# Patient Record
Sex: Male | Born: 1965 | Race: White | Hispanic: No | Marital: Married | Smoking: Never smoker
Health system: Southern US, Community
[De-identification: ages and names within clinical notes are randomized; demographics above are authoritative.]

## PROBLEM LIST (undated history)

## (undated) DIAGNOSIS — N189 Chronic kidney disease, unspecified: Secondary | ICD-10-CM

## (undated) DIAGNOSIS — K219 Gastro-esophageal reflux disease without esophagitis: Secondary | ICD-10-CM

## (undated) DIAGNOSIS — S065X9A Traumatic subdural hemorrhage with loss of consciousness of unspecified duration, initial encounter: Secondary | ICD-10-CM

## (undated) DIAGNOSIS — I10 Essential (primary) hypertension: Secondary | ICD-10-CM

## (undated) DIAGNOSIS — IMO0001 Reserved for inherently not codable concepts without codable children: Secondary | ICD-10-CM

## (undated) DIAGNOSIS — M1990 Unspecified osteoarthritis, unspecified site: Secondary | ICD-10-CM

---

## 1989-09-17 DIAGNOSIS — S065XAA Traumatic subdural hemorrhage with loss of consciousness status unknown, initial encounter: Secondary | ICD-10-CM

## 1989-09-17 DIAGNOSIS — S065X9A Traumatic subdural hemorrhage with loss of consciousness of unspecified duration, initial encounter: Secondary | ICD-10-CM

## 1989-09-17 HISTORY — DX: Traumatic subdural hemorrhage with loss of consciousness status unknown, initial encounter: S06.5XAA

## 1989-09-17 HISTORY — DX: Traumatic subdural hemorrhage with loss of consciousness of unspecified duration, initial encounter: S06.5X9A

## 1990-09-17 HISTORY — PX: OTHER SURGICAL HISTORY: SHX169

## 2007-10-31 ENCOUNTER — Ambulatory Visit: Payer: Self-pay | Admitting: Podiatry

## 2010-11-06 ENCOUNTER — Ambulatory Visit: Payer: Self-pay | Admitting: Unknown Physician Specialty

## 2012-01-01 ENCOUNTER — Ambulatory Visit: Payer: Self-pay | Admitting: Unknown Physician Specialty

## 2013-07-22 ENCOUNTER — Ambulatory Visit: Payer: Self-pay | Admitting: Family Medicine

## 2013-07-28 ENCOUNTER — Encounter: Payer: Self-pay | Admitting: Family Medicine

## 2013-08-05 ENCOUNTER — Other Ambulatory Visit (HOSPITAL_COMMUNITY): Payer: Self-pay | Admitting: Neurosurgery

## 2013-08-05 DIAGNOSIS — M47817 Spondylosis without myelopathy or radiculopathy, lumbosacral region: Secondary | ICD-10-CM

## 2013-08-06 ENCOUNTER — Ambulatory Visit (HOSPITAL_COMMUNITY)
Admission: RE | Admit: 2013-08-06 | Discharge: 2013-08-06 | Disposition: A | Discharge status: Home / Self Care | Payer: 59 | Source: Ambulatory Visit | Attending: Neurosurgery | Admitting: Neurosurgery

## 2013-08-06 DIAGNOSIS — M5126 Other intervertebral disc displacement, lumbar region: Secondary | ICD-10-CM | POA: Insufficient documentation

## 2013-08-06 DIAGNOSIS — M545 Low back pain, unspecified: Secondary | ICD-10-CM | POA: Insufficient documentation

## 2013-08-06 DIAGNOSIS — M5137 Other intervertebral disc degeneration, lumbosacral region: Secondary | ICD-10-CM | POA: Insufficient documentation

## 2013-08-06 DIAGNOSIS — M47817 Spondylosis without myelopathy or radiculopathy, lumbosacral region: Secondary | ICD-10-CM | POA: Insufficient documentation

## 2013-08-06 DIAGNOSIS — M51379 Other intervertebral disc degeneration, lumbosacral region without mention of lumbar back pain or lower extremity pain: Secondary | ICD-10-CM | POA: Insufficient documentation

## 2013-08-06 DIAGNOSIS — M79609 Pain in unspecified limb: Secondary | ICD-10-CM | POA: Insufficient documentation

## 2013-08-17 ENCOUNTER — Encounter: Payer: Self-pay | Admitting: Family Medicine

## 2013-08-18 ENCOUNTER — Other Ambulatory Visit (HOSPITAL_COMMUNITY): Payer: Self-pay

## 2014-09-17 DIAGNOSIS — N189 Chronic kidney disease, unspecified: Secondary | ICD-10-CM

## 2014-09-17 HISTORY — DX: Chronic kidney disease, unspecified: N18.9

## 2014-09-25 ENCOUNTER — Ambulatory Visit: Payer: Self-pay

## 2015-03-14 ENCOUNTER — Encounter: Payer: Self-pay | Admitting: Emergency Medicine

## 2015-03-14 ENCOUNTER — Ambulatory Visit
Admission: EM | Admit: 2015-03-14 | Discharge: 2015-03-14 | Disposition: A | Discharge status: Home / Self Care | Payer: 59 | Attending: Internal Medicine | Admitting: Internal Medicine

## 2015-03-14 DIAGNOSIS — J069 Acute upper respiratory infection, unspecified: Secondary | ICD-10-CM

## 2015-03-14 DIAGNOSIS — J029 Acute pharyngitis, unspecified: Secondary | ICD-10-CM | POA: Diagnosis present

## 2015-03-14 HISTORY — DX: Reserved for inherently not codable concepts without codable children: IMO0001

## 2015-03-14 HISTORY — DX: Gastro-esophageal reflux disease without esophagitis: K21.9

## 2015-03-14 LAB — RAPID STREP SCREEN (MED CTR MEBANE ONLY): Streptococcus, Group A Screen (Direct): NEGATIVE

## 2015-03-14 NOTE — Discharge Instructions (Signed)
Recheck for new fever>100.5 or if symptoms are not gradually improving over the next 10-14 days.  Upper Respiratory Infection, Adult An upper respiratory infection (URI) is also sometimes known as the common cold. The upper respiratory tract includes the nose, sinuses, throat, trachea, and bronchi. Bronchi are the airways leading to the lungs. Most people improve within 1 week, but symptoms can last up to 2 weeks. A residual cough may last even longer.  CAUSES Many different viruses can infect the tissues lining the upper respiratory tract. The tissues become irritated and inflamed and often become very moist. Mucus production is also common. A cold is contagious. You can easily spread the virus to others by oral contact. This includes kissing, sharing a glass, coughing, or sneezing. Touching your mouth or nose and then touching a surface, which is then touched by another person, can also spread the virus. SYMPTOMS  Symptoms typically develop 1 to 3 days after you come in contact with a cold virus. Symptoms vary from person to person. They may include:  Runny nose.  Sneezing.  Nasal congestion.  Sinus irritation.  Sore throat.  Loss of voice (laryngitis).  Cough.  Fatigue.  Muscle aches.  Loss of appetite.  Headache.  Low-grade fever. DIAGNOSIS  You might diagnose your own cold based on familiar symptoms, since most people get a cold 2 to 3 times a year. Your caregiver can confirm this based on your exam. Most importantly, your caregiver can check that your symptoms are not due to another disease such as strep throat, sinusitis, pneumonia, asthma, or epiglottitis. Blood tests, throat tests, and X-rays are not necessary to diagnose a common cold, but they may sometimes be helpful in excluding other more serious diseases. Your caregiver will decide if any further tests are required. RISKS AND COMPLICATIONS  You may be at risk for a more severe case of the common cold if you smoke  cigarettes, have chronic heart disease (such as heart failure) or lung disease (such as asthma), or if you have a weakened immune system. The very young and very old are also at risk for more serious infections. Bacterial sinusitis, middle ear infections, and bacterial pneumonia can complicate the common cold. The common cold can worsen asthma and chronic obstructive pulmonary disease (COPD). Sometimes, these complications can require emergency medical care and may be life-threatening. PREVENTION  The best way to protect against getting a cold is to practice good hygiene. Avoid oral or hand contact with people with cold symptoms. Wash your hands often if contact occurs. There is no clear evidence that vitamin C, vitamin E, echinacea, or exercise reduces the chance of developing a cold. However, it is always recommended to get plenty of rest and practice good nutrition. TREATMENT  Treatment is directed at relieving symptoms. There is no cure. Antibiotics are not effective, because the infection is caused by a virus, not by bacteria. Treatment may include:  Increased fluid intake. Sports drinks offer valuable electrolytes, sugars, and fluids.  Breathing heated mist or steam (vaporizer or shower).  Eating chicken soup or other clear broths, and maintaining good nutrition.  Getting plenty of rest.  Using gargles or lozenges for comfort.  Controlling fevers with ibuprofen or acetaminophen as directed by your caregiver.  Increasing usage of your inhaler if you have asthma. Zinc gel and zinc lozenges, taken in the first 24 hours of the common cold, can shorten the duration and lessen the severity of symptoms. Pain medicines may help with fever, muscle aches, and  throat pain. A variety of non-prescription medicines are available to treat congestion and runny nose. Your caregiver can make recommendations and may suggest nasal or lung inhalers for other symptoms.  HOME CARE INSTRUCTIONS   Only take  over-the-counter or prescription medicines for pain, discomfort, or fever as directed by your caregiver.  Use a warm mist humidifier or inhale steam from a shower to increase air moisture. This may keep secretions moist and make it easier to breathe.  Drink enough water and fluids to keep your urine clear or pale yellow.  Rest as needed.  Return to work when your temperature has returned to normal or as your caregiver advises. You may need to stay home longer to avoid infecting others. You can also use a face mask and careful hand washing to prevent spread of the virus. SEEK MEDICAL CARE IF:   After the first few days, you feel you are getting worse rather than better.  You need your caregiver's advice about medicines to control symptoms.  You develop chills, worsening shortness of breath, or brown or red sputum. These may be signs of pneumonia.  You develop yellow or brown nasal discharge or pain in the face, especially when you bend forward. These may be signs of sinusitis.  You develop a fever, swollen neck glands, pain with swallowing, or white areas in the back of your throat. These may be signs of strep throat. SEEK IMMEDIATE MEDICAL CARE IF:   You have a fever.  You develop severe or persistent headache, ear pain, sinus pain, or chest pain.  You develop wheezing, a prolonged cough, cough up blood, or have a change in your usual mucus (if you have chronic lung disease).  You develop sore muscles or a stiff neck. Document Released: 02/27/2001 Document Revised: 11/26/2011 Document Reviewed: 12/09/2013 [facility] Patient Information 2015 [place], [place]. This information is not intended to replace advice given to you by your health care provider. Make sure you discuss any questions you have with your health care provider.

## 2015-03-14 NOTE — ED Notes (Signed)
Sore throat, nausea, bodyache started today

## 2015-03-14 NOTE — ED Provider Notes (Signed)
CSN: [id number]     Arrival date & time 03/14/15  1704 History   First MD Initiated Contact with Patient 03/14/15 1807     Chief Complaint  Patient presents with  . Sore Throat   HPI   Patient is a 49 year old gentleman with the onset of malaise and mild sore throat today. When his kids has had a respiratory infection. Afebrile. Mild nausea. Some achiness. No significant runny/congested nose, no cough. Not vomiting.  Past Medical History  Diagnosis Date  . Reflux    Past Surgical History  Procedure Laterality Date  . Back surgery     History reviewed. No pertinent family history. History  Substance Use Topics  . Smoking status: Former Research scientist (life sciences)  . Smokeless tobacco: Never Used  . Alcohol Use: Yes     Comment: occasional    Review of Systems  All other systems reviewed and are negative.   Allergies  Review of patient's allergies indicates no known allergies.  Home Medications   Prior to Admission medications   Medication Sig Start Date End Date Taking? Authorizing Provider  omeprazole (PRILOSEC) 40 MG capsule Take 40 mg by mouth daily.   Yes Historical Provider, MD   BP 116/68 mmHg  Pulse 79  Temp(Src) 98.3 F (36.8 C) (Tympanic)  Resp 16  Ht 6\' 1"  (1.854 m)  Wt 190 lb (86.183 kg)  BMI 25.07 kg/m2  SpO2 97% Physical Exam  Constitutional: He is oriented to person, place, and time. No distress.  Alert, nicely groomed  HENT:  Head: Atraumatic.  Bilateral TMs mildly dull, right TM slightly red. Moderate nasal congestion on the left, marked on the right. Mucopurulent material present on the right. Throat red.  Eyes:  Conjugate gaze, no eye redness/drainage  Neck: Neck supple.  Cardiovascular: Normal rate and regular rhythm.   Pulmonary/Chest: No respiratory distress. He has no wheezes. He has no rales.  Lungs clear, symmetric breath sounds  Abdominal: Soft. He exhibits no distension.  Musculoskeletal: Normal range of motion.  Neurological: He is alert and  oriented to person, place, and time.  Skin: Skin is warm and dry.  No cyanosis  Nursing note and vitals reviewed.   ED Course  Procedures    Results for orders placed or performed during the hospital encounter of 03/14/15  Rapid strep screen  Result Value Ref Range   Streptococcus, Group A Screen (Direct) NEGATIVE NEGATIVE   Throat culture is pending.     MDM   1. Upper respiratory infection    Await throat culture results. Recheck for new fever greater than 100.5, increasing phlegm production, persistence of symptoms for greater than 10 days.    Sherlene Shams, MD 03/14/15 2128

## 2015-03-17 LAB — CULTURE, GROUP A STREP (THRC)

## 2015-05-16 ENCOUNTER — Emergency Department: Payer: 59

## 2015-05-16 ENCOUNTER — Inpatient Hospital Stay
Admission: EM | Admit: 2015-05-16 | Discharge: 2015-05-18 | DRG: 700 | Disposition: A | Discharge status: Home / Self Care | Payer: 59 | Attending: Internal Medicine | Admitting: Internal Medicine

## 2015-05-16 ENCOUNTER — Inpatient Hospital Stay
Admit: 2015-05-16 | Discharge: 2015-05-16 | Disposition: A | Discharge status: Home / Self Care | Payer: 59 | Attending: Specialist | Admitting: Specialist

## 2015-05-16 ENCOUNTER — Encounter: Payer: Self-pay | Admitting: Emergency Medicine

## 2015-05-16 DIAGNOSIS — D72829 Elevated white blood cell count, unspecified: Secondary | ICD-10-CM | POA: Diagnosis present

## 2015-05-16 DIAGNOSIS — K219 Gastro-esophageal reflux disease without esophagitis: Secondary | ICD-10-CM | POA: Diagnosis present

## 2015-05-16 DIAGNOSIS — R61 Generalized hyperhidrosis: Secondary | ICD-10-CM | POA: Diagnosis present

## 2015-05-16 DIAGNOSIS — Z79899 Other long term (current) drug therapy: Secondary | ICD-10-CM

## 2015-05-16 DIAGNOSIS — N280 Ischemia and infarction of kidney: Principal | ICD-10-CM | POA: Diagnosis present

## 2015-05-16 DIAGNOSIS — Z809 Family history of malignant neoplasm, unspecified: Secondary | ICD-10-CM | POA: Diagnosis not present

## 2015-05-16 DIAGNOSIS — Z9889 Other specified postprocedural states: Secondary | ICD-10-CM | POA: Diagnosis not present

## 2015-05-16 HISTORY — DX: Gastro-esophageal reflux disease without esophagitis: K21.9

## 2015-05-16 LAB — COMPREHENSIVE METABOLIC PANEL
ALT: 19 U/L (ref 17–63)
ANION GAP: 8 (ref 5–15)
AST: 22 U/L (ref 15–41)
Albumin: 4.4 g/dL (ref 3.5–5.0)
Alkaline Phosphatase: 66 U/L (ref 38–126)
BILIRUBIN TOTAL: 1 mg/dL (ref 0.3–1.2)
BUN: 23 mg/dL — AB (ref 6–20)
CHLORIDE: 102 mmol/L (ref 101–111)
CO2: 25 mmol/L (ref 22–32)
Calcium: 9.1 mg/dL (ref 8.9–10.3)
Creatinine, Ser: 1.33 mg/dL — ABNORMAL HIGH (ref 0.61–1.24)
Glucose, Bld: 117 mg/dL — ABNORMAL HIGH (ref 65–99)
POTASSIUM: 3.7 mmol/L (ref 3.5–5.1)
Sodium: 135 mmol/L (ref 135–145)
TOTAL PROTEIN: 6.8 g/dL (ref 6.5–8.1)

## 2015-05-16 LAB — CBC WITH DIFFERENTIAL/PLATELET
BASOS ABS: 0.1 10*3/uL (ref 0–0.1)
Basophils Relative: 1 %
EOS PCT: 0 %
Eosinophils Absolute: 0 10*3/uL (ref 0–0.7)
HCT: 46.2 % (ref 40.0–52.0)
Hemoglobin: 15.6 g/dL (ref 13.0–18.0)
LYMPHS PCT: 10 %
Lymphs Abs: 1.3 10*3/uL (ref 1.0–3.6)
MCH: 31.4 pg (ref 26.0–34.0)
MCHC: 33.7 g/dL (ref 32.0–36.0)
MCV: 93.2 fL (ref 80.0–100.0)
MONO ABS: 0.6 10*3/uL (ref 0.2–1.0)
MONOS PCT: 5 %
Neutro Abs: 11.1 10*3/uL — ABNORMAL HIGH (ref 1.4–6.5)
Neutrophils Relative %: 84 %
PLATELETS: 204 10*3/uL (ref 150–440)
RBC: 4.96 MIL/uL (ref 4.40–5.90)
RDW: 11.8 % (ref 11.5–14.5)
WBC: 13.1 10*3/uL — ABNORMAL HIGH (ref 3.8–10.6)

## 2015-05-16 LAB — URINALYSIS COMPLETE WITH MICROSCOPIC (ARMC ONLY)
BILIRUBIN URINE: NEGATIVE
Bacteria, UA: NONE SEEN
Glucose, UA: NEGATIVE mg/dL
HGB URINE DIPSTICK: NEGATIVE
KETONES UR: NEGATIVE mg/dL
LEUKOCYTES UA: NEGATIVE
NITRITE: NEGATIVE
PH: 7 (ref 5.0–8.0)
PROTEIN: NEGATIVE mg/dL
SPECIFIC GRAVITY, URINE: 1.019 (ref 1.005–1.030)
Squamous Epithelial / LPF: NONE SEEN

## 2015-05-16 LAB — LIPASE, BLOOD: LIPASE: 37 U/L (ref 22–51)

## 2015-05-16 LAB — HEPARIN LEVEL (UNFRACTIONATED): Heparin Unfractionated: 0.3 IU/mL (ref 0.30–0.70)

## 2015-05-16 LAB — PROTIME-INR
INR: 0.98
Prothrombin Time: 13.2 seconds (ref 11.4–15.0)

## 2015-05-16 LAB — APTT: aPTT: 29 seconds (ref 24–36)

## 2015-05-16 MED ORDER — ONDANSETRON HCL 4 MG/2ML IJ SOLN
4.0000 mg | Freq: Once | INTRAMUSCULAR | Status: AC
  Administered 2015-05-16: 4 mg via INTRAVENOUS

## 2015-05-16 MED ORDER — ACETAMINOPHEN 325 MG PO TABS: 650.0000 mg | ORAL_TABLET | Freq: Four times a day (QID) | ORAL | Status: DC | PRN

## 2015-05-16 MED ORDER — PANTOPRAZOLE SODIUM 40 MG PO TBEC
40.0000 mg | DELAYED_RELEASE_TABLET | Freq: Every day | ORAL | Status: DC
  Administered 2015-05-17: 40 mg via ORAL
  Filled 2015-05-16 (×2): qty 1

## 2015-05-16 MED ORDER — MORPHINE SULFATE (PF) 4 MG/ML IV SOLN
4.0000 mg | Freq: Once | INTRAVENOUS | Status: AC
  Administered 2015-05-16: 4 mg via INTRAVENOUS
  Filled 2015-05-16: qty 1

## 2015-05-16 MED ORDER — ONDANSETRON HCL 4 MG/2ML IJ SOLN
4.0000 mg | Freq: Four times a day (QID) | INTRAMUSCULAR | Status: DC | PRN
  Administered 2015-05-17: 4 mg via INTRAVENOUS
  Filled 2015-05-16: qty 2

## 2015-05-16 MED ORDER — ACETAMINOPHEN 650 MG RE SUPP: 650.0000 mg | Freq: Four times a day (QID) | RECTAL | Status: DC | PRN

## 2015-05-16 MED ORDER — SODIUM CHLORIDE 0.9 % IV SOLN
INTRAVENOUS | Status: DC
  Administered 2015-05-16 – 2015-05-18 (×4): via INTRAVENOUS

## 2015-05-16 MED ORDER — HEPARIN (PORCINE) IN NACL 100-0.45 UNIT/ML-% IJ SOLN
1000.0000 [IU]/h | INTRAMUSCULAR | Status: DC
  Administered 2015-05-17 (×2): 1000 [IU]/h via INTRAVENOUS
  Filled 2015-05-16: qty 250

## 2015-05-16 MED ORDER — MORPHINE SULFATE (PF) 2 MG/ML IV SOLN: 2.0000 mg | INTRAVENOUS | Status: DC | PRN

## 2015-05-16 MED ORDER — HEPARIN (PORCINE) IN NACL 100-0.45 UNIT/ML-% IJ SOLN
1000.0000 [IU]/h | Freq: Once | INTRAMUSCULAR | Status: AC
  Administered 2015-05-16: 1000 [IU]/h via INTRAVENOUS
  Filled 2015-05-16: qty 250

## 2015-05-16 MED ORDER — ONDANSETRON HCL 4 MG PO TABS: 4.0000 mg | ORAL_TABLET | Freq: Four times a day (QID) | ORAL | Status: DC | PRN

## 2015-05-16 MED ORDER — SODIUM CHLORIDE 0.9 % IV SOLN
1000.0000 mL | Freq: Once | INTRAVENOUS | Status: AC
  Administered 2015-05-16: 1000 mL via INTRAVENOUS

## 2015-05-16 MED ORDER — ONDANSETRON HCL 4 MG/2ML IJ SOLN
4.0000 mg | Freq: Once | INTRAMUSCULAR | Status: AC
  Administered 2015-05-16: 4 mg via INTRAVENOUS
  Filled 2015-05-16: qty 2

## 2015-05-16 MED ORDER — IOHEXOL 240 MG/ML SOLN: 25.0000 mL | Freq: Once | INTRAMUSCULAR | Status: DC | PRN

## 2015-05-16 MED ORDER — IOHEXOL 300 MG/ML  SOLN
100.0000 mL | Freq: Once | INTRAMUSCULAR | Status: AC | PRN
  Administered 2015-05-16: 100 mL via INTRAVENOUS

## 2015-05-16 MED ORDER — ONDANSETRON HCL 4 MG/2ML IJ SOLN
INTRAMUSCULAR | Status: AC
  Administered 2015-05-16: 4 mg via INTRAVENOUS
  Filled 2015-05-16: qty 2

## 2015-05-16 NOTE — Consult Note (Signed)
[facility] Consult Note  MRN : [id number]  Johnny Robinson is a 49 y.o. (09-08-1966) male who presents with chief complaint of  Chief Complaint  Patient presents with  . Abdominal Pain  .  History of Present Illness: The patient presents with abrupt onset of nausea and diaphoresis with right sided abdominal pain.  This happened at about 9 am today, and then again about 3.  No previous episodes.  No previous history of kidney problems or clotting issues to his knowledge.  He has no heart arrhythmias to his knowledge.  No trauma or injury.  He had a CT scan done today. This demonstrates a right renal infarct to the superior portion of the right kidney occupying about 10% of the kidney.  I have independently reviewed the CT scan, and there is a luminal abnormality in the right renal artery beyond the primary branches.  Whether or not this is an embolus or thrombus from a dissection is very difficult to tell.  There is minimal atherosclerosis anywhere, and the remainder of the perfusion appears intact.     Current Facility-Administered Medications  Medication Dose Route Frequency Provider Last Rate Last Dose  . iohexol (OMNIPAQUE) 240 MG/ML injection 25 mL  25 mL Oral Once PRN Medication Radiologist, MD       Current Outpatient Prescriptions  Medication Sig Dispense Refill  . omeprazole (PRILOSEC) 20 MG capsule Take 20 mg by mouth daily.      Past Medical History  Diagnosis Date  . Reflux   . GERD (gastroesophageal reflux disease)     Past Surgical History  Procedure Laterality Date  . Back surgery      Social History Social History  Substance Use Topics  . Smoking status: Never Smoker   . Smokeless tobacco: Never Used  . Alcohol Use: Yes     Comment: occasional  No IVDU  Family History Family History  Problem Relation Age of Onset  . Nephrolithiasis Mother   . Cancer Father   no bleeding disorders or clotting disorders known  No Known  Allergies   REVIEW OF SYSTEMS (Negative unless checked)  Constitutional: [] Weight loss  [] Fever  [] Chills Cardiac: [] Chest pain   [] Chest pressure   [] Palpitations   [] Shortness of breath when laying flat   [] Shortness of breath at rest   [] Shortness of breath with exertion. Vascular:  [] Pain in legs with walking   [] Pain in legs at rest   [] Pain in legs when laying flat   [] Claudication   [] Pain in feet when walking  [] Pain in feet at rest  [] Pain in feet when laying flat   [] History of DVT   [] Phlebitis   [] Swelling in legs   [] Varicose veins   [] Non-healing ulcers Pulmonary:   [] Uses home oxygen   [] Productive cough   [] Hemoptysis   [] Wheeze  [] COPD   [] Asthma Neurologic:  [] Dizziness  [] Blackouts   [] Seizures   [] History of stroke   [] History of TIA  [] Aphasia   [] Temporary blindness   [] Dysphagia   [] Weakness or numbness in arms   [] Weakness or numbness in legs Musculoskeletal:  [] Arthritis   [] Joint swelling   [] Joint pain   [] Low back pain Hematologic:  [] Easy bruising  [] Easy bleeding   [] Hypercoagulable state   [] Anemic  [] Hepatitis Gastrointestinal:  [] Blood in stool   [] Vomiting blood  [] Gastroesophageal reflux/heartburn   [] Difficulty swallowing. Genitourinary:  [] Chronic kidney disease   [] Difficult urination  [] Frequent urination  [] Burning with urination   []   Blood in urine Skin:  [] Rashes   [] Ulcers   [] Wounds Psychological:  [] History of anxiety   []  History of major depression.  Physical Examination  Filed Vitals:   05/16/15 1200 05/16/15 1400 05/16/15 1430 05/16/15 1544  BP: 121/81 123/69 117/75 122/77  Pulse: 71 73 71 71  Temp:      Resp:    16  Height:      Weight:      SpO2: 98% 95% 96% 97%   Body mass index is 25.07 kg/(m^2). Gen:  WD/WN, NAD Head: [place]/AT, No temporalis wasting. Prominent temp pulse not noted. Ear/Nose/Throat: Hearing grossly intact, nares w/o erythema or drainage, oropharynx w/o Erythema/Exudate Eyes: PERRLA, EOMI.  Neck: Supple, no nuchal  rigidity.  No bruit or JVD.  Pulmonary:  Good air movement, no labored respirations Cardiac: RRR Vessel Right Left  Radial Palpable Palpable  Ulnar Palpable Palpable  Brachial Palpable Palpable  Carotid Palpable, without bruit Palpable, without bruit  Aorta Not palpable N/A  Femoral Palpable Palpable  Popliteal Palpable Palpable  PT Palpable Palpable  DP Palpable Palpable   Gastrointestinal: soft, non-tender/non-distended. No guarding/reflex. No masses, surgical incisions, or scars. Musculoskeletal: M/S 5/5 throughout.  Extremities without ischemic changes.  No deformity or atrophy. No edema. Neurologic: CN 2-12 intact. Pain and light touch intact in extremities.  Symmetrical.  Speech is fluent. Motor exam as listed above. Psychiatric: Judgment intact, Mood & affect appropriate for pt's clinical situation. Dermatologic: No rashes or ulcers noted.  No cellulitis or open wounds. Lymph : No Cervical, Axillary, or Inguinal lymphadenopathy.      CBC Lab Results  Component Value Date   WBC 13.1* 05/16/2015   HGB 15.6 05/16/2015   HCT 46.2 05/16/2015   MCV 93.2 05/16/2015   PLT 204 05/16/2015    BMET    Component Value Date/Time   NA 135 05/16/2015 1241   K 3.7 05/16/2015 1241   CL 102 05/16/2015 1241   CO2 25 05/16/2015 1241   GLUCOSE 117* 05/16/2015 1241   BUN 23* 05/16/2015 1241   CREATININE 1.33* 05/16/2015 1241   CALCIUM 9.1 05/16/2015 1241   GFRNONAA >60 05/16/2015 1241   GFRAA >60 05/16/2015 1241   Estimated Creatinine Clearance: 78.4 mL/min (by C-G formula based on Cr of 1.33).  COAG Lab Results  Component Value Date   INR 0.98 05/16/2015    Radiology Ct Abdomen Pelvis W Contrast  05/16/2015   CLINICAL DATA:  Abrupt onset severe right lower quadrant pain. Initial encounter.  EXAM: CT ABDOMEN AND PELVIS WITH CONTRAST  TECHNIQUE: Multidetector CT imaging of the abdomen and pelvis was performed using the standard protocol following bolus administration of  intravenous contrast.  CONTRAST:  199mL OMNIPAQUE IOHEXOL 300 MG/ML  SOLN  COMPARISON:  None.  FINDINGS: BODY WALL: No contributory findings.  LOWER CHEST: Calcified granulomas in the left lower lobe.  ABDOMEN/PELVIS:  Liver: No focal abnormality.  Biliary: No evidence of biliary obstruction or stone.  Pancreas: Unremarkable.  Spleen: Unremarkable.  Adrenals: Unremarkable.  Kidneys and ureters: There is a wedge-shaped well-defined area of nonenhancement involving the upper pole right kidney, likely capsular/ upper lobar branch occlusion (this vessel is relatively hypoenhancing). Additionally, there is focal luminal narrowing the level of the right renal artery bifurcation, near were the capsular branch arises, suggesting thrombus or focal dissection. No overt FMD changes, although not a CTA. No additional infarct noted.  Bladder: Unremarkable.  Reproductive: No pathologic findings.  Bowel: No obstruction. No appendicitis.  Retroperitoneum: No mass or  adenopathy.  Peritoneum: No ascites or pneumoperitoneum.  Vascular: No acute abnormality.  OSSEOUS: No acute abnormalities. Extensive degenerative disc disease and facet arthropathy for age, focally advanced at L2-3 where there is retrolisthesis and advanced disc narrowing.  These results were called by telephone at the time of interpretation on 05/16/2015 at 2:20 pm to Dr. Lenise Arena , who verbally acknowledged these results.  IMPRESSION: Abnormal upper pole pole right kidney most consistent with renal infarct (pyelonephritis can have a similar appearance and UA recommended). Focal right renal artery bifurcation luminal narrowing suspicious for dissection/thrombosis, with capsular branch involvement explaining the infarct. Based on location and patient age, question underlying fibromuscular dysplasia. Renal CTA could further evaluate as clinically indicated.   Electronically Signed   By: Monte Fantasia M.D.   On: 05/16/2015 14:23     Assessment/Plan 1.  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  I have independently reviewed the CT scan, and there is a luminal abnormality in the right renal artery beyond the primary branches.  Whether or not this is an embolus or thrombus from a dissection is very difficult to tell.  There is minimal atherosclerosis anywhere, and the remainder of the perfusion appears intact.  I have had a long talk today with he and his wife about the situation. He has a spontaneous renal infarct in the source is not entirely clear. Embolic disease is the most common cause of this, and for this reason I have discussed with the admitting physician that the patient should be placed on telemetry and have an echocardiogram performed. I would also recommend a CT scan of the chest to evaluate for dissection, aneurysm, or other possible causes of embolic material. Spontaneous dissection is certainly a possibility. Fibromuscular dysplasia is extremely uncommon in males, but this is a possibility. He does not have a history of renovascular hypertension or difficult to control blood pressure which would make this less likely as well. Either way, he should be placed on anticoagulation to preserve his perfusion and avoid progression of thrombus. Hydration will be important as he will receive several dye loads over the next few days. Pending the results of his other studies, a renal angiogram may be of benefit as well and we have discussed this in detail today. It is very unlikely that we will have any intervention that would be performed, but a renal angiogram may be of help in determining a cause. I discussed with he and his wife the situation and they seem agreeable with our plan of care.   Damisha Wolff, MD  05/16/2015 5:13 PM

## 2015-05-16 NOTE — ED Provider Notes (Signed)
[facility] Emergency Department Provider Note     Time seen: ----------------------------------------- 11:29 AM on 05/16/2015 -----------------------------------------    I have reviewed the triage vital signs and the nursing notes.   HISTORY  Chief Complaint Abdominal Pain    HPI Johnny Robinson is a 49 y.o. male who presents to ER with right sided abdominal pain that started today. He was acute in onset, more sharp. Nothing made it better or worse, did have some associated nausea and sweats. States he cannot get comfortable when it started. He's not had any fevers chills or other complaints. No history of kidney stones.   Past Medical History  Diagnosis Date  . Reflux     There are no active problems to display for this patient.   Past Surgical History  Procedure Laterality Date  . Back surgery      Allergies Review of patient's allergies indicates no known allergies.  Social History Social History  Substance Use Topics  . Smoking status: Former Research scientist (life sciences)  . Smokeless tobacco: Never Used  . Alcohol Use: Yes     Comment: occasional    Review of Systems Constitutional: Negative for fever. Eyes: Negative for visual changes. ENT: Negative for sore throat. Cardiovascular: Negative for chest pain. Respiratory: Negative for shortness of breath. Gastrointestinal: Positive for abdominal pain and nausea Genitourinary: Negative for dysuria. Musculoskeletal: Negative for back pain. Skin: Positive for sweats Neurological: Negative for headaches, focal weakness or numbness.  10-point ROS otherwise negative.  ____________________________________________   PHYSICAL EXAM:  VITAL SIGNS: ED Triage Vitals  Enc Vitals Group     BP 05/16/15 1119 118/77 mmHg     Pulse Rate 05/16/15 1119 74     Resp 05/16/15 1119 20     Temp 05/16/15 1119 97.9 F (36.6 C)     Temp src --      SpO2 05/16/15 1119 97 %     Weight 05/16/15 1119 190 lb (86.183 kg)      Height 05/16/15 1119 6\' 1"  (1.854 m)     Head Cir --      Peak Flow --      Pain Score 05/16/15 1119 4     Pain Loc --      Pain Edu? --      Excl. in [place]? --     Constitutional: Alert and oriented. Well appearing and in no distress. Eyes: Conjunctivae are normal. PERRL. Normal extraocular movements. ENT   Head: Normocephalic and atraumatic.   Nose: No congestion/rhinnorhea.   Mouth/Throat: Mucous membranes are moist.   Neck: No stridor. Cardiovascular: Normal rate, regular rhythm. Normal and symmetric distal pulses are present in all extremities. No murmurs, rubs, or gallops. Respiratory: Normal respiratory effort without tachypnea nor retractions. Breath sounds are clear and equal bilaterally. No wheezes/rales/rhonchi. Gastrointestinal: There is right-sided abdominal tenderness, there is pain in McBurney's point and superior to this as well the right flank and right mid quadrant area normal bowel sounds. Musculoskeletal: Nontender with normal range of motion in all extremities. No joint effusions.  No lower extremity tenderness nor edema. Neurologic:  Normal speech and language. No gross focal neurologic deficits are appreciated. Speech is normal. No gait instability. Skin:  Skin is warm, dry and intact. No rash noted. Psychiatric: Mood and affect are normal. Speech and behavior are normal. Patient exhibits appropriate insight and judgment.  ____________________________________________  ED COURSE:  Pertinent labs & imaging results that were available during my care of the patient were reviewed by  me and considered in my medical decision making (see chart for details). Patient is no acute distress, likely either appendicitis or kidney stone.  EKG: Interpreted by me, normal sinus rhythm with possible left atrial enlargement, normal axis and intervals. No evidence of acute infarction. Rate is 70 bpm ____________________________________________    LABS (pertinent  positives/negatives)  Labs Reviewed  CBC WITH DIFFERENTIAL/PLATELET - Abnormal; Notable for the following:    WBC 13.1 (*)    Neutro Abs 11.1 (*)    All other components within normal limits  COMPREHENSIVE METABOLIC PANEL - Abnormal; Notable for the following:    Glucose, Bld 117 (*)    BUN 23 (*)    Creatinine, Ser 1.33 (*)    All other components within normal limits  LIPASE, BLOOD  URINALYSIS COMPLETEWITH MICROSCOPIC ([facility] ONLY)  APTT  PROTIME-INR    RADIOLOGY Images were viewed by me  CT abd/pelvis with contrast   IMPRESSION: Abnormal upper pole pole right kidney most consistent with renal infarct (pyelonephritis can have a similar appearance and UA recommended). Focal right renal artery bifurcation luminal narrowing suspicious for dissection/thrombosis, with capsular branch involvement explaining the infarct. Based on location and patient age, question underlying fibromuscular dysplasia. Renal CTA could further evaluate as clinically indicated.  ____________________________________________  FINAL ASSESSMENT AND PLAN  Right lower quadrant abdominal pain, renal infarct  Plan: Patient with labs and imaging as dictated above. Case was discussed with vascular surgery, patient will be placed on anticoagulants and admitted. Likely will receive CT of the chest with contrast. Unclear why he had an embolic or likely embolic event today. Heparin has been ordered, he is currently asymptomatic.   Earleen Newport, MD   Earleen Newport, MD 05/16/15 [phone]

## 2015-05-16 NOTE — H&P (Signed)
[facility] NAME: Johnny Robinson    MR#:  [id number]  DATE OF BIRTH:  [birth year]  DATE OF ADMISSION:  05/16/2015  PRIMARY CARE PHYSICIAN: Idelle Crouch, MD   REQUESTING/REFERRING PHYSICIAN: Dr. Lenise Arena  CHIEF COMPLAINT:   Chief Complaint  Patient presents with  . Abdominal Pain    HISTORY OF PRESENT ILLNESS:  Johnny Robinson  is a 49 y.o. male with a known history of GERD who presents to the hospital with right sided abdominal pain. Patient was in usual state of health when at work he developed some diaphoresis and felt nauseated. He then developed worsening right-sided abdominal pain in the flank area. He came to the ER for further evaluation as he thought he may have had a kidney stone. Patient underwent a CT scan of his abdomen and pelvis with contrast which showed a right-sided renal infarct. Patient has no previous history of vascular disease or no history of atrial fibrillation. Hospitalist services were contacted for further treatment and evaluation.  PAST MEDICAL HISTORY:   Past Medical History  Diagnosis Date  . Reflux   . GERD (gastroesophageal reflux disease)     PAST SURGICAL HISTORY:   Past Surgical History  Procedure Laterality Date  . Back surgery      SOCIAL HISTORY:   Social History  Substance Use Topics  . Smoking status: Never Smoker   . Smokeless tobacco: Never Used  . Alcohol Use: Yes     Comment: occasional    FAMILY HISTORY:   Family History  Problem Relation Age of Onset  . Nephrolithiasis Mother   . Cancer Father     DRUG ALLERGIES:  No Known Allergies  REVIEW OF SYSTEMS:   Review of Systems  Constitutional: Negative for fever and weight loss.  HENT: Negative for congestion, nosebleeds and tinnitus.   Eyes: Negative for blurred vision, double vision and redness.  Respiratory: Negative for cough, hemoptysis and shortness of breath.   Cardiovascular: Negative for chest  pain, orthopnea, leg swelling and PND.  Gastrointestinal: Positive for nausea and abdominal pain (right flank). Negative for vomiting, diarrhea and melena.  Genitourinary: Negative for dysuria, urgency and hematuria.  Musculoskeletal: Negative for joint pain and falls.  Neurological: Negative for dizziness, tingling, sensory change, focal weakness, seizures, weakness and headaches.  Endo/Heme/Allergies: Negative for polydipsia. Does not bruise/bleed easily.  Psychiatric/Behavioral: Negative for depression and memory loss. The patient is not nervous/anxious.     MEDICATIONS AT HOME:   Prior to Admission medications   Medication Sig Start Date End Date Taking? Authorizing Provider  omeprazole (PRILOSEC) 20 MG capsule Take 20 mg by mouth daily.   Yes Historical Provider, MD      VITAL SIGNS:  Blood pressure 122/77, pulse 71, temperature 97.9 F (36.6 C), resp. rate 16, height 6\' 1"  (1.854 m), weight 86.183 kg (190 lb), SpO2 97 %.  PHYSICAL EXAMINATION:  Physical Exam  GENERAL:  49 y.o.-year-old patient lying in the bed with no acute distress.  EYES: Pupils equal, round, reactive to light and accommodation. No scleral icterus. Extraocular muscles intact.  HEENT: Head atraumatic, normocephalic. Oropharynx and nasopharynx clear. No oropharyngeal erythema, moist oral mucosa  NECK:  Supple, no jugular venous distention. No thyroid enlargement, no tenderness.  LUNGS: Normal breath sounds bilaterally, no wheezing, rales, rhonchi. No use of accessory muscles of respiration.  CARDIOVASCULAR: S1, S2 RRR. No murmurs, rubs, gallops, clicks.  ABDOMEN: Soft, tender in the right side of the  flank area, no rebound rigidity,  nondistended. Bowel sounds present. No organomegaly or mass.  EXTREMITIES: No pedal edema, cyanosis, or clubbing. + 2 pedal & radial pulses b/l.   NEUROLOGIC: Cranial nerves II through XII are intact. No focal Motor or sensory deficits appreciated b/l PSYCHIATRIC: The patient is  alert and oriented x 3. Good affect.  SKIN: No obvious rash, lesion, or ulcer.   LABORATORY PANEL:   CBC  Recent Labs Lab 05/16/15 1241  WBC 13.1*  HGB 15.6  HCT 46.2  PLT 204   ------------------------------------------------------------------------------------------------------------------  Chemistries   Recent Labs Lab 05/16/15 1241  NA 135  K 3.7  CL 102  CO2 25  GLUCOSE 117*  BUN 23*  CREATININE 1.33*  CALCIUM 9.1  AST 22  ALT 19  ALKPHOS 66  BILITOT 1.0   ------------------------------------------------------------------------------------------------------------------  Cardiac Enzymes No results for input(s): TROPONINI in the last 168 hours. ------------------------------------------------------------------------------------------------------------------  RADIOLOGY:  Ct Abdomen Pelvis W Contrast  05/16/2015   CLINICAL DATA:  Abrupt onset severe right lower quadrant pain. Initial encounter.  EXAM: CT ABDOMEN AND PELVIS WITH CONTRAST  TECHNIQUE: Multidetector CT imaging of the abdomen and pelvis was performed using the standard protocol following bolus administration of intravenous contrast.  CONTRAST:  155mL OMNIPAQUE IOHEXOL 300 MG/ML  SOLN  COMPARISON:  None.  FINDINGS: BODY WALL: No contributory findings.  LOWER CHEST: Calcified granulomas in the left lower lobe.  ABDOMEN/PELVIS:  Liver: No focal abnormality.  Biliary: No evidence of biliary obstruction or stone.  Pancreas: Unremarkable.  Spleen: Unremarkable.  Adrenals: Unremarkable.  Kidneys and ureters: There is a wedge-shaped well-defined area of nonenhancement involving the upper pole right kidney, likely capsular/ upper lobar branch occlusion (this vessel is relatively hypoenhancing). Additionally, there is focal luminal narrowing the level of the right renal artery bifurcation, near were the capsular branch arises, suggesting thrombus or focal dissection. No overt FMD changes, although not a CTA. No  additional infarct noted.  Bladder: Unremarkable.  Reproductive: No pathologic findings.  Bowel: No obstruction. No appendicitis.  Retroperitoneum: No mass or adenopathy.  Peritoneum: No ascites or pneumoperitoneum.  Vascular: No acute abnormality.  OSSEOUS: No acute abnormalities. Extensive degenerative disc disease and facet arthropathy for age, focally advanced at L2-3 where there is retrolisthesis and advanced disc narrowing.  These results were called by telephone at the time of interpretation on 05/16/2015 at 2:20 pm to Dr. Lenise Arena , who verbally acknowledged these results.  IMPRESSION: Abnormal upper pole pole right kidney most consistent with renal infarct (pyelonephritis can have a similar appearance and UA recommended). Focal right renal artery bifurcation luminal narrowing suspicious for dissection/thrombosis, with capsular branch involvement explaining the infarct. Based on location and patient age, question underlying fibromuscular dysplasia. Renal CTA could further evaluate as clinically indicated.   Electronically Signed   By: Monte Fantasia M.D.   On: 05/16/2015 14:23     IMPRESSION AND PLAN:   49 year old male with past medical history of GERD who presents to the hospital with right sided flank abdominal pain and noted to have a right renal infarct.  #1 right-sided renal infarct-this is likely the cause of patient's flank abdominal pain, nausea. -Continue supportive care with IV fluids, pain control. I will start the patient on a heparin nomogram. -We'll get a vascular surgery consult and discussed the case with Dr. Corene Cornea dew. -We will look for a embolic source and get a CT angiogram of the chest. Get a two-dimensional echocardiogram. Observe patient on telemetry to watch for any  arrhythmias specifically atrial fibrillation.  #2 leukocytosis-this is likely stress mediated from the renal infarct. We'll follow white cell count.  #3 GERD-continue Protonix.  The plan was  discussed in detail with the patient's family and also the patient and the vascular surgeon.  All the records are reviewed and case discussed with ED provider. Management plans discussed with the patient, family and they are in agreement.  CODE STATUS: Full  TOTAL TIME TAKING CARE OF THIS PATIENT: 50 minutes.    Henreitta Leber M.D on 05/16/2015 at 3:51 PM  Between 7am to 6pm - Pager - [phone]  After 6pm go to [email or web address] - password [email or web address] [facility]  Office  [phone]  CC: Primary care physician; Idelle Crouch, MD

## 2015-05-16 NOTE — Progress Notes (Signed)
ANTICOAGULATION CONSULT NOTE - Initial Consult  Pharmacy Consult for heparin Indication: renal thrombus   No Known Allergies  Patient Measurements: Height: 6\' 1"  (185.4 cm) Weight: 190 lb (86.183 kg) IBW/kg (Calculated) : 79.9 Heparin Dosing Weight: 86 kg  Vital Signs: Temp: 97.9 F (36.6 C) (08/29 1119) BP: 117/75 mmHg (08/29 1430) Pulse Rate: 71 (08/29 1430)  Labs:  Recent Labs  05/16/15 1241  HGB 15.6  HCT 46.2  PLT 204  CREATININE 1.33*    Estimated Creatinine Clearance: 78.4 mL/min (by C-G formula based on Cr of 1.33).  Assessment: Pharmacy consulted to monitor heparin for renal thrombus in this 49 year old male. Per MD to start heparin at 12 units/kg/hr with goal heparin level of 0.3-0.7 Baseline labs ordered, patient not on anticoagulants at home.   Goal of Therapy:  Heparin level 0.3-0.7 units/ml   Plan:  Ordered heparin 1000 units/hr (12units/kg/hr). Will check heparin level 6 hours after drip started.  Pharmacy to follow per consult  Rexene Edison, PharmD Clinical Pharmacist  05/16/2015,2:58 PM

## 2015-05-16 NOTE — Progress Notes (Signed)
Patient admitted to unit. Oriented to room, call bell, and staff. Bed in lowest position. Fall safety plan reviewed. Full assessment to Epic. Skin assessment verified with Margette Fast RN. Telemetry box verification with tele clerk- Box#: [id number]. Will continue to monitor. No complaints of pain at this time.

## 2015-05-16 NOTE — ED Notes (Signed)
Dr. Lucky Cowboy is reportedly on his way to see pt in ED. Pt will remain in ED until after MD assessment.

## 2015-05-16 NOTE — ED Notes (Signed)
Pt presents with RUQ pain started today acute onset.

## 2015-05-16 NOTE — Progress Notes (Signed)
*  PRELIMINARY RESULTS* Echocardiogram 2D Echocardiogram has been performed.  Johnny Robinson 05/16/2015, 7:58 PM

## 2015-05-17 ENCOUNTER — Other Ambulatory Visit: Payer: 59

## 2015-05-17 ENCOUNTER — Inpatient Hospital Stay: Payer: 59

## 2015-05-17 LAB — BASIC METABOLIC PANEL
ANION GAP: 5 (ref 5–15)
BUN: 19 mg/dL (ref 6–20)
CHLORIDE: 107 mmol/L (ref 101–111)
CO2: 28 mmol/L (ref 22–32)
Calcium: 8.5 mg/dL — ABNORMAL LOW (ref 8.9–10.3)
Creatinine, Ser: 1.23 mg/dL (ref 0.61–1.24)
GFR calc non Af Amer: >60 mL/min (ref >60)
GLUCOSE: 112 mg/dL — AB (ref 65–99)
Potassium: 4 mmol/L (ref 3.5–5.1)
Sodium: 140 mmol/L (ref 135–145)

## 2015-05-17 LAB — CBC
HEMATOCRIT: 43.8 % (ref 40.0–52.0)
HEMOGLOBIN: 14.8 g/dL (ref 13.0–18.0)
MCH: 31.7 pg (ref 26.0–34.0)
MCHC: 33.7 g/dL (ref 32.0–36.0)
MCV: 94.1 fL (ref 80.0–100.0)
Platelets: 180 10*3/uL (ref 150–440)
RBC: 4.66 MIL/uL (ref 4.40–5.90)
RDW: 12.2 % (ref 11.5–14.5)
WBC: 9.1 10*3/uL (ref 3.8–10.6)

## 2015-05-17 LAB — HEPARIN LEVEL (UNFRACTIONATED): Heparin Unfractionated: 0.37 IU/mL (ref 0.30–0.70)

## 2015-05-17 MED ORDER — CHLORHEXIDINE GLUCONATE CLOTH 2 % EX PADS: 6.0000 | MEDICATED_PAD | Freq: Once | CUTANEOUS | Status: DC

## 2015-05-17 MED ORDER — SODIUM CHLORIDE 0.9 % IV SOLN
INTRAVENOUS | Status: DC
  Administered 2015-05-18: 08:00:00 via INTRAVENOUS

## 2015-05-17 MED ORDER — SODIUM CHLORIDE 0.9 % IJ SOLN
3.0000 mL | INTRAMUSCULAR | Status: DC | PRN
  Administered 2015-05-18: 3 mL via INTRAVENOUS
  Filled 2015-05-17: qty 10

## 2015-05-17 MED ORDER — IOHEXOL 350 MG/ML SOLN
75.0000 mL | Freq: Once | INTRAVENOUS | Status: AC | PRN
  Administered 2015-05-17: 75 mL via INTRAVENOUS

## 2015-05-17 NOTE — Progress Notes (Signed)
[facility]  Daily Progress Note   Subjective  - * No surgery date entered *  One episode of flushing and nausea. Other tests (Echo, CT) were unrevealing and I have independently reviewed the CT scans. Renal function improved.  Objective Filed Vitals:   05/16/15 1809 05/16/15 2002 05/17/15 0448 05/17/15 0800  BP:  133/72 125/68 123/71  Pulse:  69 71 74  Temp: 97.8 F (36.6 C) 98 F (36.7 C) 98.2 F (36.8 C) 98.4 F (36.9 C)  TempSrc: Oral Oral Oral   Resp:  18  18  Height:      Weight:      SpO2:  97% 98% 96%    Intake/Output Summary (Last 24 hours) at 05/17/15 1449 Last data filed at 05/17/15 0700  Gross per 24 hour  Intake 1738.34 ml  Output    320 ml  Net 1418.34 ml    PULM  CTAB CV  RRR  Laboratory CBC    Component Value Date/Time   WBC 9.1 05/17/2015 0426   HGB 14.8 05/17/2015 0426   HCT 43.8 05/17/2015 0426   PLT 180 05/17/2015 0426    BMET    Component Value Date/Time   NA 140 05/17/2015 0426   K 4.0 05/17/2015 0426   CL 107 05/17/2015 0426   CO2 28 05/17/2015 0426   GLUCOSE 112* 05/17/2015 0426   BUN 19 05/17/2015 0426   CREATININE 1.23 05/17/2015 0426   CALCIUM 8.5* 05/17/2015 0426   GFRNONAA >60 05/17/2015 0426   GFRAA >60 05/17/2015 0426    Assessment/Planning:    Right renal infarct.    No obvious source of embolic disease.  Echo normal, CT chest normal, no arrythmias  Discussed renal angiogram and will proceed with this tomorrow am  Risks and benefits discussed.  Likely can be discharged tomorrow afternoon after procedure.    Johnny Robinson  05/17/2015, 2:49 PM

## 2015-05-17 NOTE — Care Management (Signed)
Copay for Xarelto or Eliquis would be 75 dollars.  [facility] does accept the drug company discount cards for either.

## 2015-05-17 NOTE — Progress Notes (Signed)
ANTICOAGULATION CONSULT NOTE - Initial Consult  Pharmacy Consult for heparin Indication: renal thrombus   No Known Allergies  Patient Measurements: Height: 6\' 1"  (185.4 cm) Weight: 193 lb 11.2 oz (87.862 kg) IBW/kg (Calculated) : 79.9 Heparin Dosing Weight: 86 kg  Vital Signs: Temp: 98 F (36.7 C) (08/29 2002) Temp Source: Oral (08/29 2002) BP: 133/72 mmHg (08/29 2002) Pulse Rate: 69 (08/29 2002)  Labs:  Recent Labs  05/16/15 1241 05/16/15 2114  HGB 15.6  --   HCT 46.2  --   PLT 204  --   APTT 29  --   LABPROT 13.2  --   INR 0.98  --   HEPARINUNFRC  --  0.30  CREATININE 1.33*  --     Estimated Creatinine Clearance: 78.4 mL/min (by C-G formula based on Cr of 1.33).  Assessment: Pharmacy consulted to monitor heparin for renal thrombus in this 49 year old male. Per MD to start heparin at 12 units/kg/hr with goal heparin level of 0.3-0.7 Baseline labs ordered, patient not on anticoagulants at home.   Goal of Therapy:  Heparin level 0.3-0.7 units/ml   Plan:  Heparin level therapeutic at 0.3. Continue current rate. Will recheck level in 6 hours and if still therapeutic will switch to daily monitoring.  Pharmacy to follow per consult  Ovid Curd A. Jordan Hawks, PharmD Clinical Pharmacist  05/17/2015,12:06 AM

## 2015-05-17 NOTE — Progress Notes (Signed)
ANTICOAGULATION CONSULT NOTE - Initial Consult  Pharmacy Consult for heparin Indication: renal thrombus   No Known Allergies  Patient Measurements: Height: 6\' 1"  (185.4 cm) Weight: 193 lb 11.2 oz (87.862 kg) IBW/kg (Calculated) : 79.9 Heparin Dosing Weight: 86 kg  Vital Signs: Temp: 98.2 F (36.8 C) (08/30 0448) Temp Source: Oral (08/30 0448) BP: 125/68 mmHg (08/30 0448) Pulse Rate: 71 (08/30 0448)  Labs:  Recent Labs  05/16/15 1241 05/16/15 2114 05/17/15 0426  HGB 15.6  --  14.8  HCT 46.2  --  43.8  PLT 204  --  180  APTT 29  --   --   LABPROT 13.2  --   --   INR 0.98  --   --   HEPARINUNFRC  --  0.30 0.37  CREATININE 1.33*  --   --     Estimated Creatinine Clearance: 78.4 mL/min (by C-G formula based on Cr of 1.33).  Assessment: Pharmacy consulted to monitor heparin for renal thrombus in this 49 year old male. Per MD to start heparin at 12 units/kg/hr with goal heparin level of 0.3-0.7 Baseline labs ordered, patient not on anticoagulants at home.   Goal of Therapy:  Heparin level 0.3-0.7 units/ml   Plan:  Heparin level therapeutic at 0.37. Continue current rate. Will continue to monitor daily.  Pharmacy to follow per consult  Ovid Curd A. Jordan Hawks, PharmD Clinical Pharmacist  05/17/2015,4:52 AM

## 2015-05-17 NOTE — Care Management (Signed)
Will contact insurance to determine coverage for Xarelto and Eliquis

## 2015-05-17 NOTE — Progress Notes (Signed)
Johnny Robinson is a 49 y.o. male  <principal problem not specified>   SUBJECTIVE:  Pt admitted with abdominal pain due to right renal infarct. Pain improved today. On Heparin drip. Has been seen by Vascular. Echo results pending.  ______________________________________________________________________  ROS: Review of systems is unremarkable for any active cardiac,respiratory, GI, GU, hematologic, neurologic or psychiatric systems, 10 systems reviewed.  @CMEDLIST @  Past Medical History  Diagnosis Date  . Reflux   . GERD (gastroesophageal reflux disease)     Past Surgical History  Procedure Laterality Date  . Back surgery      PHYSICAL EXAM:  BP 125/68 mmHg  Pulse 71  Temp(Src) 98.2 F (36.8 C) (Oral)  Resp 18  Ht 6\' 1"  (1.854 m)  Wt 87.862 kg (193 lb 11.2 oz)  BMI 25.56 kg/m2  SpO2 98%  Wt Readings from Last 3 Encounters:  05/16/15 87.862 kg (193 lb 11.2 oz)  03/14/15 86.183 kg (190 lb)            Constitutional: NAD Neck: supple, no thyromegaly Respiratory: CTA, no rales or wheezes Cardiovascular: RRR, no murmur, no gallop Abdomen: soft, good BS, nontender Extremities: no edema Neuro: alert and oriented, no focal motor or sensory deficits  ASSESSMENT/PLAN:  Labs and imaging studies were reviewed  Ct angio chest/aorta today. F/u on echo results. Possible renal angiogram per Dr. Lucky Cowboy. Continue Heparin. Repeat labs in AM.

## 2015-05-18 ENCOUNTER — Encounter: Payer: Self-pay | Admitting: Vascular Surgery

## 2015-05-18 ENCOUNTER — Encounter
Admission: EM | Disposition: A | Discharge status: Home / Self Care | Payer: Self-pay | Source: Home / Self Care | Attending: Internal Medicine

## 2015-05-18 HISTORY — PX: PERIPHERAL VASCULAR CATHETERIZATION: SHX172C

## 2015-05-18 LAB — CBC WITH DIFFERENTIAL/PLATELET
BASOS ABS: 0 10*3/uL (ref 0–0.1)
BASOS PCT: 0 %
EOS ABS: 0.1 10*3/uL (ref 0–0.7)
EOS PCT: 2 %
HCT: 44.1 % (ref 40.0–52.0)
Hemoglobin: 14.9 g/dL (ref 13.0–18.0)
Lymphocytes Relative: 21 %
Lymphs Abs: 1.8 10*3/uL (ref 1.0–3.6)
MCH: 31.6 pg (ref 26.0–34.0)
MCHC: 33.9 g/dL (ref 32.0–36.0)
MCV: 93.5 fL (ref 80.0–100.0)
MONO ABS: 0.8 10*3/uL (ref 0.2–1.0)
MONOS PCT: 10 %
Neutro Abs: 5.8 10*3/uL (ref 1.4–6.5)
Neutrophils Relative %: 67 %
PLATELETS: 166 10*3/uL (ref 150–440)
RBC: 4.72 MIL/uL (ref 4.40–5.90)
RDW: 12.2 % (ref 11.5–14.5)
WBC: 8.6 10*3/uL (ref 3.8–10.6)

## 2015-05-18 LAB — COMPREHENSIVE METABOLIC PANEL
ALBUMIN: 3.7 g/dL (ref 3.5–5.0)
ALT: 30 U/L (ref 17–63)
ANION GAP: 5 (ref 5–15)
AST: 23 U/L (ref 15–41)
Alkaline Phosphatase: 58 U/L (ref 38–126)
BUN: 13 mg/dL (ref 6–20)
CHLORIDE: 105 mmol/L (ref 101–111)
CO2: 29 mmol/L (ref 22–32)
Calcium: 8.7 mg/dL — ABNORMAL LOW (ref 8.9–10.3)
Creatinine, Ser: 1.11 mg/dL (ref 0.61–1.24)
GFR calc Af Amer: >60 mL/min (ref >60)
GFR calc non Af Amer: >60 mL/min (ref >60)
GLUCOSE: 104 mg/dL — AB (ref 65–99)
POTASSIUM: 3.8 mmol/L (ref 3.5–5.1)
Sodium: 139 mmol/L (ref 135–145)
TOTAL PROTEIN: 6 g/dL — AB (ref 6.5–8.1)
Total Bilirubin: 0.6 mg/dL (ref 0.3–1.2)

## 2015-05-18 LAB — HEPARIN LEVEL (UNFRACTIONATED): HEPARIN UNFRACTIONATED: 0.41 [IU]/mL (ref 0.30–0.70)

## 2015-05-18 SURGERY — RENAL ANGIOGRAPHY
Anesthesia: Moderate Sedation | Laterality: Right

## 2015-05-18 MED ORDER — HEPARIN SODIUM (PORCINE) 1000 UNIT/ML IJ SOLN
INTRAMUSCULAR | Status: DC | PRN
  Administered 2015-05-18: 3000 [IU] via INTRAVENOUS

## 2015-05-18 MED ORDER — FENTANYL CITRATE (PF) 100 MCG/2ML IJ SOLN
INTRAMUSCULAR | Status: AC
  Filled 2015-05-18: qty 2

## 2015-05-18 MED ORDER — PANTOPRAZOLE SODIUM 40 MG PO TBEC: 40.0000 mg | DELAYED_RELEASE_TABLET | Freq: Every day | ORAL | Status: DC

## 2015-05-18 MED ORDER — HEPARIN SODIUM (PORCINE) 1000 UNIT/ML IJ SOLN
INTRAMUSCULAR | Status: AC
  Filled 2015-05-18: qty 1

## 2015-05-18 MED ORDER — DIPHENHYDRAMINE HCL 50 MG/ML IJ SOLN
INTRAMUSCULAR | Status: AC
  Filled 2015-05-18: qty 1

## 2015-05-18 MED ORDER — APIXABAN 5 MG PO TABS: 5.0000 mg | ORAL_TABLET | Freq: Two times a day (BID) | ORAL | Status: DC

## 2015-05-18 MED ORDER — HYDROCODONE-ACETAMINOPHEN 10-325 MG PO TABS: 1.0000 | ORAL_TABLET | ORAL | Status: DC | PRN

## 2015-05-18 MED ORDER — MIDAZOLAM HCL 5 MG/5ML IJ SOLN
INTRAMUSCULAR | Status: AC
  Filled 2015-05-18: qty 5

## 2015-05-18 MED ORDER — HEPARIN (PORCINE) IN NACL 2-0.9 UNIT/ML-% IJ SOLN
INTRAMUSCULAR | Status: AC
  Filled 2015-05-18: qty 1000

## 2015-05-18 MED ORDER — LIDOCAINE-EPINEPHRINE (PF) 1 %-1:200000 IJ SOLN
INTRAMUSCULAR | Status: DC | PRN
  Administered 2015-05-18: 10 mL via INTRADERMAL

## 2015-05-18 MED ORDER — MIDAZOLAM HCL 2 MG/2ML IJ SOLN
INTRAMUSCULAR | Status: DC | PRN
  Administered 2015-05-18: 1 mg via INTRAVENOUS
  Administered 2015-05-18: 2 mg via INTRAVENOUS

## 2015-05-18 MED ORDER — FENTANYL CITRATE (PF) 100 MCG/2ML IJ SOLN
INTRAMUSCULAR | Status: DC | PRN
  Administered 2015-05-18 (×3): 50 ug via INTRAVENOUS

## 2015-05-18 MED ORDER — CEFAZOLIN SODIUM 1-5 GM-% IV SOLN
INTRAVENOUS | Status: AC
  Filled 2015-05-18: qty 50

## 2015-05-18 MED ORDER — ONDANSETRON HCL 4 MG PO TABS: 4.0000 mg | ORAL_TABLET | Freq: Four times a day (QID) | ORAL | Status: DC | PRN

## 2015-05-18 MED ORDER — DIPHENHYDRAMINE HCL 50 MG/ML IJ SOLN
INTRAMUSCULAR | Status: DC | PRN
  Administered 2015-05-18: 25 mg via INTRAVENOUS

## 2015-05-18 MED ORDER — LIDOCAINE-EPINEPHRINE (PF) 1 %-1:200000 IJ SOLN
INTRAMUSCULAR | Status: AC
  Filled 2015-05-18: qty 30

## 2015-05-18 MED ORDER — HYDROCODONE-ACETAMINOPHEN 10-325 MG PO TABS
1.0000 | ORAL_TABLET | ORAL | Status: DC | PRN
  Administered 2015-05-18: 1 via ORAL
  Filled 2015-05-18: qty 1

## 2015-05-18 SURGICAL SUPPLY — 11 items
CATH C2 65CM (CATHETERS) ×3 IMPLANT
CATH PIG 70CM (CATHETERS) ×3 IMPLANT
CATH VS1 5F ANGIO SLIP (CATHETERS) ×3 IMPLANT
DEVICE STARCLOSE SE CLOSURE (Vascular Products) ×3 IMPLANT
DEVICE TORQUE (MISCELLANEOUS) ×3 IMPLANT
GLIDEWIRE STIFF .35X180X3 HYDR (WIRE) ×3 IMPLANT
PACK ANGIOGRAPHY (CUSTOM PROCEDURE TRAY) ×3 IMPLANT
SHEATH BRITE TIP 5FRX11 (SHEATH) ×3 IMPLANT
SYR MEDRAD MARK V 150ML (SYRINGE) ×3 IMPLANT
TUBING CONTRAST HIGH PRESS 72 (TUBING) ×3 IMPLANT
WIRE J 3MM .035X145CM (WIRE) ×3 IMPLANT

## 2015-05-18 NOTE — Progress Notes (Signed)
Pt. D/c with spouse discharge teaching with teach back. IV access removed x2. Pt. Tolerated well.

## 2015-05-18 NOTE — Care Management (Signed)
Patient will discharge home on Eliquis.  Provided with coupons.

## 2015-05-18 NOTE — Discharge Summary (Signed)
Johnny Robinson, is a 49 y.o. male  DOB [birth year]  MRN [id number].  Admission date:  05/16/2015  Admitting Physician  Henreitta Leber, MD  Discharge Date:  05/18/2015   Primary MD  SPARKS,JEFFREY D, MD  Recommendations for primary care physician for things to follow:   none   Admission Diagnosis  Renal infarct [N28.0]   Discharge Diagnosis  Renal infarct [N28.0]  Abdominal pain  Active Problems:   Renal infarct      Past Medical History  Diagnosis Date  . Reflux   . GERD (gastroesophageal reflux disease)     Past Surgical History  Procedure Laterality Date  . Back surgery         History of present illness and  Hospital Course:     Kindly see H&P for history of present illness and admission details, please review complete Labs, Consult reports and Test reports for all details in brief  HPI  from the history and physical done on the day of admission    Hospital Course    Pt admitted with abdominal pain and found to have right renal infarct. Echo, CT angio and renal artery angiogram non-diagnostic. Pain under good control. Now on Eliquis.   Discharge Condition: stable   Follow UP  Follow-up Information    Follow up with SPARKS,JEFFREY D, MD In 1 week.   Specialty:  Internal Medicine   Contact information:   [place] [address] [phone]         Discharge Instructions  and  Discharge Medications   See below     Medication List    STOP taking these medications        omeprazole 20 MG capsule  Commonly known as:  PRILOSEC     omeprazole 40 MG capsule  Commonly known as:  PRILOSEC  Replaced by:  pantoprazole 40 MG tablet      TAKE these medications        apixaban 5 MG Tabs tablet  Commonly known as:  ELIQUIS  Take 1 tablet (5 mg total) by mouth 2 (two) times daily.     HYDROcodone-acetaminophen 10-325 MG per tablet  Commonly  known as:  NORCO  Take 1 tablet by mouth every 4 (four) hours as needed for moderate pain.     ondansetron 4 MG tablet  Commonly known as:  ZOFRAN  Take 1 tablet (4 mg total) by mouth every 6 (six) hours as needed for nausea.     pantoprazole 40 MG tablet  Commonly known as:  PROTONIX  Take 1 tablet (40 mg total) by mouth daily.          Diet and Activity recommendation: See Discharge Instructions above   Consults obtained - Vascular Surgery   Major procedures and Radiology Reports - PLEASE review detailed and final reports for all details, in brief -   See below   Ct Abdomen Pelvis W Contrast  05/16/2015   CLINICAL DATA:  Abrupt onset severe right lower quadrant pain.  Initial encounter.  EXAM: CT ABDOMEN AND PELVIS WITH CONTRAST  TECHNIQUE: Multidetector CT imaging of the abdomen and pelvis was performed using the standard protocol following bolus administration of intravenous contrast.  CONTRAST:  186mL OMNIPAQUE IOHEXOL 300 MG/ML  SOLN  COMPARISON:  None.  FINDINGS: BODY WALL: No contributory findings.  LOWER CHEST: Calcified granulomas in the left lower lobe.  ABDOMEN/PELVIS:  Liver: No focal abnormality.  Biliary: No evidence of biliary obstruction or stone.  Pancreas: Unremarkable.  Spleen: Unremarkable.  Adrenals: Unremarkable.  Kidneys and ureters: There is a wedge-shaped well-defined area of nonenhancement involving the upper pole right kidney, likely capsular/ upper lobar branch occlusion (this vessel is relatively hypoenhancing). Additionally, there is focal luminal narrowing the level of the right renal artery bifurcation, near were the capsular branch arises, suggesting thrombus or focal dissection. No overt FMD changes, although not a CTA. No additional infarct noted.  Bladder: Unremarkable.  Reproductive: No pathologic findings.  Bowel: No obstruction. No appendicitis.  Retroperitoneum: No mass or adenopathy.  Peritoneum: No ascites or pneumoperitoneum.  Vascular: No acute  abnormality.  OSSEOUS: No acute abnormalities. Extensive degenerative disc disease and facet arthropathy for age, focally advanced at L2-3 where there is retrolisthesis and advanced disc narrowing.  These results were called by telephone at the time of interpretation on 05/16/2015 at 2:20 pm to Dr. Lenise Arena , who verbally acknowledged these results.  IMPRESSION: Abnormal upper pole pole right kidney most consistent with renal infarct (pyelonephritis can have a similar appearance and UA recommended). Focal right renal artery bifurcation luminal narrowing suspicious for dissection/thrombosis, with capsular branch involvement explaining the infarct. Based on location and patient age, question underlying fibromuscular dysplasia. Renal CTA could further evaluate as clinically indicated.   Electronically Signed   By: Monte Fantasia M.D.   On: 05/16/2015 14:23   Ct Angio Chest Aorta W/cm &/or Wo/cm  05/17/2015   CLINICAL DATA:  Left anterior chest pain since this morning. Patient admitted for abdominal pain due to right renal infarct. Abdominal pain improved on heparin drip.  EXAM: CT ANGIOGRAPHY CHEST WITH CONTRAST  TECHNIQUE: Multidetector CT imaging of the chest was performed using the standard protocol during bolus administration of intravenous contrast. Multiplanar CT image reconstructions and MIPs were obtained to evaluate the vascular anatomy.  CONTRAST:  75mL OMNIPAQUE IOHEXOL 350 MG/ML SOLN  COMPARISON:  CT abdomen/ pelvis 05/16/2015.  FINDINGS: Lungs are adequately inflated without consolidation or effusion. There is subtle bibasilar dependent atelectasis. Tiny calcification along the left major fissure and adjacent the right minor fissure as well as focal sub cm pleural-based calcification over the medial left lung base. Airways are normal.  Heart is normal in size. There is no evidence of pulmonary emboli. Thoracic aorta is normal in caliber without evidence of dissection or aneurysm. Takeoff of the  great vessels is normal. There are a couple small calcified mediastinal left hilar lymph nodes. Remaining mediastinal structures are within normal.  Images through the upper abdomen again demonstrate low-attenuation over the upper pole cortex of the right kidney thought to represent infarct. Minimal stranding of the perirenal fat anterior to the right mid to upper pole. The remainder of the exam is not significantly changed.  Review of the MIP images confirms the above findings.  IMPRESSION: No evidence pulmonary embolism. No evidence of thoracic aortic dissection or aneurysm.  No acute cardiopulmonary disease. Evidence of prior granulomas disease.  Stable changes over the upper pole of the right kidney thought to represent infarction and less likely  pyelonephritis.   Electronically Signed   By: Marin Olp M.D.   On: 05/17/2015 12:21    Micro Results   none  No results found for this or any previous visit (from the past 240 hour(s)).     Today   Subjective:   Johnny Robinson today has no headache,no chest abdominal pain,no new weakness tingling or numbness, feels much better wants to go home today.   Objective:   Blood pressure 111/64, pulse 72, temperature 98 F (36.7 C), temperature source Oral, resp. rate 18, height 6\' 1"  (1.854 m), weight 87.862 kg (193 lb 11.2 oz), SpO2 98 %.   Intake/Output Summary (Last 24 hours) at 05/18/15 0729 Last data filed at 05/18/15 0534  Gross per 24 hour  Intake   1080 ml  Output   2000 ml  Net   -920 ml    Exam Awake Alert, Oriented x 3, No new F.N deficits, Normal affect [place].AT,PERRAL Supple Neck,No JVD, No cervical lymphadenopathy appriciated.  Symmetrical Chest wall movement, Good air movement bilaterally, CTAB RRR,No Gallops,Rubs or new Murmurs, No Parasternal Heave +ve B.Sounds, Abd Soft, Non tender, No organomegaly appriciated, No rebound -guarding or rigidity. No Cyanosis, Clubbing or edema, No new Rash or bruise  Data Review   CBC w  Diff: Lab Results  Component Value Date   WBC 8.6 05/18/2015   HGB 14.9 05/18/2015   HCT 44.1 05/18/2015   PLT 166 05/18/2015   LYMPHOPCT 21 05/18/2015   MONOPCT 10 05/18/2015   EOSPCT 2 05/18/2015   BASOPCT 0 05/18/2015    CMP: Lab Results  Component Value Date   NA 140 05/17/2015   K 4.0 05/17/2015   CL 107 05/17/2015   CO2 28 05/17/2015   BUN 19 05/17/2015   CREATININE 1.23 05/17/2015   PROT 6.8 05/16/2015   ALBUMIN 4.4 05/16/2015   BILITOT 1.0 05/16/2015   ALKPHOS 66 05/16/2015   AST 22 05/16/2015   ALT 19 05/16/2015  .   Total Time in preparing paper work, data evaluation and todays exam - 60 minutes  SPARKS,JEFFREY D M.D on 05/18/2015 at 7:29 AM

## 2015-05-18 NOTE — Op Note (Signed)
[facility] Percutaneous Study/Intervention Procedural Note   Date of Surgery: 05/18/2015 Surgeon(s): Sabree Nuon Assistants: Pre-operative Diagnosis: right renal infarct Post-operative diagnosis: Same  Procedure(s) Performed: 1. Ultrasound guidance for vascular access right femoral artery 2. Catheter placement into right renal artery from right femoral approach 3. Aortogram and selective right renal angiogram 4. StarClose closure device right femoral artery   Indications: The patient is a 49 yo male who presents with a renal infarction. There was no clear source of embolus with no arrhythmias, normal echocardiogram, and a normal CT scan of the chest. Given the clinical scenario and the noninvasive findings, angiogram is indicated for further evaluation of his renal artery and potential treatment. Risks and benefits are discussed and informed consent is obtained.  Procedure: The patient was identified and appropriate procedural time out was performed. The patient was then placed supine on the table and prepped and draped in the usual sterile fashion. Ultrasound was used to evaluate the right common femoral artery. It was patent . A digital ultrasound image was acquired. A Seldinger needle was used to access the right common femoral artery under direct ultrasound guidance and a permanent image was performed. A 0.035 J wire was advanced without resistance and a 5Fr sheath was placed. Pigtail catheter was placed into the aorta at the L1 level and an AP aortogram was performed. This demonstrated what appeared to be normal origins of the renal and normal celiac and SMA with no aortic or iliac stenosis. The patient was then systemically heparinized with 3000 units of intravenous heparin. I used a VS1 catheter to cannulate the rightt renal artery and selective imaging was performed in AP, RAO, and LAO  projection. This demonstrated mild irregularity of the lower branch of the main renal artery with a small outpouching of what was likely the polar branch that occluded causing the infarction.  >90% of the kidney perfused well and appeared healthy.  The irregularity in the lower branch that fed this artery would suggest a spontaneous dissection that knocked off this small branch, but the main artery had good flow and no thrombus was seen in the lumen. I saw no evidence of FMD and a single selective image of the left kidney was performed with a C2 catheter and no abnormalities were seen in the left kidney. There was no role for endovascular revascularization.  The diagnostic catheter was removed. Oblique arteriogram was performed of the right femoral artery and StarClose closure device was deployed in the usual fashion with excellent hemostatic result. The patient was taken to the recovery room in stable condition having tolerated the procedure well.  Findings:  Aortogram/Renal Arteries:small branch artery occlusion of the main right renal artery branch with no flow limiting lesion seen in the remaining right renal vasculature.  No FMD.  No aortic disease.   Condition:  Stable  Complications: None   Lajuanda Penick 05/18/2015 10:06 AM

## 2015-05-18 NOTE — Discharge Instructions (Signed)
Call if symptoms worsen or if bleeding develops

## 2015-05-26 ENCOUNTER — Emergency Department: Payer: 59

## 2015-05-26 ENCOUNTER — Inpatient Hospital Stay
Admission: EM | Admit: 2015-05-26 | Discharge: 2015-05-28 | DRG: 698 | Disposition: A | Discharge status: Home / Self Care | Payer: 59 | Attending: Vascular Surgery | Admitting: Vascular Surgery

## 2015-05-26 ENCOUNTER — Encounter: Payer: Self-pay | Admitting: Emergency Medicine

## 2015-05-26 DIAGNOSIS — K219 Gastro-esophageal reflux disease without esophagitis: Secondary | ICD-10-CM | POA: Diagnosis present

## 2015-05-26 DIAGNOSIS — I7773 Dissection of renal artery: Secondary | ICD-10-CM | POA: Diagnosis present

## 2015-05-26 DIAGNOSIS — N280 Ischemia and infarction of kidney: Principal | ICD-10-CM | POA: Diagnosis present

## 2015-05-26 LAB — HEPARIN LEVEL (UNFRACTIONATED): Heparin Unfractionated: 1.8 IU/mL — ABNORMAL HIGH (ref 0.30–0.70)

## 2015-05-26 LAB — COMPREHENSIVE METABOLIC PANEL
ALBUMIN: 4.2 g/dL (ref 3.5–5.0)
ALT: 23 U/L (ref 17–63)
ANION GAP: 8 (ref 5–15)
AST: 20 U/L (ref 15–41)
Alkaline Phosphatase: 67 U/L (ref 38–126)
BILIRUBIN TOTAL: 0.7 mg/dL (ref 0.3–1.2)
BUN: 26 mg/dL — ABNORMAL HIGH (ref 6–20)
CO2: 27 mmol/L (ref 22–32)
Calcium: 9.5 mg/dL (ref 8.9–10.3)
Chloride: 103 mmol/L (ref 101–111)
Creatinine, Ser: 1.46 mg/dL — ABNORMAL HIGH (ref 0.61–1.24)
GFR calc non Af Amer: 56 mL/min — ABNORMAL LOW (ref >60)
GLUCOSE: 124 mg/dL — AB (ref 65–99)
POTASSIUM: 4 mmol/L (ref 3.5–5.1)
SODIUM: 138 mmol/L (ref 135–145)
TOTAL PROTEIN: 7 g/dL (ref 6.5–8.1)

## 2015-05-26 LAB — PROTIME-INR
INR: 1
Prothrombin Time: 13.4 seconds (ref 11.4–15.0)

## 2015-05-26 LAB — APTT
APTT: 33 s (ref 24–36)
aPTT: 51 seconds — ABNORMAL HIGH (ref 24–36)

## 2015-05-26 LAB — CBC
HCT: 45.4 % (ref 40.0–52.0)
Hemoglobin: 15.8 g/dL (ref 13.0–18.0)
MCH: 32.3 pg (ref 26.0–34.0)
MCHC: 34.7 g/dL (ref 32.0–36.0)
MCV: 92.8 fL (ref 80.0–100.0)
Platelets: 233 10*3/uL (ref 150–440)
RBC: 4.89 MIL/uL (ref 4.40–5.90)
RDW: 11.6 % (ref 11.5–14.5)
WBC: 10.2 10*3/uL (ref 3.8–10.6)

## 2015-05-26 MED ORDER — METOPROLOL TARTRATE 25 MG PO TABS
12.5000 mg | ORAL_TABLET | Freq: Two times a day (BID) | ORAL | Status: DC
  Administered 2015-05-26 – 2015-05-27 (×4): 12.5 mg via ORAL
  Filled 2015-05-26 (×4): qty 1

## 2015-05-26 MED ORDER — OXYCODONE HCL 5 MG PO TABS
5.0000 mg | ORAL_TABLET | ORAL | Status: DC | PRN
  Administered 2015-05-26 – 2015-05-27 (×2): 10 mg via ORAL
  Administered 2015-05-27: 5 mg via ORAL
  Administered 2015-05-27 (×2): 10 mg via ORAL
  Administered 2015-05-28: 5 mg via ORAL
  Filled 2015-05-26 (×2): qty 2
  Filled 2015-05-26 (×2): qty 1
  Filled 2015-05-26 (×2): qty 2

## 2015-05-26 MED ORDER — IOHEXOL 350 MG/ML SOLN
100.0000 mL | Freq: Once | INTRAVENOUS | Status: AC | PRN
  Administered 2015-05-26: 100 mL via INTRAVENOUS

## 2015-05-26 MED ORDER — HYDROMORPHONE HCL 1 MG/ML IJ SOLN
1.0000 mg | Freq: Once | INTRAMUSCULAR | Status: AC
  Administered 2015-05-26: 1 mg via INTRAVENOUS
  Filled 2015-05-26: qty 1

## 2015-05-26 MED ORDER — HEPARIN (PORCINE) IN NACL 100-0.45 UNIT/ML-% IJ SOLN
10.0000 [IU]/kg/h | Freq: Once | INTRAMUSCULAR | Status: AC
  Administered 2015-05-26: 10 [IU]/kg/h via INTRAVENOUS
  Filled 2015-05-26: qty 250

## 2015-05-26 MED ORDER — INFLUENZA VAC SPLIT QUAD 0.5 ML IM SUSY: 0.5000 mL | PREFILLED_SYRINGE | INTRAMUSCULAR | Status: DC

## 2015-05-26 MED ORDER — HEPARIN (PORCINE) IN NACL 100-0.45 UNIT/ML-% IJ SOLN
1300.0000 [IU]/h | INTRAMUSCULAR | Status: AC
  Administered 2015-05-26: 900 [IU]/h via INTRAVENOUS
  Administered 2015-05-27: 1100 [IU]/h via INTRAVENOUS
  Filled 2015-05-26 (×3): qty 250

## 2015-05-26 MED ORDER — MORPHINE SULFATE (PF) 2 MG/ML IV SOLN
2.0000 mg | Freq: Once | INTRAVENOUS | Status: AC
  Administered 2015-05-26: 2 mg via INTRAVENOUS

## 2015-05-26 MED ORDER — POLYETHYLENE GLYCOL 3350 17 G PO PACK: 17.0000 g | PACK | Freq: Every day | ORAL | Status: DC | PRN

## 2015-05-26 MED ORDER — PROMETHAZINE HCL 25 MG/ML IJ SOLN
12.5000 mg | Freq: Four times a day (QID) | INTRAMUSCULAR | Status: DC | PRN
  Administered 2015-05-26: 12.5 mg via INTRAVENOUS
  Filled 2015-05-26: qty 1

## 2015-05-26 MED ORDER — ACETAMINOPHEN 650 MG RE SUPP: 325.0000 mg | RECTAL | Status: DC | PRN

## 2015-05-26 MED ORDER — ONDANSETRON HCL 4 MG/2ML IJ SOLN
4.0000 mg | Freq: Once | INTRAMUSCULAR | Status: AC
  Administered 2015-05-26: 4 mg via INTRAVENOUS

## 2015-05-26 MED ORDER — ONDANSETRON HCL 4 MG/2ML IJ SOLN
INTRAMUSCULAR | Status: AC
  Administered 2015-05-26: 4 mg via INTRAVENOUS
  Filled 2015-05-26: qty 2

## 2015-05-26 MED ORDER — DOCUSATE SODIUM 100 MG PO CAPS
100.0000 mg | ORAL_CAPSULE | Freq: Every day | ORAL | Status: DC
  Administered 2015-05-27: 100 mg via ORAL
  Filled 2015-05-26: qty 1

## 2015-05-26 MED ORDER — MORPHINE SULFATE (PF) 2 MG/ML IV SOLN
INTRAVENOUS | Status: AC
  Administered 2015-05-26: 2 mg via INTRAVENOUS
  Filled 2015-05-26: qty 1

## 2015-05-26 MED ORDER — HEPARIN (PORCINE) IN NACL 100-0.45 UNIT/ML-% IJ SOLN: 10.0000 [IU]/kg/h | INTRAMUSCULAR | Status: DC

## 2015-05-26 MED ORDER — ALUM & MAG HYDROXIDE-SIMETH 200-200-20 MG/5ML PO SUSP: 15.0000 mL | ORAL | Status: DC | PRN

## 2015-05-26 MED ORDER — MORPHINE SULFATE (PF) 2 MG/ML IV SOLN
2.0000 mg | INTRAVENOUS | Status: DC | PRN
  Administered 2015-05-26: 2 mg via INTRAVENOUS
  Filled 2015-05-26: qty 1

## 2015-05-26 MED ORDER — SORBITOL 70 % SOLN: 30.0000 mL | Freq: Every day | Status: DC | PRN

## 2015-05-26 MED ORDER — PANTOPRAZOLE SODIUM 40 MG PO TBEC
40.0000 mg | DELAYED_RELEASE_TABLET | Freq: Every day | ORAL | Status: DC
  Administered 2015-05-26 – 2015-05-27 (×2): 40 mg via ORAL
  Filled 2015-05-26 (×3): qty 1

## 2015-05-26 MED ORDER — ONDANSETRON HCL 4 MG/2ML IJ SOLN
4.0000 mg | Freq: Four times a day (QID) | INTRAMUSCULAR | Status: DC | PRN
  Administered 2015-05-26 – 2015-05-28 (×3): 4 mg via INTRAVENOUS
  Filled 2015-05-26 (×3): qty 2

## 2015-05-26 MED ORDER — SODIUM CHLORIDE 0.9 % IV BOLUS (SEPSIS)
1000.0000 mL | Freq: Once | INTRAVENOUS | Status: AC
  Administered 2015-05-26: 1000 mL via INTRAVENOUS

## 2015-05-26 MED ORDER — PANTOPRAZOLE SODIUM 40 MG PO TBEC: 40.0000 mg | DELAYED_RELEASE_TABLET | Freq: Every day | ORAL | Status: DC

## 2015-05-26 MED ORDER — ACETAMINOPHEN 325 MG PO TABS: 325.0000 mg | ORAL_TABLET | ORAL | Status: DC | PRN

## 2015-05-26 MED ORDER — ASPIRIN EC 81 MG PO TBEC
81.0000 mg | DELAYED_RELEASE_TABLET | Freq: Every day | ORAL | Status: DC
  Administered 2015-05-26 – 2015-05-27 (×2): 81 mg via ORAL
  Filled 2015-05-26 (×2): qty 1

## 2015-05-26 NOTE — ED Provider Notes (Signed)
[facility] Emergency Department Provider Note  ____________________________________________  Time seen: 6:40AM  I have reviewed the triage vital signs and the nursing notes.   HISTORY  Chief Complaint Abdominal Pain     HPI Johnny Robinson is a 49 y.o. male presents with acute onset of right upper quadrant/flank abdominal pain. Of note patient was seen on 05/16/2015 at which point he was diagnosed with a right renal artery dissection with thrombus as well as a right renal infarct. Patient was prescribed Eliquis at that time. Patient was seen by Dr. dew vascular surgeon on call on that day. Patient now comes in stating that pain returned around midnight and has gradually worsened since that time. Patient states that his current pain score is 7 out of 10. Patient also admits to nausea no vomiting. Patient denies any fever. The emergency department charge nurse received a call and advanced with Dr. Hinton Lovely with concern for possible renal hemorrhage versus further infarction.     Past Medical History  Diagnosis Date  . Reflux   . GERD (gastroesophageal reflux disease)     Patient Active Problem List   Diagnosis Date Noted  . 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 Back surgery    . Peripheral vascular catheterization Right 05/18/2015    Procedure: Renal Angiography;  Surgeon: Algernon Huxley, MD;  Location: [facility];  Service: Cardiovascular;  Laterality: Right;  . Peripheral vascular catheterization Bilateral 05/18/2015    Procedure: Renal Intervention;  Surgeon: Algernon Huxley, MD;  Location: [facility];  Service: Cardiovascular;  Laterality: Bilateral;    Current Outpatient Rx  Name  Route  Sig  Dispense  Refill  . apixaban (ELIQUIS) 5 MG TABS tablet   Oral   Take 1 tablet (5 mg total) by mouth 2 (two) times daily.   60 tablet   5   . HYDROcodone-acetaminophen (NORCO) 10-325 MG per tablet  Oral   Take 1 tablet by mouth every 4 (four) hours as needed for moderate pain.   30 tablet   0   . ondansetron (ZOFRAN) 4 MG tablet   Oral   Take 1 tablet (4 mg total) by mouth every 6 (six) hours as needed for nausea.   20 tablet   0   . pantoprazole (PROTONIX) 40 MG tablet   Oral   Take 1 tablet (40 mg total) by mouth daily.   30 tablet   5     Allergies Review of patient's allergies indicates no known allergies.  Family History  Problem Relation Age of Onset  . Nephrolithiasis Mother   . Cancer Father     Social History Social History  Substance Use Topics  . Smoking status: Never Smoker   . Smokeless tobacco: Never Used  . Alcohol Use: Yes     Comment: occasional    Review of Systems  Constitutional: Negative for fever. Eyes: Negative for visual changes. ENT: Negative for sore throat. Cardiovascular: Negative for chest pain. Respiratory: Negative for shortness of breath. Gastrointestinal: Positive for abdominal pain and nausea, negative for vomiting and diarrhea. Genitourinary: Negative for dysuria. Musculoskeletal: Negative for back pain. Skin: Negative for rash. Neurological: Negative for headaches, focal weakness or numbness.   10-point ROS otherwise negative.  ____________________________________________   PHYSICAL EXAM:  VITAL SIGNS: ED Triage Vitals  Enc Vitals Group     BP 05/26/15 0633 125/83 mmHg     Pulse Rate 05/26/15  3151 72     Resp 05/26/15 0633 21     Temp 05/26/15 0633 98 F (36.7 C)     Temp Source 05/26/15 0633 Oral     SpO2 05/26/15 0633 97 %     Weight 05/26/15 0633 197 lb (89.359 kg)     Height 05/26/15 0633 6\' 1"  (1.854 m)     Head Cir --      Peak Flow --      Pain Score 05/26/15 0635 7     Pain Loc --      Pain Edu? --      Excl. in [place]? --      Constitutional: Alert and oriented. Well appearing and in no distress. Eyes: Conjunctivae are normal. PERRL. Normal extraocular movements. ENT   Head:  Normocephalic and atraumatic.   Nose: No congestion/rhinnorhea.   Mouth/Throat: Mucous membranes are moist.   Neck: No stridor. Cardiovascular: Normal rate, regular rhythm. Normal and symmetric distal pulses are present in all extremities. No murmurs, rubs, or gallops. Respiratory: Normal respiratory effort without tachypnea nor retractions. Breath sounds are clear and equal bilaterally. No wheezes/rales/rhonchi. Gastrointestinal: Right upper quadrant abdominal pain with palpation. No distention. There is no CVA tenderness. Genitourinary: deferred Musculoskeletal: Nontender with normal range of motion in all extremities. No joint effusions.  No lower extremity tenderness nor edema. Neurologic:  Normal speech and language. No gross focal neurologic deficits are appreciated. Speech is normal.  Skin:  Skin is warm, dry and intact. No rash noted. Psychiatric: Mood and affect are normal. Speech and behavior are normal. Patient exhibits appropriate insight and judgment.  ____________________________________________    LABS (pertinent positives/negatives)  Labs Reviewed  COMPREHENSIVE METABOLIC PANEL - Abnormal; Notable for the following:    Glucose, Bld 124 (*)    BUN 26 (*)    Creatinine, Ser 1.46 (*)    GFR calc non Af Amer 56 (*)    All other components within normal limits  CBC  PROTIME-INR       RADIOLOGY  CT Abdomen Pelvis W Contrast (Final result) Result time: 05/26/15 76:16:07   Final result by Rad Results In Interface (05/26/15 37:10:62)   Narrative:   CLINICAL DATA: Acute right-sided abdominal pain.  EXAM: CT ABDOMEN AND PELVIS WITH CONTRAST  TECHNIQUE: Multidetector CT imaging of the abdomen and pelvis was performed using the standard protocol following bolus administration of intravenous contrast.  CONTRAST: 128mL OMNIPAQUE IOHEXOL 350 MG/ML SOLN  COMPARISON: CT scan of May 16, 2015.  FINDINGS: Multilevel degenerative disc disease is noted  in the lumbar spine. Visualized lung bases appear normal.  No gallstones are noted. The liver, spleen and pancreas appear normal. Adrenal glands appear normal. There is no evidence of abdominal aortic aneurysm or dissection. No hydronephrosis or renal obstruction is noted. No renal or ureteral calculi are noted. Left kidney appears normal. There is area of non enhancement involving the upper and middle pole of the right kidney which is increased in size compared to prior exam, consistent with worsening renal infarction. Embolic etiology cannot be excluded. On the delayed post-contrast images, there is decreased enhancement of the posterior margin of the right kidney as well, suggesting evidence of early infarction in this area. Retro aortic left renal vein is noted. Left renal artery is widely patent. There appears to be significant long length narrowing within the right renal artery which has significantly progressed since prior exam, and is concerning for dissection or possibly thrombus.  There is no evidence of bowel  obstruction. The appendix appears normal. No abnormal fluid collection is noted. Sigmoid diverticulosis is noted without inflammation. Urinary bladder appears normal. No significant adenopathy is noted.  IMPRESSION: Compared to prior exam, there is a larger area of infarction involving the upper and middle pole of the right kidney, with delayed post-contrast images suggesting early ischemic changes involving the entire posterior portion of the right kidney. There is noted severe luminal narrowing throughout almost the entire right renal artery which is increased compared to prior exam, concerning for possible dissection or thrombus. Critical Value/emergent results were called by telephone at the time of interpretation on 05/26/2015 at 7:28 am to Dr. Owens Shark, who verbally acknowledged these results.   Electronically Signed By: Marijo Conception, M.D. On: 05/26/2015  07:29        ____________________________________________   Critical care:CRITICAL CARE Performed by: Gregor Hams   Total critical care time: 26minutes  Critical care time was exclusive of separately billable procedures and treating other patients.  Critical care was necessary to treat or prevent imminent or life-threatening deterioration.  Critical care was time spent personally by me on the following activities: development of treatment plan with patient and/or surrogate as well as nursing, discussions with consulI reviewed the patient's CAT scan and spoke with the radiologist Dr.tants, evaluation of patient's response to treatment, examination of patient, obtaining history from patient or surrogate, ordering and performing treatments and interventions, ordering and review of laboratory studies, ordering and review of radiographic studies, pulse oximetry and re-evaluation of patient's condition.   INITIAL IMPRESSION / ASSESSMENT AND PLAN / ED COURSE  Pertinent labs & imaging results that were available during my care of the patient were reviewed by me and considered in my medical decision making (see chart for details).  Spoke with Dr. Delana Meyer after his evaluation and patient requested a heparin drip to be started.   ____________________________________________   FINAL CLINICAL IMPRESSION(S) / ED DIAGNOSES  Final diagnoses:  Renal infarction      Gregor Hams, MD 06/02/15 [phone]

## 2015-05-26 NOTE — Progress Notes (Signed)
Contacted Dr Delana Meyer for clarification of admission orders; told Dr that orders stated they were for post-op; Dr stated that orders were correct and to use them now; also notified Dr of pt c/o nausea and request for phenergan; Dr ordered phenergan 12.5 mg Q6 hrs PRN for nausea

## 2015-05-26 NOTE — Progress Notes (Signed)
ANTICOAGULATION CONSULT NOTE - Initial Consult  Pharmacy Consult for heparin drip dosing and monitoring Indication: Renal infarct  No Known Allergies  Patient Measurements: Height: 6\' 1"  (185.4 cm) Weight: 197 lb (89.359 kg) IBW/kg (Calculated) : 79.9 Heparin Dosing Weight: 89.4 kg  Vital Signs: Temp: 98 F (36.7 C) (09/08 0633) Temp Source: Oral (09/08 3005) BP: 128/93 mmHg (09/08 0930) Pulse Rate: 70 (09/08 0930)  Labs:  Recent Labs  05/26/15 0641  HGB 15.8  HCT 45.4  PLT 233  APTT 33  LABPROT 13.4  INR 1.00  CREATININE 1.46*    Estimated Creatinine Clearance: 71.4 mL/min (by C-G formula based on Cr of 1.46).   Medical History: Past Medical History  Diagnosis Date  . Reflux   . GERD (gastroesophageal reflux disease)     Medications:  Scheduled:  . aspirin EC  81 mg Oral Daily  . metoprolol tartrate  12.5 mg Oral BID  . pantoprazole  40 mg Oral Daily    Assessment:  Patient was started on heparin drip this morning for a renal infarct. Physician ordered an initial rate of 900 units/hr with no bolus this morning. Dose was clarified with physician and heparin gtt was started at ~08:40 this morning. Pharmacy was then consulted to monitor and dose heparin. Patient did report taking apixaban 5 mg dose this morning.   Goal of Therapy:  Heparin level 0.3-0.7 units/ml Monitor platelets by anticoagulation protocol: Yes   Plan:  Anti-Xa level ordered for 15:00 today (9/8), which is 6 hours after heparin drip was started. Pharmacy will follow up with anti-Xa level and adjust heparin drip as needed to maintain therapeutic anti-Xa of 0.3 - 0.7 units/mL.   Pharmacy will continue to monitor platelets per protocol.   Thank you for the consult.   Darylene Price Charlen Bakula 05/26/2015,10:29 AM

## 2015-05-26 NOTE — ED Notes (Signed)
Pt presents to ED via POV accompanied by spouse with c/o of right sided abdominal pain. Pt states he was seen for a renal infarct by vascular surgery and an angiogram was completed. Pt states he has been experiencing presenting sx this evening and was advised to seek immediate ER treatment. Pt states his abdominal pain is located to right side of abdomen, with tenderness to lower right quadrant area. Pt is alert and oriented x4. ER physician at bedside, completing initial medical evaluation.

## 2015-05-26 NOTE — Progress Notes (Signed)
ANTICOAGULATION CONSULT NOTE - Initial Consult  Pharmacy Consult for heparin drip dosing and monitoring Indication: Renal infarct  No Known Allergies  Patient Measurements: Height: 6\' 1"  (185.4 cm) Weight: 197 lb (89.359 kg) IBW/kg (Calculated) : 79.9 Heparin Dosing Weight: 89.4 kg  Vital Signs: Temp: 97.4 F (36.3 C) (09/08 1621) Temp Source: Oral (09/08 1621) BP: 136/82 mmHg (09/08 1621) Pulse Rate: 72 (09/08 1621)  Labs:  Recent Labs  05/26/15 0641 05/26/15 1451  HGB 15.8  --   HCT 45.4  --   PLT 233  --   APTT 33 51*  LABPROT 13.4  --   INR 1.00  --   HEPARINUNFRC  --  1.80*  CREATININE 1.46*  --     Estimated Creatinine Clearance: 71.4 mL/min (by C-G formula based on Cr of 1.46).   Medical History: Past Medical History  Diagnosis Date  . Reflux   . GERD (gastroesophageal reflux disease)     Medications:  Scheduled:  . aspirin EC  81 mg Oral Daily  . [START ON 05/27/2015] docusate sodium  100 mg Oral Daily  . [START ON 05/27/2015] Influenza vac split quadrivalent PF  0.5 mL Intramuscular Tomorrow-1000  . metoprolol tartrate  12.5 mg Oral BID  . pantoprazole  40 mg Oral Daily    Assessment:  Patient was started on heparin drip this morning for a renal infarct. Physician ordered an initial rate of 900 units/hr with no bolus this morning. Dose was clarified with physician and heparin gtt was started at ~08:40 this morning. Pharmacy was then consulted to monitor and dose heparin. Patient did report taking apixaban 5 mg dose this morning.   Goal of Therapy:  Heparin level 0.3-0.7 units/ml Monitor platelets by anticoagulation protocol: Yes   Plan:  Anti-Xa level ordered for 15:00 today (9/8), which is 6 hours after heparin drip was started. Pharmacy will follow up with anti-Xa level and adjust heparin drip as needed to maintain therapeutic anti-Xa of 0.3 - 0.7 units/mL.   Pharmacy will continue to monitor platelets per protocol.   9/8:  HL @ 15:00 = 1.8,   APTT = 51  Will increase drip rate to 1000 units/hr and recheck HL and aPTT 6 hrs after rate change on 9/8 @ 23:00.   Thank you for the consult.   Kalani Sthilaire D 05/26/2015,5:02 PM

## 2015-05-27 LAB — BASIC METABOLIC PANEL
ANION GAP: 9 (ref 5–15)
BUN: 21 mg/dL — AB (ref 6–20)
CO2: 27 mmol/L (ref 22–32)
Calcium: 9 mg/dL (ref 8.9–10.3)
Chloride: 100 mmol/L — ABNORMAL LOW (ref 101–111)
Creatinine, Ser: 1.56 mg/dL — ABNORMAL HIGH (ref 0.61–1.24)
GFR calc Af Amer: 60 mL/min — ABNORMAL LOW (ref >60)
GFR, EST NON AFRICAN AMERICAN: 52 mL/min — AB (ref >60)
Glucose, Bld: 111 mg/dL — ABNORMAL HIGH (ref 65–99)
POTASSIUM: 3.6 mmol/L (ref 3.5–5.1)
SODIUM: 136 mmol/L (ref 135–145)

## 2015-05-27 LAB — HEPARIN LEVEL (UNFRACTIONATED)
HEPARIN UNFRACTIONATED: 1.04 [IU]/mL — AB (ref 0.30–0.70)
Heparin Unfractionated: 1.34 IU/mL — ABNORMAL HIGH (ref 0.30–0.70)
Heparin Unfractionated: 0.79 IU/mL — ABNORMAL HIGH (ref 0.30–0.70)

## 2015-05-27 LAB — CBC
HCT: 45.8 % (ref 40.0–52.0)
HEMOGLOBIN: 15.6 g/dL (ref 13.0–18.0)
MCH: 31.5 pg (ref 26.0–34.0)
MCHC: 34.1 g/dL (ref 32.0–36.0)
MCV: 92.4 fL (ref 80.0–100.0)
Platelets: 221 10*3/uL (ref 150–440)
RBC: 4.96 MIL/uL (ref 4.40–5.90)
RDW: 11.7 % (ref 11.5–14.5)
WBC: 15 10*3/uL — ABNORMAL HIGH (ref 3.8–10.6)

## 2015-05-27 LAB — APTT
APTT: 60 s — AB (ref 24–36)
APTT: 67 s — AB (ref 24–36)
APTT: 63 s — AB (ref 24–36)

## 2015-05-27 MED ORDER — APIXABAN 5 MG PO TABS
5.0000 mg | ORAL_TABLET | Freq: Two times a day (BID) | ORAL | Status: DC
  Administered 2015-05-28: 5 mg via ORAL
  Filled 2015-05-27: qty 1

## 2015-05-27 NOTE — Progress Notes (Signed)
ANTICOAGULATION CONSULT NOTE - FOLLOW UP   Pharmacy Consult for heparin drip dosing and monitoring Indication: Renal infarct  No Known Allergies  Patient Measurements: Height: 6\' 1"  (185.4 cm) Weight: 197 lb (89.359 kg) IBW/kg (Calculated) : 79.9 Heparin Dosing Weight: 89.4 kg  Vital Signs: Temp: 98.5 F (36.9 C) (09/09 1522) Temp Source: Oral (09/09 1522) BP: 126/81 mmHg (09/09 1522) Pulse Rate: 79 (09/09 1522)  Labs:  Recent Labs  05/26/15 0641  05/27/15 0015 05/27/15 0752 05/27/15 1552  HGB 15.8  --   --  15.6  --   HCT 45.4  --   --  45.8  --   PLT 233  --   --  221  --   APTT 33  < > 63* 60* 67*  LABPROT 13.4  --   --   --   --   INR 1.00  --   --   --   --   HEPARINUNFRC  --   < > 1.34* 1.04* 0.79*  CREATININE 1.46*  --   --  1.56*  --   < > = values in this interval not displayed.  Estimated Creatinine Clearance: 66.9 mL/min (by C-G formula based on Cr of 1.56).   Medical History: Past Medical History  Diagnosis Date  . Reflux   . GERD (gastroesophageal reflux disease)     Medications:  Scheduled:  . aspirin EC  81 mg Oral Daily  . docusate sodium  100 mg Oral Daily  . Influenza vac split quadrivalent PF  0.5 mL Intramuscular Tomorrow-1000  . metoprolol tartrate  12.5 mg Oral BID  . pantoprazole  40 mg Oral Daily    Assessment:  Patient was started on heparin drip this morning for a renal infarct. Physician ordered an initial rate of 900 units/hr with no bolus this morning. Dose was clarified with physician and heparin gtt was started at ~08:40 this morning. Pharmacy was then consulted to monitor and dose heparin. Patient did report taking apixaban 5 mg dose this morning.   Goal of Therapy:  Heparin level 0.3-0.7 units/ml Monitor platelets by anticoagulation protocol: Yes   Plan:  Anti-Xa level ordered for 15:00 today (9/8), which is 6 hours after heparin drip was started. Pharmacy will follow up with anti-Xa level and adjust heparin drip as  needed to maintain therapeutic anti-Xa of 0.3 - 0.7 units/mL.   Pharmacy will continue to monitor platelets per protocol.   9/8:  HL @ 15:00 = 1.8,  APTT = 51  Will increase drip rate to 1000 units/hr and recheck HL and aPTT 6 hrs after rate change on 9/8 @ 23:00.   9/9 00:00 aPTT 63, anti-Xa 1.34. Increase rate to 1100 units/hr. Recheck aPTT, anti-Xa, and CBC in 6 hours.  9/9 08:00 Anti Xa level = 1.04 and APTT resulted @ 60. Will increase heparin gtt to 1200 units/hr. Will order another APTT and Anti xa level @ 16:00. Goal range APTT is 68-109   9/9:  HL @ 16:00 = 0.79         APTT @ 16:00 = 67 Will increase drip rate to 1300 units/hr and recheck aPTT and HL 6 hrs after rate change on 9/10 @ 00:00.   Thank you for the consult.   Johnny Robinson D 05/27/2015,5:08 PM

## 2015-05-27 NOTE — H&P (Signed)
[facility] Admission History & Physical  MRN : [id number]  FREDIE MAJANO is a 49 y.o. (Nov 04, 1965) male who presents with chief complaint of  Chief Complaint  Patient presents with  . Abdominal Pain  .  History of Present Illness: The patient is a 49 year old gentleman with a recent history of right upper pole renal infarct. The etiology was uncertain. He was admitted to the hospital a little over one week ago on 05/16/2015. There was no history of arrhythmia or atrial fibrillation. CT scan of the abdomen and chest did not demonstrate any focal abnormality of the vascular system. No evidence of FMD was identified on either CT scan or angiography. At that time he was started on Eliquis was discharged to home.  Early this morning he began having similar symptoms in the right flank his pain returned and intensified therefore he came back to the emergency room. No fever or chills no hypotension no history of recent trauma.  CT scan demonstrated increased size in the right renal infarct is therefore being admitted for further treatment and evaluation  Current Facility-Administered Medications  Medication Dose Route Frequency Provider Last Rate Last Dose  . acetaminophen (TYLENOL) tablet 325-650 mg  325-650 mg Oral Q4H PRN Katha Cabal, MD       Or  . acetaminophen (TYLENOL) suppository 325-650 mg  325-650 mg Rectal Q4H PRN Katha Cabal, MD      . alum & mag hydroxide-simeth (MAALOX/MYLANTA) 200-200-20 MG/5ML suspension 15-30 mL  15-30 mL Oral Q2H PRN Katha Cabal, MD      . aspirin EC tablet 81 mg  81 mg Oral Daily Katha Cabal, MD   81 mg at 05/26/15 1022  . docusate sodium (COLACE) capsule 100 mg  100 mg Oral Daily Katha Cabal, MD      . heparin ADULT infusion 100 units/mL (25000 units/250 mL)  1,100 Units/hr Intravenous Continuous Katha Cabal, MD 11 mL/hr at 05/27/15 0527 1,100 Units/hr at 05/27/15 0527  . Influenza vac split quadrivalent  PF (FLUARIX) injection 0.5 mL  0.5 mL Intramuscular Tomorrow-1000 Katha Cabal, MD      . metoprolol tartrate (LOPRESSOR) tablet 12.5 mg  12.5 mg Oral BID Katha Cabal, MD   12.5 mg at 05/26/15 2044  . morphine 2 MG/ML injection 2-4 mg  2-4 mg Intravenous Q1H PRN Katha Cabal, MD   2 mg at 05/26/15 1219  . ondansetron (ZOFRAN) injection 4 mg  4 mg Intravenous Q6H PRN Katha Cabal, MD   4 mg at 05/26/15 1045  . oxyCODONE (Oxy IR/ROXICODONE) immediate release tablet 5-10 mg  5-10 mg Oral Q4H PRN Katha Cabal, MD   10 mg at 05/26/15 2044  . pantoprazole (PROTONIX) EC tablet 40 mg  40 mg Oral Daily Katha Cabal, MD   40 mg at 05/26/15 1021  . polyethylene glycol (MIRALAX / GLYCOLAX) packet 17 g  17 g Oral Daily PRN Katha Cabal, MD      . promethazine (PHENERGAN) injection 12.5 mg  12.5 mg Intravenous Q6H PRN Katha Cabal, MD   12.5 mg at 05/26/15 1359  . sorbitol 70 % solution 30 mL  30 mL Oral Daily PRN Katha Cabal, MD        Past Medical History  Diagnosis Date  . Reflux   . GERD (gastroesophageal reflux disease)     Past Surgical History  Procedure Laterality Date  . Back surgery    .  Peripheral vascular catheterization Right 05/18/2015    Procedure: Renal Angiography;  Surgeon: Algernon Huxley, MD;  Location: [facility];  Service: Cardiovascular;  Laterality: Right;  . Peripheral vascular catheterization Bilateral 05/18/2015    Procedure: Renal Intervention;  Surgeon: Algernon Huxley, MD;  Location: [facility];  Service: Cardiovascular;  Laterality: Bilateral;    Social History Social History  Substance Use Topics  . Smoking status: Never Smoker   . Smokeless tobacco: Never Used  . Alcohol Use: Yes     Comment: occasional    Family History Family History  Problem Relation Age of Onset  . Nephrolithiasis Mother   . Cancer Father    no family history of porphyria or autoimmune disease. No family history of bleeding  clotting disorders  No Known Allergies   REVIEW OF SYSTEMS (Negative unless checked)  Constitutional: [] Weight loss  [] Fever  [] Chills Cardiac: [] Chest pain   [] Chest pressure   [] Palpitations   [] Shortness of breath when laying flat   [] Shortness of breath at rest   [] Shortness of breath with exertion. Vascular:  [] Pain in legs with walking   [] Pain in legs at rest   [] Pain in legs when laying flat   [] Claudication   [] Pain in feet when walking  [] Pain in feet at rest  [] Pain in feet when laying flat   [] History of DVT   [] Phlebitis   [] Swelling in legs   [] Varicose veins   [] Non-healing ulcers Pulmonary:   [] Uses home oxygen   [] Productive cough   [] Hemoptysis   [] Wheeze  [] COPD   [] Asthma Neurologic:  [] Dizziness  [] Blackouts   [] Seizures   [] History of stroke   [] History of TIA  [] Aphasia   [] Temporary blindness   [] Dysphagia   [] Weakness or numbness in arms   [] Weakness or numbness in legs Musculoskeletal:  [] Arthritis   [] Joint swelling   [] Joint pain   [] Low back pain Hematologic:  [] Easy bruising  [] Easy bleeding   [] Hypercoagulable state   [] Anemic  [] Hepatitis Gastrointestinal:  [] Blood in stool   [] Vomiting blood  [] Gastroesophageal reflux/heartburn   [] Difficulty swallowing. Genitourinary:  [] Chronic kidney disease   [] Difficult urination  [] Frequent urination  [] Burning with urination   [] Blood in urine Skin:  [] Rashes   [] Ulcers   [] Wounds Psychological:  [] History of anxiety   []  History of major depression.  Physical Examination  Filed Vitals:   05/26/15 1252 05/26/15 1621 05/26/15 2044 05/26/15 2320  BP: 137/86 136/82 147/77 127/78  Pulse: 66 72  72  Temp: 97.9 F (36.6 C) 97.4 F (36.3 C)  98.2 F (36.8 C)  TempSrc: Oral Oral  Oral  Resp:    16  Height:      Weight:      SpO2: 98% 97%  96%   Body mass index is 26 kg/(m^2). Gen: WD/WN, NAD Head: [place]/AT, No temporalis wasting. Prominent temp pulse not noted. Ear/Nose/Throat: Hearing grossly intact, nares w/o  erythema or drainage, oropharynx w/o Erythema/Exudate,  Eyes: PERRLA, EOMI.  Neck: Supple, no nuchal rigidity.  No bruit or JVD.  Pulmonary:  Good air movement, clear to auscultation bilaterally, no use of accessory muscles.  Cardiac: RRR, normal S1, S2, no Murmurs, rubs or gallops. Vascular: No carotid bruits Vessel Right Left  Radial Palpable Palpable  Ulnar Palpable Palpable  Brachial Palpable Palpable  Carotid Palpable, without bruit Palpable, without bruit  Aorta Not palpable N/A  Femoral Palpable Palpable  Popliteal Palpable Palpable  PT Palpable Palpable  DP  Palpable Palpable   Gastrointestinal: Right flank with mild tenderness to palpation and no masses appreciated soft, non-tender/non-distended. No guarding/reflex.  Musculoskeletal: M/S 5/5 throughout.  Extremities without ischemic changes.  No deformity or atrophy.  Neurologic: CN 2-12 intact. Pain and light touch intact in extremities.  Symmetrical.  Speech is fluent. Motor exam as listed above. Psychiatric: Judgment intact, Mood & affect appropriate for pt's clinical situation. Dermatologic: No rashes or ulcers noted.  No cellulitis or open wounds. Lymph : No Cervical, Axillary, or Inguinal lymphadenopathy.  Diagnostic Studies 1.  CT angiogram demonstrates increased size in the infarct in the right upper pole with increased abnormality of the renal artery. There are now changes that are clearly consistent with dissection which extends into the main renal artery and then into the first branch extending into the right upper pole.    CBC Lab Results  Component Value Date   WBC 10.2 05/26/2015   HGB 15.8 05/26/2015   HCT 45.4 05/26/2015   MCV 92.8 05/26/2015   PLT 233 05/26/2015    BMET    Component Value Date/Time   NA 138 05/26/2015 0641   K 4.0 05/26/2015 0641   CL 103 05/26/2015 0641   CO2 27 05/26/2015 0641   GLUCOSE 124* 05/26/2015 0641   BUN 26* 05/26/2015 0641   CREATININE 1.46* 05/26/2015 0641    CALCIUM 9.5 05/26/2015 0641   GFRNONAA 56* 05/26/2015 0641   GFRAA >60 05/26/2015 0641   Estimated Creatinine Clearance: 71.4 mL/min (by C-G formula based on Cr of 1.46).  COAG Lab Results  Component Value Date   INR 1.00 05/26/2015   INR 0.98 05/16/2015    Radiology Ct Abdomen Pelvis W Contrast  05/26/2015   CLINICAL DATA:  Acute right-sided abdominal pain.  EXAM: CT ABDOMEN AND PELVIS WITH CONTRAST  TECHNIQUE: Multidetector CT imaging of the abdomen and pelvis was performed using the standard protocol following bolus administration of intravenous contrast.  CONTRAST:  1108mL OMNIPAQUE IOHEXOL 350 MG/ML SOLN  COMPARISON:  CT scan of May 16, 2015.  FINDINGS: Multilevel degenerative disc disease is noted in the lumbar spine. Visualized lung bases appear normal.  No gallstones are noted. The liver, spleen and pancreas appear normal. Adrenal glands appear normal. There is no evidence of abdominal aortic aneurysm or dissection. No hydronephrosis or renal obstruction is noted. No renal or ureteral calculi are noted. Left kidney appears normal. There is area of non enhancement involving the upper and middle pole of the right kidney which is increased in size compared to prior exam, consistent with worsening renal infarction. Embolic etiology cannot be excluded. On the delayed post-contrast images, there is decreased enhancement of the posterior margin of the right kidney as well, suggesting evidence of early infarction in this area. Retro aortic left renal vein is noted. Left renal artery is widely patent. There appears to be significant long length narrowing within the right renal artery which has significantly progressed since prior exam, and is concerning for dissection or possibly thrombus.  There is no evidence of bowel obstruction. The appendix appears normal. No abnormal fluid collection is noted. Sigmoid diverticulosis is noted without inflammation. Urinary bladder appears normal. No significant  adenopathy is noted.  IMPRESSION: Compared to prior exam, there is a larger area of infarction involving the upper and middle pole of the right kidney, with delayed post-contrast images suggesting early ischemic changes involving the entire posterior portion of the right kidney. There is noted severe luminal narrowing throughout almost the entire right renal  artery which is increased compared to prior exam, concerning for possible dissection or thrombus. Critical Value/emergent results were called by telephone at the time of interpretation on 05/26/2015 at 7:28 am to Dr. Owens Shark, who verbally acknowledged these results.   Electronically Signed   By: Marijo Conception, M.D.   On: 05/26/2015 07:29   Ct Abdomen Pelvis W Contrast  05/16/2015   CLINICAL DATA:  Abrupt onset severe right lower quadrant pain. Initial encounter.  EXAM: CT ABDOMEN AND PELVIS WITH CONTRAST  TECHNIQUE: Multidetector CT imaging of the abdomen and pelvis was performed using the standard protocol following bolus administration of intravenous contrast.  CONTRAST:  167mL OMNIPAQUE IOHEXOL 300 MG/ML  SOLN  COMPARISON:  None.  FINDINGS: BODY WALL: No contributory findings.  LOWER CHEST: Calcified granulomas in the left lower lobe.  ABDOMEN/PELVIS:  Liver: No focal abnormality.  Biliary: No evidence of biliary obstruction or stone.  Pancreas: Unremarkable.  Spleen: Unremarkable.  Adrenals: Unremarkable.  Kidneys and ureters: There is a wedge-shaped well-defined area of nonenhancement involving the upper pole right kidney, likely capsular/ upper lobar branch occlusion (this vessel is relatively hypoenhancing). Additionally, there is focal luminal narrowing the level of the right renal artery bifurcation, near were the capsular branch arises, suggesting thrombus or focal dissection. No overt FMD changes, although not a CTA. No additional infarct noted.  Bladder: Unremarkable.  Reproductive: No pathologic findings.  Bowel: No obstruction. No appendicitis.   Retroperitoneum: No mass or adenopathy.  Peritoneum: No ascites or pneumoperitoneum.  Vascular: No acute abnormality.  OSSEOUS: No acute abnormalities. Extensive degenerative disc disease and facet arthropathy for age, focally advanced at L2-3 where there is retrolisthesis and advanced disc narrowing.  These results were called by telephone at the time of interpretation on 05/16/2015 at 2:20 pm to Dr. Lenise Arena , who verbally acknowledged these results.  IMPRESSION: Abnormal upper pole pole right kidney most consistent with renal infarct (pyelonephritis can have a similar appearance and UA recommended). Focal right renal artery bifurcation luminal narrowing suspicious for dissection/thrombosis, with capsular branch involvement explaining the infarct. Based on location and patient age, question underlying fibromuscular dysplasia. Renal CTA could further evaluate as clinically indicated.   Electronically Signed   By: Monte Fantasia M.D.   On: 05/16/2015 14:23   Ct Angio Chest Aorta W/cm &/or Wo/cm  05/17/2015   CLINICAL DATA:  Left anterior chest pain since this morning. Patient admitted for abdominal pain due to right renal infarct. Abdominal pain improved on heparin drip.  EXAM: CT ANGIOGRAPHY CHEST WITH CONTRAST  TECHNIQUE: Multidetector CT imaging of the chest was performed using the standard protocol during bolus administration of intravenous contrast. Multiplanar CT image reconstructions and MIPs were obtained to evaluate the vascular anatomy.  CONTRAST:  92mL OMNIPAQUE IOHEXOL 350 MG/ML SOLN  COMPARISON:  CT abdomen/ pelvis 05/16/2015.  FINDINGS: Lungs are adequately inflated without consolidation or effusion. There is subtle bibasilar dependent atelectasis. Tiny calcification along the left major fissure and adjacent the right minor fissure as well as focal sub cm pleural-based calcification over the medial left lung base. Airways are normal.  Heart is normal in size. There is no evidence of  pulmonary emboli. Thoracic aorta is normal in caliber without evidence of dissection or aneurysm. Takeoff of the great vessels is normal. There are a couple small calcified mediastinal left hilar lymph nodes. Remaining mediastinal structures are within normal.  Images through the upper abdomen again demonstrate low-attenuation over the upper pole cortex of the right kidney thought to  represent infarct. Minimal stranding of the perirenal fat anterior to the right mid to upper pole. The remainder of the exam is not significantly changed.  Review of the MIP images confirms the above findings.  IMPRESSION: No evidence pulmonary embolism. No evidence of thoracic aortic dissection or aneurysm.  No acute cardiopulmonary disease. Evidence of prior granulomas disease.  Stable changes over the upper pole of the right kidney thought to represent infarction and less likely pyelonephritis.   Electronically Signed   By: Marin Olp M.D.   On: 05/17/2015 12:21    Assessment/Plan 1.  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 The hope being beta blockade will reduce wall tension and prevent any further propagation of his dissection.          I have discussed options such as re-angiography and possible intervention however given that we appeared to have completed the infarction of the upper pole and there is no evidence by CT scan of flow limitation to the lower pole I do not believe that intervention would be helpful at this time and could potentially cause catastrophic loss of the kidney. I  believe the risks of intervention outweigh the benefits. I have discussed this with the patient.            He will remain at bed rest for the first 24 hours and then his activities will be liberalized Eliquis be restarted tomorrow and I would anticipate discharge in 2-3 days   Schnier, Dolores Lory, MD  05/27/2015 7:29 AM

## 2015-05-27 NOTE — Progress Notes (Signed)
[facility]  Daily Progress Note   Subjective  - Still having right flank pain  Objective Filed Vitals:   05/26/15 2044 05/26/15 2320 05/27/15 0748 05/27/15 1522  BP: 147/77 127/78 140/77 126/81  Pulse:  72 79 79  Temp:  98.2 F (36.8 C) 98.6 F (37 C) 98.5 F (36.9 C)  TempSrc:  Oral Oral Oral  Resp:  16 18 18   Height:      Weight:      SpO2:  96% 96% 96%    Intake/Output Summary (Last 24 hours) at 05/27/15 1916 Last data filed at 05/27/15 1838  Gross per 24 hour  Intake 281.61 ml  Output    925 ml  Net -643.39 ml    PULM  Normal effort , no use of accessory muscles CV  No JVD, RRR Abd      No distended, nontender VASC   No edema of the arms  Laboratory CBC    Component Value Date/Time   WBC 15.0* 05/27/2015 0752   HGB 15.6 05/27/2015 0752   HCT 45.8 05/27/2015 0752   PLT 221 05/27/2015 0752    BMET    Component Value Date/Time   NA 136 05/27/2015 0752   K 3.6 05/27/2015 0752   CL 100* 05/27/2015 0752   CO2 27 05/27/2015 0752   GLUCOSE 111* 05/27/2015 0752   BUN 21* 05/27/2015 0752   CREATININE 1.56* 05/27/2015 0752   CALCIUM 9.0 05/27/2015 0752   GFRNONAA 52* 05/27/2015 0752   GFRAA 60* 05/27/2015 0752    Assessment/Planning:   1. 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 The hope being beta blockade will reduce wall tension and prevent any further propagation of his dissection. I will plan for DC tomorrow    Katha Cabal  05/27/2015, 7:16  PM

## 2015-05-27 NOTE — Progress Notes (Addendum)
ANTICOAGULATION CONSULT NOTE - Initial Consult  Pharmacy Consult for heparin drip dosing and monitoring Indication: Renal infarct  No Known Allergies  Patient Measurements: Height: 6\' 1"  (185.4 cm) Weight: 197 lb (89.359 kg) IBW/kg (Calculated) : 79.9 Heparin Dosing Weight: 89.4 kg  Vital Signs: Temp: 98.2 F (36.8 C) (09/08 2320) Temp Source: Oral (09/08 2320) BP: 127/78 mmHg (09/08 2320) Pulse Rate: 72 (09/08 2320)  Labs:  Recent Labs  05/26/15 0641 05/26/15 1451 05/27/15 0015  HGB 15.8  --   --   HCT 45.4  --   --   PLT 233  --   --   APTT 33 51* 63*  LABPROT 13.4  --   --   INR 1.00  --   --   HEPARINUNFRC  --  1.80* 1.34*  CREATININE 1.46*  --   --     Estimated Creatinine Clearance: 71.4 mL/min (by C-G formula based on Cr of 1.46).   Medical History: Past Medical History  Diagnosis Date  . Reflux   . GERD (gastroesophageal reflux disease)     Medications:  Scheduled:  . aspirin EC  81 mg Oral Daily  . docusate sodium  100 mg Oral Daily  . Influenza vac split quadrivalent PF  0.5 mL Intramuscular Tomorrow-1000  . metoprolol tartrate  12.5 mg Oral BID  . pantoprazole  40 mg Oral Daily    Assessment:  Patient was started on heparin drip this morning for a renal infarct. Physician ordered an initial rate of 900 units/hr with no bolus this morning. Dose was clarified with physician and heparin gtt was started at ~08:40 this morning. Pharmacy was then consulted to monitor and dose heparin. Patient did report taking apixaban 5 mg dose this morning.   Goal of Therapy:  Heparin level 0.3-0.7 units/ml Monitor platelets by anticoagulation protocol: Yes   Plan:  Anti-Xa level ordered for 15:00 today (9/8), which is 6 hours after heparin drip was started. Pharmacy will follow up with anti-Xa level and adjust heparin drip as needed to maintain therapeutic anti-Xa of 0.3 - 0.7 units/mL.   Pharmacy will continue to monitor platelets per protocol.   9/8:  HL  @ 15:00 = 1.8,  APTT = 51  Will increase drip rate to 1000 units/hr and recheck HL and aPTT 6 hrs after rate change on 9/8 @ 23:00.   9/9 00:00 aPTT 63, anti-Xa 1.34. Increase rate to 1100 units/hr. Recheck aPTT, anti-Xa, and CBC in 6 hours.  Thank you for the consult.   Johnny Robinson S 05/27/2015,1:51 AM

## 2015-05-27 NOTE — Progress Notes (Signed)
ANTICOAGULATION CONSULT NOTE - FOLLOW UP   Pharmacy Consult for heparin drip dosing and monitoring Indication: Renal infarct  No Known Allergies  Patient Measurements: Height: 6\' 1"  (185.4 cm) Weight: 197 lb (89.359 kg) IBW/kg (Calculated) : 79.9 Heparin Dosing Weight: 89.4 kg  Vital Signs: Temp: 98.6 F (37 C) (09/09 0748) Temp Source: Oral (09/09 0748) BP: 140/77 mmHg (09/09 0748) Pulse Rate: 79 (09/09 0748)  Labs:  Recent Labs  05/26/15 0641 05/26/15 1451 05/27/15 0015 05/27/15 0752  HGB 15.8  --   --  15.6  HCT 45.4  --   --  45.8  PLT 233  --   --  221  APTT 33 51* 63* 60*  LABPROT 13.4  --   --   --   INR 1.00  --   --   --   HEPARINUNFRC  --  1.80* 1.34* 1.04*  CREATININE 1.46*  --   --  1.56*    Estimated Creatinine Clearance: 66.9 mL/min (by C-G formula based on Cr of 1.56).   Medical History: Past Medical History  Diagnosis Date  . Reflux   . GERD (gastroesophageal reflux disease)     Medications:  Scheduled:  . aspirin EC  81 mg Oral Daily  . docusate sodium  100 mg Oral Daily  . Influenza vac split quadrivalent PF  0.5 mL Intramuscular Tomorrow-1000  . metoprolol tartrate  12.5 mg Oral BID  . pantoprazole  40 mg Oral Daily    Assessment:  Patient was started on heparin drip this morning for a renal infarct. Physician ordered an initial rate of 900 units/hr with no bolus this morning. Dose was clarified with physician and heparin gtt was started at ~08:40 this morning. Pharmacy was then consulted to monitor and dose heparin. Patient did report taking apixaban 5 mg dose this morning.   Goal of Therapy:  Heparin level 0.3-0.7 units/ml Monitor platelets by anticoagulation protocol: Yes   Plan:  Anti-Xa level ordered for 15:00 today (9/8), which is 6 hours after heparin drip was started. Pharmacy will follow up with anti-Xa level and adjust heparin drip as needed to maintain therapeutic anti-Xa of 0.3 - 0.7 units/mL.   Pharmacy will continue  to monitor platelets per protocol.   9/8:  HL @ 15:00 = 1.8,  APTT = 51  Will increase drip rate to 1000 units/hr and recheck HL and aPTT 6 hrs after rate change on 9/8 @ 23:00.   9/9 00:00 aPTT 63, anti-Xa 1.34. Increase rate to 1100 units/hr. Recheck aPTT, anti-Xa, and CBC in 6 hours.  9/9 08:00 Anti Xa level = 1.04 and APTT resulted @ 60. Will increase heparin gtt to 1200 units/hr. Will order another APTT and Anti xa level @ 16:00. Goal range APTT is 68-109   Thank you for the consult.   Laytoya Ion D 05/27/2015,9:33 AM

## 2015-05-28 LAB — CBC
HCT: 42 % (ref 40.0–52.0)
HEMOGLOBIN: 14.8 g/dL (ref 13.0–18.0)
MCH: 32.4 pg (ref 26.0–34.0)
MCHC: 35.1 g/dL (ref 32.0–36.0)
MCV: 92.2 fL (ref 80.0–100.0)
PLATELETS: 204 10*3/uL (ref 150–440)
RBC: 4.56 MIL/uL (ref 4.40–5.90)
RDW: 11.6 % (ref 11.5–14.5)
WBC: 11 10*3/uL — AB (ref 3.8–10.6)

## 2015-05-28 LAB — APTT: APTT: 77 s — AB (ref 24–36)

## 2015-05-28 LAB — HEPARIN LEVEL (UNFRACTIONATED): HEPARIN UNFRACTIONATED: 0.83 [IU]/mL — AB (ref 0.30–0.70)

## 2015-05-28 MED ORDER — ASPIRIN 81 MG PO TBEC: 81.0000 mg | DELAYED_RELEASE_TABLET | Freq: Every day | ORAL | Status: DC

## 2015-05-28 MED ORDER — METOPROLOL TARTRATE 25 MG PO TABS: 12.5000 mg | ORAL_TABLET | Freq: Two times a day (BID) | ORAL | Status: DC

## 2015-05-28 NOTE — Discharge Summary (Signed)
[facility]    Discharge Summary    Patient ID:  Johnny Robinson MRN: [id number] DOB/AGE: November 12, 1965 49 y.o.  Admit date: 05/26/2015 Discharge date: 05/28/2015 Date of Surgery: No surgeries  Admission Diagnosis: r kidney pain same last week  Discharge Diagnoses:  r kidney pain same last week  Secondary Diagnoses: Past Medical History  Diagnosis Date  . Reflux   . GERD (gastroesophageal reflux disease)       Discharged Condition: good  HPI:  The patient returned to the emergency room with increasing pain CT scan found an infarct of the right kidney that had extended and he was subsequently admitted and started on intravenous heparin and given pain medication.  Hospital Course:  [name] is a 49 y.o. male is S/P Right  Extubated: POD # 0 Physical exam: Right flank pain Post-op wounds no postoperative wounds  Pt. Ambulating, voiding and taking PO diet without difficulty. Pt pain controlled with PO pain meds. Labs as below Complications:none  Consults:     Significant Diagnostic Studies: CBC Lab Results  Component Value Date   WBC 11.0* 05/28/2015   HGB 14.8 05/28/2015   HCT 42.0 05/28/2015   MCV 92.2 05/28/2015   PLT 204 05/28/2015    BMET    Component Value Date/Time   NA 136 05/27/2015 0752   K 3.6 05/27/2015 0752   CL 100* 05/27/2015 0752   CO2 27 05/27/2015 0752   GLUCOSE 111* 05/27/2015 0752   BUN 21* 05/27/2015 0752   CREATININE 1.56* 05/27/2015 0752   CALCIUM 9.0 05/27/2015 0752   GFRNONAA 52* 05/27/2015 0752   GFRAA 60* 05/27/2015 0752   COAG Lab Results  Component Value Date   INR 1.00 05/26/2015   INR 0.98 05/16/2015     Disposition:  Discharge to :Home Discharge Instructions    Call MD for:  redness, tenderness, or signs of infection (pain, swelling, bleeding, redness, odor or green/yellow discharge around incision site)    Complete by:  As directed      Call MD for:  severe or increased pain, loss or  decreased feeling  in affected limb(s)    Complete by:  As directed      Call MD for:  temperature >100.5    Complete by:  As directed      Discharge instructions    Complete by:  As directed   Light activities for the first week     Resume previous diet    Complete by:  As directed             Medication List    TAKE these medications        apixaban 5 MG Tabs tablet  Commonly known as:  ELIQUIS  Take 1 tablet (5 mg total) by mouth 2 (two) times daily.     aspirin 81 MG EC tablet  Take 1 tablet (81 mg total) by mouth daily.     HYDROcodone-acetaminophen 10-325 MG per tablet  Commonly known as:  NORCO  Take 1 tablet by mouth every 4 (four) hours as needed for moderate pain.     metoprolol tartrate 25 MG tablet  Commonly known as:  LOPRESSOR  Take 0.5 tablets (12.5 mg total) by mouth 2 (two) times daily.     omeprazole 20 MG capsule  Commonly known as:  PRILOSEC  Take 1 capsule by mouth daily.       Verbal and written Discharge instructions given to the patient. Wound care  per Discharge AVS     Follow-up Information    Follow up with Jaide Hillenburg, Dolores Lory, MD In 2 weeks.   Specialties:  Vascular Surgery, Cardiology, Radiology, Vascular Surgery   Why:  follow up after hospital stay, schedule with right renal artery duplex   Contact information:   [place] [id number] [phone]       Signed: Katha Cabal, MD  05/28/2015, 9:33 AM

## 2015-05-28 NOTE — Progress Notes (Signed)
ANTICOAGULATION CONSULT NOTE - FOLLOW UP   Pharmacy Consult for heparin drip dosing and monitoring Indication: Renal infarct  No Known Allergies  Patient Measurements: Height: 6\' 1"  (185.4 cm) Weight: 197 lb (89.359 kg) IBW/kg (Calculated) : 79.9 Heparin Dosing Weight: 89.4 kg  Vital Signs: Temp: 98.2 F (36.8 C) (09/09 2315) Temp Source: Oral (09/09 2315) BP: 132/82 mmHg (09/09 2315) Pulse Rate: 75 (09/09 2315)  Labs:  Recent Labs  05/26/15 0641  05/27/15 0752 05/27/15 1552 05/28/15 0015  HGB 15.8  --  15.6  --   --   HCT 45.4  --  45.8  --   --   PLT 233  --  221  --   --   APTT 33  < > 60* 67* 77*  LABPROT 13.4  --   --   --   --   INR 1.00  --   --   --   --   HEPARINUNFRC  --   < > 1.04* 0.79* 0.83*  CREATININE 1.46*  --  1.56*  --   --   < > = values in this interval not displayed.  Estimated Creatinine Clearance: 66.9 mL/min (by C-G formula based on Cr of 1.56).   Medical History: Past Medical History  Diagnosis Date  . Reflux   . GERD (gastroesophageal reflux disease)     Medications:  Scheduled:  . apixaban  5 mg Oral BID  . aspirin EC  81 mg Oral Daily  . docusate sodium  100 mg Oral Daily  . Influenza vac split quadrivalent PF  0.5 mL Intramuscular Tomorrow-1000  . metoprolol tartrate  12.5 mg Oral BID  . pantoprazole  40 mg Oral Daily    Assessment:  Patient was started on heparin drip this morning for a renal infarct. Physician ordered an initial rate of 900 units/hr with no bolus this morning. Dose was clarified with physician and heparin gtt was started at ~08:40 this morning. Pharmacy was then consulted to monitor and dose heparin. Patient did report taking apixaban 5 mg dose this morning.   Goal of Therapy:  Heparin level 0.3-0.7 units/ml Monitor platelets by anticoagulation protocol: Yes   Plan:  Anti-Xa level ordered for 15:00 today (9/8), which is 6 hours after heparin drip was started. Pharmacy will follow up with anti-Xa level  and adjust heparin drip as needed to maintain therapeutic anti-Xa of 0.3 - 0.7 units/mL.   Pharmacy will continue to monitor platelets per protocol.   9/8:  HL @ 15:00 = 1.8,  APTT = 51  Will increase drip rate to 1000 units/hr and recheck HL and aPTT 6 hrs after rate change on 9/8 @ 23:00.   9/9 00:00 aPTT 63, anti-Xa 1.34. Increase rate to 1100 units/hr. Recheck aPTT, anti-Xa, and CBC in 6 hours.  9/9 08:00 Anti Xa level = 1.04 and APTT resulted @ 60. Will increase heparin gtt to 1200 units/hr. Will order another APTT and Anti xa level @ 16:00. Goal range APTT is 68-109   9/9:  HL @ 16:00 = 0.79         APTT @ 16:00 = 67 Will increase drip rate to 1300 units/hr and recheck aPTT and HL 6 hrs after rate change on 9/10 @ 00:00.   9/10 00:15 aPTT 77, anti-Xa 0.83. Continue current regimen. Anti-Xa and aPTT ordered for 9/11 AM. Per MD note heparin drip to d/c and start Eliquis 9/10.   Thank you for the consult.   Delainey Winstanley S 05/28/2015,1:04  AM

## 2015-05-28 NOTE — Progress Notes (Signed)
Pt vss, alert and oriented x4, no complaints of nausea or pain.  Pt received discharge education and verbalized understanding.  Pt left unit walking per pt request, gait was steady and no pain reported.

## 2015-05-30 ENCOUNTER — Encounter: Payer: Self-pay | Admitting: *Deleted

## 2015-05-30 ENCOUNTER — Encounter (HOSPITAL_COMMUNITY): Payer: Self-pay | Admitting: Family Medicine

## 2015-05-30 ENCOUNTER — Inpatient Hospital Stay (HOSPITAL_COMMUNITY)
Admission: EM | Admit: 2015-05-30 | Discharge: 2015-05-30 | DRG: 313 | Discharge status: Against Medical Advice | Payer: 59 | Attending: Emergency Medicine | Admitting: Emergency Medicine

## 2015-05-30 ENCOUNTER — Emergency Department (HOSPITAL_COMMUNITY): Payer: 59

## 2015-05-30 DIAGNOSIS — Z809 Family history of malignant neoplasm, unspecified: Secondary | ICD-10-CM | POA: Diagnosis not present

## 2015-05-30 DIAGNOSIS — Z7901 Long term (current) use of anticoagulants: Secondary | ICD-10-CM | POA: Insufficient documentation

## 2015-05-30 DIAGNOSIS — Z791 Long term (current) use of non-steroidal anti-inflammatories (NSAID): Secondary | ICD-10-CM | POA: Insufficient documentation

## 2015-05-30 DIAGNOSIS — I10 Essential (primary) hypertension: Secondary | ICD-10-CM | POA: Insufficient documentation

## 2015-05-30 DIAGNOSIS — N179 Acute kidney failure, unspecified: Secondary | ICD-10-CM | POA: Diagnosis present

## 2015-05-30 DIAGNOSIS — R079 Chest pain, unspecified: Principal | ICD-10-CM | POA: Diagnosis present

## 2015-05-30 DIAGNOSIS — K219 Gastro-esophageal reflux disease without esophagitis: Secondary | ICD-10-CM | POA: Diagnosis present

## 2015-05-30 DIAGNOSIS — Z79899 Other long term (current) drug therapy: Secondary | ICD-10-CM

## 2015-05-30 DIAGNOSIS — K5730 Diverticulosis of large intestine without perforation or abscess without bleeding: Secondary | ICD-10-CM | POA: Diagnosis not present

## 2015-05-30 DIAGNOSIS — M47814 Spondylosis without myelopathy or radiculopathy, thoracic region: Secondary | ICD-10-CM | POA: Insufficient documentation

## 2015-05-30 DIAGNOSIS — R1031 Right lower quadrant pain: Secondary | ICD-10-CM | POA: Insufficient documentation

## 2015-05-30 DIAGNOSIS — Z79891 Long term (current) use of opiate analgesic: Secondary | ICD-10-CM | POA: Diagnosis not present

## 2015-05-30 DIAGNOSIS — Z841 Family history of disorders of kidney and ureter: Secondary | ICD-10-CM | POA: Diagnosis not present

## 2015-05-30 DIAGNOSIS — N280 Ischemia and infarction of kidney: Secondary | ICD-10-CM | POA: Insufficient documentation

## 2015-05-30 DIAGNOSIS — N289 Disorder of kidney and ureter, unspecified: Secondary | ICD-10-CM | POA: Insufficient documentation

## 2015-05-30 DIAGNOSIS — R071 Chest pain on breathing: Secondary | ICD-10-CM | POA: Diagnosis not present

## 2015-05-30 DIAGNOSIS — M5136 Other intervertebral disc degeneration, lumbar region: Secondary | ICD-10-CM | POA: Diagnosis not present

## 2015-05-30 DIAGNOSIS — Z9889 Other specified postprocedural states: Secondary | ICD-10-CM | POA: Diagnosis not present

## 2015-05-30 DIAGNOSIS — R0781 Pleurodynia: Secondary | ICD-10-CM | POA: Diagnosis not present

## 2015-05-30 DIAGNOSIS — I701 Atherosclerosis of renal artery: Secondary | ICD-10-CM | POA: Diagnosis not present

## 2015-05-30 LAB — I-STAT CHEM 8, ED
BUN: 16 mg/dL (ref 6–20)
CALCIUM ION: 1.21 mmol/L (ref 1.12–1.23)
Chloride: 96 mmol/L — ABNORMAL LOW (ref 101–111)
Creatinine, Ser: 1.5 mg/dL — ABNORMAL HIGH (ref 0.61–1.24)
Glucose, Bld: 102 mg/dL — ABNORMAL HIGH (ref 65–99)
HCT: 55 % — ABNORMAL HIGH (ref 39.0–52.0)
Hemoglobin: 18.7 g/dL — ABNORMAL HIGH (ref 13.0–17.0)
Potassium: 3.5 mmol/L (ref 3.5–5.1)
SODIUM: 138 mmol/L (ref 135–145)
TCO2: 30 mmol/L (ref 0–100)

## 2015-05-30 LAB — LIPASE, BLOOD: Lipase: 24 U/L (ref 22–51)

## 2015-05-30 LAB — BASIC METABOLIC PANEL
Anion gap: 9 (ref 5–15)
BUN: 13 mg/dL (ref 6–20)
CALCIUM: 9.8 mg/dL (ref 8.9–10.3)
CO2: 31 mmol/L (ref 22–32)
CREATININE: 1.45 mg/dL — AB (ref 0.61–1.24)
Chloride: 98 mmol/L — ABNORMAL LOW (ref 101–111)
GFR calc non Af Amer: 56 mL/min — ABNORMAL LOW (ref >60)
GLUCOSE: 113 mg/dL — AB (ref 65–99)
Potassium: 3.7 mmol/L (ref 3.5–5.1)
Sodium: 138 mmol/L (ref 135–145)

## 2015-05-30 LAB — CBC
HCT: 43.7 % (ref 39.0–52.0)
Hemoglobin: 15.3 g/dL (ref 13.0–17.0)
MCH: 31.9 pg (ref 26.0–34.0)
MCHC: 35 g/dL (ref 30.0–36.0)
MCV: 91 fL (ref 78.0–100.0)
PLATELETS: 288 10*3/uL (ref 150–400)
RBC: 4.8 MIL/uL (ref 4.22–5.81)
RDW: 11.3 % — AB (ref 11.5–15.5)
WBC: 10.9 10*3/uL — ABNORMAL HIGH (ref 4.0–10.5)

## 2015-05-30 LAB — I-STAT TROPONIN, ED: TROPONIN I, POC: 0 ng/mL (ref 0.00–0.08)

## 2015-05-30 MED ORDER — HYDROMORPHONE HCL 1 MG/ML IJ SOLN
1.0000 mg | Freq: Once | INTRAMUSCULAR | Status: AC
Start: 2015-05-30 — End: 2015-05-30
  Administered 2015-05-30: 1 mg via INTRAVENOUS
  Filled 2015-05-30: qty 1

## 2015-05-30 MED ORDER — LISINOPRIL 10 MG PO TABS: 10.0000 mg | ORAL_TABLET | Freq: Every day | ORAL | Status: DC

## 2015-05-30 MED ORDER — LABETALOL HCL 5 MG/ML IV SOLN: 20.0000 mg | Freq: Once | INTRAVENOUS | Status: AC

## 2015-05-30 MED ORDER — PANTOPRAZOLE SODIUM 40 MG IV SOLR
40.0000 mg | Freq: Once | INTRAVENOUS | Status: AC
  Administered 2015-05-30: 40 mg via INTRAVENOUS
  Filled 2015-05-30: qty 40

## 2015-05-30 MED ORDER — LABETALOL HCL 5 MG/ML IV SOLN
20.0000 mg | Freq: Once | INTRAVENOUS | Status: AC
  Administered 2015-05-30: 20 mg via INTRAVENOUS
  Filled 2015-05-30: qty 4

## 2015-05-30 MED ORDER — HYDRALAZINE HCL 20 MG/ML IJ SOLN: 10.0000 mg | Freq: Four times a day (QID) | INTRAMUSCULAR | Status: DC | PRN

## 2015-05-30 MED ORDER — HYDROMORPHONE HCL 1 MG/ML IJ SOLN
1.0000 mg | Freq: Once | INTRAMUSCULAR | Status: AC
  Administered 2015-05-30: 1 mg via INTRAVENOUS
  Filled 2015-05-30: qty 1

## 2015-05-30 MED ORDER — ONDANSETRON HCL 4 MG/2ML IJ SOLN
4.0000 mg | Freq: Once | INTRAMUSCULAR | Status: AC
  Administered 2015-05-30: 4 mg via INTRAVENOUS
  Filled 2015-05-30: qty 2

## 2015-05-30 MED ORDER — SODIUM CHLORIDE 0.9 % IV SOLN
Freq: Once | INTRAVENOUS | Status: AC
  Administered 2015-05-30: 19:00:00 via INTRAVENOUS

## 2015-05-30 MED ORDER — IOHEXOL 350 MG/ML SOLN
100.0000 mL | Freq: Once | INTRAVENOUS | Status: AC | PRN
  Administered 2015-05-30: 100 mL via INTRAVENOUS

## 2015-05-30 MED ORDER — HYDROMORPHONE HCL 1 MG/ML IJ SOLN
1.0000 mg | INTRAMUSCULAR | Status: DC | PRN
  Administered 2015-05-30: 1 mg via INTRAVENOUS
  Filled 2015-05-30: qty 1

## 2015-05-30 NOTE — ED Notes (Signed)
Pt O2 noted to be 92% RA when entering room. Placed on 3L [place] and sats up to 97%.

## 2015-05-30 NOTE — ED Notes (Signed)
Spoke with patient via phone to state that he does have a bed assignment.  Patient stated that he problem was that his nurse was not available to him and that no one came in to address his pain.  Patient stated that he was going to [facility] for his chest pain and hung up on this RN as I was trying to speak to him.  Dr Eliseo Squires made aware of situation.

## 2015-05-30 NOTE — ED Notes (Signed)
Eliseo Squires, MD made aware.

## 2015-05-30 NOTE — ED Notes (Signed)
Pt has chest pain since this afternoon.  States it hurts to take a deep breath.  Pt seen at [facility] [place] ER tonight and left AMA.  Pt had a renal dissection 2 weeks ago.  Pt treated by dr Lysbeth Galas, sparks, and lateef.  Nausea improved tonight with zofran.  Pt alert.  Skin warm and dry.

## 2015-05-30 NOTE — H&P (Addendum)
[facility] History and Physical  CLIDE REMMERS CLE:[id number] DOB: 11-19-1965 DOA: 05/30/2015  Referring physician: er PCP: Idelle Crouch, MD   Chief Complaint: chest pressure  HPI: Johnny Robinson is a 49 y.o. male  With recent past medical history of renal infarct.  He was hospitalized x 2 at [facility].  First was 8/29-8/31 where his renal infarct was diagnosed, he was placed on eliquis and d/c'd.  He returned on 9/8-9/10 when he had an extension of his renal infarct. Today, while resting in bed he  Began to have chest pressure-- this caused him to become short of breath as he was unable to take a deep breath without pain.    Upon arrival to the ER, his BP was elevated (150s) above his normal (110s).  CTA done that was similar to previous at [facility].  Pain does not radiation anywhere currently.  At one point, he did have some should blade radiation.  Pain is relieved with IV dilaudid and lasts about 1 hour.  No fever, no chills, no dysuria, no swelling, no head ache.  No heavy lifting.  Cardiology was consulted.     Review of Systems:  All systems reviewed, negative unless stated above    Past Medical History  Diagnosis Date  . Reflux   . GERD (gastroesophageal reflux disease)    Past Surgical History  Procedure Laterality Date  . Back surgery    . Peripheral vascular catheterization Right 05/18/2015    Procedure: Renal Angiography;  Surgeon: Algernon Huxley, MD;  Location: [place] [facility];  Service: Cardiovascular;  Laterality: Right;  . Peripheral vascular catheterization Bilateral 05/18/2015    Procedure: Renal Intervention;  Surgeon: Algernon Huxley, MD;  Location: [place] [facility];  Service: Cardiovascular;  Laterality: Bilateral;   Social History:  reports that he has never smoked. He has never used smokeless tobacco. He reports that he drinks alcohol. He reports that he does not use illicit drugs.  No Known Allergies  Family History  Problem Relation Age of Onset  .  Nephrolithiasis Mother   . Cancer Father     Prior to Admission medications   Medication Sig Start Date End Date Taking? Authorizing Provider  apixaban (ELIQUIS) 5 MG TABS tablet Take 1 tablet (5 mg total) by mouth 2 (two) times daily. 05/18/15  Yes Idelle Crouch, MD  HYDROcodone-acetaminophen [facility]) 10-325 MG per tablet Take 1 tablet by mouth every 4 (four) hours as needed for moderate pain. 05/18/15  Yes Idelle Crouch, MD  ibuprofen (ADVIL,MOTRIN) 200 MG tablet Take 400-600 mg by mouth every 6 (six) hours as needed for headache or mild pain.   Yes Historical Provider, MD  metoprolol tartrate (LOPRESSOR) 25 MG tablet Take 0.5 tablets (12.5 mg total) by mouth 2 (two) times daily. 05/28/15  Yes Katha Cabal, MD  omeprazole (PRILOSEC) 20 MG capsule Take 1 capsule by mouth daily. 09/27/14  Yes Historical Provider, MD  ondansetron (ZOFRAN) 4 MG tablet Take 4 mg by mouth every 8 (eight) hours as needed for nausea or vomiting.   Yes Historical Provider, MD   Physical Exam: Filed Vitals:   05/30/15 2000 05/30/15 2015 05/30/15 2030 05/30/15 2045  BP: 152/86 154/87 150/85 153/93  Pulse: 77 74 78 82  Temp:      TempSrc:      Resp: 21 25 18 18   SpO2: 98% 91% 96% 94%    Wt Readings from Last 3 Encounters:  05/26/15 89.359 kg (197 lb)  05/16/15 87.862 kg (193 lb 11.2 oz)  03/14/15 86.183 kg (190 lb)    General:  Uncomfortable appearing Eyes: PERRL, normal lids, irises & conjunctiva ENT: grossly normal hearing, lips & tongue Neck: no LAD, masses or thyromegaly Cardiovascular: RRR, no m/r/g. No LE edema. Telemetry: SR, no arrhythmias  Respiratory: clear,  Abdomen: soft, ntnd Skin: no rash or induration seen on limited exam Musculoskeletal: grossly normal tone BUE/BLE Psychiatric: grossly normal mood and affect, speech fluent and appropriate Neurologic: grossly non-focal.          Labs on Admission:  Basic Metabolic Panel:  Recent Labs Lab 05/26/15 0641 05/27/15 0752  05/30/15 1659 05/30/15 1750  NA 138 136 138 138  K 4.0 3.6 3.7 3.5  CL 103 100* 98* 96*  CO2 27 27 31   --   GLUCOSE 124* 111* 113* 102*  BUN 26* 21* 13 16  CREATININE 1.46* 1.56* 1.45* 1.50*  CALCIUM 9.5 9.0 9.8  --    Liver Function Tests:  Recent Labs Lab 05/26/15 0641  AST 20  ALT 23  ALKPHOS 67  BILITOT 0.7  PROT 7.0  ALBUMIN 4.2   No results for input(s): LIPASE, AMYLASE in the last 168 hours. No results for input(s): AMMONIA in the last 168 hours. CBC:  Recent Labs Lab 05/26/15 0641 05/27/15 0752 05/28/15 0535 05/30/15 1659 05/30/15 1750  WBC 10.2 15.0* 11.0* 10.9*  --   HGB 15.8 15.6 14.8 15.3 18.7*  HCT 45.4 45.8 42.0 43.7 55.0*  MCV 92.8 92.4 92.2 91.0  --   PLT 233 221 204 288  --    Cardiac Enzymes: No results for input(s): CKTOTAL, CKMB, CKMBINDEX, TROPONINI in the last 168 hours.  BNP (last 3 results) No results for input(s): BNP in the last 8760 hours.  ProBNP (last 3 results) No results for input(s): PROBNP in the last 8760 hours.  CBG: No results for input(s): GLUCAP in the last 168 hours.  Radiological Exams on Admission: Dg Chest 2 View  05/30/2015   CLINICAL DATA:  Left side chest pain for 3 days  EXAM: CHEST  2 VIEW  COMPARISON:  05/17/2015  FINDINGS: Cardiomediastinal silhouette is stable. No acute infiltrate or pleural effusion. No pulmonary edema. Bony thorax is unremarkable.  IMPRESSION: No active cardiopulmonary disease.   Electronically Signed   By: Lahoma Crocker M.D.   On: 05/30/2015 18:33   Ct Angio Chest Aorta W/cm &/or Wo/cm  05/30/2015   CLINICAL DATA:  Mid chest pain onset 12 p.m. Recent admission for renal infarct. Clinical concern for aortic dissection.  EXAM: CT ANGIOGRAPHY CHEST, ABDOMEN AND PELVIS  TECHNIQUE: Multidetector CT imaging through the chest, abdomen and pelvis was performed using the standard protocol during bolus administration of intravenous contrast. Multiplanar reconstructed images and MIPs were obtained and  reviewed to evaluate the vascular anatomy.  CONTRAST:  19mL OMNIPAQUE IOHEXOL 350 MG/ML SOLN  COMPARISON:  05/26/2015; 05/17/2015  FINDINGS: CTA CHEST FINDINGS  Mediastinum/Nodes: Noncontrast images demonstrate no evidence of acute aortic intramural hematoma. Calcifications compatible with old granulomatous disease in the left hilar and infrahilar region, with small left lower lobe granulomas observed.  Systemic arterial phase images demonstrate no evidence of aortic dissection chest. Hypodensity within or along the right upper lobe pulmonary artery on image 36 of series 5 is attributed to streak artifact from the dense contrast medium in the SVC. No other pulmonary arterial hypodensity is identified.  No pathologic thoracic adenopathy.  There is mild cardiomegaly.  Lungs/Pleura: Calcified granuloma near the  minor fissure, image 49 series 6, in the lower part of the right upper lobe.  Bilateral dependent subsegmental atelectasis.  Musculoskeletal: Thoracic spondylosis.  Review of the MIP images confirms the above findings.  CTA ABDOMEN AND PELVIS FINDINGS  Hepatobiliary: Unremarkable  Pancreas: Unremarkable  Spleen: Unremarkable  Adrenals/Urinary Tract: Geographic infarct of the right kidney primarily involving the upper pole, distribution node not changed from prior.  Stomach/Bowel: Descending and sigmoid colon diverticulosis. There is some mild wall thickening throughout much of the colon, query low grade colitis.  Vascular/Lymphatic: There is abnormal luminal hypodensity in the left renal artery severely restricting the caliber of the rib right renal artery as shown on images 55 through 70 of series 8, with patency of a branch extending to the mid kidney and lower pole but thrombosis of the branch extending to the involved segment of the upper pole. This is a fairly similar appearance to the prior exam. I do not see beading and or irregularity of the contralateral renal artery so I favor a right renal artery  dissection as the cause. The celiac trunk, superior mesenteric artery, inferior mesenteric artery, and left renal artery appear unremarkable. There is very little in the way of atherosclerotic calcification. Pelvic vasculature unremarkable.  Reproductive: Unremarkable  Other: Trace free pelvic fluid, significance uncertain.  Musculoskeletal: Lumbar spondylosis and degenerative disc disease causing mild right foraminal impingement at L4-5 and mild left foraminal impingement at L4-5 and L5-S1. Disc bulge and spurring appear to cause mild central narrowing of the thecal sac at the L1-2 and L2-3 levels. Multilevel Schmorl's nodes.  Review of the MIP images confirms the above findings.  IMPRESSION: 1. 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 This is the only dissection observed; no aortic dissection or other vascular dissection in the chest, abdomen, or pelvis is seen. This could warrant intervention to prevent further occlusion of the right renal artery, correlate with prior disposition from 05/26/2015 with regard to vascular surgery or interventional radiology assessment. 2. Old granulomatous disease. 3. Descending and sigmoid colon diverticulosis. Possible low grade colitis given the slight diffuse colon wall thickening. 4. Lumbar spondylosis and degenerative disc disease causing mild multilevel impingement.   Electronically Signed   By: Van Clines M.D.   On: 05/30/2015 19:48   Ct Cta Abd/pel W/cm &/or W/o Cm  05/30/2015   CLINICAL DATA:  Mid chest pain onset 12 p.m. Recent admission for renal infarct. Clinical concern for aortic dissection.  EXAM: CT ANGIOGRAPHY CHEST, ABDOMEN AND PELVIS  TECHNIQUE: Multidetector CT imaging through the chest, abdomen and  pelvis was performed using the standard protocol during bolus administration of intravenous contrast. Multiplanar reconstructed images and MIPs were obtained and reviewed to evaluate the vascular anatomy.  CONTRAST:  174mL OMNIPAQUE IOHEXOL 350 MG/ML SOLN  COMPARISON:  05/26/2015; 05/17/2015  FINDINGS: CTA CHEST FINDINGS  Mediastinum/Nodes: Noncontrast images demonstrate no evidence of acute aortic intramural hematoma. Calcifications compatible with old granulomatous disease in the left hilar and infrahilar region, with small left lower lobe granulomas observed.  Systemic arterial phase images demonstrate no evidence of aortic dissection chest. Hypodensity within or along the right upper lobe pulmonary artery on image 36 of series 5 is attributed to streak artifact from the dense contrast medium in the SVC. No other pulmonary  arterial hypodensity is identified.  No pathologic thoracic adenopathy.  There is mild cardiomegaly.  Lungs/Pleura: Calcified granuloma near the minor fissure, image 49 series 6, in the lower part of the right upper lobe.  Bilateral dependent subsegmental atelectasis.  Musculoskeletal: Thoracic spondylosis.  Review of the MIP images confirms the above findings.  CTA ABDOMEN AND PELVIS FINDINGS  Hepatobiliary: Unremarkable  Pancreas: Unremarkable  Spleen: Unremarkable  Adrenals/Urinary Tract: Geographic infarct of the right kidney primarily involving the upper pole, distribution node not changed from prior.  Stomach/Bowel: Descending and sigmoid colon diverticulosis. There is some mild wall thickening throughout much of the colon, query low grade colitis.  Vascular/Lymphatic: There is abnormal luminal hypodensity in the left renal artery severely restricting the caliber of the rib right renal artery as shown on images 55 through 70 of series 8, with patency of a branch extending to the mid kidney and lower pole but thrombosis of the branch extending to the involved segment of the upper pole.  This is a fairly similar appearance to the prior exam. 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 The celiac trunk, superior mesenteric artery, inferior mesenteric artery, and left renal artery appear unremarkable. There is very little in the way of atherosclerotic calcification. Pelvic vasculature unremarkable.  Reproductive: Unremarkable  Other: Trace free pelvic fluid, significance uncertain.  Musculoskeletal: Lumbar spondylosis and degenerative disc disease causing mild right foraminal impingement at L4-5 and mild left foraminal impingement at L4-5 and L5-S1. Disc bulge and spurring appear to cause mild central narrowing of the thecal sac at the L1-2 and L2-3 levels. Multilevel Schmorl's nodes.  Review of the MIP images confirms the above findings.  IMPRESSION: 1. 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 This is the only dissection observed; no aortic dissection or other vascular dissection in the chest, abdomen, or pelvis is seen. This could warrant intervention to prevent further occlusion of the right renal artery, correlate with prior disposition from 05/26/2015 with regard to vascular surgery or interventional radiology assessment. 2. Old granulomatous disease. 3. Descending and sigmoid colon diverticulosis. Possible low grade colitis given the slight diffuse colon wall thickening. 4. Lumbar spondylosis and degenerative disc disease causing mild multilevel impingement.   Electronically Signed   By: Van Clines M.D.   On: 05/30/2015 19:48    EKG: Independently reviewed. NSR with LAE  Assessment/Plan Active Problems:   Chest pain   Chest pain due to  hypertensive urgency - continue PO BB -add ACE as HTN could be due to renal infarct- will need to closely monitor CR -PRN hydralazine Appreciate cardiology input  SOB - due to pain with deep breathing -no PNA seen -monitor  AKI -suspect now chronic as patient has infarcted kidney -recent IV dye so will need close monitoring  Recent renal infarct -follows with vascular surgery in [place]   Cardiology consult in ER by ER resident  Code Status: full DVT Prophylaxis: Family Communication: wife at bedside Disposition Plan: admit  Time spent: 27 min  Eulogio Bear [facility] Pager [phone]

## 2015-05-30 NOTE — ED Notes (Signed)
Pt here for sudden on set of chest pain a few hours ago. sts recent admission for renal infarct. Pt sent here by doctor concerns of aortic dissection.

## 2015-05-30 NOTE — Consult Note (Addendum)
Cardiology Consult Note SPARKS,JEFFREY D, MD No ref. provider found  Reason for consult: Chest pain  History of Present Illness (and review of medical records): Johnny Robinson is a 49 y.o. male who presents for evaluation of chest pain.  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  He was been on Eliquis and home pain meds since discharge.  He reports acute onset of chest pain today around 1230.  Pain was described as pressure sensation 10/10.  Pressure was midsternal and associated with shortness of breath.  He had mild nausea.  He denied diaphoresis or palpitations.  He had pain for 3-4 hours and called his wife who is an Therapist, sports and was then brought to the ED.  He was treated with IV dilaudid for pain.  He underwent CTA chest/abd/pel which revealed "similar distribution of renal infarct in the right kidney upper pole in right mid kidney, with irregular luminal narrowing and suspected dissection in the right renal artery, including the right kidney upper pole branch, but narrowing the main renal artery proximal to the lower pole branch".  No evidence of aortic dissection. He had negative troponin and no acute changes on ekg.  He is admitted to hospital service.  Previous diagnostic testing for coronary artery disease includes: echocardiogram. Previous history of cardiac disease includes None.  Coronary artery disease risk factors include: male gender.  Patient denies history of angina, arrhythmia, CHF, coronary artery disease, pericarditis, previous M.I. and valvular disease.  Review of Systems He states that this pain was more severe than abdominal pain that led to diagnosis of renal infarct.  He has never had this pain prior. Further review of systems was otherwise negative other than stated in HPI.  Patient Active Problem List   Diagnosis Date Noted  . Chest pain  05/30/2015  . 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 Reflux   . GERD (gastroesophageal reflux disease)     Past Surgical History  Procedure Laterality Date  . Back surgery    . Peripheral vascular catheterization Right 05/18/2015    Procedure: Renal Angiography;  Surgeon: Algernon Huxley, MD;  Location: [facility];  Service: Cardiovascular;  Laterality: Right;  . Peripheral vascular catheterization Bilateral 05/18/2015    Procedure: Renal Intervention;  Surgeon: Algernon Huxley, MD;  Location: [facility];  Service: Cardiovascular;  Laterality: Bilateral;     (Not in a hospital admission) No Known Allergies  Social History  Substance Use Topics  . Smoking status: Never Smoker   . Smokeless tobacco: Never Used  . Alcohol Use: Yes     Comment: occasional    Family History  Problem Relation Age of Onset  . Nephrolithiasis Mother   . Cancer Father      Objective:  Patient Vitals for the past 8 hrs:  BP Temp Temp src Pulse Resp SpO2  05/30/15 2045 153/93 mmHg - - 82 18 94 %  05/30/15 2030 150/85 mmHg - - 78 18 96 %  05/30/15 2015 154/87 mmHg - - 74 25 91 %  05/30/15 2000 152/86 mmHg - - 77 21 98 %  05/30/15 1945 151/85 mmHg - - 79 20 96 %  05/30/15 1930 151/83 mmHg - - 79 18 92 %  05/30/15 1900 150/86 mmHg - - 73 18 92 %  05/30/15 1845 153/84 mmHg - - 74 25 93 %  05/30/15 1830 142/84 mmHg - - 71 - 92 %  05/30/15 1815 156/88 mmHg - - - - -  05/30/15 1654 156/85 mmHg - - - - -  05/30/15 1653 157/84 mmHg 98.4 F (36.9 C) Oral 70 17 97 %   General appearance: alert, cooperative, appears stated age and no distress Head: Normocephalic, without obvious abnormality, atraumatic Eyes: conjunctivae/corneas clear. PERRL, EOM's intact. Fundi benign. Neck: no carotid bruit, no JVD and supple, symmetrical, trachea midline Lungs: clear to auscultation bilaterally Chest wall: no tenderness Heart: regular  rate and rhythm, S1, S2 normal, no murmur, click, rub or gallop Abdomen: soft, non-tender; bowel sounds normal; no masses,  no organomegaly Extremities: extremities normal, atraumatic, no cyanosis or edema Pulses: 2+ and symmetric Neurologic: Grossly normal  Results for orders placed or performed during the hospital encounter of 05/30/15 (from the past 48 hour(s))  Basic metabolic panel     Status: Abnormal   Collection Time: 05/30/15  4:59 PM  Result Value Ref Range   Sodium 138 135 - 145 mmol/L   Potassium 3.7 3.5 - 5.1 mmol/L   Chloride 98 (L) 101 - 111 mmol/L   CO2 31 22 - 32 mmol/L   Glucose, Bld 113 (H) 65 - 99 mg/dL   BUN 13 6 - 20 mg/dL   Creatinine, Ser 1.45 (H) 0.61 - 1.24 mg/dL   Calcium 9.8 8.9 - 10.3 mg/dL   GFR calc non Af Amer 56 (L) >60 mL/min   GFR calc Af Amer >60 >60 mL/min    Comment: (NOTE) The eGFR has been calculated using the CKD EPI equation. This calculation has not been validated in all clinical situations. eGFR's persistently <60 mL/min signify possible Chronic Kidney Disease.    Anion gap 9 5 - 15  CBC     Status: Abnormal   Collection Time: 05/30/15  4:59 PM  Result Value Ref Range   WBC 10.9 (H) 4.0 - 10.5 K/uL   RBC 4.80 4.22 - 5.81 MIL/uL   Hemoglobin 15.3 13.0 - 17.0 g/dL   HCT 43.7 39.0 - 52.0 %   MCV 91.0 78.0 - 100.0 fL   MCH 31.9 26.0 - 34.0 pg   MCHC 35.0 30.0 - 36.0 g/dL   RDW 11.3 (L) 11.5 - 15.5 %   Platelets 288 150 - 400 K/uL  I-stat troponin, ED     Status: None   Collection Time: 05/30/15  5:10 PM  Result Value Ref Range   Troponin i, poc 0.00 0.00 - 0.08 ng/mL   Comment 3            Comment: Due to the release kinetics of cTnI, a negative result within the first hours of the onset of symptoms does not rule out myocardial infarction with certainty. If myocardial infarction is still suspected, repeat the test at appropriate intervals.   I-Stat Chem 8, ED     Status: Abnormal   Collection Time: 05/30/15  5:50 PM  Result  Value Ref Range   Sodium 138 135 - 145 mmol/L   Potassium 3.5 3.5 - 5.1 mmol/L   Chloride 96 (L) 101 - 111 mmol/L   BUN 16 6 - 20 mg/dL   Creatinine, Ser 1.50 (H) 0.61 - 1.24 mg/dL   Glucose, Bld 102 (H) 65 - 99 mg/dL   Calcium, Ion 1.21 1.12 - 1.23 mmol/L   TCO2 30 0 - 100 mmol/L   Hemoglobin 18.7 (H) 13.0 -  17.0 g/dL   HCT 55.0 (H) 39.0 - 52.0 %   Dg Chest 2 View  05/30/2015   CLINICAL DATA:  Left side chest pain for 3 days  EXAM: CHEST  2 VIEW  COMPARISON:  05/17/2015  FINDINGS: Cardiomediastinal silhouette is stable. No acute infiltrate or pleural effusion. No pulmonary edema. Bony thorax is unremarkable.  IMPRESSION: No active cardiopulmonary disease.   Electronically Signed   By: Lahoma Crocker M.D.   On: 05/30/2015 18:33   Ct Angio Chest Aorta W/cm &/or Wo/cm  05/30/2015   CLINICAL DATA:  Mid chest pain onset 12 p.m. Recent admission for renal infarct. Clinical concern for aortic dissection.  EXAM: CT ANGIOGRAPHY CHEST, ABDOMEN AND PELVIS  TECHNIQUE: Multidetector CT imaging through the chest, abdomen and pelvis was performed using the standard protocol during bolus administration of intravenous contrast. Multiplanar reconstructed images and MIPs were obtained and reviewed to evaluate the vascular anatomy.  CONTRAST:  142m OMNIPAQUE IOHEXOL 350 MG/ML SOLN  COMPARISON:  05/26/2015; 05/17/2015  FINDINGS: CTA CHEST FINDINGS  Mediastinum/Nodes: Noncontrast images demonstrate no evidence of acute aortic intramural hematoma. Calcifications compatible with old granulomatous disease in the left hilar and infrahilar region, with small left lower lobe granulomas observed.  Systemic arterial phase images demonstrate no evidence of aortic dissection chest. Hypodensity within or along the right upper lobe pulmonary artery on image 36 of series 5 is attributed to streak artifact from the dense contrast medium in the SVC. No other pulmonary arterial hypodensity is identified.  No pathologic thoracic adenopathy.   There is mild cardiomegaly.  Lungs/Pleura: Calcified granuloma near the minor fissure, image 49 series 6, in the lower part of the right upper lobe.  Bilateral dependent subsegmental atelectasis.  Musculoskeletal: Thoracic spondylosis.  Review of the MIP images confirms the above findings.  CTA ABDOMEN AND PELVIS FINDINGS  Hepatobiliary: Unremarkable  Pancreas: Unremarkable  Spleen: Unremarkable  Adrenals/Urinary Tract: Geographic infarct of the right kidney primarily involving the upper pole, distribution node not changed from prior.  Stomach/Bowel: Descending and sigmoid colon diverticulosis. There is some mild wall thickening throughout much of the colon, query low grade colitis.  Vascular/Lymphatic: There is abnormal luminal hypodensity in the left renal artery severely restricting the caliber of the rib right renal artery as shown on images 55 through 70 of series 8, with patency of a branch extending to the mid kidney and lower pole but thrombosis of the branch extending to the involved segment of the upper pole. This is a fairly similar appearance to the prior exam. 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 The celiac trunk, superior mesenteric artery, inferior mesenteric artery, and left renal artery appear unremarkable. There is very little in the way of atherosclerotic calcification. Pelvic vasculature unremarkable.  Reproductive: Unremarkable  Other: Trace free pelvic fluid, significance uncertain.  Musculoskeletal: Lumbar spondylosis and degenerative disc disease causing mild right foraminal impingement at L4-5 and mild left foraminal impingement at L4-5 and L5-S1. Disc bulge and spurring appear to cause mild central narrowing of the thecal sac at the L1-2 and L2-3 levels. Multilevel Schmorl's nodes.  Review of the MIP images confirms the above findings.  IMPRESSION: 1. Similar distribution of renal infarct in the  right kidney upper pole in right mid kidney, with irregular luminal narrowing and suspected dissection in the right renal artery, including the right kidney upper pole branch, but narrowing the main renal artery proximal  to the lower pole branch. This is the only dissection observed; no aortic dissection or other vascular dissection in the chest, abdomen, or pelvis is seen. This could warrant intervention to prevent further occlusion of the right renal artery, correlate with prior disposition from 05/26/2015 with regard to vascular surgery or interventional radiology assessment. 2. Old granulomatous disease. 3. Descending and sigmoid colon diverticulosis. Possible low grade colitis given the slight diffuse colon wall thickening. 4. Lumbar spondylosis and degenerative disc disease causing mild multilevel impingement.   Electronically Signed   By: Van Clines M.D.   On: 05/30/2015 19:48   Ct Cta Abd/pel W/cm &/or W/o Cm  05/30/2015   CLINICAL DATA:  Mid chest pain onset 12 p.m. Recent admission for renal infarct. Clinical concern for aortic dissection.  EXAM: CT ANGIOGRAPHY CHEST, ABDOMEN AND PELVIS  TECHNIQUE: Multidetector CT imaging through the chest, abdomen and pelvis was performed using the standard protocol during bolus administration of intravenous contrast. Multiplanar reconstructed images and MIPs were obtained and reviewed to evaluate the vascular anatomy.  CONTRAST:  119m OMNIPAQUE IOHEXOL 350 MG/ML SOLN  COMPARISON:  05/26/2015; 05/17/2015  FINDINGS: CTA CHEST FINDINGS  Mediastinum/Nodes: Noncontrast images demonstrate no evidence of acute aortic intramural hematoma. Calcifications compatible with old granulomatous disease in the left hilar and infrahilar region, with small left lower lobe granulomas observed.  Systemic arterial phase images demonstrate no evidence of aortic dissection chest. Hypodensity within or along the right upper lobe pulmonary artery on image 36 of series 5 is attributed  to streak artifact from the dense contrast medium in the SVC. No other pulmonary arterial hypodensity is identified.  No pathologic thoracic adenopathy.  There is mild cardiomegaly.  Lungs/Pleura: Calcified granuloma near the minor fissure, image 49 series 6, in the lower part of the right upper lobe.  Bilateral dependent subsegmental atelectasis.  Musculoskeletal: Thoracic spondylosis.  Review of the MIP images confirms the above findings.  CTA ABDOMEN AND PELVIS FINDINGS  Hepatobiliary: Unremarkable  Pancreas: Unremarkable  Spleen: Unremarkable  Adrenals/Urinary Tract: Geographic infarct of the right kidney primarily involving the upper pole, distribution node not changed from prior.  Stomach/Bowel: Descending and sigmoid colon diverticulosis. There is some mild wall thickening throughout much of the colon, query low grade colitis.  Vascular/Lymphatic: There is abnormal luminal hypodensity in the left renal artery severely restricting the caliber of the rib right renal artery as shown on images 55 through 70 of series 8, with patency of a branch extending to the mid kidney and lower pole but thrombosis of the branch extending to the involved segment of the upper pole. This is a fairly similar appearance to the prior exam. 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 The celiac trunk, superior mesenteric artery, inferior mesenteric artery, and left renal artery appear unremarkable. There is very little in the way of atherosclerotic calcification. Pelvic vasculature unremarkable.  Reproductive: Unremarkable  Other: Trace free pelvic fluid, significance uncertain.  Musculoskeletal: Lumbar spondylosis and degenerative disc disease causing mild right foraminal impingement at L4-5 and mild left foraminal impingement at L4-5 and L5-S1. Disc bulge and spurring appear to cause mild central narrowing of the thecal sac at the L1-2 and L2-3  levels. Multilevel Schmorl's nodes.  Review of the MIP images confirms the above findings.  IMPRESSION: 1. Similar distribution of renal infarct in the right kidney upper pole in right mid kidney, with irregular luminal narrowing and suspected  dissection in the right renal artery, including the right kidney upper pole branch, but narrowing the main renal artery proximal to the lower pole branch. This is the only dissection observed; no aortic dissection or other vascular dissection in the chest, abdomen, or pelvis is seen. This could warrant intervention to prevent further occlusion of the right renal artery, correlate with prior disposition from 05/26/2015 with regard to vascular surgery or interventional radiology assessment. 2. Old granulomatous disease. 3. Descending and sigmoid colon diverticulosis. Possible low grade colitis given the slight diffuse colon wall thickening. 4. Lumbar spondylosis and degenerative disc disease causing mild multilevel impingement.   Electronically Signed   By: Van Clines M.D.   On: 05/30/2015 19:48    ECG:  HR 68, normal sinus rhythm, no acute ischemic changes, similar to most recent ecgs.  Echo 05/16/15:   No cardiac source of emboli was indentified.  Normal overall left ventricular function Normal wall motion Normal valvular structures Ejection fraction around 55% No evidence of thrombus.  Aortogram/Renal Arteries 05/18/15:small branch artery occlusion of the main right renal artery branch with no flow limiting lesion seen in the remaining right renal vasculature. No FMD. No aortic disease.  Impression: 45M with no hx of prior MI or known CAD or CHF with recent diagnosis of right renal infarction upper and middle pole with right renal artery dissection who now presents with severe chest pain.  Initial evaluation with labs, EKG, and CTA reveals no acute abnormalities.  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 -Continue home BB -Cycle cardiac biomarkers, check lipids, TSH -Reassess in am for further chest pain episodes, may consider non-invasive testing.  Thank you for this consult.  Please call with questions.

## 2015-05-30 NOTE — ED Notes (Signed)
Charge RN notified.  

## 2015-05-30 NOTE — ED Notes (Signed)
Pt pulled leads and bp cuff off arm. Family states "we're leaving." States "we waited over an hour for chest pain. We should have gone to [place]." Pt pulling at IV, RN offered to remove IV. Family states "you can tell them we're leaving AMA." RN asked pt to sign AMA form, family refused to allow pt to sign. RN attempted to retreive a wheelchair for patient. Again family refused wheelchair. Pt a/ox4, ambulatory, complaining of "chest pain." Will not rate pain for RN. Will not allow RN to call MD or give medications.

## 2015-05-30 NOTE — ED Notes (Signed)
Admitting MD at bedside.

## 2015-05-30 NOTE — ED Provider Notes (Signed)
CSN: [id number]     Arrival date & time 05/30/15  1639 History   First MD Initiated Contact with Patient 05/30/15 1704     Chief Complaint  Patient presents with  . Chest Pain     (Consider location/radiation/quality/duration/timing/severity/associated sxs/prior Treatment) Patient is a 49 y.o. male presenting with chest pain.  Chest Pain Pain location:  Substernal area Pain quality: pressure   Pain radiates to:  L shoulder Pain radiates to the back: no   Pain severity:  Severe Onset quality:  Gradual Duration:  5 hours Timing:  Constant Progression:  Worsening Chronicity:  New Context: breathing   Relieved by:  None tried Worsened by:  Deep breathing Ineffective treatments:  None tried Associated symptoms: abdominal pain, diaphoresis and nausea   Associated symptoms: no cough, no fever, no headache, no shortness of breath and not vomiting   Risk factors: immobilization   Risk factors: no high cholesterol and no hypertension     Past Medical History  Diagnosis Date  . Reflux   . GERD (gastroesophageal reflux disease)    Past Surgical History  Procedure Laterality Date  . Back surgery    . Peripheral vascular catheterization Right 05/18/2015    Procedure: Renal Angiography;  Surgeon: Algernon Huxley, MD;  Location: [place] [facility];  Service: Cardiovascular;  Laterality: Right;  . Peripheral vascular catheterization Bilateral 05/18/2015    Procedure: Renal Intervention;  Surgeon: Algernon Huxley, MD;  Location: [place] [facility];  Service: Cardiovascular;  Laterality: Bilateral;   Family History  Problem Relation Age of Onset  . Nephrolithiasis Mother   . Cancer Father    Social History  Substance Use Topics  . Smoking status: Never Smoker   . Smokeless tobacco: Never Used  . Alcohol Use: Yes     Comment: occasional    Review of Systems  Constitutional: Positive for diaphoresis. Negative for fever and chills.  HENT: Negative for congestion and sore throat.    Eyes: Negative for visual disturbance.  Respiratory: Negative for cough, shortness of breath and wheezing.   Cardiovascular: Positive for chest pain.  Gastrointestinal: Positive for nausea and abdominal pain. Negative for vomiting, diarrhea and constipation.  Genitourinary: Negative for dysuria and difficulty urinating.  Musculoskeletal: Negative for myalgias and arthralgias.  Skin: Negative for wound.  Neurological: Negative for syncope and headaches.  Psychiatric/Behavioral: Negative for behavioral problems.  All other systems reviewed and are negative.     Allergies  Review of patient's allergies indicates no known allergies.  Home Medications   Prior to Admission medications   Medication Sig Start Date End Date Taking? Authorizing Provider  apixaban (ELIQUIS) 5 MG TABS tablet Take 1 tablet (5 mg total) by mouth 2 (two) times daily. 05/18/15  Yes Idelle Crouch, MD  HYDROcodone-acetaminophen [facility]) 10-325 MG per tablet Take 1 tablet by mouth every 4 (four) hours as needed for moderate pain. 05/18/15  Yes Idelle Crouch, MD  ibuprofen (ADVIL,MOTRIN) 200 MG tablet Take 400-600 mg by mouth every 6 (six) hours as needed for headache or mild pain.   Yes Historical Provider, MD  metoprolol tartrate (LOPRESSOR) 25 MG tablet Take 0.5 tablets (12.5 mg total) by mouth 2 (two) times daily. 05/28/15  Yes Katha Cabal, MD  omeprazole (PRILOSEC) 20 MG capsule Take 1 capsule by mouth daily. 09/27/14  Yes Historical Provider, MD  ondansetron (ZOFRAN) 4 MG tablet Take 4 mg by mouth every 8 (eight) hours as needed for nausea or vomiting.   Yes  Historical Provider, MD   BP 156/85 mmHg  Pulse 70  Temp(Src) 98.4 F (36.9 C) (Oral)  Resp 17  SpO2 97% Physical Exam  Constitutional: He is oriented to person, place, and time. He appears well-developed and well-nourished. He appears distressed.  HENT:  Head: Normocephalic and atraumatic.  Eyes: EOM are normal.  Neck: Normal range of motion.   Cardiovascular: Normal rate, regular rhythm, normal heart sounds and intact distal pulses.   No murmur heard. Pulmonary/Chest: Effort normal and breath sounds normal. No respiratory distress. He has no wheezes. He has no rales. He exhibits no tenderness.  Abdominal: Soft. There is tenderness.  Musculoskeletal: He exhibits no edema.  Neurological: He is alert and oriented to person, place, and time.  Skin: No rash noted. He is not diaphoretic.    ED Course  Procedures (including critical care time) Labs Review Labs Reviewed  BASIC METABOLIC PANEL - Abnormal; Notable for the following:    Chloride 98 (*)    Glucose, Bld 113 (*)    Creatinine, Ser 1.45 (*)    GFR calc non Af Amer 56 (*)    All other components within normal limits  CBC - Abnormal; Notable for the following:    WBC 10.9 (*)    RDW 11.3 (*)    All other components within normal limits  I-STAT CHEM 8, ED - Abnormal; Notable for the following:    Chloride 96 (*)    Creatinine, Ser 1.50 (*)    Glucose, Bld 102 (*)    Hemoglobin 18.7 (*)    HCT 55.0 (*)    All other components within normal limits  LIPASE, BLOOD  I-STAT TROPOININ, ED    Imaging Review Dg Chest 2 View  05/30/2015   CLINICAL DATA:  Left side chest pain for 3 days  EXAM: CHEST  2 VIEW  COMPARISON:  05/17/2015  FINDINGS: Cardiomediastinal silhouette is stable. No acute infiltrate or pleural effusion. No pulmonary edema. Bony thorax is unremarkable.  IMPRESSION: No active cardiopulmonary disease.   Electronically Signed   By: Lahoma Crocker M.D.   On: 05/30/2015 18:33   Ct Angio Chest Aorta W/cm &/or Wo/cm  05/30/2015   CLINICAL DATA:  Mid chest pain onset 12 p.m. Recent admission for renal infarct. Clinical concern for aortic dissection.  EXAM: CT ANGIOGRAPHY CHEST, ABDOMEN AND PELVIS  TECHNIQUE: Multidetector CT imaging through the chest, abdomen and pelvis was performed using the standard protocol during bolus administration of intravenous contrast.  Multiplanar reconstructed images and MIPs were obtained and reviewed to evaluate the vascular anatomy.  CONTRAST:  139mL OMNIPAQUE IOHEXOL 350 MG/ML SOLN  COMPARISON:  05/26/2015; 05/17/2015  FINDINGS: CTA CHEST FINDINGS  Mediastinum/Nodes: Noncontrast images demonstrate no evidence of acute aortic intramural hematoma. Calcifications compatible with old granulomatous disease in the left hilar and infrahilar region, with small left lower lobe granulomas observed.  Systemic arterial phase images demonstrate no evidence of aortic dissection chest. Hypodensity within or along the right upper lobe pulmonary artery on image 36 of series 5 is attributed to streak artifact from the dense contrast medium in the SVC. No other pulmonary arterial hypodensity is identified.  No pathologic thoracic adenopathy.  There is mild cardiomegaly.  Lungs/Pleura: Calcified granuloma near the minor fissure, image 49 series 6, in the lower part of the right upper lobe.  Bilateral dependent subsegmental atelectasis.  Musculoskeletal: Thoracic spondylosis.  Review of the MIP images confirms the above findings.  CTA ABDOMEN AND PELVIS FINDINGS  Hepatobiliary: Unremarkable  Pancreas:  Unremarkable  Spleen: Unremarkable  Adrenals/Urinary Tract: Geographic infarct of the right kidney primarily involving the upper pole, distribution node not changed from prior.  Stomach/Bowel: Descending and sigmoid colon diverticulosis. There is some mild wall thickening throughout much of the colon, query low grade colitis.  Vascular/Lymphatic: There is abnormal luminal hypodensity in the left renal artery severely restricting the caliber of the rib right renal artery as shown on images 55 through 70 of series 8, with patency of a branch extending to the mid kidney and lower pole but thrombosis of the branch extending to the involved segment of the upper pole. This is a fairly similar appearance to the prior exam. 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 The celiac trunk, superior mesenteric artery, inferior mesenteric artery, and left renal artery appear unremarkable. There is very little in the way of atherosclerotic calcification. Pelvic vasculature unremarkable.  Reproductive: Unremarkable  Other: Trace free pelvic fluid, significance uncertain.  Musculoskeletal: Lumbar spondylosis and degenerative disc disease causing mild right foraminal impingement at L4-5 and mild left foraminal impingement at L4-5 and L5-S1. Disc bulge and spurring appear to cause mild central narrowing of the thecal sac at the L1-2 and L2-3 levels. Multilevel Schmorl's nodes.  Review of the MIP images confirms the above findings.  IMPRESSION: 1. 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 This is the only dissection observed; no aortic dissection or other vascular dissection in the chest, abdomen, or pelvis is seen. This could warrant intervention to prevent further occlusion of the right renal artery, correlate with prior disposition from 05/26/2015 with regard to vascular surgery or interventional radiology assessment. 2. Old granulomatous disease. 3. Descending and sigmoid colon diverticulosis. Possible low grade colitis given the slight diffuse colon wall thickening. 4. Lumbar spondylosis and degenerative disc disease causing mild multilevel impingement.   Electronically Signed   By: Van Clines M.D.   On: 05/30/2015 19:48   Ct Cta Abd/pel W/cm &/or W/o Cm  05/30/2015   CLINICAL DATA:  Mid chest pain onset 12 p.m. Recent admission for renal infarct. Clinical concern for aortic dissection.  EXAM: CT ANGIOGRAPHY CHEST, ABDOMEN AND PELVIS  TECHNIQUE:  Multidetector CT imaging through the chest, abdomen and pelvis was performed using the standard protocol during bolus administration of intravenous contrast. Multiplanar reconstructed images and MIPs were obtained and reviewed to evaluate the vascular anatomy.  CONTRAST:  120mL OMNIPAQUE IOHEXOL 350 MG/ML SOLN  COMPARISON:  05/26/2015; 05/17/2015  FINDINGS: CTA CHEST FINDINGS  Mediastinum/Nodes: Noncontrast images demonstrate no evidence of acute aortic intramural hematoma. Calcifications compatible with old granulomatous disease in the left hilar and infrahilar region, with small left lower lobe granulomas observed.  Systemic arterial phase images demonstrate no evidence of aortic dissection chest. Hypodensity within or along the right upper lobe pulmonary artery on image 36 of series 5 is attributed to streak artifact from the dense contrast medium in the SVC. No other pulmonary arterial hypodensity is identified.  No pathologic thoracic adenopathy.  There is mild cardiomegaly.  Lungs/Pleura: Calcified granuloma near the minor fissure, image 49 series 6, in the lower part of the right upper lobe.  Bilateral dependent subsegmental atelectasis.  Musculoskeletal: Thoracic spondylosis.  Review of the MIP images confirms the above findings.  CTA ABDOMEN AND PELVIS FINDINGS  Hepatobiliary: Unremarkable  Pancreas: Unremarkable  Spleen: Unremarkable  Adrenals/Urinary Tract: Geographic infarct of the right kidney primarily involving the upper pole, distribution node not changed from prior.  Stomach/Bowel: Descending and sigmoid colon diverticulosis. There is some mild wall thickening throughout much of the colon, query low grade colitis.  Vascular/Lymphatic: There is abnormal luminal hypodensity in the left renal artery severely restricting the caliber of the rib right renal artery as shown on images 55 through 70 of series 8, with patency of a branch extending to the mid kidney and lower pole but thrombosis of the branch  extending to the involved segment of the upper pole. This is a fairly similar appearance to the prior exam. 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 The celiac trunk, superior mesenteric artery, inferior mesenteric artery, and left renal artery appear unremarkable. There is very little in the way of atherosclerotic calcification. Pelvic vasculature unremarkable.  Reproductive: Unremarkable  Other: Trace free pelvic fluid, significance uncertain.  Musculoskeletal: Lumbar spondylosis and degenerative disc disease causing mild right foraminal impingement at L4-5 and mild left foraminal impingement at L4-5 and L5-S1. Disc bulge and spurring appear to cause mild central narrowing of the thecal sac at the L1-2 and L2-3 levels. Multilevel Schmorl's nodes.  Review of the MIP images confirms the above findings.  IMPRESSION: 1. 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 This is the only dissection observed; no aortic dissection or other vascular dissection in the chest, abdomen, or pelvis is seen. This could warrant intervention to prevent further occlusion of the right renal artery, correlate with prior disposition from 05/26/2015 with regard to vascular surgery or interventional radiology assessment. 2. Old granulomatous disease. 3. Descending and sigmoid colon diverticulosis. Possible low grade colitis given the slight diffuse colon wall thickening. 4. Lumbar spondylosis and degenerative disc disease causing mild multilevel impingement.   Electronically Signed   By: Van Clines M.D.   On: 05/30/2015 19:48   I have personally reviewed and evaluated these images and lab results as  part of my medical decision-making.   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 Patient was discharged 4 days ago. Patient is currently taking eliquis. Patient states today he began having significant substernal chest tightness radiates into his left shoulder. Patient spoke with his vascular surgeon and they said to come in for evaluation for possible dissection. On arrival to the ED the patient is in distress diaphoretic and tearful. Patient has normal vital signs however he is beta blocked and a not feel her present tachycardia. Patient had normal pulses in all 4 extremities. Given concern of possible aortic dissection I spoke with vascular surgery who stated to get the dissection study despite him having borderline renal dysfunction. A CT of his aorta did not show any evidence of a dissection. Patient had second pain relief after multiple rounds of Dilaudid. She was given 2 IV labetalol pushes for blood  pressure however blood pressure remained elevated. We will admit the patient to medicine for further evaluation. Patient's initial troponin is negative. I spoke with cardiology as well for their input and they will see the patient.    Renne Musca, MD 05/30/15 [id number]  Charlesetta Shanks, MD 06/01/15 1520

## 2015-05-31 ENCOUNTER — Observation Stay
Admission: EM | Admit: 2015-05-31 | Discharge: 2015-06-01 | Disposition: A | Discharge status: Home / Self Care | Payer: 59 | Attending: Internal Medicine | Admitting: Internal Medicine

## 2015-05-31 ENCOUNTER — Encounter: Payer: Self-pay | Admitting: Internal Medicine

## 2015-05-31 ENCOUNTER — Observation Stay
Admit: 2015-05-31 | Discharge: 2015-05-31 | Disposition: A | Discharge status: Home / Self Care | Payer: 59 | Attending: Internal Medicine | Admitting: Internal Medicine

## 2015-05-31 DIAGNOSIS — R079 Chest pain, unspecified: Secondary | ICD-10-CM | POA: Diagnosis present

## 2015-05-31 LAB — CBC
HEMATOCRIT: 40.8 % (ref 40.0–52.0)
HEMOGLOBIN: 14 g/dL (ref 13.0–18.0)
MCH: 31.5 pg (ref 26.0–34.0)
MCHC: 34.2 g/dL (ref 32.0–36.0)
MCV: 91.9 fL (ref 80.0–100.0)
Platelets: 244 10*3/uL (ref 150–440)
RBC: 4.43 MIL/uL (ref 4.40–5.90)
RDW: 11.7 % (ref 11.5–14.5)
WBC: 11.8 10*3/uL — ABNORMAL HIGH (ref 3.8–10.6)

## 2015-05-31 LAB — BASIC METABOLIC PANEL
ANION GAP: 9 (ref 5–15)
BUN: 15 mg/dL (ref 6–20)
CO2: 31 mmol/L (ref 22–32)
Calcium: 9.2 mg/dL (ref 8.9–10.3)
Chloride: 96 mmol/L — ABNORMAL LOW (ref 101–111)
Creatinine, Ser: 1.5 mg/dL — ABNORMAL HIGH (ref 0.61–1.24)
GFR calc Af Amer: >60 mL/min (ref >60)
GFR calc non Af Amer: 54 mL/min — ABNORMAL LOW (ref >60)
GLUCOSE: 138 mg/dL — AB (ref 65–99)
POTASSIUM: 4.1 mmol/L (ref 3.5–5.1)
Sodium: 136 mmol/L (ref 135–145)

## 2015-05-31 LAB — TROPONIN I
Troponin I: <0.03 ng/mL (ref <0.031)
Troponin I: <0.03 ng/mL (ref <0.031)
Troponin I: <0.03 ng/mL (ref <0.031)

## 2015-05-31 MED ORDER — MORPHINE SULFATE (PF) 4 MG/ML IV SOLN
4.0000 mg | Freq: Once | INTRAVENOUS | Status: AC
  Administered 2015-05-31: 4 mg via INTRAVENOUS
  Filled 2015-05-31: qty 1

## 2015-05-31 MED ORDER — NAPROXEN 500 MG PO TABS
500.0000 mg | ORAL_TABLET | Freq: Two times a day (BID) | ORAL | Status: DC
  Administered 2015-05-31 – 2015-06-01 (×2): 500 mg via ORAL
  Filled 2015-05-31 (×2): qty 1

## 2015-05-31 MED ORDER — ONDANSETRON HCL 4 MG/2ML IJ SOLN
4.0000 mg | Freq: Four times a day (QID) | INTRAMUSCULAR | Status: DC | PRN
  Administered 2015-05-31: 4 mg via INTRAVENOUS
  Filled 2015-05-31: qty 2

## 2015-05-31 MED ORDER — METOPROLOL TARTRATE 25 MG PO TABS
25.0000 mg | ORAL_TABLET | Freq: Two times a day (BID) | ORAL | Status: DC
  Administered 2015-05-31 – 2015-06-01 (×3): 25 mg via ORAL
  Filled 2015-05-31 (×3): qty 1

## 2015-05-31 MED ORDER — ACETAMINOPHEN 325 MG PO TABS: 650.0000 mg | ORAL_TABLET | Freq: Four times a day (QID) | ORAL | Status: DC | PRN

## 2015-05-31 MED ORDER — PANTOPRAZOLE SODIUM 40 MG PO TBEC
40.0000 mg | DELAYED_RELEASE_TABLET | Freq: Every day | ORAL | Status: DC
  Administered 2015-05-31 – 2015-06-01 (×2): 40 mg via ORAL
  Filled 2015-05-31 (×2): qty 1

## 2015-05-31 MED ORDER — ACETAMINOPHEN 650 MG RE SUPP: 650.0000 mg | Freq: Four times a day (QID) | RECTAL | Status: DC | PRN

## 2015-05-31 MED ORDER — METOPROLOL TARTRATE 1 MG/ML IV SOLN
5.0000 mg | Freq: Once | INTRAVENOUS | Status: AC
  Administered 2015-05-31: 5 mg via INTRAVENOUS
  Filled 2015-05-31: qty 5

## 2015-05-31 MED ORDER — ONDANSETRON HCL 4 MG PO TABS: 4.0000 mg | ORAL_TABLET | Freq: Four times a day (QID) | ORAL | Status: DC | PRN

## 2015-05-31 MED ORDER — ATORVASTATIN CALCIUM 20 MG PO TABS
20.0000 mg | ORAL_TABLET | Freq: Every day | ORAL | Status: DC
  Administered 2015-05-31: 20 mg via ORAL
  Filled 2015-05-31: qty 1

## 2015-05-31 MED ORDER — NITROGLYCERIN 0.4 MG SL SUBL: 0.4000 mg | SUBLINGUAL_TABLET | SUBLINGUAL | Status: DC | PRN

## 2015-05-31 MED ORDER — APIXABAN 5 MG PO TABS
5.0000 mg | ORAL_TABLET | Freq: Two times a day (BID) | ORAL | Status: DC
Start: 2015-05-31 — End: 2015-06-01
  Administered 2015-05-31 – 2015-06-01 (×4): 5 mg via ORAL
  Filled 2015-05-31 (×4): qty 1

## 2015-05-31 MED ORDER — ASPIRIN EC 81 MG PO TBEC
81.0000 mg | DELAYED_RELEASE_TABLET | Freq: Every day | ORAL | Status: DC
  Administered 2015-05-31 – 2015-06-01 (×2): 81 mg via ORAL
  Filled 2015-05-31 (×2): qty 1

## 2015-05-31 MED ORDER — SODIUM CHLORIDE 0.9 % IJ SOLN
3.0000 mL | Freq: Two times a day (BID) | INTRAMUSCULAR | Status: DC
  Administered 2015-05-31 (×3): 3 mL via INTRAVENOUS

## 2015-05-31 MED ORDER — OXYCODONE HCL 5 MG PO TABS
10.0000 mg | ORAL_TABLET | ORAL | Status: DC | PRN
  Administered 2015-05-31: 10 mg via ORAL
  Filled 2015-05-31: qty 2

## 2015-05-31 MED ORDER — OXYCODONE HCL 5 MG PO TABS: 5.0000 mg | ORAL_TABLET | ORAL | Status: DC | PRN

## 2015-05-31 MED ORDER — MORPHINE SULFATE (PF) 2 MG/ML IV SOLN
2.0000 mg | INTRAVENOUS | Status: DC | PRN
  Administered 2015-05-31 (×2): 2 mg via INTRAVENOUS
  Filled 2015-05-31 (×2): qty 1

## 2015-05-31 MED ORDER — METOPROLOL TARTRATE 25 MG PO TABS
12.5000 mg | ORAL_TABLET | Freq: Two times a day (BID) | ORAL | Status: DC
  Administered 2015-05-31: 12.5 mg via ORAL
  Filled 2015-05-31: qty 1

## 2015-05-31 MED ORDER — MEPERIDINE HCL 50 MG PO TABS: 50.0000 mg | ORAL_TABLET | Freq: Once | ORAL | Status: DC

## 2015-05-31 NOTE — Progress Notes (Signed)
Spoke with dr. Doy Hutching to make aware family and patient would like morphine dose to be changed from every 4hr to every 2. Per md he does not want to change frequency however order demerol 50mg  po x1. Will continue to monitor [employer]

## 2015-05-31 NOTE — ED Provider Notes (Signed)
[facility] Emergency Department Provider Note  ____________________________________________  Time seen: 12:20 AM  I have reviewed the triage vital signs and the nursing notes.   HISTORY  Chief Complaint Chest Pain      HPI Johnny Robinson is a 49 y.o. male presents with central chest pain described as pressure with acute onset at 12:00 PM yesterday afternoon. Patient states that the pain is worse with inspiration. Current pain score 7 out of 10. Of note patient was seen at [facility] this evening for the same however left before completion of evaluation because he was more comfortable being seen at [facility]. 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     Past Medical History  Diagnosis Date  . Reflux   . GERD (gastroesophageal reflux disease)     Patient Active Problem List   Diagnosis Date Noted  . Chest pain 05/30/2015  . 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 Back surgery    . Peripheral vascular catheterization Right 05/18/2015    Procedure: Renal Angiography;  Surgeon: Algernon Huxley, MD;  Location: [place] [facility];  Service: Cardiovascular;  Laterality: Right;  . Peripheral vascular catheterization Bilateral 05/18/2015    Procedure: Renal Intervention;  Surgeon: Algernon Huxley, MD;  Location: [place] [facility];  Service: Cardiovascular;  Laterality: Bilateral;    Current Outpatient Rx  Name  Route  Sig  Dispense  Refill  . apixaban (ELIQUIS) 5 MG TABS tablet   Oral   Take 1 tablet (5 mg total) by mouth 2 (two) times daily.   60 tablet   5   . HYDROcodone-acetaminophen (NORCO) 10-325 MG per tablet   Oral   Take 1 tablet by mouth every 4 (four) hours as needed for moderate  pain.   30 tablet   0   . ibuprofen (ADVIL,MOTRIN) 200 MG tablet   Oral   Take 400-600 mg by mouth every 6 (six) hours as needed for headache or mild pain.         . metoprolol tartrate (LOPRESSOR) 25 MG tablet   Oral   Take 0.5 tablets (12.5 mg total) by mouth 2 (two) times daily.   60 tablet   5   . omeprazole (PRILOSEC) 20 MG capsule   Oral   Take 1 capsule by mouth daily.         . ondansetron (ZOFRAN) 4 MG tablet   Oral   Take 4 mg by mouth every 8 (eight) hours as needed for nausea or vomiting.           Allergies Review of patient's allergies indicates no known allergies.  Family History  Problem Relation Age of Onset  . Nephrolithiasis Mother   . Cancer Father     Social History Social History  Substance Use Topics  . Smoking status: Never Smoker   . Smokeless tobacco: Never Used  . Alcohol Use: Yes     Comment: occasional    Review of Systems  Constitutional: Negative for fever. Eyes: Negative for visual changes. ENT: Negative for sore throat. Cardiovascular: positive for chest pain. Respiratory: Negative for shortness of breath. Gastrointestinal: Negative for abdominal pain, vomiting and diarrhea. Genitourinary: Negative for dysuria. Musculoskeletal: Negative for back pain.  Skin: Negative for rash. Neurological: Negative for headaches, focal weakness or numbness.   10-point ROS otherwise negative.  ____________________________________________   PHYSICAL EXAM:  VITAL SIGNS: ED Triage Vitals  Enc Vitals Group     BP 05/30/15 2351 150/93 mmHg     Pulse Rate 05/30/15 2351 94     Resp 05/30/15 2351 20     Temp 05/30/15 2351 99 F (37.2 C)     Temp Source 05/30/15 2351 Oral     SpO2 05/30/15 2351 99 %     Weight 05/30/15 2351 190 lb (86.183 kg)     Height 05/30/15 2351 6\' 1"  (1.854 m)     Head Cir --      Peak Flow --      Pain Score 05/30/15 2354 9     Pain Loc --      Pain Edu? --      Excl. in [place]? --      Constitutional:  Alert and oriented. Apparent distress Eyes: Conjunctivae are normal. PERRL. Normal extraocular movements. ENT   Head: Normocephalic and atraumatic.   Nose: No congestion/rhinnorhea.   Mouth/Throat: Mucous membranes are moist.   Neck: No stridor. Hematological/Lymphatic/Immunilogical: No cervical lymphadenopathy. Cardiovascular: Normal rate, regular rhythm. Normal and symmetric distal pulses are present in all extremities. No murmurs, rubs, or gallops. Respiratory: Normal respiratory effort without tachypnea nor retractions. Breath sounds are clear and equal bilaterally. No wheezes/rales/rhonchi. Gastrointestinal: Soft and nontender. No distention. There is no CVA tenderness. Genitourinary: deferred Musculoskeletal: Nontender with normal range of motion in all extremities. No joint effusions.  No lower extremity tenderness nor edema. Neurologic:  Normal speech and language. No gross focal neurologic deficits are appreciated. Speech is normal.  Skin:  Skin is warm, dry and intact. No rash noted. Psychiatric: Mood and affect are normal. Speech and behavior are normal. Patient exhibits appropriate insight and judgment.  ____________________________________________    LABS (pertinent positives/negatives)  Labs Reviewed  TROPONIN I  BASIC METABOLIC PANEL  CBC     ____________________________________________   EKG  ED ECG REPORT I, Guliana Weyandt, [place], the attending physician, personally viewed and interpreted this ECG.   Date: 05/31/2015  EKG Time: 11:44 PM  Rate: 81  Rhythm: normal sinus rhythm  Axis: none  Intervals:normal  ST&T Change: normal    INITIAL IMPRESSION / ASSESSMENT AND PLAN / ED COURSE  Pertinent labs & imaging results that were available during my care of the patient were reviewed by me and considered in my medical decision making (see chart for details).  Patient received IV morphine 4 mg and metoprolol 5 mggiven patient's ongoing chest pain  concern for potential cardiac etiology as such patient will be admitted to the hospitalist staff Dr. Lavetta Nielsen for further evaluation  ____________________________________________   FINAL CLINICAL IMPRESSION(S) / ED DIAGNOSES  Final diagnoses:  None      Gregor Hams, MD 05/31/15 0157

## 2015-05-31 NOTE — ED Notes (Signed)
MD at bedside. 

## 2015-05-31 NOTE — Progress Notes (Signed)
Johnny Robinson is a 49 y.o. male  <principal problem not specified>   SUBJECTIVE:  Pt admitted for CP. Currently pain-free. Enzymes negative. CT angio chest/abdomen/pelvis negative except for known recent renal infarct. Renal fxn stable.  ______________________________________________________________________  ROS: Review of systems is unremarkable for any active cardiac,respiratory, GI, GU, hematologic, neurologic or psychiatric systems, 10 systems reviewed.  @CMEDLIST @  Past Medical History  Diagnosis Date  . Reflux   . GERD (gastroesophageal reflux disease)     Past Surgical History  Procedure Laterality Date  . Back surgery    . Peripheral vascular catheterization Right 05/18/2015    Procedure: Renal Angiography;  Surgeon: Algernon Huxley, MD;  Location: [place] [facility];  Service: Cardiovascular;  Laterality: Right;  . Peripheral vascular catheterization Bilateral 05/18/2015    Procedure: Renal Intervention;  Surgeon: Algernon Huxley, MD;  Location: [place] [facility];  Service: Cardiovascular;  Laterality: Bilateral;    PHYSICAL EXAM:  BP 143/84 mmHg  Pulse 78  Temp(Src) 98.6 F (37 C) (Oral)  Resp 19  Ht 6\' 1"  (1.854 m)  Wt 86.183 kg (190 lb)  BMI 25.07 kg/m2  SpO2 97%  Wt Readings from Last 3 Encounters:  05/30/15 86.183 kg (190 lb)  05/26/15 89.359 kg (197 lb)  05/16/15 87.862 kg (193 lb 11.2 oz)            Constitutional: NAD Neck: supple, no thyromegaly Respiratory: CTA, no rales or wheezes Cardiovascular: RRR, no murmur, no gallop Abdomen: soft, good BS, nontender Extremities: no edema Neuro: alert and oriented, no focal motor or sensory deficits  ASSESSMENT/PLAN:  Labs and imaging studies were reviewed  Will increase lopressor to 25mg  po BID. Continue Eliquis. Recent echo OK. Complete series of cardiac enzymes. Cardiology consult today.

## 2015-05-31 NOTE — Progress Notes (Signed)
*  PRELIMINARY RESULTS* Echocardiogram 2D Echocardiogram has been performed.  Johnny Robinson 05/31/2015, 9:11 AM

## 2015-05-31 NOTE — Progress Notes (Signed)
Initial Nutrition Assessment   INTERVENTION:   Meals and Snacks: Cater to patient preferences; will send snacks between meals Medical Food Supplement Therapy: pt agreeable to trying a [employer], will send daily   NUTRITION DIAGNOSIS:   Inadequate oral intake related to nausea, acute illness as evidenced by per patient/family report.  GOAL:   Patient will meet greater than or equal to 90% of their needs   MONITOR:    (Energy Intake, Anthropometrics, Electrolyte/Renal Profile, Digestive System)  REASON FOR ASSESSMENT:  Malnutrition Screening Tool  ASSESSMENT:    Pt admitted with chest pain, essential HTN, renal infarct  Past Medical History  Diagnosis Date  . Reflux   . GERD (gastroesophageal reflux disease)     Diet Order:  Diet Heart Room service appropriate?: Yes; Fluid consistency:: Thin  Energy Intake: no intake recorded, pt ate soup, some fruit at lunch today  Food and Nutrition Related History: reports poor appetite and intake for 3 weeks due to nausea, no vomiting, able to keep some food down, just eating less than normal  Digestive System: +nausea for 3 weeks, no nausea at present; no vomiting  Last BM:  9/12  Nutrition Focused Physical Exam: Nutrition-Focused physical exam completed. Findings are WDL for fat depletion, muscle depletion, and edema.   Electrolyte and Renal Profile:  Recent Labs Lab 05/30/15 1659 05/30/15 1750 05/31/15 0413  BUN 13 16 15   CREATININE 1.45* 1.50* 1.50*  NA 138 138 136  K 3.7 3.5 4.1   Glucose Profile: No results for input(s): GLUCAP in the last 72 hours. Protein Profile:  Recent Labs Lab 05/26/15 0641  ALBUMIN 4.2   Meds: reviewed  Height:   Ht Readings from Last 1 Encounters:  05/30/15 6\' 1"  (1.854 m)    Weight: reports wt loss of 10 pounds in 3 weeks; 5.3% wt loss  Wt Readings from Last 1 Encounters:  05/30/15 190 lb (86.183 kg)    BMI:  Body mass index is 25.07 kg/(m^2).     Estimated Nutritional Needs:   Kcal:  2150-2580 kcals   Protein:  86-103 g (1.0-1.2 g/kg)   Fluid:  2150-2580 mL  MODERATE Care Level  Kerman Passey MS, RD, LDN [phone] Pager

## 2015-05-31 NOTE — H&P (Signed)
[facility] NAME: Johnny Robinson    MR#:  [id number]  DATE OF BIRTH:  1966-05-30   DATE OF ADMISSION:  05/31/2015  PRIMARY CARE PHYSICIAN: Idelle Crouch, MD   REQUESTING/REFERRING PHYSICIAN: Owens Shark  CHIEF COMPLAINT:   Chief Complaint  Patient presents with  . Chest Pain    HISTORY OF PRESENT ILLNESS:  Johnny Robinson  is a 49 y.o. male with a known history of renal infarction presenting with chest pain. Acute onset chest pain retrosternal in location, pressure in quality, 10/10 intensity and nonradiating, no worsening or relieving factors. Associated shortness of breath. This occurred while at rest. He originally went to [facility] noted to have elevated blood pressure (150s/80s) and was ready for admission however he left [place] and presented to our facility due to multiple delays in care. Currently chest pain-free  PAST MEDICAL HISTORY:   Past Medical History  Diagnosis Date  . Reflux   . GERD (gastroesophageal reflux disease)     PAST SURGICAL HISTORY:   Past Surgical History  Procedure Laterality Date  . Back surgery    . Peripheral vascular catheterization Right 05/18/2015    Procedure: Renal Angiography;  Surgeon: Algernon Huxley, MD;  Location: [place];  Service: Cardiovascular;  Laterality: Right;  . Peripheral vascular catheterization Bilateral 05/18/2015    Procedure: Renal Intervention;  Surgeon: Algernon Huxley, MD;  Location: [place] [facility];  Service: Cardiovascular;  Laterality: Bilateral;    SOCIAL HISTORY:   Social History  Substance Use Topics  . Smoking status: Never Smoker   . Smokeless tobacco: Never Used  . Alcohol Use: Yes     Comment: occasional    FAMILY HISTORY:   Family History  Problem Relation Age of Onset  . Nephrolithiasis Mother   . Cancer Father     DRUG ALLERGIES:  No Known Allergies  REVIEW OF SYSTEMS:  REVIEW OF SYSTEMS:   CONSTITUTIONAL: Denies fevers, chills, fatigue, weakness.  EYES: Denies blurred vision, double vision, or eye pain.  EARS, NOSE, THROAT: Denies tinnitus, ear pain, hearing loss.  RESPIRATORY: denies cough, positive shortness of breath, denies wheezing  CARDIOVASCULAR: Positive chest pain, denies palpitations, edema.  GASTROINTESTINAL: Denies nausea, vomiting, diarrhea, abdominal pain.  GENITOURINARY: Denies dysuria, hematuria.  ENDOCRINE: Denies nocturia or thyroid problems. HEMATOLOGIC AND LYMPHATIC: Denies easy bruising or bleeding.  SKIN: Denies rash or lesions.  MUSCULOSKELETAL: Denies pain in neck, back, shoulder, knees, hips, or further arthritic symptoms.  NEUROLOGIC: Denies paralysis, paresthesias.  PSYCHIATRIC: Denies anxiety or depressive symptoms. Otherwise full review of systems performed by me is negative.   MEDICATIONS AT HOME:   Prior to Admission medications   Medication Sig Start Date End Date Taking? Authorizing Provider  apixaban (ELIQUIS) 5 MG TABS tablet Take 1 tablet (5 mg total) by mouth 2 (two) times daily. 05/18/15  Yes Idelle Crouch, MD  HYDROcodone-acetaminophen [facility]) 10-325 MG per tablet Take 1 tablet by mouth every 4 (four) hours as needed for moderate pain. 05/18/15  Yes Idelle Crouch, MD  ibuprofen (ADVIL,MOTRIN) 200 MG tablet Take 400-600 mg by mouth every 6 (six) hours as needed for headache or mild pain.   Yes Historical Provider, MD  metoprolol tartrate (LOPRESSOR) 25 MG tablet Take 0.5 tablets (12.5 mg total) by mouth 2 (two) times daily. 05/28/15  Yes Katha Cabal, MD  omeprazole (PRILOSEC) 20 MG capsule Take 1 capsule by mouth daily. 09/27/14  Yes Historical Provider, MD  ondansetron (ZOFRAN) 4 MG tablet Take 4 mg by mouth every 8 (eight) hours as needed for nausea or vomiting.   Yes Historical Provider, MD      VITAL SIGNS:  Blood pressure 130/82, pulse 77, temperature 99 F (37.2 C), temperature source Oral, resp. rate 18, height 6\' 1"   (1.854 m), weight 190 lb (86.183 kg), SpO2 97 %.  PHYSICAL EXAMINATION:  VITAL SIGNS: Filed Vitals:   05/31/15 0150  BP: 130/82  Pulse: 77  Temp:   Resp: 18   GENERAL:48 y.o.male currently in no acute distress.  HEAD: Normocephalic, atraumatic.  EYES: Pupils equal, round, reactive to light. Extraocular muscles intact. No scleral icterus.  MOUTH: Moist mucosal membrane. Dentition intact. No abscess noted.  EAR, NOSE, THROAT: Clear without exudates. No external lesions.  NECK: Supple. No thyromegaly. No nodules. No JVD.  PULMONARY: Clear to ascultation, without wheeze rails or rhonci. No use of accessory muscles, Good respiratory effort. good air entry bilaterally CHEST: Nontender to palpation.  CARDIOVASCULAR: S1 and S2. Regular rate and rhythm. No murmurs, rubs, or gallops. No edema. Pedal pulses 2+ bilaterally.  GASTROINTESTINAL: Soft, nontender, nondistended. No masses. Positive bowel sounds. No hepatosplenomegaly.  MUSCULOSKELETAL: No swelling, clubbing, or edema. Range of motion full in all extremities.  NEUROLOGIC: Cranial nerves II through XII are intact. No gross focal neurological deficits. Sensation intact. Reflexes intact.  SKIN: No ulceration, lesions, rashes, or cyanosis. Skin warm and dry. Turgor intact.  PSYCHIATRIC: Mood, affect within normal limits. The patient is awake, alert and oriented x 3. Insight, judgment intact.    LABORATORY PANEL:   CBC  Recent Labs Lab 05/30/15 1659 05/30/15 1750  WBC 10.9*  --   HGB 15.3 18.7*  HCT 43.7 55.0*  PLT 288  --    ------------------------------------------------------------------------------------------------------------------  Chemistries   Recent Labs Lab 05/26/15 0641  05/30/15 1659 05/30/15 1750  NA 138  < > 138 138  K 4.0  < > 3.7 3.5  CL 103  < > 98* 96*  CO2 27  < > 31  --   GLUCOSE 124*  < > 113* 102*  BUN 26*  < > 13 16  CREATININE 1.46*  < > 1.45* 1.50*  CALCIUM 9.5  < > 9.8  --   AST 20  --    --   --   ALT 23  --   --   --   ALKPHOS 67  --   --   --   BILITOT 0.7  --   --   --   < > = values in this interval not displayed. ------------------------------------------------------------------------------------------------------------------  Cardiac Enzymes  Recent Labs Lab 05/31/15 0053  TROPONINI <0.03   ------------------------------------------------------------------------------------------------------------------  RADIOLOGY:  Dg Chest 2 View  05/30/2015   CLINICAL DATA:  Left side chest pain for 3 days  EXAM: CHEST  2 VIEW  COMPARISON:  05/17/2015  FINDINGS: Cardiomediastinal silhouette is stable. No acute infiltrate or pleural effusion. No pulmonary edema. Bony thorax is unremarkable.  IMPRESSION: No active cardiopulmonary disease.   Electronically Signed   By: Lahoma Crocker M.D.   On: 05/30/2015 18:33   Ct Angio Chest Aorta W/cm &/or Wo/cm  05/30/2015   CLINICAL DATA:  Mid chest pain onset 12 p.m. Recent admission for renal infarct. Clinical concern for aortic dissection.  EXAM: CT ANGIOGRAPHY CHEST, ABDOMEN AND PELVIS  TECHNIQUE: Multidetector CT imaging through the chest, abdomen and pelvis was performed using the standard protocol during bolus administration  of intravenous contrast. Multiplanar reconstructed images and MIPs were obtained and reviewed to evaluate the vascular anatomy.  CONTRAST:  154mL OMNIPAQUE IOHEXOL 350 MG/ML SOLN  COMPARISON:  05/26/2015; 05/17/2015  FINDINGS: CTA CHEST FINDINGS  Mediastinum/Nodes: Noncontrast images demonstrate no evidence of acute aortic intramural hematoma. Calcifications compatible with old granulomatous disease in the left hilar and infrahilar region, with small left lower lobe granulomas observed.  Systemic arterial phase images demonstrate no evidence of aortic dissection chest. Hypodensity within or along the right upper lobe pulmonary artery on image 36 of series 5 is attributed to streak artifact from the dense contrast medium in the  SVC. No other pulmonary arterial hypodensity is identified.  No pathologic thoracic adenopathy.  There is mild cardiomegaly.  Lungs/Pleura: Calcified granuloma near the minor fissure, image 49 series 6, in the lower part of the right upper lobe.  Bilateral dependent subsegmental atelectasis.  Musculoskeletal: Thoracic spondylosis.  Review of the MIP images confirms the above findings.  CTA ABDOMEN AND PELVIS FINDINGS  Hepatobiliary: Unremarkable  Pancreas: Unremarkable  Spleen: Unremarkable  Adrenals/Urinary Tract: Geographic infarct of the right kidney primarily involving the upper pole, distribution node not changed from prior.  Stomach/Bowel: Descending and sigmoid colon diverticulosis. There is some mild wall thickening throughout much of the colon, query low grade colitis.  Vascular/Lymphatic: There is abnormal luminal hypodensity in the left renal artery severely restricting the caliber of the rib right renal artery as shown on images 55 through 70 of series 8, with patency of a branch extending to the mid kidney and lower pole but thrombosis of the branch extending to the involved segment of the upper pole. This is a fairly similar appearance to the prior exam. 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 The celiac trunk, superior mesenteric artery, inferior mesenteric artery, and left renal artery appear unremarkable. There is very little in the way of atherosclerotic calcification. Pelvic vasculature unremarkable.  Reproductive: Unremarkable  Other: Trace free pelvic fluid, significance uncertain.  Musculoskeletal: Lumbar spondylosis and degenerative disc disease causing mild right foraminal impingement at L4-5 and mild left foraminal impingement at L4-5 and L5-S1. Disc bulge and spurring appear to cause mild central narrowing of the thecal sac at the L1-2 and L2-3 levels. Multilevel Schmorl's nodes.  Review of the MIP  images confirms the above findings.  IMPRESSION: 1. 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 This is the only dissection observed; no aortic dissection or other vascular dissection in the chest, abdomen, or pelvis is seen. This could warrant intervention to prevent further occlusion of the right renal artery, correlate with prior disposition from 05/26/2015 with regard to vascular surgery or interventional radiology assessment. 2. Old granulomatous disease. 3. Descending and sigmoid colon diverticulosis. Possible low grade colitis given the slight diffuse colon wall thickening. 4. Lumbar spondylosis and degenerative disc disease causing mild multilevel impingement.   Electronically Signed   By: Van Clines M.D.   On: 05/30/2015 19:48   Ct Cta Abd/pel W/cm &/or W/o Cm  05/30/2015   CLINICAL DATA:  Mid chest pain onset 12 p.m. Recent admission for renal infarct. Clinical concern for aortic dissection.  EXAM: CT ANGIOGRAPHY CHEST, ABDOMEN AND PELVIS  TECHNIQUE: Multidetector CT imaging through the chest, abdomen and pelvis was performed using the standard protocol during bolus administration of intravenous contrast. Multiplanar reconstructed images and MIPs were obtained and reviewed to evaluate the vascular anatomy.  CONTRAST:  1107mL OMNIPAQUE IOHEXOL 350 MG/ML SOLN  COMPARISON:  05/26/2015; 05/17/2015  FINDINGS: CTA CHEST FINDINGS  Mediastinum/Nodes: Noncontrast images demonstrate no evidence of acute aortic intramural hematoma. Calcifications compatible with old granulomatous disease in the left hilar and infrahilar region, with small left lower lobe granulomas observed.  Systemic arterial phase images demonstrate no evidence of aortic dissection chest.  Hypodensity within or along the right upper lobe pulmonary artery on image 36 of series 5 is attributed to streak artifact from the dense contrast medium in the SVC. No other pulmonary arterial hypodensity is identified.  No pathologic thoracic adenopathy.  There is mild cardiomegaly.  Lungs/Pleura: Calcified granuloma near the minor fissure, image 49 series 6, in the lower part of the right upper lobe.  Bilateral dependent subsegmental atelectasis.  Musculoskeletal: Thoracic spondylosis.  Review of the MIP images confirms the above findings.  CTA ABDOMEN AND PELVIS FINDINGS  Hepatobiliary: Unremarkable  Pancreas: Unremarkable  Spleen: Unremarkable  Adrenals/Urinary Tract: Geographic infarct of the right kidney primarily involving the upper pole, distribution node not changed from prior.  Stomach/Bowel: Descending and sigmoid colon diverticulosis. There is some mild wall thickening throughout much of the colon, query low grade colitis.  Vascular/Lymphatic: There is abnormal luminal hypodensity in the left renal artery severely restricting the caliber of the rib right renal artery as shown on images 55 through 70 of series 8, with patency of a branch extending to the mid kidney and lower pole but thrombosis of the branch extending to the involved segment of the upper pole. This is a fairly similar appearance to the prior exam. 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 The celiac trunk, superior mesenteric artery, inferior mesenteric artery, and left renal artery appear unremarkable. There is very little in the way of atherosclerotic calcification. Pelvic vasculature unremarkable.  Reproductive: Unremarkable  Other: Trace free pelvic fluid, significance uncertain.  Musculoskeletal: Lumbar spondylosis and degenerative disc disease causing mild right foraminal impingement at L4-5 and mild left foraminal impingement at L4-5 and  L5-S1. Disc bulge and spurring appear to cause mild central narrowing of the thecal sac at the L1-2 and L2-3 levels. Multilevel Schmorl's nodes.  Review of the MIP images confirms the above findings.  IMPRESSION: 1. 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 This is the only dissection observed; no aortic dissection or other vascular dissection in the chest, abdomen, or pelvis is seen. This could warrant intervention to prevent further occlusion of the right renal artery, correlate with prior disposition from 05/26/2015 with regard to vascular surgery or interventional radiology assessment. 2. Old granulomatous disease. 3. Descending and sigmoid colon diverticulosis. Possible low grade colitis given the slight diffuse colon wall thickening. 4. Lumbar spondylosis and degenerative disc disease causing mild multilevel impingement.   Electronically Signed   By: Van Clines M.D.   On: 05/30/2015 19:48    EKG:   Orders placed or performed during the hospital encounter of 05/31/15  . EKG 12-Lead  .  EKG 12-Lead    IMPRESSION AND PLAN:   49 year old Caucasian gentleman history of recent renal infarction presenting with chest pain which occurred at rest  1. Chest pain, central: Place on telemetry, trend cardiac enzymes 3, initiate aspirin/statin therapy. Add morphine and nitroglycerin as needed for pain. It appears his symptoms correlated with elevated blood pressure now that his blood pressure has improved he is no longer symptomatic. I question which came first the pain versus blood pressure. 2. Essential hypertension: Blood pressure currently controlled his renal infarction likely plays a role in elevated blood pressure, if remains elevated will need a  chronic agent 3. Renal infarction: Continue anticoagulation 4. Venous thromboembolism prophylactic: Therapeutic Eliquis    All the records are reviewed and case discussed with ED provider. Management plans discussed with the patient, family and they are in agreement.  CODE STATUS: Full  TOTAL TIME TAKING CARE OF THIS PATIENT: 35 minutes.    Hower,  Karenann Cai.D on 05/31/2015 at 1:59 AM  Between 7am to 6pm - Pager - [phone]  After 6pm: House Pager: - [facility]  Office  [phone]  CC: Primary care physician; Idelle Crouch, MD

## 2015-05-31 NOTE — ED Notes (Signed)
Pt ambulatory to and from restroom without incident.

## 2015-05-31 NOTE — Consult Note (Signed)
[facility] Cardiology  CARDIOLOGY CONSULT NOTE  Patient ID: Johnny Robinson MRN: [id number] DOB/AGE: [birth year] 47 y.o.  Admit date: 05/31/2015 Referring Physician Georgie Chard M.D. Primary Physician Georgie Chard M.D. Primary Cardiologist Reason for Consultation chest pain  HPI: 49 year old gentleman referred for evaluation of chest pain. 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 CT scan was most consistent with renal infarct, and right renal artery luminal narrowing suspicious for dissection and thrombus. The patient was evaluated by Dr. Ronalee Belts, renal angiography did not reveal evidence for fibromuscular dysplasia, results consistent with spontaneous dissection, stent was not deployed. The patient was treated with Eliquis. The patient returned on 05/26/15 with recurrent abdominal discomfort with repeat CT scan revealing increase in right renal infarct size, felt to be most consistent with extension of original section. Renal stent was deferred, risk felt to outweigh benefit, with potential catastrophic outcome.  Last night, the patient developed 10 out of 10 chest discomfort, presented to [facility] emergency room. Repeat CT scan confirmed right upper pole renal infarct, luminal narrowing of the right renal artery consistent with prior CT scan, without evidence for pulmonary embolus, or coronary dissection. Lab work included negative troponin. EKG revealed normal sinus rhythm, normal ECG. The patient describes substernal chest discomfort, worse upon breathing, unrelated to position, actually improved when he lays back. Symptoms improved after intravenous morphine and Dilaudid. Patient was hypertensive, with blood pressures in the 150s/80s.    Review of systems complete and found to be negative unless listed above     Past Medical History  Diagnosis Date  . Reflux   . GERD (gastroesophageal reflux disease)     Past Surgical History   Procedure Laterality Date  . Back surgery    . Peripheral vascular catheterization Right 05/18/2015    Procedure: Renal Angiography;  Surgeon: Algernon Huxley, MD;  Location: [place] [facility];  Service: Cardiovascular;  Laterality: Right;  . Peripheral vascular catheterization Bilateral 05/18/2015    Procedure: Renal Intervention;  Surgeon: Algernon Huxley, MD;  Location: [place] [facility];  Service: Cardiovascular;  Laterality: Bilateral;    Prescriptions prior to admission  Medication Sig Dispense Refill Last Dose  . apixaban (ELIQUIS) 5 MG TABS tablet Take 1 tablet (5 mg total) by mouth 2 (two) times daily. 60 tablet 5 05/30/2015 at 0900  . HYDROcodone-acetaminophen (NORCO) 10-325 MG per tablet Take 1 tablet by mouth every 4 (four) hours as needed for moderate pain. 30 tablet 0 05/30/2015 at 0900  . ibuprofen (ADVIL,MOTRIN) 200 MG tablet Take 400-600 mg by mouth every 6 (six) hours as needed for headache or mild pain.   05/30/2015 at Unknown time  . metoprolol tartrate (LOPRESSOR) 25 MG tablet Take 0.5 tablets (12.5 mg total) by mouth 2 (two) times daily. 60 tablet 5 05/30/2015 at 0900  . omeprazole (PRILOSEC) 20 MG capsule Take 1 capsule by mouth daily.   05/30/2015 at Unknown time  . ondansetron (ZOFRAN) 4 MG tablet Take 4 mg by mouth every 8 (eight) hours as needed for nausea or vomiting.   05/30/2015 at Unknown time   Social History   Social History  . Marital Status: Married    Spouse Name: N/A  . Number of Children: N/A  . Years of Education: N/A   Occupational History  . Not on file.   Social History Main Topics  . Smoking status: Never Smoker   . Smokeless tobacco: Never Used  .  Alcohol Use: Yes     Comment: occasional  . Drug Use: No  . Sexual Activity: Not on file   Other Topics Concern  . Not on file   Social History Narrative    Family History  Problem Relation Age of Onset  . Nephrolithiasis Mother   . Cancer Father       Review of systems complete and found to  be negative unless listed above      PHYSICAL EXAM  General: Well developed, well nourished, in no acute distress HEENT:  Normocephalic and atramatic Neck:  No JVD.  Lungs: Clear bilaterally to auscultation and percussion. Heart: HRRR . Normal S1 and S2 without gallops or murmurs.  Abdomen: Bowel sounds are positive, abdomen soft and non-tender  Msk:  Back normal, normal gait. Normal strength and tone for age. Extremities: No clubbing, cyanosis or edema.   Neuro: Alert and oriented X 3. Psych:  Good affect, responds appropriately  Labs:   Lab Results  Component Value Date   WBC 11.8* 05/31/2015   HGB 14.0 05/31/2015   HCT 40.8 05/31/2015   MCV 91.9 05/31/2015   PLT 244 05/31/2015    Recent Labs Lab 05/26/15 0641  05/31/15 0413  NA 138  < > 136  K 4.0  < > 4.1  CL 103  < > 96*  CO2 27  < > 31  BUN 26*  < > 15  CREATININE 1.46*  < > 1.50*  CALCIUM 9.5  < > 9.2  PROT 7.0  --   --   BILITOT 0.7  --   --   ALKPHOS 67  --   --   ALT 23  --   --   AST 20  --   --   GLUCOSE 124*  < > 138*  < > = values in this interval not displayed. Lab Results  Component Value Date   TROPONINI <0.03 05/31/2015   No results found for: CHOL No results found for: HDL No results found for: LDLCALC No results found for: TRIG No results found for: CHOLHDL No results found for: LDLDIRECT    Radiology: Dg Chest 2 View  05/30/2015   CLINICAL DATA:  Left side chest pain for 3 days  EXAM: CHEST  2 VIEW  COMPARISON:  05/17/2015  FINDINGS: Cardiomediastinal silhouette is stable. No acute infiltrate or pleural effusion. No pulmonary edema. Bony thorax is unremarkable.  IMPRESSION: No active cardiopulmonary disease.   Electronically Signed   By: Lahoma Crocker M.D.   On: 05/30/2015 18:33   Ct Abdomen Pelvis W Contrast  05/26/2015   CLINICAL DATA:  Acute right-sided abdominal pain.  EXAM: CT ABDOMEN AND PELVIS WITH CONTRAST  TECHNIQUE: Multidetector CT imaging of the abdomen and pelvis was performed  using the standard protocol following bolus administration of intravenous contrast.  CONTRAST:  114mL OMNIPAQUE IOHEXOL 350 MG/ML SOLN  COMPARISON:  CT scan of May 16, 2015.  FINDINGS: Multilevel degenerative disc disease is noted in the lumbar spine. Visualized lung bases appear normal.  No gallstones are noted. The liver, spleen and pancreas appear normal. Adrenal glands appear normal. There is no evidence of abdominal aortic aneurysm or dissection. No hydronephrosis or renal obstruction is noted. No renal or ureteral calculi are noted. Left kidney appears normal. There is area of non enhancement involving the upper and middle pole of the right kidney which is increased in size compared to prior exam, consistent with worsening renal infarction. Embolic etiology cannot be excluded. On  the delayed post-contrast images, there is decreased enhancement of the posterior margin of the right kidney as well, suggesting evidence of early infarction in this area. Retro aortic left renal vein is noted. Left renal artery is widely patent. There appears to be significant long length narrowing within the right renal artery which has significantly progressed since prior exam, and is concerning for dissection or possibly thrombus.  There is no evidence of bowel obstruction. The appendix appears normal. No abnormal fluid collection is noted. Sigmoid diverticulosis is noted without inflammation. Urinary bladder appears normal. No significant adenopathy is noted.  IMPRESSION: Compared to prior exam, there is a larger area of infarction involving the upper and middle pole of the right kidney, with delayed post-contrast images suggesting early ischemic changes involving the entire posterior portion of the right kidney. There is noted severe luminal narrowing throughout almost the entire right renal artery which is increased compared to prior exam, concerning for possible dissection or thrombus. Critical Value/emergent results were  called by telephone at the time of interpretation on 05/26/2015 at 7:28 am to Dr. Owens Shark, who verbally acknowledged these results.   Electronically Signed   By: Marijo Conception, M.D.   On: 05/26/2015 07:29   Ct Abdomen Pelvis W Contrast  05/16/2015   CLINICAL DATA:  Abrupt onset severe right lower quadrant pain. Initial encounter.  EXAM: CT ABDOMEN AND PELVIS WITH CONTRAST  TECHNIQUE: Multidetector CT imaging of the abdomen and pelvis was performed using the standard protocol following bolus administration of intravenous contrast.  CONTRAST:  120mL OMNIPAQUE IOHEXOL 300 MG/ML  SOLN  COMPARISON:  None.  FINDINGS: BODY WALL: No contributory findings.  LOWER CHEST: Calcified granulomas in the left lower lobe.  ABDOMEN/PELVIS:  Liver: No focal abnormality.  Biliary: No evidence of biliary obstruction or stone.  Pancreas: Unremarkable.  Spleen: Unremarkable.  Adrenals: Unremarkable.  Kidneys and ureters: There is a wedge-shaped well-defined area of nonenhancement involving the upper pole right kidney, likely capsular/ upper lobar branch occlusion (this vessel is relatively hypoenhancing). Additionally, there is focal luminal narrowing the level of the right renal artery bifurcation, near were the capsular branch arises, suggesting thrombus or focal dissection. No overt FMD changes, although not a CTA. No additional infarct noted.  Bladder: Unremarkable.  Reproductive: No pathologic findings.  Bowel: No obstruction. No appendicitis.  Retroperitoneum: No mass or adenopathy.  Peritoneum: No ascites or pneumoperitoneum.  Vascular: No acute abnormality.  OSSEOUS: No acute abnormalities. Extensive degenerative disc disease and facet arthropathy for age, focally advanced at L2-3 where there is retrolisthesis and advanced disc narrowing.  These results were called by telephone at the time of interpretation on 05/16/2015 at 2:20 pm to Dr. Lenise Arena , who verbally acknowledged these results.  IMPRESSION: Abnormal upper pole  pole right kidney most consistent with renal infarct (pyelonephritis can have a similar appearance and UA recommended). Focal right renal artery bifurcation luminal narrowing suspicious for dissection/thrombosis, with capsular branch involvement explaining the infarct. Based on location and patient age, question underlying fibromuscular dysplasia. Renal CTA could further evaluate as clinically indicated.   Electronically Signed   By: Monte Fantasia M.D.   On: 05/16/2015 14:23   Ct Angio Chest Aorta W/cm &/or Wo/cm  05/30/2015   CLINICAL DATA:  Mid chest pain onset 12 p.m. Recent admission for renal infarct. Clinical concern for aortic dissection.  EXAM: CT ANGIOGRAPHY CHEST, ABDOMEN AND PELVIS  TECHNIQUE: Multidetector CT imaging through the chest, abdomen and pelvis was performed using the standard protocol during  bolus administration of intravenous contrast. Multiplanar reconstructed images and MIPs were obtained and reviewed to evaluate the vascular anatomy.  CONTRAST:  162mL OMNIPAQUE IOHEXOL 350 MG/ML SOLN  COMPARISON:  05/26/2015; 05/17/2015  FINDINGS: CTA CHEST FINDINGS  Mediastinum/Nodes: Noncontrast images demonstrate no evidence of acute aortic intramural hematoma. Calcifications compatible with old granulomatous disease in the left hilar and infrahilar region, with small left lower lobe granulomas observed.  Systemic arterial phase images demonstrate no evidence of aortic dissection chest. Hypodensity within or along the right upper lobe pulmonary artery on image 36 of series 5 is attributed to streak artifact from the dense contrast medium in the SVC. No other pulmonary arterial hypodensity is identified.  No pathologic thoracic adenopathy.  There is mild cardiomegaly.  Lungs/Pleura: Calcified granuloma near the minor fissure, image 49 series 6, in the lower part of the right upper lobe.  Bilateral dependent subsegmental atelectasis.  Musculoskeletal: Thoracic spondylosis.  Review of the MIP images  confirms the above findings.  CTA ABDOMEN AND PELVIS FINDINGS  Hepatobiliary: Unremarkable  Pancreas: Unremarkable  Spleen: Unremarkable  Adrenals/Urinary Tract: Geographic infarct of the right kidney primarily involving the upper pole, distribution node not changed from prior.  Stomach/Bowel: Descending and sigmoid colon diverticulosis. There is some mild wall thickening throughout much of the colon, query low grade colitis.  Vascular/Lymphatic: There is abnormal luminal hypodensity in the left renal artery severely restricting the caliber of the rib right renal artery as shown on images 55 through 70 of series 8, with patency of a branch extending to the mid kidney and lower pole but thrombosis of the branch extending to the involved segment of the upper pole. This is a fairly similar appearance to the prior exam. 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 The celiac trunk, superior mesenteric artery, inferior mesenteric artery, and left renal artery appear unremarkable. There is very little in the way of atherosclerotic calcification. Pelvic vasculature unremarkable.  Reproductive: Unremarkable  Other: Trace free pelvic fluid, significance uncertain.  Musculoskeletal: Lumbar spondylosis and degenerative disc disease causing mild right foraminal impingement at L4-5 and mild left foraminal impingement at L4-5 and L5-S1. Disc bulge and spurring appear to cause mild central narrowing of the thecal sac at the L1-2 and L2-3 levels. Multilevel Schmorl's nodes.  Review of the MIP images confirms the above findings.  IMPRESSION: 1. 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 This is the only  dissection observed; no aortic dissection or other vascular dissection in the chest, abdomen, or pelvis is seen. This could warrant intervention to prevent further occlusion of the right renal artery, correlate with prior disposition from 05/26/2015 with regard to vascular surgery or interventional radiology assessment. 2. Old granulomatous disease. 3. Descending and sigmoid colon diverticulosis. Possible low grade colitis given the slight diffuse colon wall thickening. 4. Lumbar spondylosis and degenerative disc disease causing mild multilevel impingement.   Electronically Signed   By: Van Clines M.D.   On: 05/30/2015 19:48   Ct Angio Chest Aorta W/cm &/or Wo/cm  05/17/2015   CLINICAL DATA:  Left anterior chest pain since this morning. Patient admitted for abdominal pain due to right renal infarct. Abdominal pain improved on heparin  drip.  EXAM: CT ANGIOGRAPHY CHEST WITH CONTRAST  TECHNIQUE: Multidetector CT imaging of the chest was performed using the standard protocol during bolus administration of intravenous contrast. Multiplanar CT image reconstructions and MIPs were obtained to evaluate the vascular anatomy.  CONTRAST:  96mL OMNIPAQUE IOHEXOL 350 MG/ML SOLN  COMPARISON:  CT abdomen/ pelvis 05/16/2015.  FINDINGS: Lungs are adequately inflated without consolidation or effusion. There is subtle bibasilar dependent atelectasis. Tiny calcification along the left major fissure and adjacent the right minor fissure as well as focal sub cm pleural-based calcification over the medial left lung base. Airways are normal.  Heart is normal in size. There is no evidence of pulmonary emboli. Thoracic aorta is normal in caliber without evidence of dissection or aneurysm. Takeoff of the great vessels is normal. There are a couple small calcified mediastinal left hilar lymph nodes. Remaining mediastinal structures are within normal.  Images through the upper abdomen again demonstrate low-attenuation over the upper  pole cortex of the right kidney thought to represent infarct. Minimal stranding of the perirenal fat anterior to the right mid to upper pole. The remainder of the exam is not significantly changed.  Review of the MIP images confirms the above findings.  IMPRESSION: No evidence pulmonary embolism. No evidence of thoracic aortic dissection or aneurysm.  No acute cardiopulmonary disease. Evidence of prior granulomas disease.  Stable changes over the upper pole of the right kidney thought to represent infarction and less likely pyelonephritis.   Electronically Signed   By: Marin Olp M.D.   On: 05/17/2015 12:21   Ct Cta Abd/pel W/cm &/or W/o Cm  05/30/2015   CLINICAL DATA:  Mid chest pain onset 12 p.m. Recent admission for renal infarct. Clinical concern for aortic dissection.  EXAM: CT ANGIOGRAPHY CHEST, ABDOMEN AND PELVIS  TECHNIQUE: Multidetector CT imaging through the chest, abdomen and pelvis was performed using the standard protocol during bolus administration of intravenous contrast. Multiplanar reconstructed images and MIPs were obtained and reviewed to evaluate the vascular anatomy.  CONTRAST:  110mL OMNIPAQUE IOHEXOL 350 MG/ML SOLN  COMPARISON:  05/26/2015; 05/17/2015  FINDINGS: CTA CHEST FINDINGS  Mediastinum/Nodes: Noncontrast images demonstrate no evidence of acute aortic intramural hematoma. Calcifications compatible with old granulomatous disease in the left hilar and infrahilar region, with small left lower lobe granulomas observed.  Systemic arterial phase images demonstrate no evidence of aortic dissection chest. Hypodensity within or along the right upper lobe pulmonary artery on image 36 of series 5 is attributed to streak artifact from the dense contrast medium in the SVC. No other pulmonary arterial hypodensity is identified.  No pathologic thoracic adenopathy.  There is mild cardiomegaly.  Lungs/Pleura: Calcified granuloma near the minor fissure, image 49 series 6, in the lower part of the  right upper lobe.  Bilateral dependent subsegmental atelectasis.  Musculoskeletal: Thoracic spondylosis.  Review of the MIP images confirms the above findings.  CTA ABDOMEN AND PELVIS FINDINGS  Hepatobiliary: Unremarkable  Pancreas: Unremarkable  Spleen: Unremarkable  Adrenals/Urinary Tract: Geographic infarct of the right kidney primarily involving the upper pole, distribution node not changed from prior.  Stomach/Bowel: Descending and sigmoid colon diverticulosis. There is some mild wall thickening throughout much of the colon, query low grade colitis.  Vascular/Lymphatic: There is abnormal luminal hypodensity in the left renal artery severely restricting the caliber of the rib right renal artery as shown on images 55 through 70 of series 8, with patency of a branch extending to the mid kidney and lower pole but thrombosis of the  branch extending to the involved segment of the upper pole. This is a fairly similar appearance to the prior exam. 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 The celiac trunk, superior mesenteric artery, inferior mesenteric artery, and left renal artery appear unremarkable. There is very little in the way of atherosclerotic calcification. Pelvic vasculature unremarkable.  Reproductive: Unremarkable  Other: Trace free pelvic fluid, significance uncertain.  Musculoskeletal: Lumbar spondylosis and degenerative disc disease causing mild right foraminal impingement at L4-5 and mild left foraminal impingement at L4-5 and L5-S1. Disc bulge and spurring appear to cause mild central narrowing of the thecal sac at the L1-2 and L2-3 levels. Multilevel Schmorl's nodes.  Review of the MIP images confirms the above findings.  IMPRESSION: 1. Similar distribution of renal infarct in the right kidney upper pole in right mid kidney, with irregular luminal narrowing and suspected dissection in the right renal artery,  including the right kidney upper pole branch, but narrowing the main renal artery proximal to the lower pole branch. This is the only dissection observed; no aortic dissection or other vascular dissection in the chest, abdomen, or pelvis is seen. This could warrant intervention to prevent further occlusion of the right renal artery, correlate with prior disposition from 05/26/2015 with regard to vascular surgery or interventional radiology assessment. 2. Old granulomatous disease. 3. Descending and sigmoid colon diverticulosis. Possible low grade colitis given the slight diffuse colon wall thickening. 4. Lumbar spondylosis and degenerative disc disease causing mild multilevel impingement.   Electronically Signed   By: Van Clines M.D.   On: 05/30/2015 19:48    EKG: Normal sinus rhythm, normal ECG  ASSESSMENT AND PLAN:   86. 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 Patient presents with pleuritic-type chest pain, negative troponin 3, and normal ECG, unlikely to be cardiac chest pain or coronary dissection. 2. 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  Recommendations  1. Continue Eliquis for now 2. Defer heparin bolus and drip 3. Agree with up titration of metoprolol for better blood pressure control 4. Repeat 2-D echocardiogram to rule out cardiac thrombus 5. Consider hypercoagulable workup 6. Defer cardiac catheterization at this time   Signed: Joas Motton MD,PhD, [facility] 05/31/2015, 8:55 AM

## 2015-05-31 NOTE — ED Notes (Signed)
45-50 mins Prior to this arrival the patient came to the desk to check in with c/o chest pain. Asked pt to step over to registration to be registered as soon as the current pt was finished getting registered. pt stated, "the sign says to tell the nurse if you have chest pain." Explained that that is correct but he must be registered before we can complete an EKG. Pt then turned around and walked out of the department, did not get registered. No distress noted. Josh EMT was witness to this encounter. When pt checked in during this current visit pts wife came to desk stating that her husband was sent to the back of the line when he was c/o chest pain. Explained to pts wife that pt was asked to register, in order to be able to obtain an EKG, with registration and that he left the department without doing so. Pts wife very upset and yelling at nurse first. Pt walked out again during this encounter. Wife finally agreed to register pt and Amy CNA had to take a wheelchair outside to the sidewalk in order to retrieve pt for the EKG. Amy CNA present throughout this encounter.

## 2015-06-01 LAB — BASIC METABOLIC PANEL
Anion gap: 7 (ref 5–15)
BUN: 24 mg/dL — AB (ref 6–20)
CO2: 31 mmol/L (ref 22–32)
CREATININE: 1.53 mg/dL — AB (ref 0.61–1.24)
Calcium: 9.1 mg/dL (ref 8.9–10.3)
Chloride: 99 mmol/L — ABNORMAL LOW (ref 101–111)
GFR, EST NON AFRICAN AMERICAN: 53 mL/min — AB (ref >60)
Glucose, Bld: 112 mg/dL — ABNORMAL HIGH (ref 65–99)
POTASSIUM: 3.8 mmol/L (ref 3.5–5.1)
SODIUM: 137 mmol/L (ref 135–145)

## 2015-06-01 LAB — CBC WITH DIFFERENTIAL/PLATELET
BASOS PCT: 3 %
Basophils Absolute: 0.3 10*3/uL — ABNORMAL HIGH (ref 0–0.1)
EOS ABS: 0.2 10*3/uL (ref 0–0.7)
EOS PCT: 2 %
HCT: 42.6 % (ref 40.0–52.0)
Hemoglobin: 14.7 g/dL (ref 13.0–18.0)
LYMPHS ABS: 1.4 10*3/uL (ref 1.0–3.6)
Lymphocytes Relative: 14 %
MCH: 31.8 pg (ref 26.0–34.0)
MCHC: 34.6 g/dL (ref 32.0–36.0)
MCV: 92 fL (ref 80.0–100.0)
Monocytes Absolute: 1.1 10*3/uL — ABNORMAL HIGH (ref 0.2–1.0)
Monocytes Relative: 12 %
Neutro Abs: 6.7 10*3/uL — ABNORMAL HIGH (ref 1.4–6.5)
Neutrophils Relative %: 69 %
PLATELETS: 245 10*3/uL (ref 150–440)
RBC: 4.63 MIL/uL (ref 4.40–5.90)
RDW: 11.8 % (ref 11.5–14.5)
WBC: 9.6 10*3/uL (ref 3.8–10.6)

## 2015-06-01 MED ORDER — METOPROLOL TARTRATE 25 MG PO TABS: 25.0000 mg | ORAL_TABLET | Freq: Two times a day (BID) | ORAL | Status: DC

## 2015-06-01 MED ORDER — ASPIRIN 81 MG PO TBEC: 81.0000 mg | DELAYED_RELEASE_TABLET | Freq: Every day | ORAL | Status: DC

## 2015-06-01 MED ORDER — AMLODIPINE BESYLATE 5 MG PO TABS
5.0000 mg | ORAL_TABLET | Freq: Every day | ORAL | Status: DC
  Administered 2015-06-01: 5 mg via ORAL
  Filled 2015-06-01: qty 1

## 2015-06-01 MED ORDER — NITROGLYCERIN 0.4 MG SL SUBL: 0.4000 mg | SUBLINGUAL_TABLET | SUBLINGUAL | Status: DC | PRN

## 2015-06-01 MED ORDER — OXYCODONE-ACETAMINOPHEN 5-325 MG PO TABS: 1.0000 | ORAL_TABLET | ORAL | Status: DC | PRN

## 2015-06-01 MED ORDER — AMLODIPINE BESYLATE 5 MG PO TABS
5.0000 mg | ORAL_TABLET | Freq: Every day | ORAL | Status: DC
Start: 2015-06-01 — End: 2022-02-19

## 2015-06-01 MED ORDER — ATORVASTATIN CALCIUM 20 MG PO TABS: 20.0000 mg | ORAL_TABLET | Freq: Every day | ORAL | Status: DC

## 2015-06-01 MED ORDER — NAPROXEN 500 MG PO TABS: 500.0000 mg | ORAL_TABLET | Freq: Two times a day (BID) | ORAL | Status: DC

## 2015-06-01 NOTE — Discharge Instructions (Signed)
Return to ER if worse.

## 2015-06-01 NOTE — Progress Notes (Signed)
Pt. Discharged to home via wc. Discharge instructions and medication regimen reviewed at bedside with patient. Pt. verbalizes understanding of instructions and medication regimen. Prescriptions included with discharge instructions. Patient assessment unchanged from this morning. TELE and IV discontinued per policy.

## 2015-06-01 NOTE — Discharge Summary (Signed)
Johnny Robinson, is a 49 y.o. male  DOB 07-01-1966  MRN [id number].  Admission date:  05/31/2015  Admitting Physician  Lytle Butte, MD  Discharge Date:  06/01/2015   Primary MD  Demitrus Francisco D, MD  Recommendations for primary care physician for things to follow:   BP, renal fxn   Admission Diagnosis  cp   Discharge Diagnosis  cp  , recent renal infarct with renal insufficiency, HTN  Active Problems:   Chest pain      Past Medical History  Diagnosis Date  . Reflux   . GERD (gastroesophageal reflux disease)     Past Surgical History  Procedure Laterality Date  . Back surgery    . Peripheral vascular catheterization Right 05/18/2015    Procedure: Renal Angiography;  Surgeon: Algernon Huxley, MD;  Location: [facility];  Service: Cardiovascular;  Laterality: Right;  . Peripheral vascular catheterization Bilateral 05/18/2015    Procedure: Renal Intervention;  Surgeon: Algernon Huxley, MD;  Location: [facility];  Service: Cardiovascular;  Laterality: Bilateral;       History of present illness and  Hospital Course:     Kindly see H&P for history of present illness and admission details, please review complete Labs, Consult reports and Test reports for all details in brief  HPI  from the history and physical done on the day of admission    Hospital Course    Pt admitted with CP after recent renal infarct. CT angiogram OK. Enzymes negative. Seen by Cardiology and cleared. Echo stable. Pain resolved.   Discharge Condition: stable   Follow UP  Follow-up Information    Follow up with Assia Meanor D, MD In 5 days.   Specialty:  Internal Medicine   Contact information:   [place] [address] [phone]         Discharge Instructions  and  Discharge Medications   See below     Medication List    STOP taking these medications         ibuprofen 200 MG tablet  Commonly known as:  ADVIL,MOTRIN      TAKE these medications        amLODipine 5 MG tablet  Commonly known as:  NORVASC  Take 1 tablet (5 mg total) by mouth daily.     apixaban 5 MG Tabs tablet  Commonly known as:  ELIQUIS  Take 1 tablet (5 mg total) by mouth 2 (two) times daily.     aspirin 81 MG EC tablet  Take 1 tablet (81 mg total) by mouth daily.     atorvastatin 20 MG tablet  Commonly known as:  LIPITOR  Take 1 tablet (20 mg total) by mouth daily at 6 PM.     HYDROcodone-acetaminophen 10-325 MG per tablet  Commonly known as:  NORCO  Take 1 tablet by mouth every 4 (four) hours as needed for moderate pain.     metoprolol tartrate 25 MG tablet  Commonly known as:  LOPRESSOR  Take 1 tablet (25 mg total) by mouth 2 (two) times daily.     naproxen 500 MG tablet  Commonly known as:  NAPROSYN  Take 1 tablet (500 mg total) by mouth 2 (two) times daily with a meal.  Notes to Patient:  5 days only     nitroGLYCERIN 0.4 MG SL tablet  Commonly known as:  NITROSTAT  Place 1 tablet (0.4 mg total) under the tongue every 5 (five) minutes as needed for chest pain.     omeprazole 20 MG capsule  Commonly known as:  PRILOSEC  Take 1 capsule by mouth daily.     ondansetron 4 MG tablet  Commonly known as:  ZOFRAN  Take 4 mg by mouth every 8 (eight) hours as needed for nausea or vomiting.          Diet and Activity recommendation: See Discharge Instructions above   Consults obtained - Cardiology   Major procedures and Radiology Reports - PLEASE review detailed and final reports for all details, in brief -   See below   Dg Chest 2 View  05/30/2015   CLINICAL DATA:  Left side chest pain for 3 days  EXAM: CHEST  2 VIEW  COMPARISON:  05/17/2015  FINDINGS: Cardiomediastinal silhouette is stable. No acute infiltrate or pleural effusion. No pulmonary edema. Bony thorax is unremarkable.  IMPRESSION: No active cardiopulmonary disease.   Electronically  Signed   By: Lahoma Crocker M.D.   On: 05/30/2015 18:33   Ct Abdomen Pelvis W Contrast  05/26/2015   CLINICAL DATA:  Acute right-sided abdominal pain.  EXAM: CT ABDOMEN AND PELVIS WITH CONTRAST  TECHNIQUE: Multidetector CT imaging of the abdomen and pelvis was performed using the standard protocol following bolus administration of intravenous contrast.  CONTRAST:  152mL OMNIPAQUE IOHEXOL 350 MG/ML SOLN  COMPARISON:  CT scan of May 16, 2015.  FINDINGS: Multilevel degenerative disc disease is noted in the lumbar spine. Visualized lung bases appear normal.  No gallstones are noted. The liver, spleen and pancreas appear normal. Adrenal glands appear normal. There is no evidence of abdominal aortic aneurysm or dissection. No hydronephrosis or renal obstruction is noted. No renal or ureteral calculi are noted. Left kidney appears normal. There is area of non enhancement involving the upper and middle pole of the right kidney which is increased in size compared to prior exam, consistent with worsening renal infarction. Embolic etiology cannot be excluded. On the delayed post-contrast images, there is decreased enhancement of the posterior margin of the right kidney as well, suggesting evidence of early infarction in this area. Retro aortic left renal vein is noted. Left renal artery is widely patent. There appears to be significant long length narrowing within the right renal artery which has significantly progressed since prior exam, and is concerning for dissection or possibly thrombus.  There is no evidence of bowel obstruction. The appendix appears normal. No abnormal fluid collection is noted. Sigmoid diverticulosis is noted without inflammation. Urinary bladder appears normal. No significant adenopathy is noted.  IMPRESSION: Compared to prior exam, there is a larger area of infarction involving the upper and middle pole of the right kidney, with delayed post-contrast images suggesting early ischemic changes  involving the entire posterior portion of the right kidney. There is noted severe luminal narrowing throughout almost the entire right renal artery which is increased compared to prior exam, concerning for possible dissection or thrombus. Critical Value/emergent results were called by telephone at the time of interpretation on 05/26/2015 at 7:28  am to Dr. Owens Shark, who verbally acknowledged these results.   Electronically Signed   By: Marijo Conception, M.D.   On: 05/26/2015 07:29   Ct Abdomen Pelvis W Contrast  05/16/2015   CLINICAL DATA:  Abrupt onset severe right lower quadrant pain. Initial encounter.  EXAM: CT ABDOMEN AND PELVIS WITH CONTRAST  TECHNIQUE: Multidetector CT imaging of the abdomen and pelvis was performed using the standard protocol following bolus administration of intravenous contrast.  CONTRAST:  189mL OMNIPAQUE IOHEXOL 300 MG/ML  SOLN  COMPARISON:  None.  FINDINGS: BODY WALL: No contributory findings.  LOWER CHEST: Calcified granulomas in the left lower lobe.  ABDOMEN/PELVIS:  Liver: No focal abnormality.  Biliary: No evidence of biliary obstruction or stone.  Pancreas: Unremarkable.  Spleen: Unremarkable.  Adrenals: Unremarkable.  Kidneys and ureters: There is a wedge-shaped well-defined area of nonenhancement involving the upper pole right kidney, likely capsular/ upper lobar branch occlusion (this vessel is relatively hypoenhancing). Additionally, there is focal luminal narrowing the level of the right renal artery bifurcation, near were the capsular branch arises, suggesting thrombus or focal dissection. No overt FMD changes, although not a CTA. No additional infarct noted.  Bladder: Unremarkable.  Reproductive: No pathologic findings.  Bowel: No obstruction. No appendicitis.  Retroperitoneum: No mass or adenopathy.  Peritoneum: No ascites or pneumoperitoneum.  Vascular: No acute abnormality.  OSSEOUS: No acute abnormalities. Extensive degenerative disc disease and facet arthropathy for age,  focally advanced at L2-3 where there is retrolisthesis and advanced disc narrowing.  These results were called by telephone at the time of interpretation on 05/16/2015 at 2:20 pm to Dr. Lenise Arena , who verbally acknowledged these results.  IMPRESSION: Abnormal upper pole pole right kidney most consistent with renal infarct (pyelonephritis can have a similar appearance and UA recommended). Focal right renal artery bifurcation luminal narrowing suspicious for dissection/thrombosis, with capsular branch involvement explaining the infarct. Based on location and patient age, question underlying fibromuscular dysplasia. Renal CTA could further evaluate as clinically indicated.   Electronically Signed   By: Monte Fantasia M.D.   On: 05/16/2015 14:23   Ct Angio Chest Aorta W/cm &/or Wo/cm  05/30/2015   CLINICAL DATA:  Mid chest pain onset 12 p.m. Recent admission for renal infarct. Clinical concern for aortic dissection.  EXAM: CT ANGIOGRAPHY CHEST, ABDOMEN AND PELVIS  TECHNIQUE: Multidetector CT imaging through the chest, abdomen and pelvis was performed using the standard protocol during bolus administration of intravenous contrast. Multiplanar reconstructed images and MIPs were obtained and reviewed to evaluate the vascular anatomy.  CONTRAST:  150mL OMNIPAQUE IOHEXOL 350 MG/ML SOLN  COMPARISON:  05/26/2015; 05/17/2015  FINDINGS: CTA CHEST FINDINGS  Mediastinum/Nodes: Noncontrast images demonstrate no evidence of acute aortic intramural hematoma. Calcifications compatible with old granulomatous disease in the left hilar and infrahilar region, with small left lower lobe granulomas observed.  Systemic arterial phase images demonstrate no evidence of aortic dissection chest. Hypodensity within or along the right upper lobe pulmonary artery on image 36 of series 5 is attributed to streak artifact from the dense contrast medium in the SVC. No other pulmonary arterial hypodensity is identified.  No pathologic  thoracic adenopathy.  There is mild cardiomegaly.  Lungs/Pleura: Calcified granuloma near the minor fissure, image 49 series 6, in the lower part of the right upper lobe.  Bilateral dependent subsegmental atelectasis.  Musculoskeletal: Thoracic spondylosis.  Review of the MIP images confirms the above findings.  CTA ABDOMEN AND PELVIS FINDINGS  Hepatobiliary: Unremarkable  Pancreas: Unremarkable  Spleen:  Unremarkable  Adrenals/Urinary Tract: Geographic infarct of the right kidney primarily involving the upper pole, distribution node not changed from prior.  Stomach/Bowel: Descending and sigmoid colon diverticulosis. There is some mild wall thickening throughout much of the colon, query low grade colitis.  Vascular/Lymphatic: There is abnormal luminal hypodensity in the left renal artery severely restricting the caliber of the rib right renal artery as shown on images 55 through 70 of series 8, with patency of a branch extending to the mid kidney and lower pole but thrombosis of the branch extending to the involved segment of the upper pole. This is a fairly similar appearance to the prior exam. 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 The celiac trunk, superior mesenteric artery, inferior mesenteric artery, and left renal artery appear unremarkable. There is very little in the way of atherosclerotic calcification. Pelvic vasculature unremarkable.  Reproductive: Unremarkable  Other: Trace free pelvic fluid, significance uncertain.  Musculoskeletal: Lumbar spondylosis and degenerative disc disease causing mild right foraminal impingement at L4-5 and mild left foraminal impingement at L4-5 and L5-S1. Disc bulge and spurring appear to cause mild central narrowing of the thecal sac at the L1-2 and L2-3 levels. Multilevel Schmorl's nodes.  Review of the MIP images confirms the above findings.  IMPRESSION: 1. Similar distribution of  renal infarct in the right kidney upper pole in right mid kidney, with irregular luminal narrowing and suspected dissection in the right renal artery, including the right kidney upper pole branch, but narrowing the main renal artery proximal to the lower pole branch. This is the only dissection observed; no aortic dissection or other vascular dissection in the chest, abdomen, or pelvis is seen. This could warrant intervention to prevent further occlusion of the right renal artery, correlate with prior disposition from 05/26/2015 with regard to vascular surgery or interventional radiology assessment. 2. Old granulomatous disease. 3. Descending and sigmoid colon diverticulosis. Possible low grade colitis given the slight diffuse colon wall thickening. 4. Lumbar spondylosis and degenerative disc disease causing mild multilevel impingement.   Electronically Signed   By: Van Clines M.D.   On: 05/30/2015 19:48   Ct Angio Chest Aorta W/cm &/or Wo/cm  05/17/2015   CLINICAL DATA:  Left anterior chest pain since this morning. Patient admitted for abdominal pain due to right renal infarct. Abdominal pain improved on heparin drip.  EXAM: CT ANGIOGRAPHY CHEST WITH CONTRAST  TECHNIQUE: Multidetector CT imaging of the chest was performed using the standard protocol during bolus administration of intravenous contrast. Multiplanar CT image reconstructions and MIPs were obtained to evaluate the vascular anatomy.  CONTRAST:  50mL OMNIPAQUE IOHEXOL 350 MG/ML SOLN  COMPARISON:  CT abdomen/ pelvis 05/16/2015.  FINDINGS: Lungs are adequately inflated without consolidation or effusion. There is subtle bibasilar dependent atelectasis. Tiny calcification along the left major fissure and adjacent the right minor fissure as well as focal sub cm pleural-based calcification over the medial left lung base. Airways are normal.  Heart is normal in size. There is no evidence of pulmonary emboli. Thoracic aorta is normal in caliber without  evidence of dissection or aneurysm. Takeoff of the great vessels is normal. There are a couple small calcified mediastinal left hilar lymph nodes. Remaining mediastinal structures are within normal.  Images through the upper abdomen again demonstrate low-attenuation over the upper pole cortex of the right kidney thought to represent infarct. Minimal stranding of the perirenal fat anterior to  the right mid to upper pole. The remainder of the exam is not significantly changed.  Review of the MIP images confirms the above findings.  IMPRESSION: No evidence pulmonary embolism. No evidence of thoracic aortic dissection or aneurysm.  No acute cardiopulmonary disease. Evidence of prior granulomas disease.  Stable changes over the upper pole of the right kidney thought to represent infarction and less likely pyelonephritis.   Electronically Signed   By: Marin Olp M.D.   On: 05/17/2015 12:21   Ct Cta Abd/pel W/cm &/or W/o Cm  05/30/2015   CLINICAL DATA:  Mid chest pain onset 12 p.m. Recent admission for renal infarct. Clinical concern for aortic dissection.  EXAM: CT ANGIOGRAPHY CHEST, ABDOMEN AND PELVIS  TECHNIQUE: Multidetector CT imaging through the chest, abdomen and pelvis was performed using the standard protocol during bolus administration of intravenous contrast. Multiplanar reconstructed images and MIPs were obtained and reviewed to evaluate the vascular anatomy.  CONTRAST:  15mL OMNIPAQUE IOHEXOL 350 MG/ML SOLN  COMPARISON:  05/26/2015; 05/17/2015  FINDINGS: CTA CHEST FINDINGS  Mediastinum/Nodes: Noncontrast images demonstrate no evidence of acute aortic intramural hematoma. Calcifications compatible with old granulomatous disease in the left hilar and infrahilar region, with small left lower lobe granulomas observed.  Systemic arterial phase images demonstrate no evidence of aortic dissection chest. Hypodensity within or along the right upper lobe pulmonary artery on image 36 of series 5 is attributed to  streak artifact from the dense contrast medium in the SVC. No other pulmonary arterial hypodensity is identified.  No pathologic thoracic adenopathy.  There is mild cardiomegaly.  Lungs/Pleura: Calcified granuloma near the minor fissure, image 49 series 6, in the lower part of the right upper lobe.  Bilateral dependent subsegmental atelectasis.  Musculoskeletal: Thoracic spondylosis.  Review of the MIP images confirms the above findings.  CTA ABDOMEN AND PELVIS FINDINGS  Hepatobiliary: Unremarkable  Pancreas: Unremarkable  Spleen: Unremarkable  Adrenals/Urinary Tract: Geographic infarct of the right kidney primarily involving the upper pole, distribution node not changed from prior.  Stomach/Bowel: Descending and sigmoid colon diverticulosis. There is some mild wall thickening throughout much of the colon, query low grade colitis.  Vascular/Lymphatic: There is abnormal luminal hypodensity in the left renal artery severely restricting the caliber of the rib right renal artery as shown on images 55 through 70 of series 8, with patency of a branch extending to the mid kidney and lower pole but thrombosis of the branch extending to the involved segment of the upper pole. This is a fairly similar appearance to the prior exam. 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 The celiac trunk, superior mesenteric artery, inferior mesenteric artery, and left renal artery appear unremarkable. There is very little in the way of atherosclerotic calcification. Pelvic vasculature unremarkable.  Reproductive: Unremarkable  Other: Trace free pelvic fluid, significance uncertain.  Musculoskeletal: Lumbar spondylosis and degenerative disc disease causing mild right foraminal impingement at L4-5 and mild left foraminal impingement at L4-5 and L5-S1. Disc bulge and spurring appear to cause mild central narrowing of the thecal sac at the L1-2 and L2-3  levels. Multilevel Schmorl's nodes.  Review of the MIP images confirms the above findings.  IMPRESSION: 1. Similar distribution of renal infarct in the right kidney upper pole in right mid kidney, with irregular luminal narrowing and suspected dissection in the right renal artery, including the right kidney upper pole branch, but narrowing the main renal  artery proximal to the lower pole branch. This is the only dissection observed; no aortic dissection or other vascular dissection in the chest, abdomen, or pelvis is seen. This could warrant intervention to prevent further occlusion of the right renal artery, correlate with prior disposition from 05/26/2015 with regard to vascular surgery or interventional radiology assessment. 2. Old granulomatous disease. 3. Descending and sigmoid colon diverticulosis. Possible low grade colitis given the slight diffuse colon wall thickening. 4. Lumbar spondylosis and degenerative disc disease causing mild multilevel impingement.   Electronically Signed   By: Van Clines M.D.   On: 05/30/2015 19:48    Micro Results   none  No results found for this or any previous visit (from the past 240 hour(s)).     Today   Subjective:   Juanda Bond today has no headache,no chest abdominal pain,no new weakness tingling or numbness, feels much better wants to go home today.   Objective:   Blood pressure 136/87, pulse 67, temperature 98 F (36.7 C), temperature source Oral, resp. rate 18, height 6\' 1"  (1.854 m), weight 86.183 kg (190 lb), SpO2 95 %.   Intake/Output Summary (Last 24 hours) at 06/01/15 0722 Last data filed at 06/01/15 0443  Gross per 24 hour  Intake      3 ml  Output    350 ml  Net   -347 ml    Exam Awake Alert, Oriented x 3, No new F.N deficits, Normal affect [place].AT,PERRAL Supple Neck,No JVD, No cervical lymphadenopathy appriciated.  Symmetrical Chest wall movement, Good air movement bilaterally, CTAB RRR,No Gallops,Rubs or new Murmurs, No  Parasternal Heave +ve B.Sounds, Abd Soft, Non tender, No organomegaly appriciated, No rebound -guarding or rigidity. No Cyanosis, Clubbing or edema, No new Rash or bruise  Data Review   CBC w Diff: Lab Results  Component Value Date   WBC 9.6 06/01/2015   HGB 14.7 06/01/2015   HCT 42.6 06/01/2015   PLT 245 06/01/2015   LYMPHOPCT 14 06/01/2015   MONOPCT 12 06/01/2015   EOSPCT 2 06/01/2015   BASOPCT 3 06/01/2015    CMP: Lab Results  Component Value Date   NA 137 06/01/2015   K 3.8 06/01/2015   CL 99* 06/01/2015   CO2 31 06/01/2015   BUN 24* 06/01/2015   CREATININE 1.53* 06/01/2015   PROT 7.0 05/26/2015   ALBUMIN 4.2 05/26/2015   BILITOT 0.7 05/26/2015   ALKPHOS 67 05/26/2015   AST 20 05/26/2015   ALT 23 05/26/2015  .   Total Time in preparing paper work, data evaluation and todays exam - 64 minutes  Adelis Docter D M.D on 06/01/2015 at 7:22 AM

## 2015-06-01 NOTE — Progress Notes (Signed)
[facility] Cardiology  SUBJECTIVE: Don't have chest pain   Filed Vitals:   05/31/15 0459 05/31/15 1228 05/31/15 2101 06/01/15 0441  BP: 143/84 135/77 143/84 136/87  Pulse: 78 82 68 67  Temp:  98.1 F (36.7 C) 98.1 F (36.7 C) 98 F (36.7 C)  TempSrc:  Oral Oral Oral  Resp: 19 18 18 18   Height:      Weight:      SpO2: 97% 96% 96% 95%     Intake/Output Summary (Last 24 hours) at 06/01/15 0847 Last data filed at 06/01/15 0443  Gross per 24 hour  Intake      3 ml  Output    350 ml  Net   -347 ml      PHYSICAL EXAM  General: Well developed, well nourished, in no acute distress HEENT:  Normocephalic and atramatic Neck:  No JVD.  Lungs: Clear bilaterally to auscultation and percussion. Heart: HRRR . Normal S1 and S2 without gallops or murmurs.  Abdomen: Bowel sounds are positive, abdomen soft and non-tender  Msk:  Back normal, normal gait. Normal strength and tone for age. Extremities: No clubbing, cyanosis or edema.   Neuro: Alert and oriented X 3. Psych:  Good affect, responds appropriately   LABS: Basic Metabolic Panel:  Recent Labs  05/31/15 0413 06/01/15 0431  NA 136 137  K 4.1 3.8  CL 96* 99*  CO2 31 31  GLUCOSE 138* 112*  BUN 15 24*  CREATININE 1.50* 1.53*  CALCIUM 9.2 9.1   Liver Function Tests: No results for input(s): AST, ALT, ALKPHOS, BILITOT, PROT, ALBUMIN in the last 72 hours.  Recent Labs  05/30/15 1659  LIPASE 24   CBC:  Recent Labs  05/31/15 0413 06/01/15 0431  WBC 11.8* 9.6  NEUTROABS  --  6.7*  HGB 14.0 14.7  HCT 40.8 42.6  MCV 91.9 92.0  PLT 244 245   Cardiac Enzymes:  Recent Labs  05/31/15 0413 05/31/15 0957 05/31/15 1520  TROPONINI <0.03 <0.03 <0.03   BNP: Invalid input(s): POCBNP D-Dimer: No results for input(s): DDIMER in the last 72 hours. Hemoglobin A1C: No results for input(s): HGBA1C in the last 72 hours. Fasting Lipid Panel: No results for input(s): CHOL, HDL, LDLCALC, TRIG, CHOLHDL, LDLDIRECT in the last  72 hours. Thyroid Function Tests: No results for input(s): TSH, T4TOTAL, T3FREE, THYROIDAB in the last 72 hours.  Invalid input(s): FREET3 Anemia Panel: No results for input(s): VITAMINB12, FOLATE, FERRITIN, TIBC, IRON, RETICCTPCT in the last 72 hours.  Dg Chest 2 View  05/30/2015   CLINICAL DATA:  Left side chest pain for 3 days  EXAM: CHEST  2 VIEW  COMPARISON:  05/17/2015  FINDINGS: Cardiomediastinal silhouette is stable. No acute infiltrate or pleural effusion. No pulmonary edema. Bony thorax is unremarkable.  IMPRESSION: No active cardiopulmonary disease.   Electronically Signed   By: Lahoma Crocker M.D.   On: 05/30/2015 18:33   Ct Angio Chest Aorta W/cm &/or Wo/cm  05/30/2015   CLINICAL DATA:  Mid chest pain onset 12 p.m. Recent admission for renal infarct. Clinical concern for aortic dissection.  EXAM: CT ANGIOGRAPHY CHEST, ABDOMEN AND PELVIS  TECHNIQUE: Multidetector CT imaging through the chest, abdomen and pelvis was performed using the standard protocol during bolus administration of intravenous contrast. Multiplanar reconstructed images and MIPs were obtained and reviewed to evaluate the vascular anatomy.  CONTRAST:  168mL OMNIPAQUE IOHEXOL 350 MG/ML SOLN  COMPARISON:  05/26/2015; 05/17/2015  FINDINGS: CTA CHEST FINDINGS  Mediastinum/Nodes: Noncontrast images demonstrate no  evidence of acute aortic intramural hematoma. Calcifications compatible with old granulomatous disease in the left hilar and infrahilar region, with small left lower lobe granulomas observed.  Systemic arterial phase images demonstrate no evidence of aortic dissection chest. Hypodensity within or along the right upper lobe pulmonary artery on image 36 of series 5 is attributed to streak artifact from the dense contrast medium in the SVC. No other pulmonary arterial hypodensity is identified.  No pathologic thoracic adenopathy.  There is mild cardiomegaly.  Lungs/Pleura: Calcified granuloma near the minor fissure, image 49  series 6, in the lower part of the right upper lobe.  Bilateral dependent subsegmental atelectasis.  Musculoskeletal: Thoracic spondylosis.  Review of the MIP images confirms the above findings.  CTA ABDOMEN AND PELVIS FINDINGS  Hepatobiliary: Unremarkable  Pancreas: Unremarkable  Spleen: Unremarkable  Adrenals/Urinary Tract: Geographic infarct of the right kidney primarily involving the upper pole, distribution node not changed from prior.  Stomach/Bowel: Descending and sigmoid colon diverticulosis. There is some mild wall thickening throughout much of the colon, query low grade colitis.  Vascular/Lymphatic: There is abnormal luminal hypodensity in the left renal artery severely restricting the caliber of the rib right renal artery as shown on images 55 through 70 of series 8, with patency of a branch extending to the mid kidney and lower pole but thrombosis of the branch extending to the involved segment of the upper pole. This is a fairly similar appearance to the prior exam. 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 The celiac trunk, superior mesenteric artery, inferior mesenteric artery, and left renal artery appear unremarkable. There is very little in the way of atherosclerotic calcification. Pelvic vasculature unremarkable.  Reproductive: Unremarkable  Other: Trace free pelvic fluid, significance uncertain.  Musculoskeletal: Lumbar spondylosis and degenerative disc disease causing mild right foraminal impingement at L4-5 and mild left foraminal impingement at L4-5 and L5-S1. Disc bulge and spurring appear to cause mild central narrowing of the thecal sac at the L1-2 and L2-3 levels. Multilevel Schmorl's nodes.  Review of the MIP images confirms the above findings.  IMPRESSION: 1. Similar distribution of renal infarct in the right kidney upper pole in right mid kidney, with irregular luminal narrowing and suspected  dissection in the right renal artery, including the right kidney upper pole branch, but narrowing the main renal artery proximal to the lower pole branch. This is the only dissection observed; no aortic dissection or other vascular dissection in the chest, abdomen, or pelvis is seen. This could warrant intervention to prevent further occlusion of the right renal artery, correlate with prior disposition from 05/26/2015 with regard to vascular surgery or interventional radiology assessment. 2. Old granulomatous disease. 3. Descending and sigmoid colon diverticulosis. Possible low grade colitis given the slight diffuse colon wall thickening. 4. Lumbar spondylosis and degenerative disc disease causing mild multilevel impingement.   Electronically Signed   By: Van Clines M.D.   On: 05/30/2015 19:48   Ct Cta Abd/pel W/cm &/or W/o Cm  05/30/2015   CLINICAL DATA:  Mid chest pain onset 12 p.m. Recent admission for renal infarct. Clinical concern for aortic dissection.  EXAM: CT ANGIOGRAPHY CHEST, ABDOMEN AND PELVIS  TECHNIQUE: Multidetector CT imaging through the chest, abdomen and pelvis was performed using the standard protocol during bolus administration of intravenous contrast. Multiplanar reconstructed images and MIPs were obtained and reviewed to evaluate the vascular anatomy.  CONTRAST:  132mL OMNIPAQUE  IOHEXOL 350 MG/ML SOLN  COMPARISON:  05/26/2015; 05/17/2015  FINDINGS: CTA CHEST FINDINGS  Mediastinum/Nodes: Noncontrast images demonstrate no evidence of acute aortic intramural hematoma. Calcifications compatible with old granulomatous disease in the left hilar and infrahilar region, with small left lower lobe granulomas observed.  Systemic arterial phase images demonstrate no evidence of aortic dissection chest. Hypodensity within or along the right upper lobe pulmonary artery on image 36 of series 5 is attributed to streak artifact from the dense contrast medium in the SVC. No other pulmonary arterial  hypodensity is identified.  No pathologic thoracic adenopathy.  There is mild cardiomegaly.  Lungs/Pleura: Calcified granuloma near the minor fissure, image 49 series 6, in the lower part of the right upper lobe.  Bilateral dependent subsegmental atelectasis.  Musculoskeletal: Thoracic spondylosis.  Review of the MIP images confirms the above findings.  CTA ABDOMEN AND PELVIS FINDINGS  Hepatobiliary: Unremarkable  Pancreas: Unremarkable  Spleen: Unremarkable  Adrenals/Urinary Tract: Geographic infarct of the right kidney primarily involving the upper pole, distribution node not changed from prior.  Stomach/Bowel: Descending and sigmoid colon diverticulosis. There is some mild wall thickening throughout much of the colon, query low grade colitis.  Vascular/Lymphatic: There is abnormal luminal hypodensity in the left renal artery severely restricting the caliber of the rib right renal artery as shown on images 55 through 70 of series 8, with patency of a branch extending to the mid kidney and lower pole but thrombosis of the branch extending to the involved segment of the upper pole. This is a fairly similar appearance to the prior exam. 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 The celiac trunk, superior mesenteric artery, inferior mesenteric artery, and left renal artery appear unremarkable. There is very little in the way of atherosclerotic calcification. Pelvic vasculature unremarkable.  Reproductive: Unremarkable  Other: Trace free pelvic fluid, significance uncertain.  Musculoskeletal: Lumbar spondylosis and degenerative disc disease causing mild right foraminal impingement at L4-5 and mild left foraminal impingement at L4-5 and L5-S1. Disc bulge and spurring appear to cause mild central narrowing of the thecal sac at the L1-2 and L2-3 levels. Multilevel Schmorl's nodes.  Review of the MIP images confirms the above  findings.  IMPRESSION: 1. 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 This is the only dissection observed; no aortic dissection or other vascular dissection in the chest, abdomen, or pelvis is seen. This could warrant intervention to prevent further occlusion of the right renal artery, correlate with prior disposition from 05/26/2015 with regard to vascular surgery or interventional radiology assessment. 2. Old granulomatous disease. 3. Descending and sigmoid colon diverticulosis. Possible low grade colitis given the slight diffuse colon wall thickening. 4. Lumbar spondylosis and degenerative disc disease causing mild multilevel impingement.   Electronically Signed   By: Van Clines M.D.   On: 05/30/2015 19:48     Echo normal left ventricular function, LVEF 60-65%, no evidence for intracardiac thrombus  TELEMETRY: Normal sinus rhythm:  ASSESSMENT AND PLAN:  Active Problems:   Chest pain    1. Chest pain, pleuritic in nature, resolved, negative troponin 2. Right renal infarct, spontaneous renal artery dissection with thrombus, on Eliquis  Recommendations  1. Continue current  meds 2. Agree with more aggressive control of elevated blood pressure with metoprolol and amlodipine 3. Defer cardiac catheterization 4. Consider TEE as outpatient 5. Consider workup of possible hypercoagulable state as outpatient   Isaias Cowman, MD, PhD, [facility] 06/01/2015 8:47 AM

## 2015-09-05 ENCOUNTER — Other Ambulatory Visit: Payer: Self-pay | Admitting: Internal Medicine

## 2015-09-22 DIAGNOSIS — N280 Ischemia and infarction of kidney: Secondary | ICD-10-CM | POA: Diagnosis not present

## 2015-09-22 DIAGNOSIS — I10 Essential (primary) hypertension: Secondary | ICD-10-CM | POA: Diagnosis not present

## 2015-09-22 DIAGNOSIS — N289 Disorder of kidney and ureter, unspecified: Secondary | ICD-10-CM | POA: Diagnosis not present

## 2015-10-18 DIAGNOSIS — I10 Essential (primary) hypertension: Secondary | ICD-10-CM | POA: Diagnosis not present

## 2015-10-18 DIAGNOSIS — N289 Disorder of kidney and ureter, unspecified: Secondary | ICD-10-CM | POA: Diagnosis not present

## 2015-12-12 DIAGNOSIS — N280 Ischemia and infarction of kidney: Secondary | ICD-10-CM | POA: Diagnosis not present

## 2015-12-12 DIAGNOSIS — M549 Dorsalgia, unspecified: Secondary | ICD-10-CM | POA: Diagnosis not present

## 2015-12-12 DIAGNOSIS — I10 Essential (primary) hypertension: Secondary | ICD-10-CM | POA: Diagnosis not present

## 2015-12-12 DIAGNOSIS — I150 Renovascular hypertension: Secondary | ICD-10-CM | POA: Diagnosis not present

## 2016-04-30 DIAGNOSIS — Z79899 Other long term (current) drug therapy: Secondary | ICD-10-CM | POA: Diagnosis not present

## 2016-04-30 DIAGNOSIS — M791 Myalgia: Secondary | ICD-10-CM | POA: Diagnosis not present

## 2016-08-09 IMAGING — CT CT ANGIO CHEST
3 of 11 series · 19 of 38 positions shown · IV contrast (APPLIED)
Comparison: CT abdomen/ pelvis 05/16/2015.

CLINICAL DATA: Left anterior chest pain since this morning. Patient
admitted for abdominal pain due to right renal infarct. Abdominal
pain improved on heparin drip.

EXAM:
CT ANGIOGRAPHY CHEST WITH CONTRAST
TECHNIQUE: Multidetector CT imaging of the chest was performed using the
standard protocol during bolus administration of intravenous
contrast. Multiplanar CT image reconstructions and MIPs were
obtained to evaluate the vascular anatomy.
CONTRAST:  75mL OMNIPAQUE IOHEXOL 350 MG/ML SOLN

[Series 2: routine chest wo · axial · 0.76mm/px · z∈[-716,-526]mm · 3 of 77 slices shown]
[im 20/77  lung]
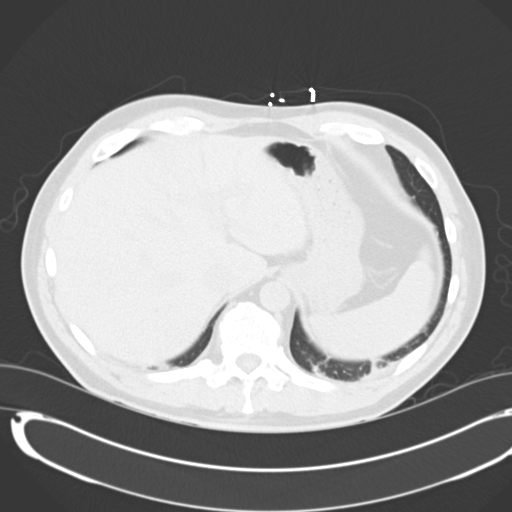
[im 39/77  mediastinal]
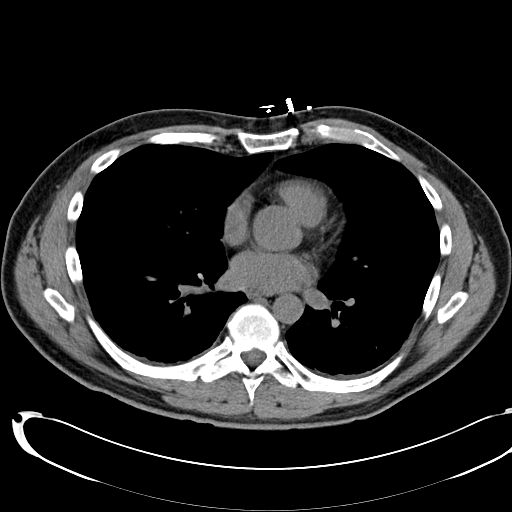
[im 58/77  lung]
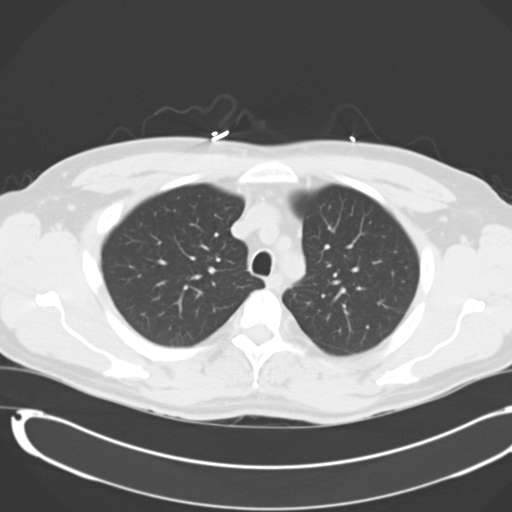

[Series 6: arterial · axial · arterial · 0.77mm/px · z∈[-776,-468]mm · 8 of 190 slices shown (1 of 2)]
[im 18/190  lung]
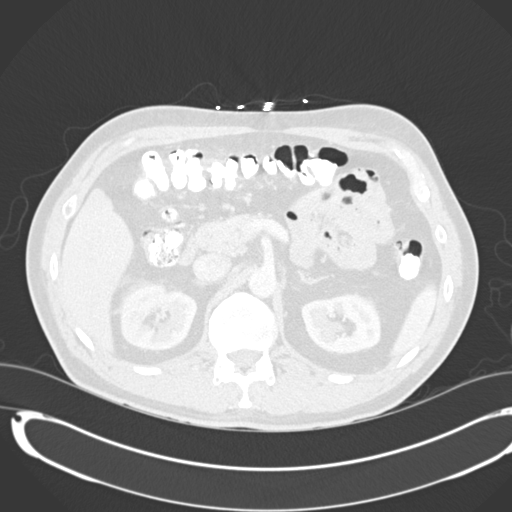
[im 35/190  lung]
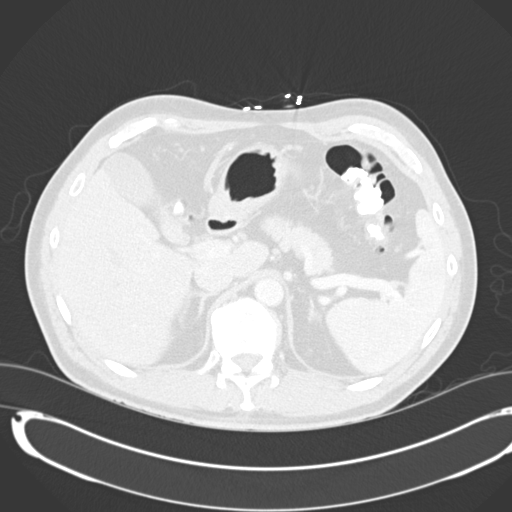
[im 69/190  lung]
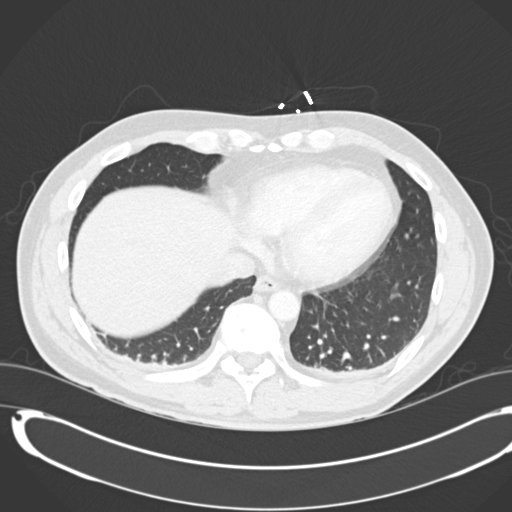
[im 86/190  lung]
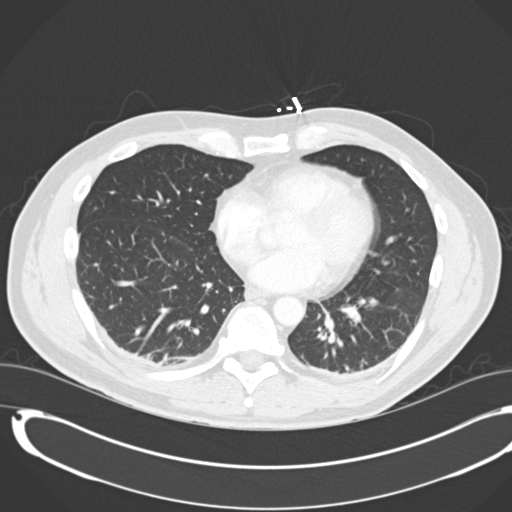
[im 104/190  lung]
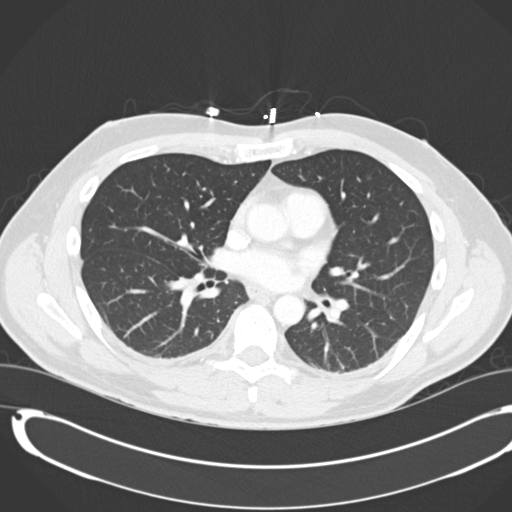
[im 121/190  lung]
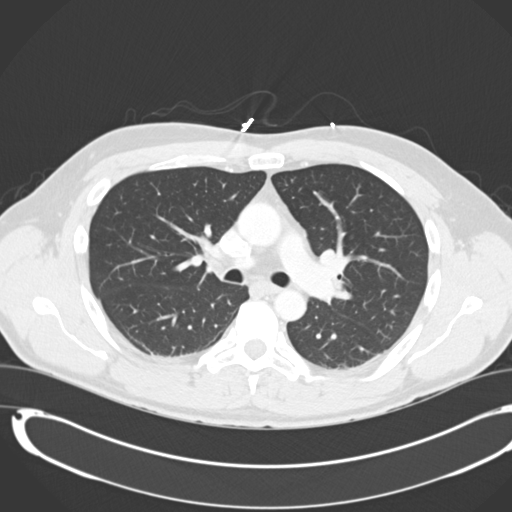
[im 155/190  lung]
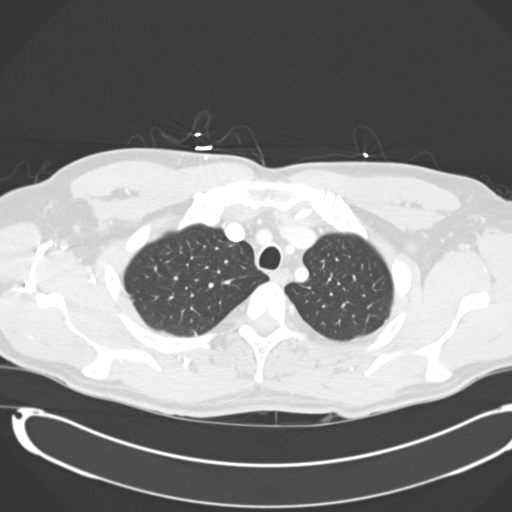
[im 172/190  lung]
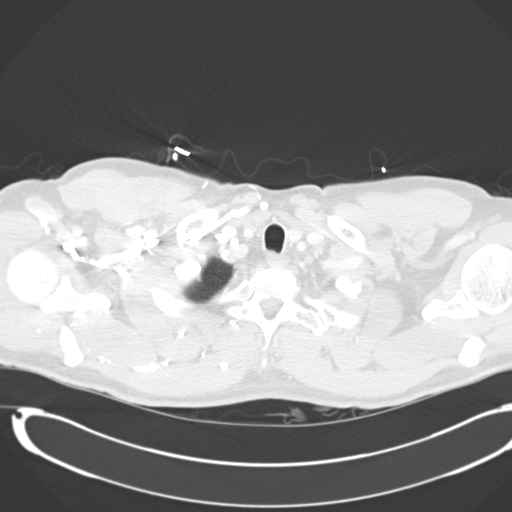

[Series 14: arterial · axial · arterial · 0.75mm/px · z∈[-740,-468]mm · 8 of 172 slices shown (2 of 2)]
[im 18/172  lung]
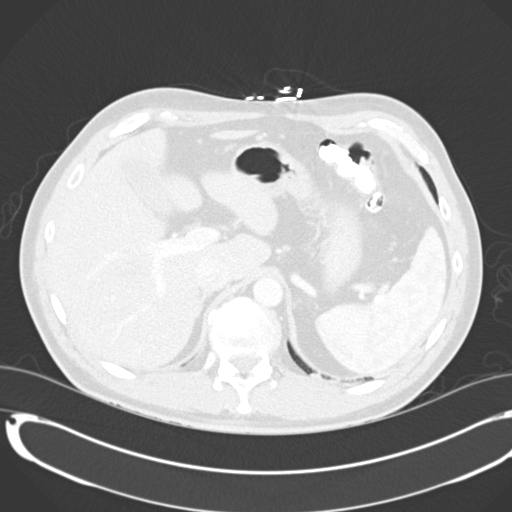
[im 35/172  lung]
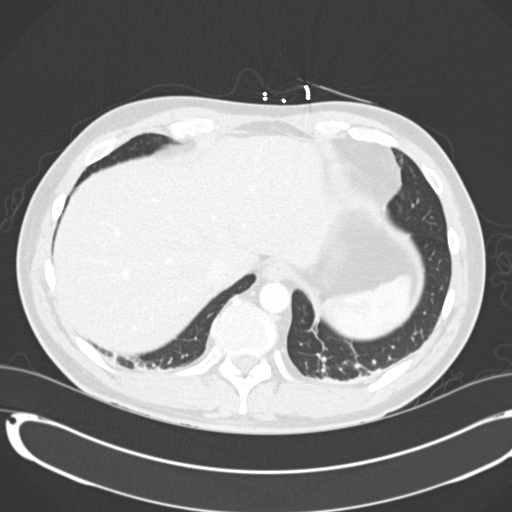
[im 52/172  lung]
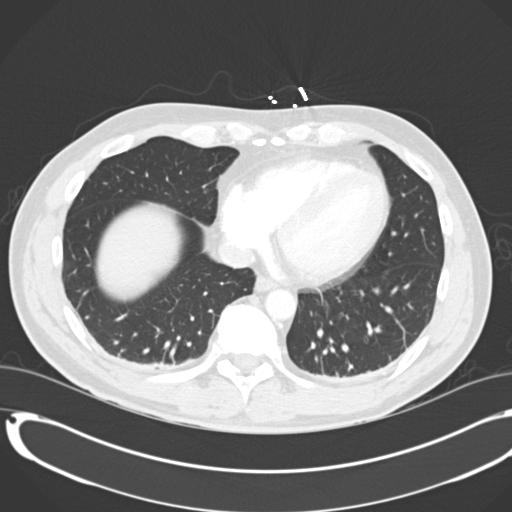
[im 69/172  lung]
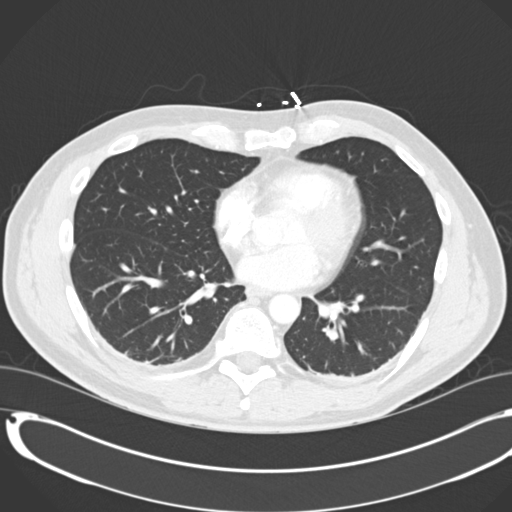
[im 103/172  lung]
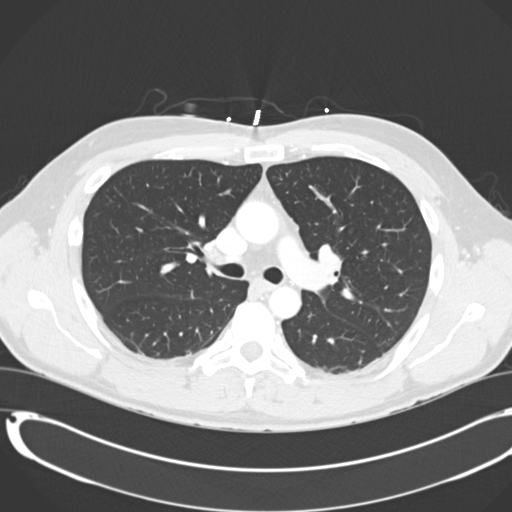
[im 120/172  lung]
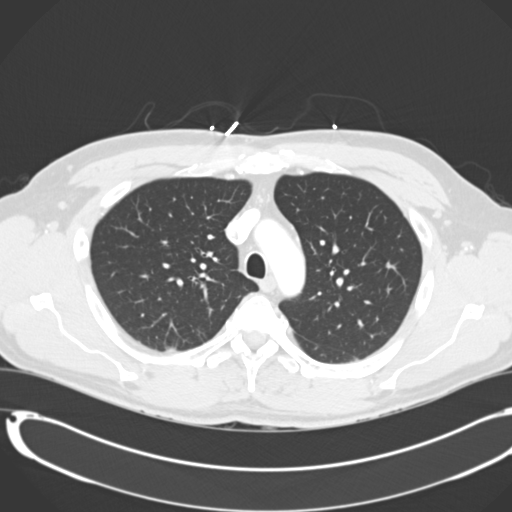
[im 137/172  lung]
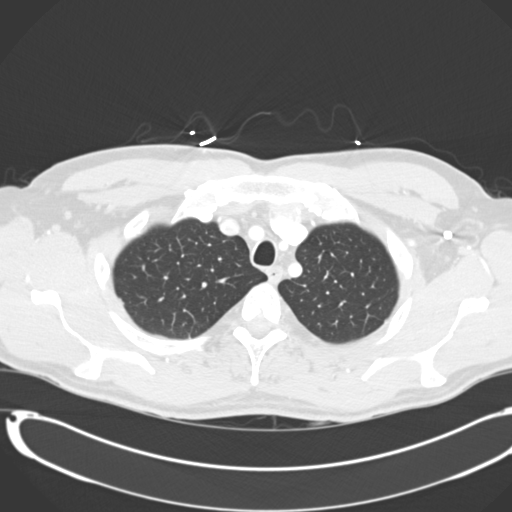
[im 154/172  lung]
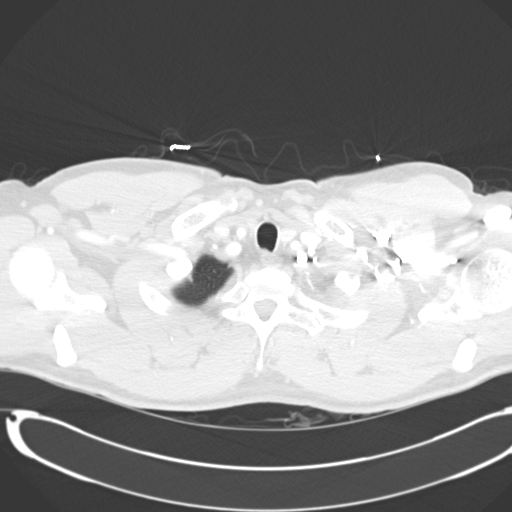

[19 of 38 positions shown; findings below may reference images not displayed]

FINDINGS: Lungs are adequately inflated without consolidation or effusion.
There is subtle bibasilar dependent atelectasis. Tiny calcification
along the left major fissure and adjacent the right minor fissure as
well as focal sub cm pleural-based calcification over the medial
left lung base. Airways are normal.

Heart is normal in size. There is no evidence of pulmonary emboli.
Thoracic aorta is normal in caliber without evidence of dissection
or aneurysm. Takeoff of the great vessels is normal. There are a
couple small calcified mediastinal left hilar lymph nodes. Remaining
mediastinal structures are within normal.

Images through the upper abdomen again demonstrate low-attenuation
over the upper pole cortex of the right kidney thought to represent
infarct. Minimal stranding of the perirenal fat anterior to the
right mid to upper pole. The remainder of the exam is not
significantly changed.

Review of the MIP images confirms the above findings.
IMPRESSION: No evidence pulmonary embolism. No evidence of thoracic aortic
dissection or aneurysm.

No acute cardiopulmonary disease. Evidence of prior granulomas
disease.

Stable changes over the upper pole of the right kidney thought to
represent infarction and less likely pyelonephritis.

## 2016-09-24 DIAGNOSIS — E782 Mixed hyperlipidemia: Secondary | ICD-10-CM | POA: Diagnosis not present

## 2016-09-24 DIAGNOSIS — N280 Ischemia and infarction of kidney: Secondary | ICD-10-CM | POA: Diagnosis not present

## 2016-09-24 DIAGNOSIS — I10 Essential (primary) hypertension: Secondary | ICD-10-CM | POA: Diagnosis not present

## 2016-09-25 DIAGNOSIS — J029 Acute pharyngitis, unspecified: Secondary | ICD-10-CM | POA: Diagnosis not present

## 2016-09-25 DIAGNOSIS — Z79899 Other long term (current) drug therapy: Secondary | ICD-10-CM | POA: Diagnosis not present

## 2016-09-25 DIAGNOSIS — Z125 Encounter for screening for malignant neoplasm of prostate: Secondary | ICD-10-CM | POA: Diagnosis not present

## 2016-09-25 DIAGNOSIS — I10 Essential (primary) hypertension: Secondary | ICD-10-CM | POA: Diagnosis not present

## 2016-09-25 DIAGNOSIS — N280 Ischemia and infarction of kidney: Secondary | ICD-10-CM | POA: Diagnosis not present

## 2016-09-25 DIAGNOSIS — E782 Mixed hyperlipidemia: Secondary | ICD-10-CM | POA: Diagnosis not present

## 2016-12-10 ENCOUNTER — Other Ambulatory Visit (INDEPENDENT_AMBULATORY_CARE_PROVIDER_SITE_OTHER): Payer: Self-pay | Admitting: Vascular Surgery

## 2016-12-10 DIAGNOSIS — I7773 Dissection of renal artery: Secondary | ICD-10-CM

## 2016-12-10 DIAGNOSIS — I10 Essential (primary) hypertension: Secondary | ICD-10-CM

## 2016-12-10 DIAGNOSIS — N280 Ischemia and infarction of kidney: Secondary | ICD-10-CM

## 2016-12-13 ENCOUNTER — Ambulatory Visit (INDEPENDENT_AMBULATORY_CARE_PROVIDER_SITE_OTHER): Payer: 59

## 2016-12-13 ENCOUNTER — Encounter (INDEPENDENT_AMBULATORY_CARE_PROVIDER_SITE_OTHER): Payer: Self-pay | Admitting: Vascular Surgery

## 2016-12-13 ENCOUNTER — Ambulatory Visit (INDEPENDENT_AMBULATORY_CARE_PROVIDER_SITE_OTHER): Payer: 59 | Admitting: Vascular Surgery

## 2016-12-13 VITALS — BP 123/73 | HR 65 | Resp 16 | Ht 73.0 in | Wt 208.0 lb

## 2016-12-13 DIAGNOSIS — I10 Essential (primary) hypertension: Secondary | ICD-10-CM

## 2016-12-13 DIAGNOSIS — I7773 Dissection of renal artery: Secondary | ICD-10-CM | POA: Diagnosis not present

## 2016-12-13 DIAGNOSIS — N280 Ischemia and infarction of kidney: Secondary | ICD-10-CM

## 2016-12-17 DIAGNOSIS — I10 Essential (primary) hypertension: Secondary | ICD-10-CM | POA: Insufficient documentation

## 2016-12-17 NOTE — Progress Notes (Signed)
MRN : [id number]  Johnny Robinson is a 51 y.o. ([birth year]) male who presents with chief complaint of  Chief Complaint  Patient presents with  . Re-evaluation    1 year renal ultrasound  .  History of Present Illness: The patient returns to the office for followup and review of the noninvasive studies regarding renal artery dissection. There have been no interval changes in the patient's blood pressure control.  He denies any major changes in is medications.  The patient denies headache or flushing.  No flank or unusual back pain.    There have been no significant changes to the patient's overall health care.  The patient denies amaurosis fugax or recent TIA symptoms. There are no recent neurological changes noted. The patient denies history of DVT, PE or superficial thrombophlebitis. The patient denies recent episodes of angina or shortness of breath.   Duplex ultrasound of the renal arteries shows a widely patent renal arteries with normal size kidneys  Current Meds  Medication Sig  . amLODipine (NORVASC) 5 MG tablet Take 1 tablet (5 mg total) by mouth daily.  Marland Kitchen apixaban (ELIQUIS) 5 MG TABS tablet Take 1 tablet (5 mg total) by mouth 2 (two) times daily.  Marland Kitchen aspirin EC 81 MG EC tablet Take 1 tablet (81 mg total) by mouth daily.  Marland Kitchen atorvastatin (LIPITOR) 20 MG tablet Take 1 tablet (20 mg total) by mouth daily at 6 PM.  . HYDROcodone-acetaminophen (NORCO) 10-325 MG per tablet Take 1 tablet by mouth every 4 (four) hours as needed for moderate pain.  . metoprolol succinate (TOPROL-XL) 25 MG 24 hr tablet   . metoprolol tartrate (LOPRESSOR) 25 MG tablet Take 1 tablet (25 mg total) by mouth 2 (two) times daily.  . naproxen (NAPROSYN) 500 MG tablet Take 1 tablet (500 mg total) by mouth 2 (two) times daily with a meal.  . nitroGLYCERIN (NITROSTAT) 0.4 MG SL tablet Place 1 tablet (0.4 mg total) under the tongue every 5 (five) minutes as needed for chest pain.  Marland Kitchen omeprazole (PRILOSEC) 40 MG capsule  TAKE ONE CAPSULE BY MOUTH EVERY MORNING  . ondansetron (ZOFRAN) 4 MG tablet Take 4 mg by mouth every 8 (eight) hours as needed for nausea or vomiting.    Past Medical History:  Diagnosis Date  . GERD (gastroesophageal reflux disease)   . Reflux     Past Surgical History:  Procedure Laterality Date  . BACK SURGERY    . PERIPHERAL VASCULAR CATHETERIZATION Right 05/18/2015   Procedure: Renal Angiography;  Surgeon: Algernon Huxley, MD;  Location: [place] [facility];  Service: Cardiovascular;  Laterality: Right;  . PERIPHERAL VASCULAR CATHETERIZATION Bilateral 05/18/2015   Procedure: Renal Intervention;  Surgeon: Algernon Huxley, MD;  Location: [place] [facility];  Service: Cardiovascular;  Laterality: Bilateral;    Social History Social History  Substance Use Topics  . Smoking status: Never Smoker  . Smokeless tobacco: Never Used  . Alcohol use Yes     Comment: occasional    Family History Family History  Problem Relation Age of Onset  . Nephrolithiasis Mother   . Cancer Father   No family history of bleeding/clotting disorders, porphyria or autoimmune disease   No Known Allergies   REVIEW OF SYSTEMS (Negative unless checked)  Constitutional: [] Weight loss  [] Fever  [] Chills Cardiac: [] Chest pain   [] Chest pressure   [] Palpitations   [] Shortness of breath when laying flat   [] Shortness of breath with exertion. Vascular:  [] Pain in legs  with walking   [] Pain in legs at rest  [] History of DVT   [] Phlebitis   [] Swelling in legs   [] Varicose veins   [] Non-healing ulcers Pulmonary:   [] Uses home oxygen   [] Productive cough   [] Hemoptysis   [] Wheeze  [] COPD   [] Asthma Neurologic:  [] Dizziness   [] Seizures   [] History of stroke   [] History of TIA  [] Aphasia   [] Vissual changes   [] Weakness or numbness in arm   [] Weakness or numbness in leg Musculoskeletal:   [] Joint swelling   [] Joint pain   [] Low back pain Hematologic:  [] Easy bruising  [] Easy bleeding   [] Hypercoagulable state    [] Anemic Gastrointestinal:  [] Diarrhea   [] Vomiting  [] Gastroesophageal reflux/heartburn   [] Difficulty swallowing. Genitourinary:  [] Chronic kidney disease   [] Difficult urination  [] Frequent urination   [] Blood in urine Skin:  [] Rashes   [] Ulcers  Psychological:  [] History of anxiety   []  History of major depression.  Physical Examination  Vitals:   12/13/16 0834  BP: 123/73  Pulse: 65  Resp: 16  Weight: 208 lb (94.3 kg)  Height: 6\' 1"  (1.854 m)   Body mass index is 27.44 kg/m. Gen: WD/WN, NAD Head: [place]/AT, No temporalis wasting.  Ear/Nose/Throat: Hearing grossly intact, nares w/o erythema or drainage, poor dentition Eyes: PER, EOMI, sclera nonicteric.  Neck: Supple, no masses.  No bruit or JVD.  Pulmonary:  Good air movement, clear to auscultation bilaterally, no use of accessory muscles.  Cardiac: RRR, normal S1, S2, no Murmurs. Vascular:  Vessel Right Left  Radial Palpable Palpable  Ulnar Palpable Palpable  Gastrointestinal: soft, non-distended. No guarding/no peritoneal signs.  Musculoskeletal: M/S 5/5 throughout.  No deformity or atrophy.  Neurologic: CN 2-12 intact. Pain and light touch intact in extremities.  Symmetrical.  Speech is fluent. Motor exam as listed above. Psychiatric: Judgment intact, Mood & affect appropriate for pt's clinical situation. Dermatologic: No rashes or ulcers noted.  No changes consistent with cellulitis. Lymph : No Cervical lymphadenopathy, no lichenification or skin changes of chronic lymphedema.  CBC Lab Results  Component Value Date   WBC 9.6 06/01/2015   HGB 14.7 06/01/2015   HCT 42.6 06/01/2015   MCV 92.0 06/01/2015   PLT 245 06/01/2015    BMET    Component Value Date/Time   NA 137 06/01/2015 0431   K 3.8 06/01/2015 0431   CL 99 (L) 06/01/2015 0431   CO2 31 06/01/2015 0431   GLUCOSE 112 (H) 06/01/2015 0431   BUN 24 (H) 06/01/2015 0431   CREATININE 1.53 (H) 06/01/2015 0431   CALCIUM 9.1 06/01/2015 0431   GFRNONAA 53 (L)  06/01/2015 0431   GFRAA >60 06/01/2015 0431   CrCl cannot be calculated (Patient's most recent lab result is older than the maximum 21 days allowed.).  COAG Lab Results  Component Value Date   INR 1.00 05/26/2015   INR 0.98 05/16/2015    Radiology No results found.  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  BP is acceptable today  The patient's vital signs and noninvasive studies support the renal artery dissection is not significantly changed when compared to the previous study.  No invasive studies or intervention is indicated at this time.  The patient will continue the current antihypertensive medications, no changes at this time.  The primary medical service will continue aggressive antihypertensive therapy as per the AHA guidelines     Hortencia Pilar, MD  12/17/2016  8:43 PM

## 2017-03-01 ENCOUNTER — Other Ambulatory Visit: Payer: Self-pay | Admitting: Internal Medicine

## 2017-03-01 DIAGNOSIS — M5136 Other intervertebral disc degeneration, lumbar region: Secondary | ICD-10-CM

## 2017-03-01 DIAGNOSIS — M51369 Other intervertebral disc degeneration, lumbar region without mention of lumbar back pain or lower extremity pain: Secondary | ICD-10-CM

## 2017-03-05 ENCOUNTER — Ambulatory Visit
Admission: RE | Admit: 2017-03-05 | Discharge: 2017-03-05 | Disposition: A | Discharge status: Home / Self Care | Payer: 59 | Source: Ambulatory Visit | Attending: Internal Medicine | Admitting: Internal Medicine

## 2017-03-05 DIAGNOSIS — M545 Low back pain: Secondary | ICD-10-CM | POA: Diagnosis not present

## 2017-03-05 DIAGNOSIS — M5136 Other intervertebral disc degeneration, lumbar region: Secondary | ICD-10-CM | POA: Insufficient documentation

## 2017-03-13 DIAGNOSIS — M5416 Radiculopathy, lumbar region: Secondary | ICD-10-CM | POA: Diagnosis not present

## 2017-04-01 DIAGNOSIS — M5126 Other intervertebral disc displacement, lumbar region: Secondary | ICD-10-CM | POA: Diagnosis not present

## 2017-04-01 DIAGNOSIS — M5416 Radiculopathy, lumbar region: Secondary | ICD-10-CM | POA: Diagnosis not present

## 2017-04-01 DIAGNOSIS — M48061 Spinal stenosis, lumbar region without neurogenic claudication: Secondary | ICD-10-CM | POA: Diagnosis not present

## 2017-04-01 DIAGNOSIS — Z6826 Body mass index (BMI) 26.0-26.9, adult: Secondary | ICD-10-CM | POA: Diagnosis not present

## 2017-08-13 MED FILL — CYCLOBENZAPRINE 10 MG TAB: 10 | 30 days supply | Qty: 90 | Fill #0

## 2017-09-11 MED FILL — LOSARTAN POTASSIUM 100 MG T: 100 | 90 days supply | Qty: 90 | Fill #0

## 2017-09-11 MED FILL — METOPROLOL SUCC ER 25 MG TA: 25 | 30 days supply | Qty: 30 | Fill #0

## 2017-09-11 MED FILL — OMEPRAZOLE DR 40 MG CAPSULE: 40 | 90 days supply | Qty: 90 | Fill #0

## 2017-10-28 MED FILL — METOPROLOL SUCCINATE ER 25: 25 | 30 days supply | Qty: 30 | Fill #1

## 2017-11-12 MED FILL — ATORVASTATIN 20 MG TABLET: 20 | 30 days supply | Qty: 30 | Fill #0

## 2017-11-12 MED FILL — CYCLOBENZAPRINE 10 MG TAB: 10 | 30 days supply | Qty: 90 | Fill #1

## 2017-11-15 HISTORY — PX: BACK SURGERY: SHX140

## 2017-11-21 MED FILL — METOPROLOL SUCCINATE ER 25: 25 | 30 days supply | Qty: 30 | Fill #0

## 2017-12-06 MED FILL — OMEPRAZOLE DR 40 MG CAPSULE: 40 | 90 days supply | Qty: 90 | Fill #1

## 2017-12-16 ENCOUNTER — Ambulatory Visit (INDEPENDENT_AMBULATORY_CARE_PROVIDER_SITE_OTHER): Payer: 59

## 2017-12-16 ENCOUNTER — Ambulatory Visit (INDEPENDENT_AMBULATORY_CARE_PROVIDER_SITE_OTHER): Payer: 59 | Admitting: Vascular Surgery

## 2017-12-16 ENCOUNTER — Encounter (INDEPENDENT_AMBULATORY_CARE_PROVIDER_SITE_OTHER): Payer: Self-pay | Admitting: Vascular Surgery

## 2017-12-16 VITALS — BP 120/78 | HR 73 | Resp 16 | Ht 72.0 in | Wt 212.4 lb

## 2017-12-16 DIAGNOSIS — E782 Mixed hyperlipidemia: Secondary | ICD-10-CM

## 2017-12-16 DIAGNOSIS — I7773 Dissection of renal artery: Secondary | ICD-10-CM

## 2017-12-16 DIAGNOSIS — N280 Ischemia and infarction of kidney: Secondary | ICD-10-CM | POA: Diagnosis not present

## 2017-12-16 DIAGNOSIS — I10 Essential (primary) hypertension: Secondary | ICD-10-CM | POA: Diagnosis not present

## 2017-12-18 ENCOUNTER — Encounter (INDEPENDENT_AMBULATORY_CARE_PROVIDER_SITE_OTHER): Payer: Self-pay | Admitting: Vascular Surgery

## 2017-12-18 DIAGNOSIS — E785 Hyperlipidemia, unspecified: Secondary | ICD-10-CM | POA: Insufficient documentation

## 2017-12-18 MED FILL — ATORVASTATIN 20 MG TABLET: 20 | 30 days supply | Qty: 30 | Fill #0

## 2017-12-18 NOTE — Progress Notes (Signed)
MRN : [id number]  Johnny Robinson is a 52 y.o. (1966/02/05) male who presents with chief complaint of  Chief Complaint  Patient presents with  . Follow-up    6yr Renal ultrasound  .  History of Present Illness: The patient returns to the office for followup and review of the noninvasive studies regarding renal artery dissection. There have been no interval changes in the patient's blood pressure control, in fact his blood pressure control is excellent and he is well within the American Heart Association guidelines.  He denies any major changes in is medications.  The patient denies headache or flushing.  No flank or unusual back pain.    There have been no significant changes to the patient's overall health care.  The patient denies amaurosis fugax or recent TIA symptoms. There are no recent neurological changes noted. The patient denies history of DVT, PE or superficial thrombophlebitis. The patient denies recent episodes of angina or shortness of breath.   Duplex ultrasound of the renal arteries shows a widely patent renal arteries with normal size kidneys    Current Meds  Medication Sig  . amLODipine (NORVASC) 5 MG tablet Take 1 tablet (5 mg total) by mouth daily.  Marland Kitchen apixaban (ELIQUIS) 5 MG TABS tablet Take 1 tablet (5 mg total) by mouth 2 (two) times daily.  Marland Kitchen aspirin EC 81 MG EC tablet Take 1 tablet (81 mg total) by mouth daily.  Marland Kitchen atorvastatin (LIPITOR) 20 MG tablet Take 1 tablet (20 mg total) by mouth daily at 6 PM.  . metoprolol succinate (TOPROL-XL) 25 MG 24 hr tablet   . metoprolol tartrate (LOPRESSOR) 25 MG tablet Take 1 tablet (25 mg total) by mouth 2 (two) times daily.  . naproxen (NAPROSYN) 500 MG tablet Take 1 tablet (500 mg total) by mouth 2 (two) times daily with a meal.  . nitroGLYCERIN (NITROSTAT) 0.4 MG SL tablet Place 1 tablet (0.4 mg total) under the tongue every 5 (five) minutes as needed for chest pain.  Marland Kitchen omeprazole (PRILOSEC) 40 MG capsule TAKE ONE CAPSULE  BY MOUTH EVERY MORNING  . ondansetron (ZOFRAN) 4 MG tablet Take 4 mg by mouth every 8 (eight) hours as needed for nausea or vomiting.    Past Medical History:  Diagnosis Date  . GERD (gastroesophageal reflux disease)   . Reflux     Past Surgical History:  Procedure Laterality Date  . BACK SURGERY    . PERIPHERAL VASCULAR CATHETERIZATION Right 05/18/2015   Procedure: Renal Angiography;  Surgeon: Algernon Huxley, MD;  Location: [facility];  Service: Cardiovascular;  Laterality: Right;  . PERIPHERAL VASCULAR CATHETERIZATION Bilateral 05/18/2015   Procedure: Renal Intervention;  Surgeon: Algernon Huxley, MD;  Location: [facility];  Service: Cardiovascular;  Laterality: Bilateral;    Social History Social History   Tobacco Use  . Smoking status: Never Smoker  . Smokeless tobacco: Never Used  Substance Use Topics  . Alcohol use: Yes    Comment: occasional  . Drug use: No    Family History Family History  Problem Relation Age of Onset  . Nephrolithiasis Mother   . Cancer Father     No Known Allergies   REVIEW OF SYSTEMS (Negative unless checked)  Constitutional: [] Weight loss  [] Fever  [] Chills Cardiac: [] Chest pain   [] Chest pressure   [] Palpitations   [] Shortness of breath when laying flat   [] Shortness of breath with exertion. Vascular:  [] Pain in legs with walking   [] Pain  in legs at rest  [] History of DVT   [] Phlebitis   [] Swelling in legs   [] Varicose veins   [] Non-healing ulcers Pulmonary:   [] Uses home oxygen   [] Productive cough   [] Hemoptysis   [] Wheeze  [] COPD   [] Asthma Neurologic:  [] Dizziness   [] Seizures   [] History of stroke   [] History of TIA  [] Aphasia   [] Vissual changes   [] Weakness or numbness in arm   [] Weakness or numbness in leg Musculoskeletal:   [] Joint swelling   [] Joint pain   [] Low back pain Hematologic:  [] Easy bruising  [] Easy bleeding   [] Hypercoagulable state   [] Anemic Gastrointestinal:  [] Diarrhea   [] Vomiting  [] Gastroesophageal  reflux/heartburn   [] Difficulty swallowing. Genitourinary:  [] Chronic kidney disease   [] Difficult urination  [] Frequent urination   [] Blood in urine Skin:  [] Rashes   [] Ulcers  Psychological:  [] History of anxiety   []  History of major depression.  Physical Examination  Vitals:   12/16/17 0850  BP: 120/78  Pulse: 73  Resp: 16  Weight: 212 lb 6.4 oz (96.3 kg)  Height: 6' (1.829 m)   Body mass index is 28.81 kg/m. Gen: WD/WN, NAD Head: [place]/AT, No temporalis wasting.  Ear/Nose/Throat: Hearing grossly intact, nares w/o erythema or drainage Eyes: PER, EOMI, sclera nonicteric.  Neck: Supple, no large masses.   Pulmonary:  Good air movement, no audible wheezing bilaterally, no use of accessory muscles.  Cardiac:  no JVD Vascular:  Vessel Right Left  Radial Palpable Palpable  Gastrointestinal: Non-distended. No guarding/no peritoneal signs.  Musculoskeletal: M/S 5/5 throughout.  No deformity or atrophy.  Neurologic: CN 2-12 intact. Symmetrical.  Speech is fluent. Motor exam as listed above. Psychiatric: Judgment intact, Mood & affect appropriate for pt's clinical situation. Dermatologic: No rashes or ulcers noted of the hands, neck or face.  No changes consistent with cellulitis in these areas.  CBC Lab Results  Component Value Date   WBC 9.6 06/01/2015   HGB 14.7 06/01/2015   HCT 42.6 06/01/2015   MCV 92.0 06/01/2015   PLT 245 06/01/2015    BMET    Component Value Date/Time   NA 137 06/01/2015 0431   K 3.8 06/01/2015 0431   CL 99 (L) 06/01/2015 0431   CO2 31 06/01/2015 0431   GLUCOSE 112 (H) 06/01/2015 0431   BUN 24 (H) 06/01/2015 0431   CREATININE 1.53 (H) 06/01/2015 0431   CALCIUM 9.1 06/01/2015 0431   GFRNONAA 53 (L) 06/01/2015 0431   GFRAA >60 06/01/2015 0431   CrCl cannot be calculated (Patient's most recent lab result is older than the maximum 21 days allowed.).  COAG Lab Results  Component Value Date   INR 1.00 05/26/2015   INR 0.98 05/16/2015     Radiology No results found.   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  BP is outstanding today  The patient's vital signs and noninvasive studies support the renal artery dissection is not significantly changed when compared to the previous study.  In fact, his duplex ultrasound today was essentially normal bilaterally  No invasive studies or intervention is indicated at this time.  The patient will continue the current antihypertensive medications, no changes at this time.  The primary medical service will continue aggressive antihypertensive therapy as per the AHA guidelines   - VAS US RENAL ARTERY DUPLEX; Future  2. 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 Essential hypertension Continue antihypertensive medications as already ordered,  these medications have been reviewed and there are no changes at this time.   4. Mixed hyperlipidemia Continue statin as ordered and reviewed, no changes at this time     Hortencia Pilar, MD  12/18/2017 10:22 AM

## 2017-12-27 MED FILL — METOPROLOL SUCCINATE ER 25: 25 | 30 days supply | Qty: 30 | Fill #0

## 2018-01-23 DIAGNOSIS — M1612 Unilateral primary osteoarthritis, left hip: Secondary | ICD-10-CM | POA: Diagnosis not present

## 2018-01-23 DIAGNOSIS — M5432 Sciatica, left side: Secondary | ICD-10-CM | POA: Diagnosis not present

## 2018-01-23 MED FILL — predniSONE 10 MG TABS: 10 | 12 days supply | Qty: 42 | Fill #0

## 2018-01-23 MED FILL — CYCLOBENZAPRINE 10 MG TAB: 10 | 30 days supply | Qty: 30 | Fill #0

## 2018-01-24 MED FILL — HYDROCODON-APAP 10-325: 10-325 | 5 days supply | Qty: 20 | Fill #0

## 2018-01-31 DIAGNOSIS — E782 Mixed hyperlipidemia: Secondary | ICD-10-CM | POA: Diagnosis not present

## 2018-01-31 DIAGNOSIS — Z79899 Other long term (current) drug therapy: Secondary | ICD-10-CM | POA: Diagnosis not present

## 2018-01-31 DIAGNOSIS — Z0000 Encounter for general adult medical examination without abnormal findings: Secondary | ICD-10-CM | POA: Diagnosis not present

## 2018-01-31 DIAGNOSIS — I10 Essential (primary) hypertension: Secondary | ICD-10-CM | POA: Diagnosis not present

## 2018-01-31 MED FILL — METOPROLOL SUCCINATE ER 25: 25 | 30 days supply | Qty: 30 | Fill #0

## 2018-02-04 DIAGNOSIS — Z6826 Body mass index (BMI) 26.0-26.9, adult: Secondary | ICD-10-CM | POA: Diagnosis not present

## 2018-02-04 DIAGNOSIS — M5416 Radiculopathy, lumbar region: Secondary | ICD-10-CM | POA: Diagnosis not present

## 2018-02-07 DIAGNOSIS — E782 Mixed hyperlipidemia: Secondary | ICD-10-CM | POA: Diagnosis not present

## 2018-02-07 DIAGNOSIS — Z79899 Other long term (current) drug therapy: Secondary | ICD-10-CM | POA: Diagnosis not present

## 2018-02-07 DIAGNOSIS — Z125 Encounter for screening for malignant neoplasm of prostate: Secondary | ICD-10-CM | POA: Diagnosis not present

## 2018-02-07 DIAGNOSIS — I10 Essential (primary) hypertension: Secondary | ICD-10-CM | POA: Diagnosis not present

## 2018-02-11 DIAGNOSIS — M5127 Other intervertebral disc displacement, lumbosacral region: Secondary | ICD-10-CM | POA: Diagnosis not present

## 2018-02-11 DIAGNOSIS — M5416 Radiculopathy, lumbar region: Secondary | ICD-10-CM | POA: Diagnosis not present

## 2018-02-17 MED FILL — ATORVASTATIN 20 MG TABLET: 20 | 30 days supply | Qty: 30 | Fill #0

## 2018-02-20 DIAGNOSIS — M5416 Radiculopathy, lumbar region: Secondary | ICD-10-CM | POA: Diagnosis not present

## 2018-02-20 DIAGNOSIS — I10 Essential (primary) hypertension: Secondary | ICD-10-CM | POA: Diagnosis not present

## 2018-02-20 DIAGNOSIS — Z6826 Body mass index (BMI) 26.0-26.9, adult: Secondary | ICD-10-CM | POA: Diagnosis not present

## 2018-02-26 MED FILL — METOPROLOL SUCCINATE ER 25: 25 | 30 days supply | Qty: 30 | Fill #0

## 2018-03-06 MED FILL — OMEPRAZOLE 40 MG CPDR: 40 | 90 days supply | Qty: 90 | Fill #2

## 2018-03-10 MED FILL — raNITIdine HCL 300 MG TABS: 300 | 30 days supply | Qty: 30 | Fill #0

## 2018-03-14 DIAGNOSIS — M5126 Other intervertebral disc displacement, lumbar region: Secondary | ICD-10-CM | POA: Diagnosis not present

## 2018-03-14 DIAGNOSIS — M5127 Other intervertebral disc displacement, lumbosacral region: Secondary | ICD-10-CM | POA: Diagnosis not present

## 2018-03-14 DIAGNOSIS — M5117 Intervertebral disc disorders with radiculopathy, lumbosacral region: Secondary | ICD-10-CM | POA: Diagnosis not present

## 2018-03-14 MED FILL — CYCLOBENZAPRINE 10 MG TAB: 10 | 10 days supply | Qty: 30 | Fill #0

## 2018-03-14 MED FILL — HYDROCODON-APAP 5-325: 5-325 | 5 days supply | Qty: 50 | Fill #0

## 2018-03-25 MED FILL — LOSARTAN POTASSIUM 100 MG T: 100 | 90 days supply | Qty: 90 | Fill #1

## 2018-03-25 MED FILL — METOPROLOL SUCCINATE ER 25: 25 | 30 days supply | Qty: 30 | Fill #0

## 2018-04-07 MED FILL — ATORVASTATIN CALCIUM 20 MG: 20 | 30 days supply | Qty: 30 | Fill #1

## 2018-04-21 MED FILL — METOPROLOL SUCCINATE ER 25: 25 | 30 days supply | Qty: 30 | Fill #0

## 2018-04-21 MED FILL — raNITIdine HCL 300 MG TABS: 300 | 30 days supply | Qty: 30 | Fill #1

## 2018-05-07 ENCOUNTER — Other Ambulatory Visit: Payer: Self-pay | Admitting: Internal Medicine

## 2018-05-07 DIAGNOSIS — R1902 Left upper quadrant abdominal swelling, mass and lump: Secondary | ICD-10-CM | POA: Diagnosis not present

## 2018-05-07 DIAGNOSIS — Z79899 Other long term (current) drug therapy: Secondary | ICD-10-CM | POA: Diagnosis not present

## 2018-05-07 DIAGNOSIS — R7309 Other abnormal glucose: Secondary | ICD-10-CM | POA: Diagnosis not present

## 2018-05-07 DIAGNOSIS — E782 Mixed hyperlipidemia: Secondary | ICD-10-CM | POA: Diagnosis not present

## 2018-05-09 ENCOUNTER — Ambulatory Visit
Admission: RE | Admit: 2018-05-09 | Discharge: 2018-05-09 | Disposition: A | Discharge status: Home / Self Care | Payer: 59 | Source: Ambulatory Visit | Attending: Internal Medicine | Admitting: Internal Medicine

## 2018-05-09 DIAGNOSIS — R1902 Left upper quadrant abdominal swelling, mass and lump: Secondary | ICD-10-CM

## 2018-05-09 DIAGNOSIS — K760 Fatty (change of) liver, not elsewhere classified: Secondary | ICD-10-CM | POA: Insufficient documentation

## 2018-05-09 DIAGNOSIS — R1900 Intra-abdominal and pelvic swelling, mass and lump, unspecified site: Secondary | ICD-10-CM | POA: Diagnosis not present

## 2018-05-22 ENCOUNTER — Ambulatory Visit: Payer: Self-pay | Admitting: General Surgery

## 2018-05-22 DIAGNOSIS — Z01818 Encounter for other preprocedural examination: Secondary | ICD-10-CM | POA: Diagnosis not present

## 2018-05-22 DIAGNOSIS — R222 Localized swelling, mass and lump, trunk: Secondary | ICD-10-CM | POA: Diagnosis not present

## 2018-05-22 NOTE — H&P (Signed)
PATIENT PROFILE: Johnny Robinson is a 52 y.o. male who presents to the Clinic for consultation at the request of Dr. Doy Hutching for evaluation of abdominal wall mass.  PCP:  Idelle Crouch, MD  HISTORY OF PRESENT ILLNESS: [name] reports he noticed an abdominal wall mass on the left upper quadrant since 3 months ago. The patient refers that he had back surgery 3 months ago and gained some weight and noticed the mass. The mass cause minor pain. Pain localized to the mass. Pain does not radiates. Pain aggravated with pressure or some movements. Pain improves by itself. Does not need pain medication. Patient refers this mass has increased in the last 3 month significantly. He is concerned about malignant lesion.   Patient back surgery due to herniated disk. Doing well. No back pain or deferred pain to the left leg.    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[place]) 06/08/2015   Acute renal insufficiency 06/08/2015   GERD (gastroesophageal reflux disease) 09/27/2014      GENERAL REVIEW OF SYSTEMS:   General ROS: negative for - chills, fatigue, fever, weight gain or weight loss Allergy and Immunology ROS: negative for - hives  Hematological and Lymphatic ROS: negative for - bleeding problems or bruising, negative for palpable nodes Endocrine ROS: negative for - heat or cold intolerance, hair changes Respiratory ROS: negative for - cough, shortness of breath or wheezing Cardiovascular ROS: no chest pain or palpitations GI ROS: negative for nausea, vomiting, abdominal pain, diarrhea, constipation Musculoskeletal ROS: negative for - joint swelling or muscle pain. Positive for Back pain Neurological ROS: negative for - confusion, syncope Dermatological ROS: negative for pruritus and rash. Positive for mass on abdominal wall Psychiatric: negative for anxiety, depression,  difficulty sleeping and memory loss  MEDICATIONS: CurrentMedications        Current Outpatient Medications  Medication Sig Dispense Refill  . ASPIRIN ORAL Take 325 mg by mouth.      Marland Kitchen atorvastatin (LIPITOR) 20 MG tablet Take 1 tablet (20 mg total) by mouth once daily 30 tablet 5  . losartan (COZAAR) 100 MG tablet TAKE 1 TABLET (100 MG TOTAL) BY MOUTH ONCE DAILY. 30 tablet 11  . metoprolol succinate (TOPROL-XL) 25 MG XL tablet TAKE 1 TABLET BY MOUTH ONCE DAILY 30 tablet 6  . nitroGLYcerin (NITROSTAT) 0.4 MG SL tablet Place under the tongue.    Marland Kitchen omeprazole (PRILOSEC) 40 MG DR capsule TAKE 1 CAPSULE (40 MG TOTAL) BY MOUTH ONCE DAILY. 90 capsule 3  . ranitidine (ZANTAC) 300 MG tablet Take 1 tablet (300 mg total) by mouth nightly 30 tablet 11   No current facility-administered medications for this visit.       ALLERGIES: Patient has no known allergies.  PAST MEDICAL HISTORY:     Past Medical History:  Diagnosis Date  . Dyslipidemia, unspecified   . GERD (gastroesophageal reflux disease)   . Subdural hematoma (CMS-HCC) 1992    PAST SURGICAL HISTORY:      Past Surgical History:  Procedure Laterality Date  . BONE SPUR REMOVAL  2006  . EGD  11/06/2010   No repeat per RTE  . SUBDURAL HEMATOMA SURGERY  1991     FAMILY HISTORY:      Family History  Problem Relation Age of Onset  . Esophageal  cancer Father   . High blood pressure (Hypertension) Mother   . Diabetes Mother   . No Known Problems Daughter   . No Known Problems Daughter   . No Known Problems Son   . Breast cancer Other      SOCIAL HISTORY: Social History          Socioeconomic History  . Marital status: Married    Spouse name: Not on file  . Number of children: Not on file  . Years of education: Not on file  . Highest education level: Not on file  Occupational History  . Not on file  Social Needs  . Financial resource strain: Not on file  . Food insecurity:     Worry: Not on file    Inability: Not on file  . Transportation needs:    Medical: Not on file    Non-medical: Not on file  Tobacco Use  . Smoking status: Never Smoker  . Smokeless tobacco: Never Used  Substance and Sexual Activity  . Alcohol use: No    Alcohol/week: 0.0 oz  . Drug use: Not on file  . Sexual activity: Yes    Partners: Female  Other Topics Concern  . Not on file  Social History Narrative  . Not on file      PHYSICAL EXAM:    Vitals:   05/22/18 0756  BP: 118/74  Pulse: 75  Temp: 36.4 C (97.5 F)   Body mass index is 28.5 kg/m. Weight: 98 kg (216 lb)   GENERAL: Alert, active, oriented x3  HEENT: Pupils equal reactive to light. Extraocular movements are intact. Sclera clear. Palpebral conjunctiva normal red color.Pharynx clear.  NECK: Supple with no palpable mass and no adenopathy.  LUNGS: Sound clear with no rales rhonchi or wheezes.  HEART: Regular rhythm S1 and S2 without murmur.  ABDOMEN: Soft and depressible, nontender with no palpable mass, no hepatomegaly. There is a large 10 cm x 8 cm mass on the left upper quadrant. Does not reduce, mobile, soft, discomfort on palpation. No skin changes over mass  EXTREMITIES: Well-developed well-nourished symmetrical with no dependent edema.  NEUROLOGICAL: Awake alert oriented, facial expression symmetrical, moving all extremities.  REVIEW OF DATA: I have reviewed the following data today:      No visits with results within 3 Month(s) from this visit.  Latest known visit with results is:  Appointment on 02/07/2018  Component Date Value  . Glucose 02/07/2018 113*  . Sodium 02/07/2018 138   . Potassium 02/07/2018 4.2   . Chloride 02/07/2018 103   . Carbon Dioxide (CO2) 02/07/2018 26.6   . Urea Nitrogen (BUN) 02/07/2018 26*  . Creatinine 02/07/2018 1.2   . Glomerular Filtration Ra* 02/07/2018 64   . Calcium 02/07/2018 9.5   . AST  02/07/2018 16   . ALT  02/07/2018 32   . Alk  Phos (alkaline Phosp* 02/07/2018 67   . Albumin 02/07/2018 4.0   . Bilirubin, Total 02/07/2018 1.1   . Protein, Total 02/07/2018 6.2   . A/G Ratio 02/07/2018 1.8   . WBC (White Blood Cell Co* 02/07/2018 10.0   . RBC (Red Blood Cell Coun* 02/07/2018 5.12   . Hemoglobin 02/07/2018 16.6   . Hematocrit 02/07/2018 48.2   . MCV (Mean Corpuscular Vo* 02/07/2018 94.1   . MCH (Mean Corpuscular He* 02/07/2018 32.4*  . MCHC (Mean Corpuscular H* 02/07/2018 34.4   . Platelet Count 02/07/2018 202   . RDW-CV (Red Cell Distrib* 02/07/2018 11.9   .  MPV (Mean Platelet Volum* 02/07/2018 10.0   . Neutrophils 02/07/2018 6.59   . Lymphocytes 02/07/2018 2.25   . Monocytes 02/07/2018 0.84   . Eosinophils 02/07/2018 0.20   . Basophils 02/07/2018 0.04   . Neutrophil % 02/07/2018 66.1   . Lymphocyte % 02/07/2018 22.6   . Monocyte % 02/07/2018 8.4   . Eosinophil % 02/07/2018 2.0   . Basophil% 02/07/2018 0.4   . Immature Granulocyte % 02/07/2018 0.5   . Immature Granulocyte Cou* 02/07/2018 0.05   . Cholesterol, Total 02/07/2018 230*  . Triglyceride 02/07/2018 373*  . HDL (High Density Lipopr* 02/07/2018 37.1   . LDL (Low Density Lipopro* 02/07/2018 118   . VLDL Cholesterol 02/07/2018 75   . Cholesterol/HDL Ratio 02/07/2018 6.2   . Color 02/07/2018 Yellow   . Clarity 02/07/2018 Clear   . Specific Gravity 02/07/2018 1.020   . pH, Urine 02/07/2018 6.0   . Protein, Urinalysis 02/07/2018 Negative   . Glucose, Urinalysis 02/07/2018 Negative   . Ketones, Urinalysis 02/07/2018 Negative   . Blood, Urinalysis 02/07/2018 Small*  . Nitrite, Urinalysis 02/07/2018 Negative   . Leukocyte Esterase, Urin* 02/07/2018 Negative   . White Blood Cells, Urina* 02/07/2018 None Seen   . Red Blood Cells, Urinaly* 02/07/2018 4-10*  . Bacteria, Urinalysis 02/07/2018 Rare*  . Squamous Epithelial Cell* 02/07/2018 None Seen   . PSA (Prostate Specific A* 02/07/2018 0.62     Abdominal ultrasound images reviewed but does not has  any images of the specific area of the lump.   ASSESSMENT: Mr. Bossard is a 52 y.o. male presenting for consultation for a mass on the left upper abdominal wall.  The patient has a mass on the left upper abdominal quadrant that is causing some pain and discomfort on pressure and has increased significant in size. 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 Due to the rapid increase in size and the actual size of 10 cm, excisional biopsy is indicated. Patient oriented about the procedure, benefits and risk.   PLAN: 1. Excisional biopsy of upper abdominal wall soft tissue mass 22901 2. CBC, CMP 3. Stop aspirin 5 days before surgery 4. Contact us if has any question or concern.   Patient and his wife verbalized understanding, all questions were answered, and were agreeable with the plan outlined above.   Herbert Pun, MD  Electronically signed by Herbert Pun, MD

## 2018-05-22 NOTE — H&P (View-Only) (Signed)
PATIENT PROFILE: Johnny Robinson is a 52 y.o. male who presents to the Clinic for consultation at the request of Dr. Doy Hutching for evaluation of abdominal wall mass.  PCP:  Idelle Crouch, MD  HISTORY OF PRESENT ILLNESS: Johnny Robinson reports he noticed an abdominal wall mass on the left upper quadrant since 3 months ago. The patient refers that he had back surgery 3 months ago and gained some weight and noticed the mass. The mass cause minor pain. Pain localized to the mass. Pain does not radiates. Pain aggravated with pressure or some movements. Pain improves by itself. Does not need pain medication. Patient refers this mass has increased in the last 3 month significantly. He is concerned about malignant lesion.   Patient back surgery due to herniated disk. Doing well. No back pain or deferred pain to the left leg.    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[place]) 06/08/2015   Acute renal insufficiency 06/08/2015   GERD (gastroesophageal reflux disease) 09/27/2014      GENERAL REVIEW OF SYSTEMS:   General ROS: negative for - chills, fatigue, fever, weight gain or weight loss Allergy and Immunology ROS: negative for - hives  Hematological and Lymphatic ROS: negative for - bleeding problems or bruising, negative for palpable nodes Endocrine ROS: negative for - heat or cold intolerance, hair changes Respiratory ROS: negative for - cough, shortness of breath or wheezing Cardiovascular ROS: no chest pain or palpitations GI ROS: negative for nausea, vomiting, abdominal pain, diarrhea, constipation Musculoskeletal ROS: negative for - joint swelling or muscle pain. Positive for Back pain Neurological ROS: negative for - confusion, syncope Dermatological ROS: negative for pruritus and rash. Positive for mass on abdominal wall Psychiatric: negative for anxiety, depression,  difficulty sleeping and memory loss  MEDICATIONS: CurrentMedications        Current Outpatient Medications  Medication Sig Dispense Refill  . ASPIRIN ORAL Take 325 mg by mouth.      Marland Kitchen atorvastatin (LIPITOR) 20 MG tablet Take 1 tablet (20 mg total) by mouth once daily 30 tablet 5  . losartan (COZAAR) 100 MG tablet TAKE 1 TABLET (100 MG TOTAL) BY MOUTH ONCE DAILY. 30 tablet 11  . metoprolol succinate (TOPROL-XL) 25 MG XL tablet TAKE 1 TABLET BY MOUTH ONCE DAILY 30 tablet 6  . nitroGLYcerin (NITROSTAT) 0.4 MG SL tablet Place under the tongue.    Marland Kitchen omeprazole (PRILOSEC) 40 MG DR capsule TAKE 1 CAPSULE (40 MG TOTAL) BY MOUTH ONCE DAILY. 90 capsule 3  . ranitidine (ZANTAC) 300 MG tablet Take 1 tablet (300 mg total) by mouth nightly 30 tablet 11   No current facility-administered medications for this visit.       ALLERGIES: Patient has no known allergies.  PAST MEDICAL HISTORY:     Past Medical History:  Diagnosis Date  . Dyslipidemia, unspecified   . GERD (gastroesophageal reflux disease)   . Subdural hematoma (CMS-[facility]) 1992    PAST SURGICAL HISTORY:      Past Surgical History:  Procedure Laterality Date  . BONE SPUR REMOVAL  2006  . EGD  11/06/2010   No repeat per RTE  . SUBDURAL HEMATOMA SURGERY  1991     FAMILY HISTORY:      Family History  Problem Relation Age of Onset  . Esophageal  cancer Father   . High blood pressure (Hypertension) Mother   . Diabetes Mother   . No Known Problems Daughter   . No Known Problems Daughter   . No Known Problems Son   . Breast cancer Other      SOCIAL HISTORY: Social History          Socioeconomic History  . Marital status: Married    Spouse name: Not on file  . Number of children: Not on file  . Years of education: Not on file  . Highest education level: Not on file  Occupational History  . Not on file  Social Needs  . Financial resource strain: Not on file  . Food insecurity:     Worry: Not on file    Inability: Not on file  . Transportation needs:    Medical: Not on file    Non-medical: Not on file  Tobacco Use  . Smoking status: Never Smoker  . Smokeless tobacco: Never Used  Substance and Sexual Activity  . Alcohol use: No    Alcohol/week: 0.0 oz  . Drug use: Not on file  . Sexual activity: Yes    Partners: Female  Other Topics Concern  . Not on file  Social History Narrative  . Not on file      PHYSICAL EXAM:    Vitals:   05/22/18 0756  BP: 118/74  Pulse: 75  Temp: 36.4 C (97.5 F)   Body mass index is 28.5 kg/m. Weight: 98 kg (216 lb)   GENERAL: Alert, active, oriented x3  HEENT: Pupils equal reactive to light. Extraocular movements are intact. Sclera clear. Palpebral conjunctiva normal red color.Pharynx clear.  NECK: Supple with no palpable mass and no adenopathy.  LUNGS: Sound clear with no rales rhonchi or wheezes.  HEART: Regular rhythm S1 and S2 without murmur.  ABDOMEN: Soft and depressible, nontender with no palpable mass, no hepatomegaly. There is a large 10 cm x 8 cm mass on the left upper quadrant. Does not reduce, mobile, soft, discomfort on palpation. No skin changes over mass  EXTREMITIES: Well-developed well-nourished symmetrical with no dependent edema.  NEUROLOGICAL: Awake alert oriented, facial expression symmetrical, moving all extremities.  REVIEW OF DATA: I have reviewed the following data today:      No visits with results within 3 Month(s) from this visit.  Latest known visit with results is:  Appointment on 02/07/2018  Component Date Value  . Glucose 02/07/2018 113*  . Sodium 02/07/2018 138   . Potassium 02/07/2018 4.2   . Chloride 02/07/2018 103   . Carbon Dioxide (CO2) 02/07/2018 26.6   . Urea Nitrogen (BUN) 02/07/2018 26*  . Creatinine 02/07/2018 1.2   . Glomerular Filtration Ra* 02/07/2018 64   . Calcium 02/07/2018 9.5   . AST  02/07/2018 16   . ALT  02/07/2018 32   . Alk  Phos (alkaline Phosp* 02/07/2018 67   . Albumin 02/07/2018 4.0   . Bilirubin, Total 02/07/2018 1.1   . Protein, Total 02/07/2018 6.2   . A/G Ratio 02/07/2018 1.8   . WBC (White Blood Cell Co* 02/07/2018 10.0   . RBC (Red Blood Cell Coun* 02/07/2018 5.12   . Hemoglobin 02/07/2018 16.6   . Hematocrit 02/07/2018 48.2   . MCV (Mean Corpuscular Vo* 02/07/2018 94.1   . MCH (Mean Corpuscular He* 02/07/2018 32.4*  . MCHC (Mean Corpuscular H* 02/07/2018 34.4   . Platelet Count 02/07/2018 202   . RDW-CV (Red Cell Distrib* 02/07/2018 11.9   .  MPV (Mean Platelet Volum* 02/07/2018 10.0   . Neutrophils 02/07/2018 6.59   . Lymphocytes 02/07/2018 2.25   . Monocytes 02/07/2018 0.84   . Eosinophils 02/07/2018 0.20   . Basophils 02/07/2018 0.04   . Neutrophil % 02/07/2018 66.1   . Lymphocyte % 02/07/2018 22.6   . Monocyte % 02/07/2018 8.4   . Eosinophil % 02/07/2018 2.0   . Basophil% 02/07/2018 0.4   . Immature Granulocyte % 02/07/2018 0.5   . Immature Granulocyte Cou* 02/07/2018 0.05   . Cholesterol, Total 02/07/2018 230*  . Triglyceride 02/07/2018 373*  . HDL (High Density Lipopr* 02/07/2018 37.1   . LDL (Low Density Lipopro* 02/07/2018 118   . VLDL Cholesterol 02/07/2018 75   . Cholesterol/HDL Ratio 02/07/2018 6.2   . Color 02/07/2018 Yellow   . Clarity 02/07/2018 Clear   . Specific Gravity 02/07/2018 1.020   . pH, Urine 02/07/2018 6.0   . Protein, Urinalysis 02/07/2018 Negative   . Glucose, Urinalysis 02/07/2018 Negative   . Ketones, Urinalysis 02/07/2018 Negative   . Blood, Urinalysis 02/07/2018 Small*  . Nitrite, Urinalysis 02/07/2018 Negative   . Leukocyte Esterase, Urin* 02/07/2018 Negative   . White Blood Cells, Urina* 02/07/2018 None Seen   . Red Blood Cells, Urinaly* 02/07/2018 4-10*  . Bacteria, Urinalysis 02/07/2018 Rare*  . Squamous Epithelial Cell* 02/07/2018 None Seen   . PSA (Prostate Specific A* 02/07/2018 0.62     Abdominal ultrasound images reviewed but does not has  any images of the specific area of the lump.   ASSESSMENT: Mr. Wolbert is a 52 y.o. male presenting for consultation for a mass on the left upper abdominal wall.  The patient has a mass on the left upper abdominal quadrant that is causing some pain and discomfort on pressure and has increased significant in size. 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 Due to the rapid increase in size and the actual size of 10 cm, excisional biopsy is indicated. Patient oriented about the procedure, benefits and risk.   PLAN: 1. Excisional biopsy of upper abdominal wall soft tissue mass 22901 2. CBC, CMP 3. Stop aspirin 5 days before surgery 4. Contact us if has any question or concern.   Patient and his wife verbalized understanding, all questions were answered, and were agreeable with the plan outlined above.   Herbert Pun, MD  Electronically signed by Herbert Pun, MD

## 2018-05-23 ENCOUNTER — Inpatient Hospital Stay
Admission: RE | Admit: 2018-05-23 | Discharge: 2018-05-23 | Disposition: A | Discharge status: Home / Self Care | Payer: 59 | Source: Ambulatory Visit

## 2018-05-23 NOTE — Pre-Procedure Instructions (Signed)
Telephone call to patient to complete [place]. Patient unavailable per wife as he is in court until 4 to 5pm each day. Asked if he could call us if he has a free moment. Wife stated she would pass on this info.

## 2018-05-26 ENCOUNTER — Encounter
Admission: RE | Admit: 2018-05-26 | Discharge: 2018-05-26 | Disposition: A | Discharge status: Home / Self Care | Payer: 59 | Source: Ambulatory Visit | Attending: General Surgery | Admitting: General Surgery

## 2018-05-26 ENCOUNTER — Other Ambulatory Visit: Payer: Self-pay

## 2018-05-26 ENCOUNTER — Encounter: Payer: Self-pay | Admitting: *Deleted

## 2018-05-26 MED FILL — METOPROLOL SUCCINATE ER 25: 25 | 90 days supply | Qty: 90 | Fill #1

## 2018-05-26 MED FILL — raNITIdine HCL 300 MG TABS: 300 | 90 days supply | Qty: 90 | Fill #2

## 2018-05-26 MED FILL — ATORVASTATIN CALCIUM 20 MG: 20 | 90 days supply | Qty: 90 | Fill #2

## 2018-05-26 NOTE — Patient Instructions (Signed)
Your procedure is scheduled on:05/28/18 Report to Day Surgery. [facility]OND FLOOR To find out your arrival time please call [phone] between 1PM - 3PM on 05/27/18  Remember: Instructions that are not followed completely may result in serious medical risk,  up to and including death, or upon the discretion of your surgeon and anesthesiologist your  surgery may need to be rescheduled.     _X__ 1. Do not eat food after midnight the night before your procedure.                 No gum chewing or hard candies. You may drink clear liquids up to 2 hours                 before you are scheduled to arrive for your surgery- DO not drink clear                 liquids within 2 hours of the start of your surgery.                 Clear Liquids include:  water, apple juice without pulp, clear carbohydrate                 drink such as Clearfast of Gatorade, Black Coffee or Tea (Do not add                 anything to coffee or tea).  __X__2.  On the morning of surgery brush your teeth with toothpaste and water, you                may rinse your mouth with mouthwash if you wish.  Do not swallow any toothpaste of mouthwash.     _X__ 3.  No Alcohol for 24 hours before or after surgery.   _X__ 4.  Do Not Smoke or use e-cigarettes For 24 Hours Prior to Your Surgery.                 Do not use any chewable tobacco products for at least 6 hours prior to                 surgery.  ____  5.  Bring all medications with you on the day of surgery if instructed.   __X__  6.  Notify your doctor if there is any change in your medical condition      (cold, fever, infections).     Do not wear jewelry, make-up, hairpins, clips or nail polish. Do not wear lotions, powders, or perfumes. You may wear deodorant. Do not shave 48 hours prior to surgery. Men may shave face and neck. Do not bring valuables to the hospital.    [facility] is not responsible for any belongings or  valuables.  Contacts, dentures or bridgework may not be worn into surgery. Leave your suitcase in the car. After surgery it may be brought to your room. For patients admitted to the hospital, discharge time is determined by your treatment team.   Patients discharged the day of surgery will not be allowed to drive home.     _X___ Take these medicines the morning of surgery with A SIP OF WATER:    1. METOPROLOL  2. OMEPRAZOLE AT BEDTIME 05/27/18 AND AM OF SURGERY  3.   4.  5.  6.  ____ Fleet Enema (as directed)   ____ Use CHG Soap as directed  ____ Use inhalers on the day of surgery  ____ Stop  metformin 2 days prior to surgery    ____ Take 1/2 of usual insulin dose the night before surgery. No insulin the morning          of surgery.   __X__ Stop Coumadin/Plavix/aspirin on  ON HOLD FOR SURGERY  ____ Stop Anti-inflammatories on    ____ Stop supplements until after surgery.    ____ Bring C-Pap to the hospital.

## 2018-05-27 MED ORDER — CEFAZOLIN SODIUM-DEXTROSE 2-4 GM/100ML-% IV SOLN
2.0000 g | INTRAVENOUS | Status: AC
  Administered 2018-05-28: 2 g via INTRAVENOUS

## 2018-05-28 ENCOUNTER — Encounter
Admission: RE | Disposition: A | Discharge status: Home / Self Care | Payer: Self-pay | Source: Ambulatory Visit | Attending: General Surgery

## 2018-05-28 ENCOUNTER — Ambulatory Visit: Payer: 59 | Admitting: Registered Nurse

## 2018-05-28 ENCOUNTER — Ambulatory Visit
Admission: RE | Admit: 2018-05-28 | Discharge: 2018-05-28 | Disposition: A | Discharge status: Home / Self Care | Payer: 59 | Source: Ambulatory Visit | Attending: General Surgery | Admitting: General Surgery

## 2018-05-28 ENCOUNTER — Encounter: Payer: Self-pay | Admitting: *Deleted

## 2018-05-28 ENCOUNTER — Other Ambulatory Visit: Payer: Self-pay

## 2018-05-28 DIAGNOSIS — I10 Essential (primary) hypertension: Secondary | ICD-10-CM | POA: Diagnosis not present

## 2018-05-28 DIAGNOSIS — D171 Benign lipomatous neoplasm of skin and subcutaneous tissue of trunk: Secondary | ICD-10-CM | POA: Diagnosis not present

## 2018-05-28 DIAGNOSIS — R1900 Intra-abdominal and pelvic swelling, mass and lump, unspecified site: Secondary | ICD-10-CM | POA: Diagnosis not present

## 2018-05-28 DIAGNOSIS — R222 Localized swelling, mass and lump, trunk: Secondary | ICD-10-CM | POA: Diagnosis not present

## 2018-05-28 DIAGNOSIS — K219 Gastro-esophageal reflux disease without esophagitis: Secondary | ICD-10-CM | POA: Insufficient documentation

## 2018-05-28 DIAGNOSIS — N189 Chronic kidney disease, unspecified: Secondary | ICD-10-CM | POA: Diagnosis not present

## 2018-05-28 DIAGNOSIS — Z7982 Long term (current) use of aspirin: Secondary | ICD-10-CM | POA: Insufficient documentation

## 2018-05-28 DIAGNOSIS — E785 Hyperlipidemia, unspecified: Secondary | ICD-10-CM | POA: Insufficient documentation

## 2018-05-28 DIAGNOSIS — Z79899 Other long term (current) drug therapy: Secondary | ICD-10-CM | POA: Diagnosis not present

## 2018-05-28 DIAGNOSIS — I129 Hypertensive chronic kidney disease with stage 1 through stage 4 chronic kidney disease, or unspecified chronic kidney disease: Secondary | ICD-10-CM | POA: Diagnosis not present

## 2018-05-28 HISTORY — DX: Traumatic subdural hemorrhage with loss of consciousness of unspecified duration, initial encounter: S06.5X9A

## 2018-05-28 HISTORY — DX: Essential (primary) hypertension: I10

## 2018-05-28 HISTORY — DX: Chronic kidney disease, unspecified: N18.9

## 2018-05-28 HISTORY — PX: EXCISION MASS ABDOMINAL: SHX6701

## 2018-05-28 SURGERY — EXCISION, MASS, TORSO
Anesthesia: General | Site: Abdomen | Laterality: Left

## 2018-05-28 MED ORDER — OXYCODONE HCL 5 MG/5ML PO SOLN: 5.0000 mg | Freq: Once | ORAL | Status: DC | PRN

## 2018-05-28 MED ORDER — LIDOCAINE HCL (CARDIAC) PF 100 MG/5ML IV SOSY
PREFILLED_SYRINGE | INTRAVENOUS | Status: DC | PRN
  Administered 2018-05-28: 100 mg via INTRAVENOUS

## 2018-05-28 MED ORDER — SUCCINYLCHOLINE CHLORIDE 20 MG/ML IJ SOLN
INTRAMUSCULAR | Status: DC | PRN
  Administered 2018-05-28: 100 mg via INTRAVENOUS

## 2018-05-28 MED ORDER — LACTATED RINGERS IV SOLN
INTRAVENOUS | Status: DC | PRN
  Administered 2018-05-28: 10:00:00 via INTRAVENOUS

## 2018-05-28 MED ORDER — ONDANSETRON HCL 4 MG/2ML IJ SOLN
INTRAMUSCULAR | Status: DC | PRN
  Administered 2018-05-28: 4 mg via INTRAVENOUS

## 2018-05-28 MED ORDER — CEFAZOLIN SODIUM-DEXTROSE 2-4 GM/100ML-% IV SOLN
INTRAVENOUS | Status: AC
  Filled 2018-05-28: qty 100

## 2018-05-28 MED ORDER — OXYCODONE HCL 5 MG PO TABS: 5.0000 mg | ORAL_TABLET | Freq: Once | ORAL | Status: DC | PRN

## 2018-05-28 MED ORDER — FENTANYL CITRATE (PF) 100 MCG/2ML IJ SOLN
INTRAMUSCULAR | Status: DC | PRN
  Administered 2018-05-28: 100 ug via INTRAVENOUS

## 2018-05-28 MED ORDER — BUPIVACAINE-EPINEPHRINE 0.5% -1:200000 IJ SOLN
INTRAMUSCULAR | Status: DC | PRN
  Administered 2018-05-28: 30 mL

## 2018-05-28 MED ORDER — MIDAZOLAM HCL 2 MG/2ML IJ SOLN
INTRAMUSCULAR | Status: DC | PRN
  Administered 2018-05-28: 2 mg via INTRAVENOUS

## 2018-05-28 MED ORDER — FENTANYL CITRATE (PF) 100 MCG/2ML IJ SOLN: 25.0000 ug | INTRAMUSCULAR | Status: DC | PRN

## 2018-05-28 MED ORDER — HYDROCODONE-ACETAMINOPHEN 5-325 MG PO TABS: 1.0000 | ORAL_TABLET | ORAL | 0 refills | Status: AC | PRN

## 2018-05-28 MED ORDER — MIDAZOLAM HCL 2 MG/2ML IJ SOLN
INTRAMUSCULAR | Status: AC
  Filled 2018-05-28: qty 2

## 2018-05-28 MED ORDER — FENTANYL CITRATE (PF) 250 MCG/5ML IJ SOLN
INTRAMUSCULAR | Status: AC
  Filled 2018-05-28: qty 5

## 2018-05-28 MED ORDER — BUPIVACAINE-EPINEPHRINE (PF) 0.5% -1:200000 IJ SOLN
INTRAMUSCULAR | Status: AC
  Filled 2018-05-28: qty 30

## 2018-05-28 MED ORDER — PROPOFOL 10 MG/ML IV BOLUS
INTRAVENOUS | Status: DC | PRN
  Administered 2018-05-28: 160 mg via INTRAVENOUS

## 2018-05-28 MED ORDER — LACTATED RINGERS IV SOLN
INTRAVENOUS | Status: DC
  Administered 2018-05-28: 10:00:00 via INTRAVENOUS

## 2018-05-28 MED ORDER — DEXAMETHASONE SODIUM PHOSPHATE 10 MG/ML IJ SOLN
INTRAMUSCULAR | Status: DC | PRN
  Administered 2018-05-28: 10 mg via INTRAVENOUS

## 2018-05-28 MED ORDER — PROPOFOL 500 MG/50ML IV EMUL
INTRAVENOUS | Status: AC
  Filled 2018-05-28: qty 50

## 2018-05-28 MED FILL — HYDROCODON-APAP 5-325: 5-325 | 2 days supply | Qty: 10 | Fill #0

## 2018-05-28 SURGICAL SUPPLY — 32 items
BLADE SURG 15 STRL LF DISP TIS (BLADE) ×1 IMPLANT
BLADE SURG 15 STRL SS (BLADE) ×1
CHLORAPREP W/TINT 26ML (MISCELLANEOUS) ×2 IMPLANT
CNTNR SPEC 2.5X3XGRAD LEK (MISCELLANEOUS)
CONT SPEC 4OZ STER OR WHT (MISCELLANEOUS)
CONTAINER SPEC 2.5X3XGRAD LEK (MISCELLANEOUS) IMPLANT
DERMABOND ADVANCED (GAUZE/BANDAGES/DRESSINGS) ×1
DERMABOND ADVANCED .7 DNX12 (GAUZE/BANDAGES/DRESSINGS) ×1 IMPLANT
DRAPE LAPAROTOMY 100X77 ABD (DRAPES) ×2 IMPLANT
DRAPE UNDER BUTTOCK W/FLU (DRAPES) IMPLANT
ELECT REM PT RETURN 9FT ADLT (ELECTROSURGICAL) ×2
ELECTRODE REM PT RTRN 9FT ADLT (ELECTROSURGICAL) ×1 IMPLANT
GLOVE BIO SURGEON STRL SZ 6.5 (GLOVE) ×2 IMPLANT
GLOVE BIOGEL PI IND STRL 6.5 (GLOVE) ×1 IMPLANT
GLOVE BIOGEL PI INDICATOR 6.5 (GLOVE) ×1
KIT TURNOVER KIT A (KITS) ×2 IMPLANT
LABEL OR SOLS (LABEL) IMPLANT
MARGIN MAP 10MM (MISCELLANEOUS) IMPLANT
NEEDLE HYPO 25X1 1.5 SAFETY (NEEDLE) ×2 IMPLANT
NS IRRIG 500ML POUR BTL (IV SOLUTION) ×2 IMPLANT
PACK BASIN MINOR ARMC (MISCELLANEOUS) ×2 IMPLANT
SUT ETHILON 3-0 (SUTURE) ×2 IMPLANT
SUT MNCRL 4-0 (SUTURE) ×2
SUT MNCRL 4-0 27XMFL (SUTURE) ×2
SUT VIC AB 2-0 SH 27 (SUTURE)
SUT VIC AB 2-0 SH 27XBRD (SUTURE) IMPLANT
SUT VIC AB 3-0 SH 27 (SUTURE) ×1
SUT VIC AB 3-0 SH 27X BRD (SUTURE) ×1 IMPLANT
SUT VIC AB 4-0 SH 27 (SUTURE) ×1
SUT VIC AB 4-0 SH 27XANBCTRL (SUTURE) ×1 IMPLANT
SUTURE MNCRL 4-0 27XMF (SUTURE) ×2 IMPLANT
SYR 10ML LL (SYRINGE) ×2 IMPLANT

## 2018-05-28 NOTE — Discharge Instructions (Signed)
°  Diet: Resume home heart healthy regular diet.  ° °Activity: Increase activity as tolerated, light activity and walking are encouraged. Do not drive or drink alcohol if taking narcotic pain medications. ° °Wound care: May shower with soapy water and pat dry (do not rub incisions), but no baths or submerging incision underwater until follow-up. (no swimming)  ° °Medications: Resume all home medications. For mild to moderate pain: acetaminophen (Tylenol) or ibuprofen (if no kidney disease). Combining Tylenol with alcohol can substantially increase your risk of causing liver disease. Narcotic pain medications, if prescribed, can be used for severe pain, though may cause nausea, constipation, and drowsiness. Do not combine Tylenol and Norco within a 6 hour period as Norco contains Tylenol. If you do not need the narcotic pain medication, you do not need to fill the prescription. ° °Call office ([phone]) at any time if any questions, worsening pain, fevers/chills, bleeding, drainage from incision site, or other concerns. ° °AMBULATORY SURGERY  °DISCHARGE INSTRUCTIONS ° ° °1) The drugs that you were given will stay in your system until tomorrow so for the next 24 hours you should not: ° °A) Drive an automobile °B) Make any legal decisions °C) Drink any alcoholic beverage ° ° °2) You may resume regular meals tomorrow.  Today it is better to start with liquids and gradually work up to solid foods. ° °You may eat anything you prefer, but it is better to start with liquids, then soup and crackers, and gradually work up to solid foods. ° ° °3) Please notify your doctor immediately if you have any unusual bleeding, trouble breathing, redness and pain at the surgery site, drainage, fever, or pain not relieved by medication. ° ° ° °4) Additional Instructions: ° ° ° ° ° ° ° °Please contact your physician with any problems or Same Day Surgery at [phone], Monday through Friday 6 am to 4 pm, or [place] [facility] [address]  number at [phone]. ° °

## 2018-05-28 NOTE — Transfer of Care (Signed)
Immediate Anesthesia Transfer of Care Note  Patient: Johnny Robinson  Procedure(s) Performed: EXCISIONAL BIOPSY MASS ABDOMINAL WALL (Left Abdomen)  Patient Location: PACU  Anesthesia Type:General  Level of Consciousness: awake and alert   Airway & Oxygen Therapy: Patient Spontanous Breathing  Post-op Assessment: Report given to RN  Post vital signs: Reviewed and stable  Last Vitals:  Vitals Value Taken Time  BP 123/77 05/28/2018 10:59 AM  Temp 37 C 05/28/2018 10:59 AM  Pulse 76 05/28/2018 10:59 AM  Resp 14 05/28/2018 10:59 AM  SpO2 96 % 05/28/2018 10:59 AM  Vitals shown include unvalidated device data.  Last Pain:  Vitals:   05/28/18 0910  TempSrc: Tympanic  PainSc: 0-No pain         Complications: No apparent anesthesia complications

## 2018-05-28 NOTE — Anesthesia Procedure Notes (Signed)
Procedure Name: Intubation Date/Time: 05/28/2018 9:55 AM Performed by: Lesle Reek, CRNA Pre-anesthesia Checklist: Patient identified, Emergency Drugs available, Suction available, Patient being monitored and Timeout performed Patient Re-evaluated:Patient Re-evaluated prior to induction Oxygen Delivery Method: Circle system utilized Preoxygenation: Pre-oxygenation with 100% oxygen Induction Type: IV induction Ventilation: Mask ventilation without difficulty Laryngoscope Size: Mac and 4 Grade View: Grade II Tube type: Oral Tube size: 7.5 mm Number of attempts: 1 Airway Equipment and Method: Stylet Placement Confirmation: breath sounds checked- equal and bilateral,  CO2 detector,  positive ETCO2 and ETT inserted through vocal cords under direct vision Secured at: 22 cm Tube secured with: Tape

## 2018-05-28 NOTE — Anesthesia Preprocedure Evaluation (Addendum)
Anesthesia Evaluation  Patient identified by MRN, date of birth, ID band Patient awake    Reviewed: Allergy & Precautions, H&P , NPO status , Patient's Chart, lab work & pertinent test results  Airway Mallampati: III   Neck ROM: full   Comment: TM 2-3 FB Dental no notable dental hx. (+) Teeth Intact   Pulmonary neg pulmonary ROS, neg shortness of breath, neg COPD,    breath sounds clear to auscultation       Cardiovascular hypertension, + Peripheral Vascular Disease  (-) Past MI, (-) Cardiac Stents and (-) CABG (-) dysrhythmias  Rhythm:regular Rate:Normal     Neuro/Psych negative neurological ROS  negative psych ROS   GI/Hepatic negative GI ROS, Neg liver ROS, GERD  Controlled and Medicated,  Endo/Other  negative endocrine ROS  Renal/GU CRFRenal disease     Musculoskeletal   Abdominal   Peds  Hematology negative hematology ROS (+)   Anesthesia Other Findings Past Medical History: 2016: Chronic kidney disease     Comment:  RENAL INFARCT No date: GERD (gastroesophageal reflux disease) No date: Hypertension No date: Reflux 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[place])  Past Surgical History: 11/2017: BACK SURGERY 05/18/2015: PERIPHERAL VASCULAR CATHETERIZATION; Right     Comment:  Procedure: Renal Angiography;  Surgeon: Algernon Huxley, MD;              Location: [facility];  Service: Cardiovascular;              Laterality: Right; 05/18/2015: PERIPHERAL VASCULAR CATHETERIZATION; Bilateral     Comment:  Procedure: Renal Intervention;  Surgeon: Algernon Huxley,               MD;  Location: [facility];  Service:               Cardiovascular;  Laterality: Bilateral;     Reproductive/Obstetrics negative OB ROS                            Anesthesia Physical Anesthesia Plan  ASA: III  Anesthesia Plan: General ETT   Post-op Pain Management:    Induction:   PONV Risk Score and Plan:  Ondansetron and Dexamethasone  Airway Management Planned:   Additional Equipment:   Intra-op Plan:   Post-operative Plan:   Informed Consent: I have reviewed the patients History and Physical, chart, labs and discussed the procedure including the risks, benefits and alternatives for the proposed anesthesia with the patient or authorized representative who has indicated his/her understanding and acceptance.   Dental Advisory Given  Plan Discussed with: Anesthesiologist, CRNA and Surgeon  Anesthesia Plan Comments:         Anesthesia Quick Evaluation

## 2018-05-28 NOTE — Interval H&P Note (Signed)
History and Physical Interval Note:  05/28/2018 9:04 AM  Johnny Robinson  has presented today for surgery, with the diagnosis of ABDOMINAL WALL MASS  The various methods of treatment have been discussed with the patient and family. After consideration of risks, benefits and other options for treatment, the patient has consented to  Procedure(s): EXCISIONAL BIOPSY MASS ABDOMINAL WALL (N/A) as a surgical intervention .  The patient's history has been reviewed, patient examined, no change in status, stable for surgery.  I have reviewed the patient's chart and labs.  The area of the mass was marked in the pre procedure. Questions were answered to the patient's satisfaction.     Herbert Pun

## 2018-05-28 NOTE — Anesthesia Post-op Follow-up Note (Signed)
Anesthesia QCDR form completed.        

## 2018-05-28 NOTE — Brief Op Note (Signed)
05/28/2018  10:57 AM  PATIENT:  Johnny Robinson  52 y.o. male  PRE-OPERATIVE DIAGNOSIS:  ABDOMINAL WALL MASS  POST-OPERATIVE DIAGNOSIS:  ABDOMINAL WALL MASS  PROCEDURE:  Procedure(s): EXCISIONAL BIOPSY MASS ABDOMINAL WALL (Left)  SURGEON:  Surgeon(s) and Role:    * Herbert Pun, MD - Primary   ANESTHESIA:   local and general  EBL:  Minimal

## 2018-05-28 NOTE — Op Note (Signed)
Preoperative diagnosis: Abdominal wall mass  Postoperative diagnosis: Abdominal wall mass.  Procedure: Left upper abdomen wall excision of mass.  Anesthesia: GETA  Surgeon: Dr. Windell Moment  Wound Classification: Clean  Indications: Patient is a51 y.o.malewith a palpable left upper abdominal wallmass that was causing discomfort and increasing in size in a short period of time.   Findings: 1. Palpable mass atleft upper abdomen 2.Mass measured 8 cm x 6 cm x 2 cm (deep) 3. Adequate hemostasis  Description of procedure: The patient was taken to the operating room and placed supine on the operating table, and after general anesthesia was administered,the abdomen wasprepped and draped in the usual sterile fashion. A time-out was completed verifying correct patient, procedure, site, positioning, and implant(s) and/or special equipment prior to beginning this procedure.  Alinearskin incisionincision was plannedover the palpable mass. Local anesthesia was infiltrated and a skin incision was made.Dissection was carried down until the capsule of the mass was found and it was dissected around. The shiny fatty mass was dissected. A large 8 x 6 cm mass was excised. The empty space was approximated to decrease size of cavity. The skin was closed with a subcuticular suture of running monocryl4-0. ADermabondwas applied.  The patient tolerated the procedure well and was taken to the postanesthesia care unit in stable condition.   Specimen:Abdominal wall mass  Complications: None  Estimated Blood Loss:Minimal

## 2018-05-29 NOTE — Anesthesia Postprocedure Evaluation (Signed)
Anesthesia Post Note  Patient: Johnny Robinson  Procedure(s) Performed: EXCISIONAL BIOPSY MASS ABDOMINAL WALL (Left Abdomen)  Patient location during evaluation: PACU Anesthesia Type: General Level of consciousness: awake and alert Pain management: pain level controlled Vital Signs Assessment: post-procedure vital signs reviewed and stable Respiratory status: spontaneous breathing, nonlabored ventilation and respiratory function stable Cardiovascular status: blood pressure returned to baseline and stable Postop Assessment: no apparent nausea or vomiting Anesthetic complications: no     Last Vitals:  Vitals:   05/28/18 1134 05/28/18 1154  BP: 122/69 114/66  Pulse: 68 71  Resp: 16   Temp:    SpO2: 96% 97%    Last Pain:  Vitals:   05/28/18 1154  TempSrc:   PainSc: 0-No pain                 Durenda Hurt

## 2018-05-30 LAB — SURGICAL PATHOLOGY

## 2018-06-05 MED FILL — OMEPRAZOLE 40 MG CPDR: 40 | 90 days supply | Qty: 90 | Fill #3

## 2018-08-01 DIAGNOSIS — R7309 Other abnormal glucose: Secondary | ICD-10-CM | POA: Diagnosis not present

## 2018-08-01 DIAGNOSIS — E782 Mixed hyperlipidemia: Secondary | ICD-10-CM | POA: Diagnosis not present

## 2018-08-01 DIAGNOSIS — Z79899 Other long term (current) drug therapy: Secondary | ICD-10-CM | POA: Diagnosis not present

## 2018-08-19 DIAGNOSIS — I10 Essential (primary) hypertension: Secondary | ICD-10-CM | POA: Diagnosis not present

## 2018-08-19 DIAGNOSIS — Z79899 Other long term (current) drug therapy: Secondary | ICD-10-CM | POA: Diagnosis not present

## 2018-08-19 DIAGNOSIS — E782 Mixed hyperlipidemia: Secondary | ICD-10-CM | POA: Diagnosis not present

## 2018-08-19 DIAGNOSIS — R7309 Other abnormal glucose: Secondary | ICD-10-CM | POA: Diagnosis not present

## 2018-08-19 MED FILL — OMEPRAZOLE 40 MG CPDR: 40 | 90 days supply | Qty: 90 | Fill #0

## 2018-08-20 ENCOUNTER — Other Ambulatory Visit: Payer: Self-pay

## 2018-08-20 DIAGNOSIS — Z1211 Encounter for screening for malignant neoplasm of colon: Secondary | ICD-10-CM

## 2018-08-27 ENCOUNTER — Telehealth: Payer: Self-pay

## 2018-08-27 NOTE — Telephone Encounter (Signed)
Patient has been advised per Dr. Doy Hutching Blood Thinner Request Received 08/27/19 to stop Aspirin 325mg   And Eliquis 5 mg 5 days prior to Colonoscopy.  Thank you, Sharyn Lull

## 2018-08-29 MED FILL — SUPREP BOWEL PREP KIT: 17.5-3.13-1 | 1 days supply | Qty: 354 | Fill #0

## 2018-09-01 ENCOUNTER — Encounter: Payer: Self-pay | Admitting: *Deleted

## 2018-09-02 ENCOUNTER — Ambulatory Visit: Payer: 59 | Admitting: Certified Registered"

## 2018-09-02 ENCOUNTER — Encounter
Admission: RE | Disposition: A | Discharge status: Home / Self Care | Payer: Self-pay | Source: Home / Self Care | Attending: Gastroenterology

## 2018-09-02 ENCOUNTER — Ambulatory Visit
Admission: RE | Admit: 2018-09-02 | Discharge: 2018-09-02 | Disposition: A | Discharge status: Home / Self Care | Payer: 59 | Attending: Gastroenterology | Admitting: Gastroenterology

## 2018-09-02 DIAGNOSIS — K219 Gastro-esophageal reflux disease without esophagitis: Secondary | ICD-10-CM | POA: Diagnosis not present

## 2018-09-02 DIAGNOSIS — K635 Polyp of colon: Secondary | ICD-10-CM | POA: Diagnosis not present

## 2018-09-02 DIAGNOSIS — Z79899 Other long term (current) drug therapy: Secondary | ICD-10-CM | POA: Insufficient documentation

## 2018-09-02 DIAGNOSIS — D127 Benign neoplasm of rectosigmoid junction: Secondary | ICD-10-CM | POA: Diagnosis not present

## 2018-09-02 DIAGNOSIS — K640 First degree hemorrhoids: Secondary | ICD-10-CM | POA: Insufficient documentation

## 2018-09-02 DIAGNOSIS — K5730 Diverticulosis of large intestine without perforation or abscess without bleeding: Secondary | ICD-10-CM | POA: Insufficient documentation

## 2018-09-02 DIAGNOSIS — Z1211 Encounter for screening for malignant neoplasm of colon: Secondary | ICD-10-CM

## 2018-09-02 DIAGNOSIS — I10 Essential (primary) hypertension: Secondary | ICD-10-CM | POA: Diagnosis not present

## 2018-09-02 HISTORY — PX: COLONOSCOPY WITH PROPOFOL: SHX5780

## 2018-09-02 SURGERY — COLONOSCOPY WITH PROPOFOL
Anesthesia: General

## 2018-09-02 MED ORDER — PROPOFOL 10 MG/ML IV BOLUS
INTRAVENOUS | Status: DC | PRN
  Administered 2018-09-02: 90 mg via INTRAVENOUS
  Administered 2018-09-02: 40 mg via INTRAVENOUS
  Administered 2018-09-02: 10 mg via INTRAVENOUS

## 2018-09-02 MED ORDER — LIDOCAINE HCL (CARDIAC) PF 100 MG/5ML IV SOSY
PREFILLED_SYRINGE | INTRAVENOUS | Status: DC | PRN
  Administered 2018-09-02: 50 mg via INTRAVENOUS

## 2018-09-02 MED ORDER — PROPOFOL 500 MG/50ML IV EMUL
INTRAVENOUS | Status: DC | PRN
  Administered 2018-09-02: 125 ug/kg/min via INTRAVENOUS

## 2018-09-02 MED ORDER — SODIUM CHLORIDE 0.9 % IV SOLN
INTRAVENOUS | Status: DC
  Administered 2018-09-02: 1000 mL via INTRAVENOUS

## 2018-09-02 MED ORDER — PROPOFOL 500 MG/50ML IV EMUL
INTRAVENOUS | Status: AC
  Filled 2018-09-02: qty 50

## 2018-09-02 NOTE — Op Note (Signed)
[facility] Gastroenterology Patient Name: Johnny Robinson Procedure Date: 09/02/2018 8:39 AM MRN: [id number] Account #: [id number] Date of Birth: December 13, 1965 Admit Type: Outpatient Age: 52 Room: [facility] ENDO ROOM 4 Gender: Male Note Status: Finalized Procedure:            Colonoscopy Indications:          Screening for colorectal malignant neoplasm Providers:            Lucilla Lame MD, MD Referring MD:         Leonie Douglas. Doy Hutching, MD (Referring MD) Medicines:            Propofol per Anesthesia Complications:        No immediate complications. Procedure:            Pre-Anesthesia Assessment:                       - Prior to the procedure, a History and Physical was                        performed, and patient medications and allergies were                        reviewed. The patient's tolerance of previous                        anesthesia was also reviewed. The risks and benefits of                        the procedure and the sedation options and risks were                        discussed with the patient. All questions were                        answered, and informed consent was obtained. Prior                        Anticoagulants: The patient has taken no previous                        anticoagulant or antiplatelet agents. ASA Grade                        Assessment: II - A patient with mild systemic disease.                        After reviewing the risks and benefits, the patient was                        deemed in satisfactory condition to undergo the                        procedure.                       After obtaining informed consent, the colonoscope was                        passed under direct vision. Throughout the procedure,  the patient's blood pressure, pulse, and oxygen                        saturations were monitored continuously. The                        Colonoscope was introduced through the anus and       advanced to the the cecum, identified by appendiceal                        orifice and ileocecal valve. The colonoscopy was                        performed without difficulty. The patient tolerated the                        procedure well. The quality of the bowel preparation                        was excellent. Findings:      The perianal and digital rectal examinations were normal.      Five sessile polyps were found in the recto-sigmoid colon. The polyps       were 2 to 3 mm in size. These polyps were removed with a cold biopsy       forceps. Resection and retrieval were complete.      Multiple small-mouthed diverticula were found in the sigmoid colon.      Non-bleeding internal hemorrhoids were found during retroflexion. The       hemorrhoids were Grade I (internal hemorrhoids that do not prolapse). Impression:           - Five 2 to 3 mm polyps at the recto-sigmoid colon,                        removed with a cold biopsy forceps. Resected and                        retrieved.                       - Diverticulosis in the sigmoid colon.                       - Non-bleeding internal hemorrhoids. Recommendation:       - Discharge patient to home.                       - Resume previous diet.                       - Continue present medications.                       - Await pathology results.                       - Repeat colonoscopy in 5 years if polyp adenoma and 10                        years if hyperplastic Procedure Code(s):    --- Professional ---  45380, Colonoscopy, flexible; with biopsy, single or                        multiple Diagnosis Code(s):    --- Professional ---                       Z12.11, Encounter for screening for malignant neoplasm                        of colon                       D12.7, Benign neoplasm of rectosigmoid junction CPT copyright 2018 American Medical Association. All rights reserved. The codes documented in this  report are preliminary and upon coder review may  be revised to meet current compliance requirements. Lucilla Lame MD, MD 09/02/2018 9:10:46 AM This report has been signed electronically. Number of Addenda: 0 Note Initiated On: 09/02/2018 8:39 AM Scope Withdrawal Time: 0 hours 10 minutes 5 seconds  Total Procedure Duration: 0 hours 14 minutes 17 seconds       [facility]

## 2018-09-02 NOTE — Transfer of Care (Signed)
Immediate Anesthesia Transfer of Care Note  Patient: Johnny Robinson  Procedure(s) Performed: COLONOSCOPY WITH PROPOFOL (N/A )  Patient Location: PACU  Anesthesia Type:General  Level of Consciousness: drowsy  Airway & Oxygen Therapy: Patient Spontanous Breathing and Patient connected to nasal cannula oxygen  Post-op Assessment: Report given to RN and Post -op Vital signs reviewed and stable  Post vital signs: Reviewed and stable  Last Vitals:  Vitals Value Taken Time  BP 107/81 09/02/2018  9:12 AM  Temp    Pulse 87 09/02/2018  9:12 AM  Resp 14 09/02/2018  9:12 AM  SpO2 96 % 09/02/2018  9:12 AM    Last Pain:  Vitals:   09/02/18 0809  TempSrc:   PainSc: 0-No pain         Complications: No apparent anesthesia complications

## 2018-09-02 NOTE — Anesthesia Preprocedure Evaluation (Signed)
Anesthesia Evaluation  Patient identified by MRN, date of birth, ID band Patient awake    Reviewed: Allergy & Precautions, H&P , NPO status , Patient's Chart, lab work & pertinent test results, reviewed documented beta blocker date and time   Airway Mallampati: II   Neck ROM: full    Dental  (+) Poor Dentition   Pulmonary neg pulmonary ROS,    Pulmonary exam normal        Cardiovascular Exercise Tolerance: Good hypertension, On Medications negative cardio ROS Normal cardiovascular exam Rhythm:regular Rate:Normal     Neuro/Psych negative neurological ROS  negative psych ROS   GI/Hepatic Neg liver ROS, GERD  Medicated,  Endo/Other  negative endocrine ROS  Renal/GU Renal disease  negative genitourinary   Musculoskeletal   Abdominal   Peds  Hematology negative hematology ROS (+)   Anesthesia Other Findings Past Medical History: 2016: Chronic kidney disease     Comment:  RENAL INFARCT No date: GERD (gastroesophageal reflux disease) No date: Hypertension No date: Reflux 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[place]) Past Surgical History: 11/2017: BACK SURGERY 05/28/2018: EXCISION MASS ABDOMINAL; Left     Comment:  Procedure: EXCISIONAL BIOPSY MASS ABDOMINAL WALL;                Surgeon: Herbert Pun, MD;  Location: [facility] ORS;               Service: General;  Laterality: Left; 05/18/2015: PERIPHERAL VASCULAR CATHETERIZATION; Right     Comment:  Procedure: Renal Angiography;  Surgeon: Algernon Huxley, MD;              Location: [place] [facility];  Service: Cardiovascular;              Laterality: Right; 05/18/2015: PERIPHERAL VASCULAR CATHETERIZATION; Bilateral     Comment:  Procedure: Renal Intervention;  Surgeon: Algernon Huxley,               MD;  Location: [place] [facility];  Service:               Cardiovascular;  Laterality: Bilateral; BMI    Body Mass Index:  26.39 kg/m     Reproductive/Obstetrics negative OB  ROS                             Anesthesia Physical Anesthesia Plan  ASA: II  Anesthesia Plan: General   Post-op Pain Management:    Induction:   PONV Risk Score and Plan:   Airway Management Planned:   Additional Equipment:   Intra-op Plan:   Post-operative Plan:   Informed Consent: I have reviewed the patients History and Physical, chart, labs and discussed the procedure including the risks, benefits and alternatives for the proposed anesthesia with the patient or authorized representative who has indicated his/her understanding and acceptance.   Dental Advisory Given  Plan Discussed with: CRNA  Anesthesia Plan Comments:         Anesthesia Quick Evaluation

## 2018-09-02 NOTE — H&P (Signed)
Johnny Lame, MD [address] [place], [place] [address] Phone: [phone] Fax : [phone]  Primary Care Physician:  Idelle Crouch, MD Primary Gastroenterologist:  Dr. Allen Norris  Pre-Procedure History & Physical: HPI:  Johnny Robinson is a 52 y.o. male is here for a screening colonoscopy.   Past Medical History:  Diagnosis Date  . Chronic kidney disease 2016   RENAL INFARCT  . GERD (gastroesophageal reflux disease)   . Hypertension   . Reflux   . 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 BACK SURGERY  11/2017  . EXCISION MASS ABDOMINAL Left 05/28/2018   Procedure: EXCISIONAL BIOPSY MASS ABDOMINAL WALL;  Surgeon: Herbert Pun, MD;  Location: [facility] ORS;  Service: General;  Laterality: Left;  . PERIPHERAL VASCULAR CATHETERIZATION Right 05/18/2015   Procedure: Renal Angiography;  Surgeon: Algernon Huxley, MD;  Location: [place] [facility];  Service: Cardiovascular;  Laterality: Right;  . PERIPHERAL VASCULAR CATHETERIZATION Bilateral 05/18/2015   Procedure: Renal Intervention;  Surgeon: Algernon Huxley, MD;  Location: [place] [facility];  Service: Cardiovascular;  Laterality: Bilateral;    Prior to Admission medications   Medication Sig Start Date End Date Taking? Authorizing Provider  atorvastatin (LIPITOR) 20 MG tablet Take 1 tablet (20 mg total) by mouth daily at 6 PM. 06/01/15  Yes Sparks, Leonie Douglas, MD  metoprolol succinate (TOPROL-XL) 25 MG 24 hr tablet  11/19/16  Yes [provider]  metoprolol tartrate (LOPRESSOR) 25 MG tablet Take 1 tablet (25 mg total) by mouth 2 (two) times daily. 06/01/15  Yes Idelle Crouch, MD  omeprazole (PRILOSEC) 40 MG capsule TAKE ONE CAPSULE BY MOUTH EVERY MORNING 09/05/15  Yes Kasa, Maretta Bees, MD  ondansetron (ZOFRAN) 4 MG tablet Take 4 mg by mouth every 8 (eight) hours as needed for nausea or vomiting.   Yes [provider]  amLODipine (NORVASC) 5 MG tablet Take 1 tablet  (5 mg total) by mouth daily. Patient not taking: Reported on 05/26/2018 06/01/15   Idelle Crouch, MD  apixaban (ELIQUIS) 5 MG TABS tablet Take 1 tablet (5 mg total) by mouth 2 (two) times daily. Patient not taking: Reported on 05/26/2018 05/18/15   Idelle Crouch, MD  aspirin EC 81 MG EC tablet Take 1 tablet (81 mg total) by mouth daily. Patient not taking: Reported on 05/26/2018 06/01/15   Idelle Crouch, MD  HYDROcodone-acetaminophen [facility]) 10-325 MG per tablet Take 1 tablet by mouth every 4 (four) hours as needed for moderate pain. Patient not taking: Reported on 12/16/2017 05/18/15   Idelle Crouch, MD  naproxen (NAPROSYN) 500 MG tablet Take 1 tablet (500 mg total) by mouth 2 (two) times daily with a meal. Patient not taking: Reported on 05/26/2018 06/01/15   Idelle Crouch, MD  nitroGLYCERIN (NITROSTAT) 0.4 MG SL tablet Place 1 tablet (0.4 mg total) under the tongue every 5 (five) minutes as needed for chest pain. Patient not taking: Reported on 09/02/2018 06/01/15   Idelle Crouch, MD    Allergies as of 08/20/2018  . (No Known Allergies)    Family History  Problem Relation Age of Onset  . Nephrolithiasis Mother   . Cancer Father     Social History   Socioeconomic History  . Marital status: Married    Spouse name: Not on file  . Number of children: Not on file  . Years of education: Not on file  . Highest education  level: Not on file  Occupational History  . Not on file  Social Needs  . Financial resource strain: Not on file  . Food insecurity:    Worry: Not on file    Inability: Not on file  . Transportation needs:    Medical: Not on file    Non-medical: Not on file  Tobacco Use  . Smoking status: Never Smoker  . Smokeless tobacco: Never Used  Substance and Sexual Activity  . Alcohol use: Yes    Comment: occasional  . Drug use: No  . Sexual activity: Not on file  Lifestyle  . Physical activity:    Days per week: Not on file    Minutes per session: Not on  file  . Stress: Not on file  Relationships  . Social connections:    Talks on phone: Not on file    Gets together: Not on file    Attends religious service: Not on file    Active member of club or organization: Not on file    Attends meetings of clubs or organizations: Not on file    Relationship status: Not on file  . Intimate partner violence:    Fear of current or ex partner: Not on file    Emotionally abused: Not on file    Physically abused: Not on file    Forced sexual activity: Not on file  Other Topics Concern  . Not on file  Social History Narrative  . Not on file    Review of Systems: See HPI, otherwise negative ROS  Physical Exam: BP 105/74   Pulse 92   Temp (!) 97.5 F (36.4 C) (Tympanic)   Resp 20   Ht 6\' 1"  (1.854 m)   Wt 90.7 kg   SpO2 97%   BMI 26.39 kg/m  General:   Alert,  pleasant and cooperative in NAD Head:  Normocephalic and atraumatic. Neck:  Supple; no masses or thyromegaly. Lungs:  Clear throughout to auscultation.    Heart:  Regular rate and rhythm. Abdomen:  Soft, nontender and nondistended. Normal bowel sounds, without guarding, and without rebound.   Neurologic:  Alert and  oriented x4;  grossly normal neurologically.  Impression/Plan: Johnny Robinson is now here to undergo a screening colonoscopy.  Risks, benefits, and alternatives regarding colonoscopy have been reviewed with the patient.  Questions have been answered.  All parties agreeable.

## 2018-09-02 NOTE — Anesthesia Post-op Follow-up Note (Signed)
Anesthesia QCDR form completed.        

## 2018-09-03 ENCOUNTER — Encounter: Payer: Self-pay | Admitting: Gastroenterology

## 2018-09-03 NOTE — Anesthesia Postprocedure Evaluation (Signed)
Anesthesia Post Note  Patient: Johnny Robinson  Procedure(s) Performed: COLONOSCOPY WITH PROPOFOL (N/A )  Patient location during evaluation: PACU Anesthesia Type: General Level of consciousness: awake and alert Pain management: pain level controlled Vital Signs Assessment: post-procedure vital signs reviewed and stable Respiratory status: spontaneous breathing, nonlabored ventilation, respiratory function stable and patient connected to nasal cannula oxygen Cardiovascular status: blood pressure returned to baseline and stable Postop Assessment: no apparent nausea or vomiting Anesthetic complications: no     Last Vitals:  Vitals:   09/02/18 0940 09/02/18 0942  BP: 117/79 117/79  Pulse: 71 71  Resp: 19 (!) 23  Temp:    SpO2: 94% 96%    Last Pain:  Vitals:   09/02/18 0809  TempSrc:   PainSc: 0-No pain                 Molli Barrows

## 2018-09-04 ENCOUNTER — Encounter: Payer: Self-pay | Admitting: Gastroenterology

## 2018-09-04 LAB — SURGICAL PATHOLOGY

## 2018-09-08 MED FILL — METOPROLOL SUCCINATE ER 25: 25 | 90 days supply | Qty: 90 | Fill #2

## 2018-10-27 MED FILL — LOSARTAN POTASSIUM 100 MG T: 100 | 30 days supply | Qty: 30 | Fill #0

## 2018-11-07 MED FILL — predniSONE 10 MG TABS: 10 | 8 days supply | Qty: 26 | Fill #0

## 2018-11-07 MED FILL — FAMOTIDINE 20 MG TABLET: 20 | 30 days supply | Qty: 60 | Fill #0

## 2018-11-27 DIAGNOSIS — M5416 Radiculopathy, lumbar region: Secondary | ICD-10-CM | POA: Diagnosis not present

## 2018-12-03 DIAGNOSIS — M5416 Radiculopathy, lumbar region: Secondary | ICD-10-CM | POA: Diagnosis not present

## 2018-12-04 DIAGNOSIS — M5416 Radiculopathy, lumbar region: Secondary | ICD-10-CM | POA: Diagnosis not present

## 2018-12-04 DIAGNOSIS — M5127 Other intervertebral disc displacement, lumbosacral region: Secondary | ICD-10-CM | POA: Diagnosis not present

## 2018-12-09 MED FILL — LOSARTAN POTASSIUM 100 MG T: 100 | 90 days supply | Qty: 90 | Fill #1

## 2018-12-09 MED FILL — OMEPRAZOLE 40 MG CPDR: 40 | 90 days supply | Qty: 90 | Fill #1

## 2018-12-10 MED FILL — METOPROLOL SUCCINATE ER 25: 25 | 30 days supply | Qty: 30 | Fill #0

## 2018-12-16 MED FILL — ATORVASTATIN 20 MG TABLET: 20 | 30 days supply | Qty: 30 | Fill #3

## 2018-12-22 ENCOUNTER — Ambulatory Visit (INDEPENDENT_AMBULATORY_CARE_PROVIDER_SITE_OTHER): Payer: 59 | Admitting: Vascular Surgery

## 2018-12-22 ENCOUNTER — Encounter (INDEPENDENT_AMBULATORY_CARE_PROVIDER_SITE_OTHER): Payer: 59

## 2019-01-07 DIAGNOSIS — M5416 Radiculopathy, lumbar region: Secondary | ICD-10-CM | POA: Diagnosis not present

## 2019-01-14 DIAGNOSIS — M5416 Radiculopathy, lumbar region: Secondary | ICD-10-CM | POA: Diagnosis not present

## 2019-01-14 DIAGNOSIS — M5117 Intervertebral disc disorders with radiculopathy, lumbosacral region: Secondary | ICD-10-CM | POA: Diagnosis not present

## 2019-01-14 DIAGNOSIS — Z9889 Other specified postprocedural states: Secondary | ICD-10-CM | POA: Diagnosis not present

## 2019-01-16 MED FILL — METOPROLOL SUCCINATE ER 25: 25 | 90 days supply | Qty: 90 | Fill #1

## 2019-01-28 MED FILL — GABAPENTIN 300 MG CAPSULE: 300 | 30 days supply | Qty: 90 | Fill #0

## 2019-02-18 DIAGNOSIS — Z125 Encounter for screening for malignant neoplasm of prostate: Secondary | ICD-10-CM | POA: Diagnosis not present

## 2019-02-18 DIAGNOSIS — Z0000 Encounter for general adult medical examination without abnormal findings: Secondary | ICD-10-CM | POA: Diagnosis not present

## 2019-02-18 DIAGNOSIS — Z79899 Other long term (current) drug therapy: Secondary | ICD-10-CM | POA: Diagnosis not present

## 2019-02-18 DIAGNOSIS — R7309 Other abnormal glucose: Secondary | ICD-10-CM | POA: Diagnosis not present

## 2019-02-18 DIAGNOSIS — I10 Essential (primary) hypertension: Secondary | ICD-10-CM | POA: Diagnosis not present

## 2019-02-18 DIAGNOSIS — E782 Mixed hyperlipidemia: Secondary | ICD-10-CM | POA: Diagnosis not present

## 2019-02-18 MED FILL — ATORVASTATIN 20 MG TABLET: 20 | 90 days supply | Qty: 90 | Fill #0

## 2019-02-23 ENCOUNTER — Encounter (INDEPENDENT_AMBULATORY_CARE_PROVIDER_SITE_OTHER): Payer: 59

## 2019-02-23 ENCOUNTER — Encounter (INDEPENDENT_AMBULATORY_CARE_PROVIDER_SITE_OTHER): Payer: Self-pay

## 2019-02-23 ENCOUNTER — Ambulatory Visit (INDEPENDENT_AMBULATORY_CARE_PROVIDER_SITE_OTHER): Payer: 59 | Admitting: Vascular Surgery

## 2019-03-09 MED FILL — OMEPRAZOLE 40 MG CPDR: 40 | 90 days supply | Qty: 90 | Fill #2

## 2019-03-09 MED FILL — SM ACID REDUCER 20 MG TAB: 20 | 25 days supply | Qty: 50 | Fill #1

## 2019-03-18 ENCOUNTER — Encounter (INDEPENDENT_AMBULATORY_CARE_PROVIDER_SITE_OTHER): Payer: Self-pay | Admitting: Vascular Surgery

## 2019-03-19 DIAGNOSIS — L821 Other seborrheic keratosis: Secondary | ICD-10-CM | POA: Diagnosis not present

## 2019-03-19 DIAGNOSIS — L918 Other hypertrophic disorders of the skin: Secondary | ICD-10-CM | POA: Diagnosis not present

## 2019-03-19 DIAGNOSIS — L812 Freckles: Secondary | ICD-10-CM | POA: Diagnosis not present

## 2019-03-19 DIAGNOSIS — D485 Neoplasm of uncertain behavior of skin: Secondary | ICD-10-CM | POA: Diagnosis not present

## 2019-03-19 DIAGNOSIS — L820 Inflamed seborrheic keratosis: Secondary | ICD-10-CM | POA: Diagnosis not present

## 2019-05-11 MED FILL — METOPROLOL SUCCINATE ER 25: 25 | 90 days supply | Qty: 90 | Fill #2

## 2019-05-19 MED FILL — ATORVASTATIN 20 MG TABLET: 20 | 90 days supply | Qty: 90 | Fill #1

## 2019-06-01 MED FILL — LOSARTAN POTASSIUM 100 MG T: 100 | 30 days supply | Qty: 30 | Fill #2

## 2019-06-12 MED FILL — OMEPRAZOLE 40 MG CPDR: 40 | 90 days supply | Qty: 90 | Fill #3

## 2019-07-14 MED FILL — LOSARTAN POTASSIUM 100 MG T: 100 | 30 days supply | Qty: 30 | Fill #0

## 2019-08-18 MED FILL — LOSARTAN POTASSIUM 100 MG T: 100 | 30 days supply | Qty: 30 | Fill #1

## 2019-08-18 MED FILL — METOPROLOL SUCCINATE ER 25: 25 | 90 days supply | Qty: 90 | Fill #0

## 2019-09-03 MED FILL — ATORVASTATIN 20 MG TABLET: 20 | 90 days supply | Qty: 90 | Fill #2

## 2019-09-14 DIAGNOSIS — R0602 Shortness of breath: Secondary | ICD-10-CM | POA: Diagnosis not present

## 2019-09-14 DIAGNOSIS — Z79899 Other long term (current) drug therapy: Secondary | ICD-10-CM | POA: Diagnosis not present

## 2019-09-14 DIAGNOSIS — R509 Fever, unspecified: Secondary | ICD-10-CM | POA: Diagnosis not present

## 2019-09-14 DIAGNOSIS — R5381 Other malaise: Secondary | ICD-10-CM | POA: Diagnosis not present

## 2019-09-14 MED FILL — predniSONE 10 MG TABS: 10 | 6 days supply | Qty: 26 | Fill #0

## 2019-09-14 MED FILL — AZITHROMYCIN 500 MG TABS: 500 | 5 days supply | Qty: 5 | Fill #0

## 2019-09-19 MED FILL — OMEPRAZOLE 40 MG CPDR: 40 | 90 days supply | Qty: 90 | Fill #0

## 2019-09-21 MED FILL — GABAPENTIN 300 MG CAPSULE: 300 | 30 days supply | Qty: 90 | Fill #1

## 2019-09-28 MED FILL — LOSARTAN POTASSIUM 100 MG T: 100 | 30 days supply | Qty: 30 | Fill #2

## 2019-10-04 DIAGNOSIS — Z23 Encounter for immunization: Secondary | ICD-10-CM | POA: Diagnosis not present

## 2019-10-04 DIAGNOSIS — S61219A Laceration without foreign body of unspecified finger without damage to nail, initial encounter: Secondary | ICD-10-CM | POA: Diagnosis not present

## 2019-10-26 MED FILL — LOSARTAN POTASSIUM 100 MG T: 100 | 30 days supply | Qty: 30 | Fill #3

## 2019-11-10 DIAGNOSIS — M5412 Radiculopathy, cervical region: Secondary | ICD-10-CM | POA: Diagnosis not present

## 2019-11-10 MED FILL — METHYLPREDNISOLONE 4 MG TBP: 4 | 6 days supply | Qty: 21 | Fill #0

## 2019-11-11 ENCOUNTER — Other Ambulatory Visit: Payer: Self-pay | Admitting: Student

## 2019-11-11 DIAGNOSIS — M5412 Radiculopathy, cervical region: Secondary | ICD-10-CM

## 2019-11-25 MED FILL — METOPROLOL SUCCINATE ER 25: 25 | 90 days supply | Qty: 90 | Fill #1

## 2019-11-29 ENCOUNTER — Ambulatory Visit: Payer: 59

## 2019-12-01 MED FILL — ATORVASTATIN 20 MG TABLET: 20 | 90 days supply | Qty: 90 | Fill #3

## 2019-12-01 MED FILL — LOSARTAN POTASSIUM 100 MG T: 100 | 30 days supply | Qty: 30 | Fill #4

## 2019-12-04 ENCOUNTER — Ambulatory Visit: Payer: 59

## 2019-12-09 ENCOUNTER — Ambulatory Visit: Payer: 59

## 2019-12-12 MED FILL — OMEPRAZOLE 40 MG CPDR: 40 | 90 days supply | Qty: 90 | Fill #1

## 2020-01-13 MED FILL — LOSARTAN POTASSIUM 100 MG T: 100 | 30 days supply | Qty: 30 | Fill #5

## 2020-02-10 DIAGNOSIS — M25562 Pain in left knee: Secondary | ICD-10-CM | POA: Diagnosis not present

## 2020-02-10 DIAGNOSIS — M1612 Unilateral primary osteoarthritis, left hip: Secondary | ICD-10-CM | POA: Diagnosis not present

## 2020-02-10 DIAGNOSIS — M25561 Pain in right knee: Secondary | ICD-10-CM | POA: Diagnosis not present

## 2020-02-10 DIAGNOSIS — M25552 Pain in left hip: Secondary | ICD-10-CM | POA: Diagnosis not present

## 2020-02-24 ENCOUNTER — Other Ambulatory Visit (HOSPITAL_COMMUNITY): Payer: Self-pay | Admitting: Internal Medicine

## 2020-03-02 DIAGNOSIS — M25552 Pain in left hip: Secondary | ICD-10-CM | POA: Diagnosis not present

## 2020-03-12 MED FILL — OMEPRAZOLE 40 MG CPDR: 40 | 90 days supply | Qty: 90 | Fill #2

## 2020-03-14 ENCOUNTER — Ambulatory Visit: Admission: RE | Admit: 2020-03-14 | Payer: 59 | Source: Ambulatory Visit

## 2020-03-15 DIAGNOSIS — M5416 Radiculopathy, lumbar region: Secondary | ICD-10-CM | POA: Diagnosis not present

## 2020-03-15 DIAGNOSIS — G8929 Other chronic pain: Secondary | ICD-10-CM | POA: Diagnosis not present

## 2020-03-15 DIAGNOSIS — M5442 Lumbago with sciatica, left side: Secondary | ICD-10-CM | POA: Diagnosis not present

## 2020-03-15 DIAGNOSIS — M47814 Spondylosis without myelopathy or radiculopathy, thoracic region: Secondary | ICD-10-CM | POA: Diagnosis not present

## 2020-03-15 DIAGNOSIS — M47816 Spondylosis without myelopathy or radiculopathy, lumbar region: Secondary | ICD-10-CM | POA: Diagnosis not present

## 2020-03-15 DIAGNOSIS — R200 Anesthesia of skin: Secondary | ICD-10-CM | POA: Diagnosis not present

## 2020-03-15 DIAGNOSIS — M419 Scoliosis, unspecified: Secondary | ICD-10-CM | POA: Diagnosis not present

## 2020-03-15 MED FILL — GABAPENTIN 300 MG CAPSULE: 300 | 30 days supply | Qty: 30 | Fill #0

## 2020-03-16 ENCOUNTER — Other Ambulatory Visit: Payer: Self-pay | Admitting: Nurse Practitioner

## 2020-03-16 DIAGNOSIS — M5416 Radiculopathy, lumbar region: Secondary | ICD-10-CM

## 2020-03-16 DIAGNOSIS — G8929 Other chronic pain: Secondary | ICD-10-CM

## 2020-03-22 ENCOUNTER — Other Ambulatory Visit: Payer: Self-pay

## 2020-03-22 ENCOUNTER — Ambulatory Visit
Admission: RE | Admit: 2020-03-22 | Discharge: 2020-03-22 | Disposition: A | Discharge status: Home / Self Care | Payer: 59 | Source: Ambulatory Visit | Attending: Nurse Practitioner | Admitting: Nurse Practitioner

## 2020-03-22 DIAGNOSIS — M5442 Lumbago with sciatica, left side: Secondary | ICD-10-CM | POA: Diagnosis not present

## 2020-03-22 DIAGNOSIS — G8929 Other chronic pain: Secondary | ICD-10-CM | POA: Diagnosis not present

## 2020-03-22 DIAGNOSIS — M545 Low back pain: Secondary | ICD-10-CM | POA: Diagnosis not present

## 2020-03-22 DIAGNOSIS — M5416 Radiculopathy, lumbar region: Secondary | ICD-10-CM | POA: Insufficient documentation

## 2020-03-30 DIAGNOSIS — M5116 Intervertebral disc disorders with radiculopathy, lumbar region: Secondary | ICD-10-CM | POA: Diagnosis not present

## 2020-03-30 DIAGNOSIS — M5442 Lumbago with sciatica, left side: Secondary | ICD-10-CM | POA: Diagnosis not present

## 2020-03-30 DIAGNOSIS — G8929 Other chronic pain: Secondary | ICD-10-CM | POA: Diagnosis not present

## 2020-04-06 DIAGNOSIS — M5442 Lumbago with sciatica, left side: Secondary | ICD-10-CM | POA: Diagnosis not present

## 2020-04-18 DIAGNOSIS — M5442 Lumbago with sciatica, left side: Secondary | ICD-10-CM | POA: Diagnosis not present

## 2020-04-18 DIAGNOSIS — R7309 Other abnormal glucose: Secondary | ICD-10-CM | POA: Diagnosis not present

## 2020-04-18 DIAGNOSIS — Z0000 Encounter for general adult medical examination without abnormal findings: Secondary | ICD-10-CM | POA: Diagnosis not present

## 2020-04-18 DIAGNOSIS — E782 Mixed hyperlipidemia: Secondary | ICD-10-CM | POA: Diagnosis not present

## 2020-04-22 DIAGNOSIS — M5442 Lumbago with sciatica, left side: Secondary | ICD-10-CM | POA: Diagnosis not present

## 2020-04-22 DIAGNOSIS — E782 Mixed hyperlipidemia: Secondary | ICD-10-CM | POA: Diagnosis not present

## 2020-04-22 DIAGNOSIS — Z79899 Other long term (current) drug therapy: Secondary | ICD-10-CM | POA: Diagnosis not present

## 2020-04-22 DIAGNOSIS — M5116 Intervertebral disc disorders with radiculopathy, lumbar region: Secondary | ICD-10-CM | POA: Diagnosis not present

## 2020-04-22 DIAGNOSIS — R7309 Other abnormal glucose: Secondary | ICD-10-CM | POA: Diagnosis not present

## 2020-04-22 DIAGNOSIS — Z125 Encounter for screening for malignant neoplasm of prostate: Secondary | ICD-10-CM | POA: Diagnosis not present

## 2020-04-22 DIAGNOSIS — G8929 Other chronic pain: Secondary | ICD-10-CM | POA: Diagnosis not present

## 2020-04-22 MED FILL — NAPROXEN 500 MG TABS: 500 | 30 days supply | Qty: 60 | Fill #0

## 2020-05-07 MED FILL — LOSARTAN POTASSIUM 100 MG T: 100 | 30 days supply | Qty: 30 | Fill #1

## 2020-06-04 MED FILL — METOPROLOL SUCCINATE ER 25: 25 | 90 days supply | Qty: 90 | Fill #1

## 2020-06-04 MED FILL — ATORVASTATIN CALCIUM 20 MG: 20 | 90 days supply | Qty: 90 | Fill #1

## 2020-06-04 MED FILL — OMEPRAZOLE 40 MG CPDR: 40 | 90 days supply | Qty: 90 | Fill #3

## 2020-06-19 MED FILL — LOSARTAN POTASSIUM 100 MG T: 100 | 30 days supply | Qty: 30 | Fill #2

## 2020-07-19 ENCOUNTER — Other Ambulatory Visit (HOSPITAL_COMMUNITY): Payer: Self-pay | Admitting: Internal Medicine

## 2020-07-19 ENCOUNTER — Other Ambulatory Visit: Payer: Self-pay | Admitting: Internal Medicine

## 2020-07-19 DIAGNOSIS — R1032 Left lower quadrant pain: Secondary | ICD-10-CM | POA: Diagnosis not present

## 2020-07-19 DIAGNOSIS — Z79899 Other long term (current) drug therapy: Secondary | ICD-10-CM | POA: Diagnosis not present

## 2020-07-19 MED FILL — METRONIDAZOLE 500 MG TABS: 500 | 10 days supply | Qty: 30 | Fill #0

## 2020-07-19 MED FILL — DICYCLOMINE 20 MG TABLET: 20 | 7 days supply | Qty: 30 | Fill #0

## 2020-07-19 MED FILL — CIPROFLOXACIN HCL 500 MG TA: 500 | 10 days supply | Qty: 20 | Fill #0

## 2020-07-26 ENCOUNTER — Ambulatory Visit
Admission: RE | Admit: 2020-07-26 | Discharge: 2020-07-26 | Disposition: A | Discharge status: Home / Self Care | Payer: 59 | Source: Ambulatory Visit | Attending: Internal Medicine | Admitting: Internal Medicine

## 2020-07-26 ENCOUNTER — Other Ambulatory Visit: Payer: Self-pay

## 2020-07-26 DIAGNOSIS — R1032 Left lower quadrant pain: Secondary | ICD-10-CM | POA: Insufficient documentation

## 2020-07-26 DIAGNOSIS — R109 Unspecified abdominal pain: Secondary | ICD-10-CM | POA: Diagnosis not present

## 2020-07-26 MED ORDER — IOHEXOL 300 MG/ML  SOLN
100.0000 mL | Freq: Once | INTRAMUSCULAR | Status: AC | PRN
  Administered 2020-07-26: 100 mL via INTRAVENOUS

## 2020-08-10 DIAGNOSIS — M25552 Pain in left hip: Secondary | ICD-10-CM | POA: Diagnosis not present

## 2020-08-19 ENCOUNTER — Ambulatory Visit: Payer: 59 | Attending: Internal Medicine

## 2020-08-19 DIAGNOSIS — Z23 Encounter for immunization: Secondary | ICD-10-CM

## 2020-08-19 NOTE — Progress Notes (Signed)
   Covid-19 Vaccination Clinic  Name:  Johnny Robinson    MRN: [id number] DOB: 1966-06-27  08/19/2020  [name] was observed post Covid-19 immunization for 15 minutes without incident. He was provided with Vaccine Information Sheet and instruction to access the V-Safe system.   [name] was instructed to call 911 with any severe reactions post vaccine: Marland Kitchen Difficulty breathing  . Swelling of face and throat  . A fast heartbeat  . A bad rash all over body  . Dizziness and weakness   Immunizations Administered    Name Date Dose VIS Date Route   Pfizer COVID-19 Vaccine 08/19/2020  5:26 PM 0.3 mL 07/06/2020 Intramuscular   Manufacturer: [place]   Lot: [id number]   NDC: [address]-8

## 2020-08-20 MED FILL — ATORVASTATIN CALCIUM 20 MG: 20 | 90 days supply | Qty: 90 | Fill #2

## 2020-08-28 MED FILL — METOPROLOL SUCCINATE ER 25: 25 | 90 days supply | Qty: 90 | Fill #2

## 2020-08-28 MED FILL — LOSARTAN POTASSIUM 100 MG T: 100 | 30 days supply | Qty: 30 | Fill #3

## 2020-09-15 MED FILL — OMEPRAZOLE 40 MG CPDR: 40 | 90 days supply | Qty: 90 | Fill #0

## 2020-09-17 ENCOUNTER — Other Ambulatory Visit (HOSPITAL_COMMUNITY): Payer: Self-pay | Admitting: Internal Medicine

## 2020-10-19 DIAGNOSIS — M5137 Other intervertebral disc degeneration, lumbosacral region: Secondary | ICD-10-CM | POA: Diagnosis not present

## 2020-10-19 DIAGNOSIS — R7309 Other abnormal glucose: Secondary | ICD-10-CM | POA: Diagnosis not present

## 2020-10-19 DIAGNOSIS — Z79899 Other long term (current) drug therapy: Secondary | ICD-10-CM | POA: Diagnosis not present

## 2020-10-19 DIAGNOSIS — I10 Essential (primary) hypertension: Secondary | ICD-10-CM | POA: Diagnosis not present

## 2020-10-19 DIAGNOSIS — R55 Syncope and collapse: Secondary | ICD-10-CM | POA: Diagnosis not present

## 2020-10-19 DIAGNOSIS — E782 Mixed hyperlipidemia: Secondary | ICD-10-CM | POA: Diagnosis not present

## 2020-11-02 MED FILL — LOSARTAN POTASSIUM 100 MG T: 100 | 30 days supply | Qty: 30 | Fill #0

## 2020-12-05 MED FILL — OMEPRAZOLE 40 MG CPDR: 40 | 90 days supply | Qty: 90 | Fill #0

## 2020-12-05 MED FILL — METOPROLOL SUCCINATE ER 25: 25 | 90 days supply | Qty: 90 | Fill #3

## 2020-12-14 DIAGNOSIS — M25552 Pain in left hip: Secondary | ICD-10-CM | POA: Diagnosis not present

## 2020-12-14 MED FILL — LOSARTAN POTASSIUM 100 MG T: 100 | 90 days supply | Qty: 90 | Fill #1

## 2021-02-26 ENCOUNTER — Other Ambulatory Visit (HOSPITAL_COMMUNITY): Payer: Self-pay

## 2021-02-27 ENCOUNTER — Other Ambulatory Visit (HOSPITAL_COMMUNITY): Payer: Self-pay

## 2021-02-27 MED ORDER — ATORVASTATIN CALCIUM 20 MG PO TABS
ORAL_TABLET | ORAL | 3 refills | Status: DC
  Filled 2021-02-27: qty 90, 90d supply, fill #0
  Filled 2021-06-01: qty 90, 90d supply, fill #1
  Filled 2021-09-04: qty 90, 90d supply, fill #2

## 2021-02-27 MED ORDER — METOPROLOL SUCCINATE ER 25 MG PO TB24
1.0000 | ORAL_TABLET | Freq: Every day | ORAL | 3 refills | Status: DC
  Filled 2021-02-27: qty 90, 90d supply, fill #0
  Filled 2021-06-01: qty 90, 90d supply, fill #1
  Filled 2021-09-04: qty 90, 90d supply, fill #2

## 2021-03-17 ENCOUNTER — Other Ambulatory Visit (HOSPITAL_COMMUNITY): Payer: Self-pay

## 2021-03-17 MED FILL — Omeprazole Cap Delayed Release 40 MG: ORAL | 90 days supply | Qty: 90 | Fill #0 | Status: AC

## 2021-05-29 ENCOUNTER — Other Ambulatory Visit: Payer: Self-pay | Admitting: Orthopedic Surgery

## 2021-05-29 DIAGNOSIS — M25552 Pain in left hip: Secondary | ICD-10-CM

## 2021-06-01 ENCOUNTER — Other Ambulatory Visit (HOSPITAL_COMMUNITY): Payer: Self-pay

## 2021-06-05 ENCOUNTER — Other Ambulatory Visit (HOSPITAL_COMMUNITY): Payer: Self-pay

## 2021-06-05 MED ORDER — LOSARTAN POTASSIUM 100 MG PO TABS
ORAL_TABLET | ORAL | 3 refills | Status: DC
  Filled 2021-06-05: qty 90, 90d supply, fill #0
  Filled 2021-12-31: qty 90, 90d supply, fill #1

## 2021-06-05 MED ORDER — LOSARTAN POTASSIUM 100 MG PO TABS
ORAL_TABLET | Freq: Every day | ORAL | 3 refills | Status: DC
  Filled 2021-06-05: qty 90, fill #0

## 2021-06-06 ENCOUNTER — Other Ambulatory Visit: Payer: 59

## 2021-06-06 ENCOUNTER — Other Ambulatory Visit (HOSPITAL_COMMUNITY): Payer: Self-pay

## 2021-06-08 ENCOUNTER — Inpatient Hospital Stay: Admission: RE | Admit: 2021-06-08 | Payer: 59 | Source: Ambulatory Visit

## 2021-06-19 ENCOUNTER — Other Ambulatory Visit (HOSPITAL_COMMUNITY): Payer: Self-pay

## 2021-06-19 MED FILL — Omeprazole Cap Delayed Release 40 MG: ORAL | 90 days supply | Qty: 90 | Fill #1 | Status: AC

## 2021-06-23 DIAGNOSIS — M48062 Spinal stenosis, lumbar region with neurogenic claudication: Secondary | ICD-10-CM | POA: Diagnosis not present

## 2021-06-23 DIAGNOSIS — G8929 Other chronic pain: Secondary | ICD-10-CM | POA: Diagnosis not present

## 2021-06-23 DIAGNOSIS — M5442 Lumbago with sciatica, left side: Secondary | ICD-10-CM | POA: Diagnosis not present

## 2021-07-12 DIAGNOSIS — M48062 Spinal stenosis, lumbar region with neurogenic claudication: Secondary | ICD-10-CM | POA: Diagnosis not present

## 2021-07-12 DIAGNOSIS — M5442 Lumbago with sciatica, left side: Secondary | ICD-10-CM | POA: Diagnosis not present

## 2021-07-12 DIAGNOSIS — G8929 Other chronic pain: Secondary | ICD-10-CM | POA: Diagnosis not present

## 2021-07-12 DIAGNOSIS — M25552 Pain in left hip: Secondary | ICD-10-CM | POA: Diagnosis not present

## 2021-07-20 DIAGNOSIS — M25552 Pain in left hip: Secondary | ICD-10-CM | POA: Diagnosis not present

## 2021-07-20 DIAGNOSIS — G8929 Other chronic pain: Secondary | ICD-10-CM | POA: Diagnosis not present

## 2021-08-02 DIAGNOSIS — M5442 Lumbago with sciatica, left side: Secondary | ICD-10-CM | POA: Diagnosis not present

## 2021-08-02 DIAGNOSIS — G8929 Other chronic pain: Secondary | ICD-10-CM | POA: Diagnosis not present

## 2021-08-02 DIAGNOSIS — M48062 Spinal stenosis, lumbar region with neurogenic claudication: Secondary | ICD-10-CM | POA: Diagnosis not present

## 2021-08-02 DIAGNOSIS — M25552 Pain in left hip: Secondary | ICD-10-CM | POA: Diagnosis not present

## 2021-09-04 ENCOUNTER — Other Ambulatory Visit (HOSPITAL_COMMUNITY): Payer: Self-pay

## 2021-09-17 ENCOUNTER — Other Ambulatory Visit (HOSPITAL_COMMUNITY): Payer: Self-pay

## 2021-09-17 MED FILL — Omeprazole Cap Delayed Release 40 MG: ORAL | 90 days supply | Qty: 90 | Fill #2 | Status: CN

## 2021-09-18 ENCOUNTER — Other Ambulatory Visit (HOSPITAL_COMMUNITY): Payer: Self-pay

## 2021-09-19 ENCOUNTER — Other Ambulatory Visit (HOSPITAL_COMMUNITY): Payer: Self-pay

## 2021-09-22 ENCOUNTER — Other Ambulatory Visit (HOSPITAL_COMMUNITY): Payer: Self-pay

## 2021-09-22 MED ORDER — OMEPRAZOLE 40 MG PO CPDR
DELAYED_RELEASE_CAPSULE | ORAL | 0 refills | Status: DC
  Filled 2021-09-22: qty 30, 30d supply, fill #0

## 2021-09-23 ENCOUNTER — Other Ambulatory Visit (HOSPITAL_COMMUNITY): Payer: Self-pay

## 2021-10-17 DIAGNOSIS — M1612 Unilateral primary osteoarthritis, left hip: Secondary | ICD-10-CM | POA: Diagnosis not present

## 2021-10-17 DIAGNOSIS — M25552 Pain in left hip: Secondary | ICD-10-CM | POA: Diagnosis not present

## 2021-10-25 ENCOUNTER — Other Ambulatory Visit (HOSPITAL_COMMUNITY): Payer: Self-pay

## 2021-10-25 MED ORDER — OMEPRAZOLE 40 MG PO CPDR
DELAYED_RELEASE_CAPSULE | ORAL | 0 refills | Status: DC
  Filled 2021-10-25: qty 30, 30d supply, fill #0

## 2021-11-27 ENCOUNTER — Other Ambulatory Visit: Payer: Self-pay

## 2021-11-27 ENCOUNTER — Other Ambulatory Visit (INDEPENDENT_AMBULATORY_CARE_PROVIDER_SITE_OTHER): Payer: Self-pay | Admitting: Vascular Surgery

## 2021-11-27 ENCOUNTER — Ambulatory Visit (INDEPENDENT_AMBULATORY_CARE_PROVIDER_SITE_OTHER): Payer: 59 | Admitting: Vascular Surgery

## 2021-11-27 ENCOUNTER — Ambulatory Visit (INDEPENDENT_AMBULATORY_CARE_PROVIDER_SITE_OTHER): Payer: 59

## 2021-11-27 VITALS — BP 114/72 | HR 69 | Resp 16 | Ht 72.0 in | Wt 210.0 lb

## 2021-11-27 DIAGNOSIS — I10 Essential (primary) hypertension: Secondary | ICD-10-CM

## 2021-11-27 DIAGNOSIS — E782 Mixed hyperlipidemia: Secondary | ICD-10-CM | POA: Diagnosis not present

## 2021-11-27 DIAGNOSIS — I7773 Dissection of renal artery: Secondary | ICD-10-CM

## 2021-11-27 DIAGNOSIS — K219 Gastro-esophageal reflux disease without esophagitis: Secondary | ICD-10-CM

## 2021-11-27 NOTE — Progress Notes (Unsigned)
MRN : [id number]  Johnny Robinson is a 56 y.o. (Dec 14, 1965) male who presents with chief complaint of check renal artery.  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  No FMD.  No aortic disease.  There have been no interval changes in the patient's blood pressure control, in fact his blood pressure control is excellent and he is well within the American Heart Association guidelines.  He denies any major changes in is medications.  The patient denies headache or flushing.  No flank or unusual back pain.     There have been no significant changes to the patient's overall health care.   The patient denies amaurosis fugax or recent TIA symptoms. There are no recent neurological changes noted. The patient denies history of DVT, PE or superficial thrombophlebitis. The patient denies recent episodes of angina or shortness of breath.    Duplex ultrasound of the renal arteries shows a widely patent renal arteries with normal size kidneys( Rt kidney length *** and Left kidney length ***.  Previously Rt kidney length 10.30 and Left kidney length 11.50.)  No outpatient medications have been marked as taking for the 11/27/21 encounter (Appointment) with Delana Meyer, Dolores Lory, MD.    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    Past Surgical History:  Procedure Laterality Date   BACK SURGERY  11/2017   COLONOSCOPY WITH PROPOFOL N/A 09/02/2018   Procedure: COLONOSCOPY WITH PROPOFOL;  Surgeon: Lucilla Lame, MD;  Location: [facility] ENDOSCOPY;  Service: Endoscopy;   Laterality: N/A;   EXCISION MASS ABDOMINAL Left 05/28/2018   Procedure: EXCISIONAL BIOPSY MASS ABDOMINAL WALL;  Surgeon: Herbert Pun, MD;  Location: [facility] ORS;  Service: General;  Laterality: Left;   PERIPHERAL VASCULAR CATHETERIZATION Right 05/18/2015   Procedure: Renal Angiography;  Surgeon: Algernon Huxley, MD;  Location: [place] [facility];  Service: Cardiovascular;  Laterality: Right;   PERIPHERAL VASCULAR CATHETERIZATION Bilateral 05/18/2015   Procedure: Renal Intervention;  Surgeon: Algernon Huxley, MD;  Location: [place] [facility];  Service: Cardiovascular;  Laterality: Bilateral;    Social History Social History   Tobacco Use   Smoking status: Never   Smokeless tobacco: Never  Vaping Use   Vaping Use: Never used  Substance Use Topics   Alcohol use: Yes    Comment: occasional   Drug use: No    Family History Family History  Problem Relation Age of Onset   Nephrolithiasis Mother    Cancer Father     No Known Allergies   REVIEW OF SYSTEMS (Negative unless checked)  Constitutional: '[]'$ Weight loss  '[]'$ Fever  '[]'$ Chills Cardiac: '[]'$ Chest pain   '[]'$ Chest pressure   '[]'$ Palpitations   '[]'$ Shortness of breath when laying flat   '[]'$ Shortness of breath with exertion. Vascular:  '[]'$ Pain in legs with walking   '[]'$ Pain in legs at rest  '[]'$ History of DVT   '[]'$ Phlebitis   '[]'$ Swelling in legs   '[]'$ Varicose veins   '[]'$ Non-healing ulcers Pulmonary:   '[]'$ Uses home oxygen   '[]'$ Productive cough   '[]'$ Hemoptysis   '[]'$ Wheeze  '[]'$ COPD   '[]'$   Asthma Neurologic:  '[]'$ Dizziness   '[]'$ Seizures   '[]'$ History of stroke   '[]'$ History of TIA  '[]'$ Aphasia   '[]'$ Vissual changes   '[]'$ Weakness or numbness in arm   '[]'$ Weakness or numbness in leg Musculoskeletal:   '[]'$ Joint swelling   '[]'$ Joint pain   '[]'$ Low back pain Hematologic:  '[]'$ Easy bruising  '[]'$ Easy bleeding   '[]'$ Hypercoagulable state   '[]'$ Anemic Gastrointestinal:  '[]'$ Diarrhea   '[]'$ Vomiting  '[x]'$ Gastroesophageal reflux/heartburn   '[]'$ Difficulty swallowing. Genitourinary:  '[]'$ Chronic kidney  disease   '[]'$ Difficult urination  '[]'$ Frequent urination   '[]'$ Blood in urine Skin:  '[]'$ Rashes   '[]'$ Ulcers  Psychological:  '[]'$ History of anxiety   '[]'$  History of major depression.  Physical Examination  There were no vitals filed for this visit. There is no height or weight on file to calculate BMI. Gen: WD/WN, NAD Head: [place]/AT, No temporalis wasting.  Ear/Nose/Throat: Hearing grossly intact, nares w/o erythema or drainage Eyes: PER, EOMI, sclera nonicteric.  Neck: Supple, no masses.  No bruit or JVD.  Pulmonary:  Good air movement, no audible wheezing, no use of accessory muscles.  Cardiac: RRR, normal S1, S2, no Murmurs. Vascular:  *** Vessel Right Left  Radial Palpable Palpable  PT Palpable Palpable  DP Palpable Palpable  Gastrointestinal: soft, non-distended. No guarding/no peritoneal signs.  Musculoskeletal: M/S 5/5 throughout.  No visible deformity.  Neurologic: CN 2-12 intact. Pain and light touch intact in extremities.  Symmetrical.  Speech is fluent. Motor exam as listed above. Psychiatric: Judgment intact, Mood & affect appropriate for pt's clinical situation. Dermatologic: No rashes or ulcers noted.  No changes consistent with cellulitis.   CBC Lab Results  Component Value Date   WBC 9.6 06/01/2015   HGB 14.7 06/01/2015   HCT 42.6 06/01/2015   MCV 92.0 06/01/2015   PLT 245 06/01/2015    BMET    Component Value Date/Time   NA 137 06/01/2015 0431   K 3.8 06/01/2015 0431   CL 99 (L) 06/01/2015 0431   CO2 31 06/01/2015 0431   GLUCOSE 112 (H) 06/01/2015 0431   BUN 24 (H) 06/01/2015 0431   CREATININE 1.53 (H) 06/01/2015 0431   CALCIUM 9.1 06/01/2015 0431   GFRNONAA 53 (L) 06/01/2015 0431   GFRAA >60 06/01/2015 0431   CrCl cannot be calculated (Patient's most recent lab result is older than the maximum 21 days allowed.).  COAG Lab Results  Component Value Date   INR 1.00 05/26/2015   INR 0.98 05/16/2015    Radiology No results found.   Assessment/Plan There  are no diagnoses linked to this encounter.   Hortencia Pilar, MD  11/27/2021 8:29 AM

## 2021-11-28 ENCOUNTER — Other Ambulatory Visit (HOSPITAL_COMMUNITY): Payer: Self-pay

## 2021-11-28 MED ORDER — OMEPRAZOLE 40 MG PO CPDR
DELAYED_RELEASE_CAPSULE | ORAL | 0 refills | Status: DC
  Filled 2021-11-28: qty 30, 30d supply, fill #0

## 2021-11-30 DIAGNOSIS — M1612 Unilateral primary osteoarthritis, left hip: Secondary | ICD-10-CM | POA: Diagnosis not present

## 2021-12-03 ENCOUNTER — Encounter (INDEPENDENT_AMBULATORY_CARE_PROVIDER_SITE_OTHER): Payer: Self-pay | Admitting: Vascular Surgery

## 2021-12-06 ENCOUNTER — Other Ambulatory Visit (HOSPITAL_COMMUNITY): Payer: Self-pay

## 2021-12-06 DIAGNOSIS — E782 Mixed hyperlipidemia: Secondary | ICD-10-CM | POA: Diagnosis not present

## 2021-12-06 DIAGNOSIS — Z125 Encounter for screening for malignant neoplasm of prostate: Secondary | ICD-10-CM | POA: Diagnosis not present

## 2021-12-06 DIAGNOSIS — R7309 Other abnormal glucose: Secondary | ICD-10-CM | POA: Diagnosis not present

## 2021-12-06 DIAGNOSIS — Z79899 Other long term (current) drug therapy: Secondary | ICD-10-CM | POA: Diagnosis not present

## 2021-12-06 DIAGNOSIS — I10 Essential (primary) hypertension: Secondary | ICD-10-CM | POA: Diagnosis not present

## 2021-12-06 MED ORDER — METOPROLOL SUCCINATE ER 25 MG PO TB24
25.0000 mg | ORAL_TABLET | Freq: Every day | ORAL | 3 refills | Status: DC
  Filled 2021-12-06: qty 90, 90d supply, fill #0
  Filled 2022-03-31: qty 90, 90d supply, fill #1
  Filled 2022-06-24: qty 90, 90d supply, fill #2
  Filled 2022-09-18 – 2022-09-24 (×2): qty 90, 90d supply, fill #3

## 2021-12-06 MED ORDER — OMEPRAZOLE 40 MG PO CPDR
DELAYED_RELEASE_CAPSULE | ORAL | 3 refills | Status: DC
  Filled 2021-12-06 – 2021-12-31 (×2): qty 90, 90d supply, fill #0
  Filled 2022-03-31: qty 90, 90d supply, fill #1
  Filled 2022-06-24: qty 90, 90d supply, fill #2
  Filled 2022-09-18 – 2022-09-24 (×2): qty 90, 90d supply, fill #3

## 2021-12-06 MED ORDER — ATORVASTATIN CALCIUM 20 MG PO TABS
ORAL_TABLET | ORAL | 3 refills | Status: DC
  Filled 2021-12-06: qty 90, 90d supply, fill #0
  Filled 2022-03-17: qty 90, 90d supply, fill #1
  Filled 2022-03-31 – 2022-06-24 (×2): qty 90, 90d supply, fill #2
  Filled 2022-09-18 – 2022-09-24 (×2): qty 90, 90d supply, fill #3

## 2022-01-01 ENCOUNTER — Other Ambulatory Visit (HOSPITAL_COMMUNITY): Payer: Self-pay

## 2022-01-18 DIAGNOSIS — M5442 Lumbago with sciatica, left side: Secondary | ICD-10-CM | POA: Diagnosis not present

## 2022-01-18 DIAGNOSIS — M48062 Spinal stenosis, lumbar region with neurogenic claudication: Secondary | ICD-10-CM | POA: Diagnosis not present

## 2022-02-06 NOTE — H&P (Signed)
TOTAL HIP ADMISSION H&P  Patient is admitted for left total hip arthroplasty.  Subjective:  Chief Complaint: Left hip pain  HPI: Johnny Robinson, 56 y.o. male, has a history of pain and functional disability in the left hip due to arthritis and patient has failed non-surgical conservative treatments for greater than 12 weeks to include NSAID's and/or analgesics, corticosteriod injections, and activity modification. Onset of symptoms was gradual, starting  several  years ago with gradually worsening course since that time. The patient noted no past surgery on the left hip. Patient currently rates pain in the left hip at 7 out of 10 with activity. Patient has night pain, worsening of pain with activity and weight bearing, and pain that interfers with activities of daily living. Patient has evidence of  severe bone-on-bone arthritis with a low volume acetabulum and large marginal osteophytes  by imaging studies. This condition presents safety issues increasing the risk of falls. There is no current active infection.  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   Renal infarct ([place]) 05/16/2015    Past Medical History:  Diagnosis Date   Chronic kidney disease 2016   RENAL INFARCT   GERD (gastroesophageal reflux disease)    Hypertension    Reflux    Subdural hematoma 1991    Past Surgical History:  Procedure Laterality Date   BACK SURGERY  11/2017   COLONOSCOPY WITH PROPOFOL N/A 09/02/2018   Procedure: COLONOSCOPY WITH PROPOFOL;  Surgeon: Lucilla Lame, MD;  Location: [facility] ENDOSCOPY;  Service: Endoscopy;  Laterality: N/A;   EXCISION MASS ABDOMINAL Left 05/28/2018   Procedure: EXCISIONAL BIOPSY MASS ABDOMINAL WALL;  Surgeon:  Herbert Pun, MD;  Location: [facility] ORS;  Service: General;  Laterality: Left;   PERIPHERAL VASCULAR CATHETERIZATION Right 05/18/2015   Procedure: Renal Angiography;  Surgeon: Algernon Huxley, MD;  Location: [employer] [facility] CV LAB;  Service: Cardiovascular;  Laterality: Right;   PERIPHERAL VASCULAR CATHETERIZATION Bilateral 05/18/2015   Procedure: Renal Intervention;  Surgeon: Algernon Huxley, MD;  Location: [place] CV LAB;  Service: Cardiovascular;  Laterality: Bilateral;    Prior to Admission medications   Medication Sig Start Date End Date Taking? Authorizing Provider  amLODipine (NORVASC) 5 MG tablet Take 1 tablet (5 mg total) by mouth daily. Patient not taking: Reported on 05/26/2018 06/01/15   Idelle Crouch, MD  apixaban (ELIQUIS) 5 MG TABS tablet Take 1 tablet (5 mg total) by mouth 2 (two) times daily. Patient not taking: Reported on 05/26/2018 05/18/15   Idelle Crouch, MD  aspirin EC 81 MG EC tablet Take 1 tablet (81 mg total) by mouth daily. Patient not taking: Reported on 05/26/2018 06/01/15   Idelle Crouch, MD  atorvastatin (LIPITOR) 20 MG tablet Take 1 tablet (20 mg total) by mouth daily at 6 PM. 06/01/15   Idelle Crouch, MD  atorvastatin (LIPITOR) 20 MG tablet TAKE 1 TABLET BY MOUTH ONCE DAILY 02/24/20 02/23/21  Idelle Crouch, MD  atorvastatin (LIPITOR) 20 MG tablet Take 1 tablet (20 mg total) by mouth once daily 02/27/21     atorvastatin (LIPITOR) 20 MG tablet Take 1 tablet (20 mg total) by mouth once daily 12/06/21     dicyclomine (BENTYL) 20 MG tablet TAKE 1  TABLET BY MOUTH EVERY 6 HOURS AS NEEDED 07/19/20 07/19/21  Idelle Crouch, MD  HYDROcodone-acetaminophen (NORCO) 10-325 MG per tablet Take 1 tablet by mouth every 4 (four) hours as needed for moderate pain. Patient not taking: Reported on 12/16/2017 05/18/15   Idelle Crouch, MD  losartan (COZAAR) 100 MG tablet TAKE 1 TABLET BY MOUTH ONCE DAILY 02/24/20 02/23/21  Idelle Crouch, MD  losartan (COZAAR) 100 MG tablet TAKE  1 TABLET BY MOUTH ONCE DAILY 06/05/21 06/05/22    losartan (COZAAR) 100 MG tablet Take 1 tablet (100 mg total) by mouth once daily 06/05/21     metoprolol succinate (TOPROL-XL) 25 MG 24 hr tablet  11/19/16   [provider]  metoprolol succinate (TOPROL-XL) 25 MG 24 hr tablet TAKE 1 TABLET BY MOUTH ONCE DAILY 02/24/20 02/23/21  Idelle Crouch, MD  metoprolol succinate (TOPROL-XL) 25 MG 24 hr tablet Take 1 tablet (25 mg total) by mouth once daily 02/27/21     metoprolol succinate (TOPROL-XL) 25 MG 24 hr tablet Take 1 tablet (25 mg total) by mouth once daily 12/06/21     metoprolol tartrate (LOPRESSOR) 25 MG tablet Take 1 tablet (25 mg total) by mouth 2 (two) times daily. 06/01/15   Idelle Crouch, MD  naproxen (NAPROSYN) 500 MG tablet Take 1 tablet (500 mg total) by mouth 2 (two) times daily with a meal. Patient not taking: Reported on 05/26/2018 06/01/15   Idelle Crouch, MD  nitroGLYCERIN (NITROSTAT) 0.4 MG SL tablet Place 1 tablet (0.4 mg total) under the tongue every 5 (five) minutes as needed for chest pain. Patient not taking: Reported on 09/02/2018 06/01/15   Idelle Crouch, MD  omeprazole (PRILOSEC) 40 MG capsule TAKE ONE CAPSULE BY MOUTH EVERY MORNING 09/05/15   Flora Lipps, MD  omeprazole (PRILOSEC) 40 MG capsule TAKE 1 CAPSULE BY MOUTH ONCE A DAY 09/17/20 09/17/21  Idelle Crouch, MD  omeprazole (PRILOSEC) 40 MG capsule Take 1 capsule (40 mg total) by mouth once daily 12/06/21     ondansetron (ZOFRAN) 4 MG tablet Take 4 mg by mouth every 8 (eight) hours as needed for nausea or vomiting.    [provider]    No Known Allergies  Social History   Socioeconomic History   Marital status: Married    Spouse name: Not on file   Number of children: Not on file   Years of education: Not on file   Highest education level: Not on file  Occupational History   Not on file  Tobacco Use   Smoking status: Never   Smokeless tobacco: Never  Vaping Use   Vaping Use: Never used   Substance and Sexual Activity   Alcohol use: Yes    Comment: occasional   Drug use: No   Sexual activity: Not on file  Other Topics Concern   Not on file  Social History Narrative   Not on file   Social Determinants of Health   Financial Resource Strain: Not on file  Food Insecurity: Not on file  Transportation Needs: Not on file  Physical Activity: Not on file  Stress: Not on file  Social Connections: Not on file  Intimate Partner Violence: Not on file    Tobacco Use: Low Risk    Smoking Tobacco Use: Never   Smokeless Tobacco Use: Never   Passive Exposure: Not on file   Social History   Substance and Sexual Activity  Alcohol Use Yes   Comment: occasional  Family History  Problem Relation Age of Onset   Nephrolithiasis Mother    Cancer Father     Review of Systems  Constitutional:  Negative for chills and fever.  HENT: Negative.    Eyes: Negative.   Respiratory:  Negative for cough and shortness of breath.   Cardiovascular:  Negative for chest pain and palpitations.  Gastrointestinal:  Negative for abdominal pain, constipation, diarrhea, nausea and vomiting.  Genitourinary:  Negative for dysuria, frequency and urgency.  Musculoskeletal:  Positive for joint pain.  Skin:  Negative for rash.   Objective:  Physical Exam: Well nourished and well developed.  General: Alert and oriented x3, cooperative and pleasant, no acute distress.  Head: normocephalic, atraumatic, neck supple.  Eyes: EOMI.  Abdomen: non-tender to palpation and soft, normoactive bowel sounds. Musculoskeletal: The patient has a significantly antalgic gait pattern favoring the left side without the use of assistive devices.   Right Hip Exam:  The range of motion: normal without discomfort.  There is no tenderness over the greater trochanter bursa.   Left Hip Exam:  The range of motion: Flexion to 100 degrees, Internal Rotation to 0 degrees, External Rotation to 10 degrees, and abduction  to 10 degrees without discomfort.  There is no tenderness over the greater trochanteric bursa.  Calves soft and nontender. Motor function intact in LE. Strength 5/5 LE bilaterally. Neuro: Distal pulses 2+. Sensation to light touch intact in LE.  Vital signs in last 24 hours: BP: ()/()  Arterial Line BP: ()/()   Imaging Review Plain radiographs demonstrate severe degenerative joint disease of the left hip. The bone quality appears to be adequate for age and reported activity level.  Assessment/Plan:  End stage arthritis, left hip  The patient history, physical examination, clinical judgement of the provider and imaging studies are consistent with end stage degenerative joint disease of the left hip and total hip arthroplasty is deemed medically necessary. The treatment options including medical management, injection therapy, arthroscopy and arthroplasty were discussed at length. The risks and benefits of total hip arthroplasty were presented and reviewed. The risks due to aseptic loosening, infection, stiffness, dislocation/subluxation, thromboembolic complications and other imponderables were discussed. The patient acknowledged the explanation, agreed to proceed with the plan and consent was signed. Patient is being admitted for inpatient treatment for surgery, pain control, PT, OT, prophylactic antibiotics, VTE prophylaxis, progressive ambulation and ADLs and discharge planning.The patient is planning to be discharged  home .  Therapy Plans: HEP Disposition: Home with Wife Planned DVT Prophylaxis: Aspirin 325 mg BID DME Needed: None PCP: Fulton Reek, MD (clearance received) Vascular: Hortencia Pilar, MD (clearance in EPIC visit 11/27/2021) TXA: IV Allergies: NKDA Anesthesia Concerns: None BMI: 27.6 Last HgbA1c: n/a  Pharmacy: [facility]  Other: -Per PCP, stop aspirin 3 days prior to surgery  - Patient was instructed on what medications to stop prior to  surgery. - Follow-up visit in 2 weeks with Dr. Wynelle Link - Begin physical therapy following surgery - Pre-operative lab work as pre-surgical testing - Prescriptions will be provided in hospital at time of discharge  R. Jaynie Bream, PA-C Orthopedic Surgery [facility] [place]

## 2022-02-20 DIAGNOSIS — M48062 Spinal stenosis, lumbar region with neurogenic claudication: Secondary | ICD-10-CM | POA: Diagnosis not present

## 2022-02-20 DIAGNOSIS — M5442 Lumbago with sciatica, left side: Secondary | ICD-10-CM | POA: Diagnosis not present

## 2022-02-20 DIAGNOSIS — M25552 Pain in left hip: Secondary | ICD-10-CM | POA: Diagnosis not present

## 2022-02-20 DIAGNOSIS — G8929 Other chronic pain: Secondary | ICD-10-CM | POA: Diagnosis not present

## 2022-02-22 NOTE — Patient Instructions (Addendum)
DUE TO COVID-19 ONLY TWO VISITORS  (aged 54 and older)  ARE ALLOWED TO COME WITH YOU AND STAY IN THE WAITING ROOM ONLY DURING PRE OP AND PROCEDURE.   **NO VISITORS ARE ALLOWED IN THE SHORT STAY AREA OR RECOVERY ROOM!!**  IF YOU WILL BE ADMITTED INTO THE HOSPITAL YOU ARE ALLOWED ONLY FOUR SUPPORT PEOPLE DURING VISITATION HOURS ONLY (7 AM -8PM)   The support person(s) must pass our screening, gel in and out, and wear a mask at all times, including in the patient's room. Patients must also wear a mask when staff or their support person are in the room. Visitors GUEST BADGE MUST BE WORN VISIBLY  One adult visitor may remain with you overnight and MUST be in the room by 8 P.M.     Your procedure is scheduled on: 03/07/22   Report to [facility] Main Entrance    Report to admitting at: 7:30 AM   Call this number if you have problems the morning of surgery [phone]   Do not eat food :After Midnight.   After Midnight you may have the following liquids until : 7:00 AM DAY OF SURGERY  Water Black Coffee (sugar ok, NO MILK/CREAM OR CREAMERS)  Tea (sugar ok, NO MILK/CREAM OR CREAMERS) regular and decaf                             Plain Jell-O (NO RED)                                           Fruit ices (not with fruit pulp, NO RED)                                     Popsicles (NO RED)                                                                  Juice: apple, WHITE grape, WHITE cranberry Sports drinks like Gatorade (NO RED) Clear broth(vegetable,chicken,beef)  Drink G2 drink AT 7:00 AM the day of surgery.       The day of surgery:  Drink ONE (1) Pre-Surgery Clear Ensure or G2 at AM the morning of surgery. Drink in one sitting. Do not sip.  This drink was given to you during your hospital  pre-op appointment visit. Nothing else to drink after completing the  Pre-Surgery Clear Ensure or G2.          If you have questions, please contact your surgeon's office.    Oral  Hygiene is also important to reduce your risk of infection.                                    Remember - BRUSH YOUR TEETH THE MORNING OF SURGERY WITH YOUR REGULAR TOOTHPASTE   Do NOT smoke after Midnight   Take these medicines the morning of surgery with A SIP OF WATER: N/A  DO NOT TAKE ANY ORAL  DIABETIC MEDICATIONS DAY OF YOUR SURGERY  Bring CPAP mask and tubing day of surgery.                              You may not have any metal on your body including hair pins, jewelry, and body piercing             Do not wear lotions, powders, perfumes/cologne, or deodorant              Men may shave face and neck.   Do not bring valuables to the hospital. [place].   Contacts, dentures or bridgework may not be worn into surgery.   Bring small overnight bag day of surgery.   DO NOT [place]. PHARMACY WILL DISPENSE MEDICATIONS LISTED ON YOUR MEDICATION LIST TO YOU DURING YOUR ADMISSION [place]!    Patients discharged on the day of surgery will not be allowed to drive home.  Someone NEEDS to stay with you for the first 24 hours after anesthesia.   Special Instructions: Bring a copy of your healthcare power of attorney and living will documents         the day of surgery if you haven't scanned them before.              Please read over the following fact sheets you were given: IF YOU HAVE QUESTIONS ABOUT YOUR PRE-OP INSTRUCTIONS PLEASE CALL [phone]     [facility] - Preparing for Surgery Before surgery, you can play an important role.  Because skin is not sterile, your skin needs to be as free of germs as possible.  You can reduce the number of germs on your skin by washing with CHG (chlorahexidine gluconate) soap before surgery.  CHG is an antiseptic cleaner which kills germs and bonds with the skin to continue killing germs even after washing. Please DO NOT use if you have an allergy to CHG or  antibacterial soaps.  If your skin becomes reddened/irritated stop using the CHG and inform your nurse when you arrive at Short Stay. Do not shave (including legs and underarms) for at least 48 hours prior to the first CHG shower.  You may shave your face/neck. Please follow these instructions carefully:  1.  Shower with CHG Soap the night before surgery and the  morning of Surgery.  2.  If you choose to wash your hair, wash your hair first as usual with your  normal  shampoo.  3.  After you shampoo, rinse your hair and body thoroughly to remove the  shampoo.                           4.  Use CHG as you would any other liquid soap.  You can apply chg directly  to the skin and wash                       Gently with a scrungie or clean washcloth.  5.  Apply the CHG Soap to your body ONLY FROM THE NECK DOWN.   Do not use on face/ open  Wound or open sores. Avoid contact with eyes, ears mouth and genitals (private parts).                       Wash face,  Genitals (private parts) with your normal soap.             6.  Wash thoroughly, paying special attention to the area where your surgery  will be performed.  7.  Thoroughly rinse your body with warm water from the neck down.  8.  DO NOT shower/wash with your normal soap after using and rinsing off  the CHG Soap.                9.  Pat yourself dry with a clean towel.            10.  Wear clean pajamas.            11.  Place clean sheets on your bed the night of your first shower and do not  sleep with pets. Day of Surgery : Do not apply any lotions/deodorants the morning of surgery.  Please wear clean clothes to the hospital/surgery center.  FAILURE TO FOLLOW THESE INSTRUCTIONS MAY RESULT IN THE CANCELLATION OF YOUR SURGERY PATIENT SIGNATURE_________________________________  NURSE SIGNATURE__________________________________  ________________________________________________________________________   Adam [place]ix  An incentive spirometer is a tool that can help keep your lungs clear and active. This tool measures how well you are filling your lungs with each breath. Taking long deep breaths may help reverse or decrease the chance of developing breathing (pulmonary) problems (especially infection) following: A long period of time when you are unable to move or be active. BEFORE THE PROCEDURE  If the spirometer includes an indicator to show your best effort, your nurse or respiratory therapist will set it to a desired goal. If possible, sit up straight or lean slightly forward. Try not to slouch. Hold the incentive spirometer in an upright position. INSTRUCTIONS FOR USE  Sit on the edge of your bed if possible, or sit up as far as you can in bed or on a chair. Hold the incentive spirometer in an upright position. Breathe out normally. Place the mouthpiece in your mouth and seal your lips tightly around it. Breathe in slowly and as deeply as possible, raising the piston or the ball toward the top of the column. Hold your breath for 3-5 seconds or for as long as possible. Allow the piston or ball to fall to the bottom of the column. Remove the mouthpiece from your mouth and breathe out normally. Rest for a few seconds and repeat Steps 1 through 7 at least 10 times every 1-2 hours when you are awake. Take your time and take a few normal breaths between deep breaths. The spirometer may include an indicator to show your best effort. Use the indicator as a goal to work toward during each repetition. After each set of 10 deep breaths, practice coughing to be sure your lungs are clear. If you have an incision (the cut made at the time of surgery), support your incision when coughing by placing a pillow or rolled up towels firmly against it. Once you are able to get out of bed, walk around indoors and cough well. You may stop using the incentive spirometer when instructed by your caregiver.  RISKS AND  COMPLICATIONS Take your time so you do not get dizzy or light-headed. If you are in pain, you may need to take or  ask for pain medication before doing incentive spirometry. It is harder to take a deep breath if you are having pain. AFTER USE Rest and breathe slowly and easily. It can be helpful to keep track of a log of your progress. Your caregiver can provide you with a simple table to help with this. If you are using the spirometer at home, follow these instructions: [place] IF:  You are having difficultly using the spirometer. You have trouble using the spirometer as often as instructed. Your pain medication is not giving enough relief while using the spirometer. You develop fever of 100.5 F (38.1 C) or higher. SEEK IMMEDIATE MEDICAL CARE IF:  You cough up bloody sputum that had not been present before. You develop fever of 102 F (38.9 C) or greater. You develop worsening pain at or near the incision site. MAKE SURE YOU:  Understand these instructions. Will watch your condition. Will get help right away if you are not doing well or get worse. Document Released: 01/14/2007 Document Revised: 11/26/2011 Document Reviewed: 03/17/2007 [facility] Patient Information 2014 [place], [place].   ________________________________________________________________________

## 2022-02-26 ENCOUNTER — Encounter (HOSPITAL_COMMUNITY): Payer: Self-pay

## 2022-02-26 ENCOUNTER — Other Ambulatory Visit: Payer: Self-pay

## 2022-02-26 ENCOUNTER — Encounter (HOSPITAL_COMMUNITY)
Admission: RE | Admit: 2022-02-26 | Discharge: 2022-02-26 | Disposition: A | Discharge status: Home / Self Care | Payer: 59 | Source: Ambulatory Visit | Attending: Orthopedic Surgery | Admitting: Orthopedic Surgery

## 2022-02-26 VITALS — BP 110/74 | HR 80 | Temp 98.1°F | Ht 73.0 in | Wt 205.0 lb

## 2022-02-26 DIAGNOSIS — Z01818 Encounter for other preprocedural examination: Secondary | ICD-10-CM | POA: Insufficient documentation

## 2022-02-26 DIAGNOSIS — I10 Essential (primary) hypertension: Secondary | ICD-10-CM | POA: Insufficient documentation

## 2022-02-26 DIAGNOSIS — R7303 Prediabetes: Secondary | ICD-10-CM | POA: Insufficient documentation

## 2022-02-26 HISTORY — DX: Unspecified osteoarthritis, unspecified site: M19.90

## 2022-02-26 LAB — BASIC METABOLIC PANEL
Anion gap: 7 (ref 5–15)
BUN: 20 mg/dL (ref 6–20)
CO2: 26 mmol/L (ref 22–32)
Calcium: 9.3 mg/dL (ref 8.9–10.3)
Chloride: 105 mmol/L (ref 98–111)
Creatinine, Ser: 1.23 mg/dL (ref 0.61–1.24)
GFR, Estimated: >60 mL/min (ref >60)
Glucose, Bld: 106 mg/dL — ABNORMAL HIGH (ref 70–99)
Potassium: 3.9 mmol/L (ref 3.5–5.1)
Sodium: 138 mmol/L (ref 135–145)

## 2022-02-26 LAB — CBC
HCT: 49 % (ref 39.0–52.0)
Hemoglobin: 17.3 g/dL — ABNORMAL HIGH (ref 13.0–17.0)
MCH: 32.8 pg (ref 26.0–34.0)
MCHC: 35.3 g/dL (ref 30.0–36.0)
MCV: 93 fL (ref 80.0–100.0)
Platelets: 218 10*3/uL (ref 150–400)
RBC: 5.27 MIL/uL (ref 4.22–5.81)
RDW: 11.8 % (ref 11.5–15.5)
WBC: 8.4 10*3/uL (ref 4.0–10.5)
nRBC: 0 % (ref 0.0–0.2)

## 2022-02-26 LAB — SURGICAL PCR SCREEN
MRSA, PCR: NEGATIVE
Staphylococcus aureus: NEGATIVE

## 2022-02-26 NOTE — Progress Notes (Addendum)
For Short Stay: [place] appointment date: Date of COVID positive in last 23 days:  Bowel Prep reminder:   For Anesthesia: PCP - Dr. Fulton Reek. LOV: 12/06/21 : Clearance. Cardiologist -   Chest x-ray -  EKG -  Stress Test -  ECHO - 05/31/15 Cardiac Cath -  Pacemaker/ICD device last checked: Pacemaker orders received: Device Rep notified:  Spinal Cord Stimulator:  Sleep Study -  CPAP -   Fasting Blood Sugar -  Checks Blood Sugar _____ times a day Date and result of last Hgb A1c-5.9: 12/06/21  Blood Thinner Instructions: Dr. Wynelle Link Aspirin Instructions: 3 days before surgery Last Dose:  Activity level: Can go up a flight of stairs and activities of daily living without stopping and without chest pain and/or shortness of breath   Able to exercise without chest pain and/or shortness of breath   Unable to go up a flight of stairs without chest pain and/or shortness of breath     Anesthesia review: Hx: CKD,HTN  Patient denies shortness of breath, fever, cough and chest pain at PAT appointment   Patient verbalized understanding of instructions that were given to them at the PAT appointment. Patient was also instructed that they will need to review over the PAT instructions again at home before surgery.

## 2022-03-06 NOTE — Anesthesia Preprocedure Evaluation (Signed)
Anesthesia Evaluation  Patient identified by MRN, date of birth, ID band Patient awake    Reviewed: Allergy & Precautions, NPO status , Patient's Chart, lab work & pertinent test results  Airway Mallampati: IV  TM Distance: >3 FB Neck ROM: Full    Dental no notable dental hx.    Pulmonary neg pulmonary ROS,    Pulmonary exam normal        Cardiovascular hypertension, Pt. on medications and Pt. on home beta blockers Normal cardiovascular exam     Neuro/Psych negative neurological ROS  negative psych ROS   GI/Hepatic Neg liver ROS, GERD  Medicated and Controlled,  Endo/Other  negative endocrine ROS  Renal/GU Renal disease     Musculoskeletal  (+) Arthritis , Back surgery x 3-4 times   Abdominal   Peds  Hematology negative hematology ROS (+)   Anesthesia Other Findings left hip osteoarthritis  Reproductive/Obstetrics                            Anesthesia Physical Anesthesia Plan  ASA: 2  Anesthesia Plan: Spinal   Post-op Pain Management:    Induction: Intravenous  PONV Risk Score and Plan: 1 and Ondansetron, Dexamethasone, Midazolam, Treatment may vary due to age or medical condition and Propofol infusion  Airway Management Planned: Simple Face Mask  Additional Equipment:   Intra-op Plan:   Post-operative Plan:   Informed Consent: I have reviewed the patients History and Physical, chart, labs and discussed the procedure including the risks, benefits and alternatives for the proposed anesthesia with the patient or authorized representative who has indicated his/her understanding and acceptance.     Dental advisory given  Plan Discussed with: CRNA  Anesthesia Plan Comments:         Anesthesia Quick Evaluation

## 2022-03-07 ENCOUNTER — Other Ambulatory Visit: Payer: Self-pay

## 2022-03-07 ENCOUNTER — Ambulatory Visit (HOSPITAL_BASED_OUTPATIENT_CLINIC_OR_DEPARTMENT_OTHER): Payer: 59 | Admitting: Anesthesiology

## 2022-03-07 ENCOUNTER — Encounter (HOSPITAL_COMMUNITY)
Admission: RE | Disposition: A | Discharge status: Home / Self Care | Payer: Self-pay | Source: Ambulatory Visit | Attending: Orthopedic Surgery

## 2022-03-07 ENCOUNTER — Observation Stay (HOSPITAL_COMMUNITY): Payer: 59

## 2022-03-07 ENCOUNTER — Encounter (HOSPITAL_COMMUNITY): Payer: Self-pay | Admitting: Orthopedic Surgery

## 2022-03-07 ENCOUNTER — Ambulatory Visit (HOSPITAL_COMMUNITY): Payer: 59 | Admitting: Anesthesiology

## 2022-03-07 ENCOUNTER — Ambulatory Visit (HOSPITAL_COMMUNITY): Payer: 59

## 2022-03-07 ENCOUNTER — Observation Stay (HOSPITAL_COMMUNITY)
Admission: RE | Admit: 2022-03-07 | Discharge: 2022-03-08 | Disposition: A | Discharge status: Home / Self Care | Payer: 59 | Source: Ambulatory Visit | Attending: Orthopedic Surgery | Admitting: Orthopedic Surgery

## 2022-03-07 DIAGNOSIS — Z79899 Other long term (current) drug therapy: Secondary | ICD-10-CM | POA: Diagnosis not present

## 2022-03-07 DIAGNOSIS — M1612 Unilateral primary osteoarthritis, left hip: Secondary | ICD-10-CM | POA: Diagnosis not present

## 2022-03-07 DIAGNOSIS — Z7901 Long term (current) use of anticoagulants: Secondary | ICD-10-CM | POA: Insufficient documentation

## 2022-03-07 DIAGNOSIS — M169 Osteoarthritis of hip, unspecified: Secondary | ICD-10-CM | POA: Diagnosis present

## 2022-03-07 DIAGNOSIS — Z7982 Long term (current) use of aspirin: Secondary | ICD-10-CM | POA: Insufficient documentation

## 2022-03-07 DIAGNOSIS — I129 Hypertensive chronic kidney disease with stage 1 through stage 4 chronic kidney disease, or unspecified chronic kidney disease: Secondary | ICD-10-CM | POA: Diagnosis not present

## 2022-03-07 DIAGNOSIS — Z96642 Presence of left artificial hip joint: Secondary | ICD-10-CM | POA: Diagnosis not present

## 2022-03-07 DIAGNOSIS — Z471 Aftercare following joint replacement surgery: Secondary | ICD-10-CM | POA: Diagnosis not present

## 2022-03-07 DIAGNOSIS — N189 Chronic kidney disease, unspecified: Secondary | ICD-10-CM | POA: Diagnosis not present

## 2022-03-07 LAB — ABO/RH: ABO/RH(D): O POS

## 2022-03-07 LAB — TYPE AND SCREEN
ABO/RH(D): O POS
Antibody Screen: NEGATIVE

## 2022-03-07 SURGERY — ARTHROPLASTY, HIP, TOTAL, ANTERIOR APPROACH
Anesthesia: Spinal | Site: Hip | Laterality: Left

## 2022-03-07 MED ORDER — FENTANYL CITRATE PF 50 MCG/ML IJ SOSY: 25.0000 ug | PREFILLED_SYRINGE | INTRAMUSCULAR | Status: DC | PRN

## 2022-03-07 MED ORDER — ORAL CARE MOUTH RINSE: 15.0000 mL | Freq: Once | OROMUCOSAL | Status: AC

## 2022-03-07 MED ORDER — PHENOL 1.4 % MT LIQD: 1.0000 | OROMUCOSAL | Status: DC | PRN

## 2022-03-07 MED ORDER — ONDANSETRON HCL 4 MG PO TABS: 4.0000 mg | ORAL_TABLET | Freq: Four times a day (QID) | ORAL | Status: DC | PRN

## 2022-03-07 MED ORDER — ATORVASTATIN CALCIUM 20 MG PO TABS
20.0000 mg | ORAL_TABLET | Freq: Every day | ORAL | Status: DC
  Administered 2022-03-08: 20 mg via ORAL
  Filled 2022-03-07: qty 1

## 2022-03-07 MED ORDER — ACETAMINOPHEN 10 MG/ML IV SOLN
1000.0000 mg | Freq: Four times a day (QID) | INTRAVENOUS | Status: DC
  Administered 2022-03-07: 1000 mg via INTRAVENOUS
  Filled 2022-03-07: qty 100

## 2022-03-07 MED ORDER — CEFAZOLIN SODIUM-DEXTROSE 2-4 GM/100ML-% IV SOLN
2.0000 g | INTRAVENOUS | Status: AC
  Administered 2022-03-07: 2 g via INTRAVENOUS
  Filled 2022-03-07: qty 100

## 2022-03-07 MED ORDER — HYDROCODONE-ACETAMINOPHEN 5-325 MG PO TABS: 1.0000 | ORAL_TABLET | ORAL | Status: DC | PRN

## 2022-03-07 MED ORDER — POVIDONE-IODINE 10 % EX SWAB
2.0000 | Freq: Once | CUTANEOUS | Status: AC
  Administered 2022-03-07: 2 via TOPICAL

## 2022-03-07 MED ORDER — MORPHINE SULFATE (PF) 2 MG/ML IV SOLN: 0.5000 mg | INTRAVENOUS | Status: DC | PRN

## 2022-03-07 MED ORDER — METHOCARBAMOL 500 MG PO TABS
500.0000 mg | ORAL_TABLET | Freq: Four times a day (QID) | ORAL | Status: DC | PRN
  Administered 2022-03-07 – 2022-03-08 (×3): 500 mg via ORAL
  Filled 2022-03-07 (×3): qty 1

## 2022-03-07 MED ORDER — MIDAZOLAM HCL 2 MG/2ML IJ SOLN
INTRAMUSCULAR | Status: AC
  Filled 2022-03-07: qty 2

## 2022-03-07 MED ORDER — BUPIVACAINE-EPINEPHRINE (PF) 0.25% -1:200000 IJ SOLN
INTRAMUSCULAR | Status: DC | PRN
  Administered 2022-03-07: 30 mL

## 2022-03-07 MED ORDER — TRAMADOL HCL 50 MG PO TABS
50.0000 mg | ORAL_TABLET | Freq: Four times a day (QID) | ORAL | Status: DC | PRN
  Administered 2022-03-07: 50 mg via ORAL
  Administered 2022-03-08 (×3): 100 mg via ORAL
  Filled 2022-03-07 (×2): qty 2
  Filled 2022-03-07: qty 1
  Filled 2022-03-07: qty 2

## 2022-03-07 MED ORDER — MIDAZOLAM HCL 2 MG/2ML IJ SOLN
INTRAMUSCULAR | Status: DC | PRN
  Administered 2022-03-07: 2 mg via INTRAVENOUS

## 2022-03-07 MED ORDER — DEXAMETHASONE SODIUM PHOSPHATE 10 MG/ML IJ SOLN
10.0000 mg | Freq: Once | INTRAMUSCULAR | Status: AC
  Administered 2022-03-08: 10 mg via INTRAVENOUS
  Filled 2022-03-07: qty 1

## 2022-03-07 MED ORDER — 0.9 % SODIUM CHLORIDE (POUR BTL) OPTIME
TOPICAL | Status: DC | PRN
  Administered 2022-03-07: 1000 mL

## 2022-03-07 MED ORDER — CHLORHEXIDINE GLUCONATE 0.12 % MT SOLN
15.0000 mL | Freq: Once | OROMUCOSAL | Status: AC
  Administered 2022-03-07: 15 mL via OROMUCOSAL

## 2022-03-07 MED ORDER — POLYETHYLENE GLYCOL 3350 17 G PO PACK: 17.0000 g | PACK | Freq: Every day | ORAL | Status: DC | PRN

## 2022-03-07 MED ORDER — HYDROCODONE-ACETAMINOPHEN 7.5-325 MG PO TABS
1.0000 | ORAL_TABLET | ORAL | Status: DC | PRN
  Administered 2022-03-07 – 2022-03-08 (×4): 2 via ORAL
  Filled 2022-03-07 (×5): qty 2

## 2022-03-07 MED ORDER — WATER FOR IRRIGATION, STERILE IR SOLN
Status: DC | PRN
  Administered 2022-03-07: 2000 mL

## 2022-03-07 MED ORDER — PROPOFOL 1000 MG/100ML IV EMUL
INTRAVENOUS | Status: AC
  Filled 2022-03-07: qty 100

## 2022-03-07 MED ORDER — METHOCARBAMOL 500 MG IVPB - SIMPLE MED: 500.0000 mg | Freq: Four times a day (QID) | INTRAVENOUS | Status: DC | PRN

## 2022-03-07 MED ORDER — AMISULPRIDE (ANTIEMETIC) 5 MG/2ML IV SOLN
10.0000 mg | Freq: Once | INTRAVENOUS | Status: DC | PRN
Start: 2022-03-07 — End: 2022-03-07

## 2022-03-07 MED ORDER — MENTHOL 3 MG MT LOZG: 1.0000 | LOZENGE | OROMUCOSAL | Status: DC | PRN

## 2022-03-07 MED ORDER — ASPIRIN 325 MG PO TBEC
325.0000 mg | DELAYED_RELEASE_TABLET | Freq: Two times a day (BID) | ORAL | Status: DC
  Administered 2022-03-08: 325 mg via ORAL
  Filled 2022-03-07: qty 1

## 2022-03-07 MED ORDER — LOSARTAN POTASSIUM 50 MG PO TABS
100.0000 mg | ORAL_TABLET | Freq: Every day | ORAL | Status: DC
  Administered 2022-03-08: 100 mg via ORAL
  Filled 2022-03-07: qty 2

## 2022-03-07 MED ORDER — METOCLOPRAMIDE HCL 5 MG/ML IJ SOLN: 5.0000 mg | Freq: Three times a day (TID) | INTRAMUSCULAR | Status: DC | PRN

## 2022-03-07 MED ORDER — BUPIVACAINE-EPINEPHRINE (PF) 0.25% -1:200000 IJ SOLN
INTRAMUSCULAR | Status: AC
  Filled 2022-03-07: qty 30

## 2022-03-07 MED ORDER — LACTATED RINGERS IV SOLN: INTRAVENOUS | Status: DC

## 2022-03-07 MED ORDER — PROPOFOL 10 MG/ML IV BOLUS
INTRAVENOUS | Status: DC | PRN
  Administered 2022-03-07: 30 mg via INTRAVENOUS

## 2022-03-07 MED ORDER — BISACODYL 10 MG RE SUPP: 10.0000 mg | Freq: Every day | RECTAL | Status: DC | PRN

## 2022-03-07 MED ORDER — DOCUSATE SODIUM 100 MG PO CAPS
100.0000 mg | ORAL_CAPSULE | Freq: Two times a day (BID) | ORAL | Status: DC
  Administered 2022-03-07 – 2022-03-08 (×2): 100 mg via ORAL
  Filled 2022-03-07 (×2): qty 1

## 2022-03-07 MED ORDER — DEXAMETHASONE SODIUM PHOSPHATE 10 MG/ML IJ SOLN
8.0000 mg | Freq: Once | INTRAMUSCULAR | Status: AC
  Administered 2022-03-07: 8 mg via INTRAVENOUS

## 2022-03-07 MED ORDER — PROPOFOL 500 MG/50ML IV EMUL
INTRAVENOUS | Status: DC | PRN
  Administered 2022-03-07: 100 ug/kg/min via INTRAVENOUS

## 2022-03-07 MED ORDER — ONDANSETRON HCL 4 MG/2ML IJ SOLN: 4.0000 mg | Freq: Four times a day (QID) | INTRAMUSCULAR | Status: DC | PRN

## 2022-03-07 MED ORDER — OXYCODONE HCL 5 MG/5ML PO SOLN: 5.0000 mg | Freq: Once | ORAL | Status: DC | PRN

## 2022-03-07 MED ORDER — ACETAMINOPHEN 325 MG PO TABS: 325.0000 mg | ORAL_TABLET | Freq: Four times a day (QID) | ORAL | Status: DC | PRN

## 2022-03-07 MED ORDER — BUPIVACAINE IN DEXTROSE 0.75-8.25 % IT SOLN
INTRATHECAL | Status: DC | PRN
  Administered 2022-03-07: 2 mL via INTRATHECAL

## 2022-03-07 MED ORDER — METOCLOPRAMIDE HCL 5 MG PO TABS: 5.0000 mg | ORAL_TABLET | Freq: Three times a day (TID) | ORAL | Status: DC | PRN

## 2022-03-07 MED ORDER — PANTOPRAZOLE SODIUM 40 MG PO TBEC
40.0000 mg | DELAYED_RELEASE_TABLET | Freq: Every day | ORAL | Status: DC
  Administered 2022-03-07 – 2022-03-08 (×2): 40 mg via ORAL
  Filled 2022-03-07 (×2): qty 1

## 2022-03-07 MED ORDER — METOPROLOL SUCCINATE ER 25 MG PO TB24
25.0000 mg | ORAL_TABLET | Freq: Every day | ORAL | Status: DC
  Administered 2022-03-07 – 2022-03-08 (×2): 25 mg via ORAL
  Filled 2022-03-07 (×2): qty 1

## 2022-03-07 MED ORDER — TRANEXAMIC ACID-NACL 1000-0.7 MG/100ML-% IV SOLN
1000.0000 mg | INTRAVENOUS | Status: AC
  Administered 2022-03-07: 1000 mg via INTRAVENOUS
  Filled 2022-03-07: qty 100

## 2022-03-07 MED ORDER — OXYCODONE HCL 5 MG PO TABS: 5.0000 mg | ORAL_TABLET | Freq: Once | ORAL | Status: DC | PRN

## 2022-03-07 MED ORDER — CEFAZOLIN SODIUM-DEXTROSE 2-4 GM/100ML-% IV SOLN
2.0000 g | Freq: Four times a day (QID) | INTRAVENOUS | Status: AC
  Administered 2022-03-07 (×2): 2 g via INTRAVENOUS
  Filled 2022-03-07 (×2): qty 100

## 2022-03-07 MED ORDER — SODIUM CHLORIDE 0.9 % IV SOLN: INTRAVENOUS | Status: DC

## 2022-03-07 SURGICAL SUPPLY — 44 items
BAG COUNTER SPONGE SURGICOUNT (BAG) ×1 IMPLANT
BAG DECANTER FOR FLEXI CONT (MISCELLANEOUS) IMPLANT
BAG ZIPLOCK 12X15 (MISCELLANEOUS) ×1 IMPLANT
BLADE SAG 18X100X1.27 (BLADE) ×2 IMPLANT
COVER PERINEAL POST (MISCELLANEOUS) ×2 IMPLANT
COVER SURGICAL LIGHT HANDLE (MISCELLANEOUS) ×2 IMPLANT
CUP ACETBLR 54 OD PINNACLE (Hips) ×1 IMPLANT
DRAPE FOOT SWITCH (DRAPES) ×2 IMPLANT
DRAPE STERI IOBAN 125X83 (DRAPES) ×2 IMPLANT
DRAPE U-SHAPE 47X51 STRL (DRAPES) ×4 IMPLANT
DRSG AQUACEL AG ADV 3.5X10 (GAUZE/BANDAGES/DRESSINGS) ×2 IMPLANT
DURAPREP 26ML APPLICATOR (WOUND CARE) ×2 IMPLANT
ELECT REM PT RETURN 15FT ADLT (MISCELLANEOUS) ×2 IMPLANT
GLOVE BIO SURGEON STRL SZ 6.5 (GLOVE) IMPLANT
GLOVE BIO SURGEON STRL SZ7.5 (GLOVE) ×1 IMPLANT
GLOVE BIO SURGEON STRL SZ8 (GLOVE) ×2 IMPLANT
GLOVE BIOGEL PI IND STRL 6.5 (GLOVE) IMPLANT
GLOVE BIOGEL PI IND STRL 7.0 (GLOVE) IMPLANT
GLOVE BIOGEL PI IND STRL 8 (GLOVE) ×1 IMPLANT
GLOVE BIOGEL PI INDICATOR 6.5 (GLOVE)
GLOVE BIOGEL PI INDICATOR 7.0 (GLOVE)
GLOVE BIOGEL PI INDICATOR 8 (GLOVE) ×1
GOWN STRL REUS W/ TWL LRG LVL3 (GOWN DISPOSABLE) ×1 IMPLANT
GOWN STRL REUS W/ TWL XL LVL3 (GOWN DISPOSABLE) IMPLANT
GOWN STRL REUS W/TWL LRG LVL3 (GOWN DISPOSABLE) ×1
GOWN STRL REUS W/TWL XL LVL3 (GOWN DISPOSABLE) ×1
HEAD CERAMIC DELTA 36 PLUS 1.5 (Hips) ×1 IMPLANT
HOLDER FOLEY CATH W/STRAP (MISCELLANEOUS) ×2 IMPLANT
KIT TURNOVER KIT A (KITS) IMPLANT
LINER MARATHON NEUT +4X54X36 (Hips) ×1 IMPLANT
MANIFOLD NEPTUNE II (INSTRUMENTS) ×2 IMPLANT
PACK ANTERIOR HIP CUSTOM (KITS) ×2 IMPLANT
PENCIL SMOKE EVACUATOR COATED (MISCELLANEOUS) ×2 IMPLANT
SPIKE FLUID TRANSFER (MISCELLANEOUS) ×1 IMPLANT
STEM FEMORAL SZ6 HIGH ACTIS (Stem) ×1 IMPLANT
STRIP CLOSURE SKIN 1/2X4 (GAUZE/BANDAGES/DRESSINGS) ×3 IMPLANT
SUT ETHIBOND NAB CT1 #1 30IN (SUTURE) ×2 IMPLANT
SUT MNCRL AB 4-0 PS2 18 (SUTURE) ×2 IMPLANT
SUT STRATAFIX 0 PDS 27 VIOLET (SUTURE) ×2
SUT VIC AB 2-0 CT1 27 (SUTURE) ×2
SUT VIC AB 2-0 CT1 TAPERPNT 27 (SUTURE) ×2 IMPLANT
SUTURE STRATFX 0 PDS 27 VIOLET (SUTURE) ×1 IMPLANT
TRAY FOLEY MTR SLVR 16FR STAT (SET/KITS/TRAYS/PACK) ×2 IMPLANT
TUBE SUCTION HIGH CAP CLEAR NV (SUCTIONS) ×2 IMPLANT

## 2022-03-07 NOTE — Discharge Instructions (Signed)
°Frank Aluisio, MD °Total Joint Specialist °[facility] °[address] °[place],  [address] °[phone] ° °ANTERIOR APPROACH TOTAL HIP REPLACEMENT POSTOPERATIVE DIRECTIONS ° ° ° ° °Hip Rehabilitation, Guidelines Following Surgery  °The results of a hip operation are greatly improved after range of motion and muscle strengthening exercises. Follow all safety measures which are given to protect your hip. If any of these exercises cause increased pain or swelling in your joint, decrease the amount until you are comfortable again. Then slowly increase the exercises. Call your caregiver if you have problems or questions.  ° °BLOOD CLOT PREVENTION °Take a 325 mg Aspirin two times a day for three weeks following surgery. Then take an 81 mg Aspirin once a day for three weeks. Then discontinue Aspirin. °You may resume your vitamins/supplements upon discharge from the hospital. °Do not take any NSAIDs (Advil, Aleve, Ibuprofen, Meloxicam, etc.) until you have discontinued the 325 mg Aspirin. ° °HOME CARE INSTRUCTIONS  °Remove items at home which could result in a fall. This includes throw rugs or furniture in walking pathways.  °ICE to the affected hip as frequently as 20-30 minutes an hour and then as needed for pain and swelling. Continue to use ice on the hip for pain and swelling from surgery. You may notice swelling that will progress down to the foot and ankle. This is normal after surgery. Elevate the leg when you are not up walking on it.   °Continue to use the breathing machine which will help keep your temperature down.  It is common for your temperature to cycle up and down following surgery, especially at night when you are not up moving around and exerting yourself.  The breathing machine keeps your lungs expanded and your temperature down. ° °DIET °You may resume your previous home diet once your are discharged from the hospital. ° °DRESSING / WOUND CARE / SHOWERING °You have  an adhesive waterproof bandage over the incision. Leave this in place until your first follow-up appointment. Once you remove this you will not need to place another bandage.  °You may begin showering 3 days following surgery, but do not submerge the incision under water. ° °ACTIVITY °For the first 3-5 days, it is important to rest and keep the operative leg elevated. You should, as a general rule, rest for 50 minutes and walk/stretch for 10 minutes per hour. After 5 days, you may slowly increase activity as tolerated.  °Perform the exercises you were provided twice a day for about 15-20 minutes each session. Begin these 2 days following surgery. °Walk with your walker as instructed. Use the walker until you are comfortable transitioning to a cane. Walk with the cane in the opposite hand of the operative leg. You may discontinue the cane once you are comfortable and walking steadily. °Avoid periods of inactivity such as sitting longer than an hour when not asleep. This helps prevent blood clots.  °Do not drive a car for 6 weeks or until released by your surgeon.  °Do not drive while taking narcotics. ° °TED HOSE STOCKINGS °Wear the elastic stockings on both legs for three weeks following surgery during the day. You may remove them at night while sleeping. ° °WEIGHT BEARING °Weight bearing as tolerated with assist device (walker, cane, etc) as directed, use it as long as suggested by your surgeon or therapist, typically at least 4-6 weeks. ° °POSTOPERATIVE CONSTIPATION PROTOCOL °Constipation - defined medically as fewer than three stools per week and severe constipation as   less than one stool per week. ° °One of the most common issues patients have following surgery is constipation.  Even if you have a regular bowel pattern at home, your normal regimen is likely to be disrupted due to multiple reasons following surgery.  Combination of anesthesia, postoperative narcotics, change in appetite and fluid intake all can  affect your bowels.  In order to avoid complications following surgery, here are some recommendations in order to help you during your recovery period. ° °Colace (docusate) - Pick up an over-the-counter form of Colace or another stool softener and take twice a day as long as you are requiring postoperative pain medications.  Take with a full glass of water daily.  If you experience loose stools or diarrhea, hold the colace until you stool forms back up.  If your symptoms do not get better within 1 week or if they get worse, check with your doctor. °Dulcolax (bisacodyl) - Pick up over-the-counter and take as directed by the product packaging as needed to assist with the movement of your bowels.  Take with a full glass of water.  Use this product as needed if not relieved by Colace only.  °MiraLax (polyethylene glycol) - Pick up over-the-counter to have on hand.  MiraLax is a solution that will increase the amount of water in your bowels to assist with bowel movements.  Take as directed and can mix with a glass of water, juice, soda, coffee, or tea.  Take if you go more than two days without a movement.Do not use MiraLax more than once per day. Call your doctor if you are still constipated or irregular after using this medication for 7 days in a row. ° °If you continue to have problems with postoperative constipation, please contact the office for further assistance and recommendations.  If you experience "the worst abdominal pain ever" or develop nausea or vomiting, please contact the office immediatly for further recommendations for treatment. ° °ITCHING ° If you experience itching with your medications, try taking only a single pain pill, or even half a pain pill at a time.  You can also use Benadryl over the counter for itching or also to help with sleep.  ° °MEDICATIONS °See your medication summary on the “After Visit Summary” that the nursing staff will review with you prior to discharge.  You may have some home  medications which will be placed on hold until you complete the course of blood thinner medication.  It is important for you to complete the blood thinner medication as prescribed by your surgeon.  Continue your approved medications as instructed at time of discharge. ° °PRECAUTIONS °If you experience chest pain or shortness of breath - call 911 immediately for transfer to the hospital emergency department.  °If you develop a fever greater that 101 F, purulent drainage from wound, increased redness or drainage from wound, foul odor from the wound/dressing, or calf pain - CONTACT YOUR SURGEON.   °                                                °FOLLOW-UP APPOINTMENTS °Make sure you keep all of your appointments after your operation with your surgeon and caregivers. You should call the office at the above phone number and make an appointment for approximately two weeks after the date of your surgery or on the   date instructed by your surgeon outlined in the "After Visit Summary". ° °RANGE OF MOTION AND STRENGTHENING EXERCISES  °These exercises are designed to help you keep full movement of your hip joint. Follow your caregiver's or physical therapist's instructions. Perform all exercises about fifteen times, three times per day or as directed. Exercise both hips, even if you have had only one joint replacement. These exercises can be done on a training (exercise) mat, on the floor, on a table or on a bed. Use whatever works the best and is most comfortable for you. Use music or television while you are exercising so that the exercises are a pleasant break in your day. This will make your life better with the exercises acting as a break in routine you can look forward to.  °Lying on your back, slowly slide your foot toward your buttocks, raising your knee up off the floor. Then slowly slide your foot back down until your leg is straight again.  °Lying on your back spread your legs as far apart as you can without causing  discomfort.  °Lying on your side, raise your upper leg and foot straight up from the floor as far as is comfortable. Slowly lower the leg and repeat.  °Lying on your back, tighten up the muscle in the front of your thigh (quadriceps muscles). You can do this by keeping your leg straight and trying to raise your heel off the floor. This helps strengthen the largest muscle supporting your knee.  °Lying on your back, tighten up the muscles of your buttocks both with the legs straight and with the knee bent at a comfortable angle while keeping your heel on the floor.  ° °POST-OPERATIVE OPIOID TAPER INSTRUCTIONS: °It is important to wean off of your opioid medication as soon as possible. If you do not need pain medication after your surgery it is ok to stop day one. °Opioids include: °Codeine, Hydrocodone(Norco, Vicodin), Oxycodone(Percocet, oxycontin) and hydromorphone amongst others.  °Long term and even short term use of opiods can cause: °Increased pain response °Dependence °Constipation °Depression °Respiratory depression °And more.  °Withdrawal symptoms can include °Flu like symptoms °Nausea, vomiting °And more °Techniques to manage these symptoms °Hydrate well °Eat regular healthy meals °Stay active °Use relaxation techniques(deep breathing, meditating, yoga) °Do Not substitute Alcohol to help with tapering °If you have been on opioids for less than two weeks and do not have pain than it is ok to stop all together.  °Plan to wean off of opioids °This plan should start within one week post op of your joint replacement. °Maintain the same interval or time between taking each dose and first decrease the dose.  °Cut the total daily intake of opioids by one tablet each day °Next start to increase the time between doses. °The last dose that should be eliminated is the evening dose.  ° °IF YOU ARE TRANSFERRED TO A SKILLED REHAB FACILITY °If the patient is transferred to a skilled rehab facility following release from the  hospital, a list of the current medications will be sent to the facility for the patient to continue.  When discharged from the skilled rehab facility, please have the facility set up the patient's Home Health Physical Therapy prior to being released. Also, the skilled facility will be responsible for providing the patient with their medications at time of release from the facility to include their pain medication, the muscle relaxants, and their blood thinner medication. If the patient is still at the rehab facility   at time of the two week follow up appointment, the skilled rehab facility will also need to assist the patient in arranging follow up appointment in our office and any transportation needs. ° °MAKE SURE YOU:  °Understand these instructions.  °Get help right away if you are not doing well or get worse.  ° ° °DENTAL ANTIBIOTICS: ° °In most cases prophylactic antibiotics for Dental procdeures after total joint surgery are not necessary. ° °Exceptions are as follows: ° °1. History of prior total joint infection ° °2. Severely immunocompromised (Organ Transplant, cancer chemotherapy, Rheumatoid biologic °meds such as Humera) ° °3. Poorly controlled diabetes (A1C &gt; 8.0, blood glucose over 200) ° °If you have one of these conditions, contact your surgeon for an antibiotic prescription, prior to your °dental procedure.  ° ° °Pick up stool softner and laxative for home use following surgery while on pain medications. °Do not submerge incision under water. °Please use good hand washing techniques while changing dressing each day. °May shower starting three days after surgery. °Please use a clean towel to pat the incision dry following showers. °Continue to use ice for pain and swelling after surgery. °Do not use any lotions or creams on the incision until instructed by your surgeon. ° °

## 2022-03-07 NOTE — Transfer of Care (Signed)
Immediate Anesthesia Transfer of Care Note  Patient: Johnny Robinson  Procedure(s) Performed: TOTAL HIP ARTHROPLASTY ANTERIOR APPROACH (Left: Hip)  Patient Location: PACU  Anesthesia Type:Spinal  Level of Consciousness: awake and drowsy  Airway & Oxygen Therapy: Patient Spontanous Breathing and Patient connected to face mask oxygen  Post-op Assessment: Report given to RN and Post -op Vital signs reviewed and stable  Post vital signs: Reviewed and stable  Last Vitals:  Vitals Value Taken Time  BP 96/53 03/07/22 1135  Temp    Pulse 80 03/07/22 1138  Resp 11 03/07/22 1138  SpO2 100 % 03/07/22 1138  Vitals shown include unvalidated device data.  Last Pain:  Vitals:   03/07/22 0804  TempSrc: Oral  PainSc:       Patients Stated Pain Goal: 5 (21/11/55 2080)  Complications: No notable events documented.

## 2022-03-07 NOTE — Op Note (Signed)
OPERATIVE REPORT- TOTAL HIP ARTHROPLASTY   PREOPERATIVE DIAGNOSIS: Osteoarthritis of the Left hip.   POSTOPERATIVE DIAGNOSIS: Osteoarthritis of the Left  hip.   PROCEDURE: Left total hip arthroplasty, anterior approach.   SURGEON: Gaynelle Arabian, MD   ASSISTANT: Jaynie Bream, PA-C  ANESTHESIA:  Spinal  ESTIMATED BLOOD LOSS:-450 mL    DRAINS: None  COMPLICATIONS: None   CONDITION: PACU - hemodynamically stable.   BRIEF CLINICAL NOTE: Johnny Robinson is a 56 y.o. male who has advanced end-  stage arthritis of their Left  hip with progressively worsening pain and  dysfunction.The patient has failed nonoperative management and presents for  total hip arthroplasty.   PROCEDURE IN DETAIL: After successful administration of spinal  anesthetic, the traction boots for the [facility] bed were placed on both  feet and the patient was placed onto the [facility] bed, boots placed into the leg  holders. The Left hip was then isolated from the perineum with plastic  drapes and prepped and draped in the usual sterile fashion. ASIS and  greater trochanter were marked and a oblique incision was made, starting  at about 1 cm lateral and 2 cm distal to the ASIS and coursing towards  the anterior cortex of the femur. The skin was cut with a 10 blade  through subcutaneous tissue to the level of the fascia overlying the  tensor fascia lata muscle. The fascia was then incised in line with the  incision at the junction of the anterior third and posterior 2/3rd. The  muscle was teased off the fascia and then the interval between the TFL  and the rectus was developed. The Hohmann retractor was then placed at  the top of the femoral neck over the capsule. The vessels overlying the  capsule were cauterized and the fat on top of the capsule was removed.  A Hohmann retractor was then placed anterior underneath the rectus  femoris to give exposure to the entire anterior capsule. A T-shaped  capsulotomy was  performed. The edges were tagged and the femoral head  was identified.       Osteophytes are removed off the superior acetabulum.  The femoral neck was then cut in situ with an oscillating saw. Traction  was then applied to the left lower extremity utilizing the [facility]  traction. The femoral head was then removed. Retractors were placed  around the acetabulum and then circumferential removal of the labrum was  performed. Osteophytes were also removed. Reaming starts at 51 mm to  medialize and  Increased in 2 mm increments to 53 mm. We reamed in  approximately 40 degrees of abduction, 20 degrees anteversion. A 54 mm  pinnacle acetabular shell was then impacted in anatomic position under  fluoroscopic guidance with excellent purchase. We did not need to place  any additional dome screws. A 36 mm neutral + 4 marathon liner was then  placed into the acetabular shell.       The femoral lift was then placed along the lateral aspect of the femur  just distal to the vastus ridge. The leg was  externally rotated and capsule  was stripped off the inferior aspect of the femoral neck down to the  level of the lesser trochanter, this was done with electrocautery. The femur was lifted after this was performed. The  leg was then placed in an extended and adducted position essentially delivering the femur. We also removed the capsule superiorly and the piriformis from the piriformis fossa to  gain excellent exposure of the  proximal femur. Rongeur was used to remove some cancellous bone to get  into the lateral portion of the proximal femur for placement of the  initial starter reamer. The starter broaches was placed  the starter broach  and was shown to go down the center of the canal. Broaching  with the Actis system was then performed starting at size 0  coursing  Up to size 6. A size 6 had excellent torsional and rotational  and axial stability. The trial high offset neck was then placed  with a 36 + 1.5  trial head. The hip was then reduced. We confirmed that  the stem was in the canal both on AP and lateral x-rays. It also has excellent sizing. The hip was reduced with outstanding stability through full extension and full external rotation.. AP pelvis was taken and the leg lengths were measured and found to be equal. Hip was then dislocated again and the femoral head and neck removed. The  femoral broach was removed. Size 6 Actis stem with a high offset  neck was then impacted into the femur following native anteversion. Has  excellent purchase in the canal. Excellent torsional and rotational and  axial stability. It is confirmed to be in the canal on AP and lateral  fluoroscopic views. The 36 + 1.5 ceramic head was placed and the hip  reduced with outstanding stability. Again AP pelvis was taken and it  confirmed that the leg lengths were equal. The wound was then copiously  irrigated with saline solution and the capsule reattached and repaired  with Ethibond suture. 30 ml of .25% Bupivicaine was  injected into the capsule and into the edge of the tensor fascia lata as well as subcutaneous tissue. The fascia overlying the tensor fascia lata was then closed with a running #1 V-Loc. Subcu was closed with interrupted 2-0 Vicryl and subcuticular running 4-0 Monocryl. Incision was cleaned  and dried. Steri-Strips and a bulky sterile dressing applied. The patient was awakened and transported to  recovery in stable condition.        Please note that a surgical assistant was a medical necessity for this procedure to perform it in a safe and expeditious manner. Assistant was necessary to provide appropriate retraction of vital neurovascular structures and to prevent femoral fracture and allow for anatomic placement of the prosthesis.  Gaynelle Arabian, M.D.

## 2022-03-07 NOTE — Anesthesia Procedure Notes (Signed)
Spinal  Patient location during procedure: OR Start time: 03/07/2022 9:45 AM End time: 03/07/2022 9:50 AM Reason for block: surgical anesthesia Staffing Performed: anesthesiologist  Anesthesiologist: Murvin Natal, MD Performed by: Murvin Natal, MD Authorized by: Murvin Natal, MD   Preanesthetic Checklist Completed: patient identified, IV checked, risks and benefits discussed, surgical consent, monitors and equipment checked, pre-op evaluation and timeout performed Spinal Block Patient position: sitting Prep: DuraPrep Patient monitoring: cardiac monitor, continuous pulse ox and blood pressure Approach: midline Location: L3-4 Injection technique: single-shot Needle Needle type: Pencan  Needle gauge: 24 G Needle length: 9 cm Assessment Sensory level: T10 Events: CSF return Additional Notes Functioning IV was confirmed and monitors were applied. Sterile prep and drape, including hand hygiene and sterile gloves were used. The patient was positioned and the spine was prepped. The skin was anesthetized with lidocaine.  Free flow of clear CSF was obtained prior to injecting local anesthetic into the CSF.  The spinal needle aspirated freely following injection.  The needle was carefully withdrawn.  The patient tolerated the procedure well.

## 2022-03-07 NOTE — Evaluation (Signed)
Physical Therapy Evaluation Patient Details Name: Johnny Robinson MRN: [id number] DOB: 1966/06/16 Today's Date: 03/07/2022  History of Present Illness  Pt is a 56yo male presenting s/p L-THA, AA on 03/07/22. PMH: OA, GERD, CKD, GERD, HTN, hx of subdural hematoma.  Clinical Impression  TRISTYN DEMAREST is a 56 y.o. male POD 0 s/p L-THA, AA. Patient reports independence with mobility at baseline. Patient is now limited by functional impairments (see PT problem list below) and requires min guard for transfers, gait deferred as pt unable to clear L foot from floor. Patient instructed in exercise to facilitate ROM and circulation to manage edema. Patient will benefit from continued skilled PT interventions to address impairments and progress towards PLOF. Acute PT will follow to progress mobility and stair training in preparation for safe discharge home.       Recommendations for follow up therapy are one component of a multi-disciplinary discharge planning process, led by the attending physician.  Recommendations may be updated based on patient status, additional functional criteria and insurance authorization.  Follow Up Recommendations Follow physician's recommendations for discharge plan and follow up therapies      Assistance Recommended at Discharge Set up Supervision/Assistance  Patient can return home with the following  A little help with walking and/or transfers;A little help with bathing/dressing/bathroom;Assistance with cooking/housework;Assist for transportation;Help with stairs or ramp for entrance    Equipment Recommendations None recommended by PT  Recommendations for Other Services       Functional Status Assessment Patient has had a recent decline in their functional status and demonstrates the ability to make significant improvements in function in a reasonable and predictable amount of time.     Precautions / Restrictions Precautions Precautions: Fall Restrictions Weight Bearing  Restrictions: No      Mobility  Bed Mobility Overal bed mobility: Needs Assistance Bed Mobility: Supine to Sit     Supine to sit: Min assist     General bed mobility comments: Min assist to bring LLE off bed.    Transfers Overall transfer level: Needs assistance Equipment used: Rolling walker (2 wheels) Transfers: Sit to/from Stand, Bed to chair/wheelchair/BSC Sit to Stand: From elevated surface, Min guard   Step pivot transfers: Min guard       General transfer comment: For safety only. Upon standing, pt unable to lift L foot off floor, reports this was present PTA. Pt able to lift heel and toe separately, but not entirety of foot. Step Pivot: min guard for safety, no physical assist required, pt slid L foot without assistance, heavy BUE use on RW.    Ambulation/Gait               General Gait Details: deferred  Stairs            Wheelchair Mobility    Modified Rankin (Stroke Patients Only)       Balance Overall balance assessment: Needs assistance Sitting-balance support: Feet supported, No upper extremity supported Sitting balance-Leahy Scale: Good     Standing balance support: Reliant on assistive device for balance, During functional activity, Bilateral upper extremity supported Standing balance-Leahy Scale: Poor                               Pertinent Vitals/Pain Pain Assessment Pain Assessment: 0-10 Pain Score: 2  Pain Location: Left hip Pain Descriptors / Indicators: Operative site guarding    Home Living Family/patient expects to be discharged to:: Private  residence Living Arrangements: Spouse/significant other Available Help at Discharge: Family;Available 24 hours/day Type of Home: House Home Access: Stairs to enter Entrance Stairs-Rails: None Entrance Stairs-Number of Steps: 4 Alternate Level Stairs-Number of Steps: 15 Home Layout: Multi-level;Bed/bath upstairs Home Equipment: Conservation officer, nature (2 wheels)       Prior Function Prior Level of Function : Independent/Modified Independent             Mobility Comments: ind ADLs Comments: ind     Hand Dominance        Extremity/Trunk Assessment   Upper Extremity Assessment Upper Extremity Assessment: Overall WFL for tasks assessed    Lower Extremity Assessment Lower Extremity Assessment: RLE deficits/detail;LLE deficits/detail RLE Deficits / Details: MMT ank DF/PF 5/5 RLE Sensation: WNL LLE Deficits / Details: MMT ank DF/PF 5/5 LLE Sensation: WNL    Cervical / Trunk Assessment Cervical / Trunk Assessment: Normal  Communication   Communication: No difficulties  Cognition Arousal/Alertness: Awake/alert Behavior During Therapy: WFL for tasks assessed/performed Overall Cognitive Status: Within Functional Limits for tasks assessed                                          General Comments General comments (skin integrity, edema, etc.): wife shannon present    Exercises Total Joint Exercises Ankle Circles/Pumps: AROM, Both, 20 reps Other Exercises Other Exercises: incentive spirometry x5, pt reached 1772m, VC for slow and controlled   Assessment/Plan    PT Assessment Patient needs continued PT services  PT Problem List Decreased strength;Decreased range of motion;Decreased activity tolerance;Decreased balance;Decreased mobility;Decreased coordination;Decreased knowledge of use of DME;Pain       PT Treatment Interventions DME instruction;Gait training;Stair training;Functional mobility training;Therapeutic activities;Therapeutic exercise;Balance training;Neuromuscular re-education;Patient/family education    PT Goals (Current goals can be found in the Care Plan section)  Acute Rehab PT Goals Patient Stated Goal: get back to activities like walking, cycling PT Goal Formulation: With patient Time For Goal Achievement: 03/14/22 Potential to Achieve Goals: Good    Frequency 7X/week     Co-evaluation                AM-PAC PT "6 Clicks" Mobility  Outcome Measure Help needed turning from your back to your side while in a flat bed without using bedrails?: None Help needed moving from lying on your back to sitting on the side of a flat bed without using bedrails?: A Little Help needed moving to and from a bed to a chair (including a wheelchair)?: A Little Help needed standing up from a chair using your arms (e.g., wheelchair or bedside chair)?: A Little Help needed to walk in hospital room?: A Little Help needed climbing 3-5 steps with a railing? : A Lot 6 Click Score: 18    End of Session Equipment Utilized During Treatment: Gait belt Activity Tolerance: Patient tolerated treatment well;No increased pain Patient left: in chair;with call bell/phone within reach;with chair alarm set;with family/visitor present;with SCD's reapplied Nurse Communication: Mobility status PT Visit Diagnosis: Difficulty in walking, not elsewhere classified (R26.2);Pain Pain - Right/Left: Left Pain - part of body: Hip    Time: 15916-3846PT Time Calculation (min) (ACUTE ONLY): 22 min   Charges:   PT Evaluation $PT Eval Low Complexity: 1 Low          WCoolidge Breeze PT, DPT [facility] Department Office: [phone]: [phone] WCoolidge Breeze6/21/2023, 7:13 PM

## 2022-03-07 NOTE — Interval H&P Note (Signed)
History and Physical Interval Note:  03/07/2022 8:11 AM  Johnny Robinson  has presented today for surgery, with the diagnosis of left hip osteoarthritis.  The various methods of treatment have been discussed with the patient and family. After consideration of risks, benefits and other options for treatment, the patient has consented to  Procedure(s): TOTAL HIP ARTHROPLASTY ANTERIOR APPROACH (Left) as a surgical intervention.  The patient's history has been reviewed, patient examined, no change in status, stable for surgery.  I have reviewed the patient's chart and labs.  Questions were answered to the patient's satisfaction.     Pilar Plate Saleh Ulbrich

## 2022-03-08 ENCOUNTER — Other Ambulatory Visit (HOSPITAL_COMMUNITY): Payer: Self-pay

## 2022-03-08 ENCOUNTER — Encounter (HOSPITAL_COMMUNITY): Payer: Self-pay | Admitting: Orthopedic Surgery

## 2022-03-08 DIAGNOSIS — Z79899 Other long term (current) drug therapy: Secondary | ICD-10-CM | POA: Diagnosis not present

## 2022-03-08 DIAGNOSIS — N189 Chronic kidney disease, unspecified: Secondary | ICD-10-CM | POA: Diagnosis not present

## 2022-03-08 DIAGNOSIS — I129 Hypertensive chronic kidney disease with stage 1 through stage 4 chronic kidney disease, or unspecified chronic kidney disease: Secondary | ICD-10-CM | POA: Diagnosis not present

## 2022-03-08 DIAGNOSIS — M1612 Unilateral primary osteoarthritis, left hip: Secondary | ICD-10-CM | POA: Diagnosis not present

## 2022-03-08 DIAGNOSIS — Z7901 Long term (current) use of anticoagulants: Secondary | ICD-10-CM | POA: Diagnosis not present

## 2022-03-08 DIAGNOSIS — Z7982 Long term (current) use of aspirin: Secondary | ICD-10-CM | POA: Diagnosis not present

## 2022-03-08 LAB — CBC
HCT: 40.5 % (ref 39.0–52.0)
Hemoglobin: 14.2 g/dL (ref 13.0–17.0)
MCH: 33 pg (ref 26.0–34.0)
MCHC: 35.1 g/dL (ref 30.0–36.0)
MCV: 94.2 fL (ref 80.0–100.0)
Platelets: 174 K/uL (ref 150–400)
RBC: 4.3 MIL/uL (ref 4.22–5.81)
RDW: 11.9 % (ref 11.5–15.5)
WBC: 12 K/uL — ABNORMAL HIGH (ref 4.0–10.5)
nRBC: 0 % (ref 0.0–0.2)

## 2022-03-08 LAB — BASIC METABOLIC PANEL
Anion gap: 7 (ref 5–15)
BUN: 14 mg/dL (ref 6–20)
CO2: 27 mmol/L (ref 22–32)
Calcium: 8.5 mg/dL — ABNORMAL LOW (ref 8.9–10.3)
Chloride: 105 mmol/L (ref 98–111)
Creatinine, Ser: 1.1 mg/dL (ref 0.61–1.24)
GFR, Estimated: >60 mL/min (ref >60)
Glucose, Bld: 137 mg/dL — ABNORMAL HIGH (ref 70–99)
Potassium: 3.9 mmol/L (ref 3.5–5.1)
Sodium: 139 mmol/L (ref 135–145)

## 2022-03-08 MED ORDER — METHOCARBAMOL 500 MG PO TABS
500.0000 mg | ORAL_TABLET | Freq: Four times a day (QID) | ORAL | 0 refills | Status: DC | PRN
  Filled 2022-03-08: qty 40, 10d supply, fill #0

## 2022-03-08 MED ORDER — HYDROCODONE-ACETAMINOPHEN 5-325 MG PO TABS
1.0000 | ORAL_TABLET | Freq: Four times a day (QID) | ORAL | 0 refills | Status: DC | PRN
  Filled 2022-03-08: qty 42, 6d supply, fill #0

## 2022-03-08 MED ORDER — TRAMADOL HCL 50 MG PO TABS
50.0000 mg | ORAL_TABLET | Freq: Four times a day (QID) | ORAL | 0 refills | Status: DC | PRN
  Filled 2022-03-08: qty 40, 5d supply, fill #0

## 2022-03-08 MED ORDER — ASPIRIN 325 MG PO TBEC
325.0000 mg | DELAYED_RELEASE_TABLET | Freq: Two times a day (BID) | ORAL | 0 refills | Status: AC
  Filled 2022-03-08: qty 40, 20d supply, fill #0

## 2022-03-08 NOTE — Progress Notes (Signed)
Physical Therapy Treatment Patient Details Name: Johnny Robinson MRN: [id number] DOB: June 14, 1966 Today's Date: 03/08/2022   History of Present Illness Pt is a 56yo male presenting s/p L-THA, AA on 03/07/22. PMH: OA, GERD, CKD, GERD, HTN, hx of subdural hematoma.    PT Comments    Pt seen for first of two visits POD1.  Pt min guard for bed mobility and educated on use of gait belt to self-assist with LLE, pt demonstrated good technique and safe form. Required min guard for transfers, min guard progressed to supervision for ambulation in hallway with RW ~140f. Provided HEP and appropriate scheduled progression, pt completed first set of supine exercises with multimodal cues for form and use of gait belt to self-assist. Provided education on car transfers, pt verbalized understanding. Introduced paced walking program and emphasized the importance of ambulation during recovery from THA, pt verbalized understanding. Pt will benefit from skilled PT to increase their independence and safety with mobility to allow discharge to the venue listed below.     Recommendations for follow up therapy are one component of a multi-disciplinary discharge planning process, led by the attending physician.  Recommendations may be updated based on patient status, additional functional criteria and insurance authorization.  Follow Up Recommendations  Follow physician's recommendations for discharge plan and follow up therapies     Assistance Recommended at Discharge Set up Supervision/Assistance  Patient can return home with the following A little help with walking and/or transfers;A little help with bathing/dressing/bathroom;Assistance with cooking/housework;Assist for transportation;Help with stairs or ramp for entrance   Equipment Recommendations  None recommended by PT    Recommendations for Other Services       Precautions / Restrictions Precautions Precautions: Fall Restrictions Weight Bearing Restrictions:  No LLE Weight Bearing: Weight bearing as tolerated     Mobility  Bed Mobility Overal bed mobility: Needs Assistance Bed Mobility: Supine to Sit     Supine to sit: Min guard     General bed mobility comments: Min guard, educated on use of gait belt to bring LLE off bed, pt completed safely.    Transfers Overall transfer level: Needs assistance Equipment used: Rolling walker (2 wheels) Transfers: Sit to/from Stand Sit to Stand: From elevated surface, Min guard           General transfer comment: For safety only. VCs for powering up through BUE    Ambulation/Gait Ambulation/Gait assistance: Supervision Gait Distance (Feet): 180 Feet Assistive device: Rolling walker (2 wheels) Gait Pattern/deviations: Step-to pattern, Step-through pattern       General Gait Details: Pt ambulated with RW and min guard progressed to supervision, no physical assist required or overt LOB noted. Demonstrated decreased foot clearance and DF on L, pt using "toe scrunch" to advance LLE at start of ambulation but able to utilize closer to heel-toe pattern with improved clearance toward end of ambulation task.   Stairs             Wheelchair Mobility    Modified Rankin (Stroke Patients Only)       Balance Overall balance assessment: Needs assistance Sitting-balance support: Feet supported, No upper extremity supported Sitting balance-Leahy Scale: Good     Standing balance support: Reliant on assistive device for balance, During functional activity, Bilateral upper extremity supported Standing balance-Leahy Scale: Poor                              Cognition Arousal/Alertness: Awake/alert Behavior During  Therapy: WFL for tasks assessed/performed Overall Cognitive Status: Within Functional Limits for tasks assessed                                          Exercises Total Joint Exercises Ankle Circles/Pumps: AROM, Both, 20 reps Quad Sets: AROM,  Both, 10 reps, Seated Short Arc Quad: AROM, Left, 10 reps, Seated Heel Slides: AAROM, Left, 10 reps, Seated Hip ABduction/ADduction: AAROM, Left, 10 reps Long Arc Quad: Other (comment) (educated not performed) Marching in Standing: Other (comment) (educated not performed) Standing Hip Extension: Other (comment) (educated not performed) Other Exercises Other Exercises: Walking program education and progression Other Exercises: Standing hip abduction educated not performed Other Exercises: Standing hamstring curls educated not performed    General Comments General comments (skin integrity, edema, etc.): [name] present      Pertinent Vitals/Pain Pain Assessment Pain Assessment: 0-10 Pain Score: 2  Pain Location: Left hip Pain Descriptors / Indicators: Operative site guarding Pain Intervention(s): Limited activity within patient's tolerance, Monitored during session, Repositioned, Ice applied    Home Living                          Prior Function            PT Goals (current goals can now be found in the care plan section) Acute Rehab PT Goals Patient Stated Goal: get back to activities like walking, cycling PT Goal Formulation: With patient Time For Goal Achievement: 03/14/22 Potential to Achieve Goals: Good Progress towards PT goals: Progressing toward goals    Frequency    7X/week      PT Plan Current plan remains appropriate    Co-evaluation              AM-PAC PT "6 Clicks" Mobility   Outcome Measure  Help needed turning from your back to your side while in a flat bed without using bedrails?: None Help needed moving from lying on your back to sitting on the side of a flat bed without using bedrails?: A Little Help needed moving to and from a bed to a chair (including a wheelchair)?: A Little Help needed standing up from a chair using your arms (e.g., wheelchair or bedside chair)?: A Little Help needed to walk in hospital room?: A  Little Help needed climbing 3-5 steps with a railing? : A Little 6 Click Score: 19    End of Session Equipment Utilized During Treatment: Gait belt Activity Tolerance: Patient tolerated treatment well;No increased pain Patient left: in chair;with call bell/phone within reach;with chair alarm set;with family/visitor present Nurse Communication: Mobility status PT Visit Diagnosis: Difficulty in walking, not elsewhere classified (R26.2);Pain Pain - Right/Left: Left Pain - part of body: Hip     Time: 3419-6222 PT Time Calculation (min) (ACUTE ONLY): 36 min  Charges:  $Gait Training: 8-22 mins $Therapeutic Exercise: 8-22 mins                     Coolidge Breeze, PT, DPT [place] [facility] Office: [phone] Pager: [phone]   Coolidge Breeze 03/08/2022, 12:01 PM

## 2022-03-08 NOTE — Progress Notes (Signed)
Physical Therapy Treatment Patient Details Name: Johnny Robinson MRN: [id number] DOB: 1966-04-03 Today's Date: 03/08/2022   History of Present Illness Pt is a 56yo male presenting s/p L-THA, AA on 03/07/22. PMH: OA, GERD, CKD, GERD, HTN, hx of subdural hematoma.    PT Comments    Pt seen for second visit POD1. Pt supervision for transfers, supervision to modified independent for ambulation in hallway, min guard to min assist for stair training with several techniques based on pt's home set-up. All education has been completed and the patient has no further questions. Pt has mobility goals for safe discharge home. PT is signing off. Thank you for this referral.    Recommendations for follow up therapy are one component of a multi-disciplinary discharge planning process, led by the attending physician.  Recommendations may be updated based on patient status, additional functional criteria and insurance authorization.  Follow Up Recommendations  Follow physician's recommendations for discharge plan and follow up therapies     Assistance Recommended at Discharge Set up Supervision/Assistance  Patient can return home with the following A little help with walking and/or transfers;A little help with bathing/dressing/bathroom;Assistance with cooking/housework;Assist for transportation;Help with stairs or ramp for entrance   Equipment Recommendations  None recommended by PT    Recommendations for Other Services       Precautions / Restrictions Precautions Precautions: Fall Restrictions Weight Bearing Restrictions: No LLE Weight Bearing: Weight bearing as tolerated     Mobility  Bed Mobility Overal bed mobility: Needs Assistance Bed Mobility: Supine to Sit     Supine to sit: Min guard     General bed mobility comments: Pt in recliner at entry and exit    Transfers Overall transfer level: Needs assistance Equipment used: Rolling walker (2 wheels) Transfers: Sit to/from Stand Sit to  Stand: Supervision           General transfer comment: for safety only    Ambulation/Gait Ambulation/Gait assistance: Supervision, Modified independent (Device/Increase time) Gait Distance (Feet): 360 Feet Assistive device: Rolling walker (2 wheels) Gait Pattern/deviations: WFL(Within Functional Limits) Gait velocity: decreased     General Gait Details: Pt ambulated with RW and supervision to modified independence, step-through reciprocal gait pattern, no overt LOB noted.   Stairs Stairs: Yes Stairs assistance: Min guard, Min assist Stair Management: One rail Left, Two rails, Step to pattern, Sideways, Forwards Number of Stairs: 6 General stair comments: Pt educated on two methods of stair mobility: sideways with L rail and forwards with R rail + HHA. Pt verbalized understanding and safe guarding/assist position of caregiver. Completed both techniques with min guard to min assist, no overt LOB. All questions answered   Wheelchair Mobility    Modified Rankin (Stroke Patients Only)       Balance Overall balance assessment: Needs assistance Sitting-balance support: Feet supported, No upper extremity supported Sitting balance-Leahy Scale: Good     Standing balance support: Reliant on assistive device for balance, During functional activity, Bilateral upper extremity supported Standing balance-Leahy Scale: Poor                              Cognition Arousal/Alertness: Awake/alert Behavior During Therapy: WFL for tasks assessed/performed Overall Cognitive Status: Within Functional Limits for tasks assessed  Exercises     General Comments        Pertinent Vitals/Pain Pain Assessment Pain Assessment: 0-10 Pain Score: 2  Pain Location: Left hip Pain Descriptors / Indicators: Operative site guarding Pain Intervention(s): Monitored during session, Repositioned    Home Living                           Prior Function            PT Goals (current goals can now be found in the care plan section) Acute Rehab PT Goals Patient Stated Goal: get back to activities like walking, cycling PT Goal Formulation: With patient Time For Goal Achievement: 03/14/22 Potential to Achieve Goals: Good Progress towards PT goals: Progressing toward goals    Frequency    7X/week      PT Plan Current plan remains appropriate    Co-evaluation              AM-PAC PT "6 Clicks" Mobility   Outcome Measure  Help needed turning from your back to your side while in a flat bed without using bedrails?: None Help needed moving from lying on your back to sitting on the side of a flat bed without using bedrails?: None Help needed moving to and from a bed to a chair (including a wheelchair)?: A Little Help needed standing up from a chair using your arms (e.g., wheelchair or bedside chair)?: A Little Help needed to walk in hospital room?: A Little Help needed climbing 3-5 steps with a railing? : A Little 6 Click Score: 20    End of Session Equipment Utilized During Treatment: Gait belt Activity Tolerance: Patient tolerated treatment well;No increased pain Patient left: with call bell/phone within reach;with family/visitor present;in bed Nurse Communication: Mobility status PT Visit Diagnosis: Difficulty in walking, not elsewhere classified (R26.2);Pain Pain - Right/Left: Left Pain - part of body: Hip     Time: 4585-9292 PT Time Calculation (min) (ACUTE ONLY): 18 min  Charges:  $Gait Training: 8-22 mins                     Coolidge Breeze, PT, DPT [facility] Department Office: [phone] Pager: [phone]    Coolidge Breeze 03/08/2022, 3:35 PM

## 2022-03-08 NOTE — Plan of Care (Signed)

## 2022-03-08 NOTE — Plan of Care (Signed)

## 2022-03-08 NOTE — Anesthesia Postprocedure Evaluation (Signed)
Anesthesia Post Note  Patient: Johnny Robinson  Procedure(s) Performed: TOTAL HIP ARTHROPLASTY ANTERIOR APPROACH (Left: Hip)     Patient location during evaluation: PACU Anesthesia Type: Spinal Level of consciousness: awake Pain management: pain level controlled Vital Signs Assessment: post-procedure vital signs reviewed and stable Respiratory status: spontaneous breathing, nonlabored ventilation, respiratory function stable and patient connected to nasal cannula oxygen Cardiovascular status: stable and blood pressure returned to baseline Postop Assessment: no apparent nausea or vomiting Anesthetic complications: no   No notable events documented.  Last Vitals:  Vitals:   03/08/22 0839 03/08/22 1414  BP: 113/67 122/79  Pulse: 83 75  Resp: 18 16  Temp: 36.9 C 36.5 C  SpO2: 100% 98%    Last Pain:  Vitals:   03/08/22 1414  TempSrc: Oral  PainSc:                  Azariyah Luhrs P Shabazz Mckey

## 2022-03-08 NOTE — Progress Notes (Signed)
   Subjective: 1 Day Post-Op Procedure(s) (LRB): TOTAL HIP ARTHROPLASTY ANTERIOR APPROACH (Left) Patient seen in rounds by Dr. Wynelle Link. Patient is well, and has had no acute complaints or problems. Denies SOB or chest pain. Foley cath removed this AM. Patient reports pain as mild. Worked with physical therapy yesterday on transfers. Will continue therapy today.   Objective: Vital signs in last 24 hours: Temp:  [97.6 F (36.4 C)-98.5 F (36.9 C)] 97.9 F (36.6 C) (06/22 0543) Pulse Rate:  [62-86] 85 (06/22 0543) Resp:  [11-19] 18 (06/22 0543) BP: (88-126)/(53-87) 116/62 (06/22 0543) SpO2:  [95 %-100 %] 96 % (06/22 0543) Weight:  [93 kg] 93 kg (06/21 0758)  Intake/Output from previous day:  Intake/Output Summary (Last 24 hours) at 03/08/2022 0736 Last data filed at 03/08/2022 8242 Gross per 24 hour  Intake 2926.39 ml  Output 2500 ml  Net 426.39 ml     Intake/Output this shift: Total I/O In: 240 [P.O.:240] Out: -   Labs: Recent Labs    03/08/22 0334  HGB 14.2   Recent Labs    03/08/22 0334  WBC 12.0*  RBC 4.30  HCT 40.5  PLT 174   Recent Labs    03/08/22 0334  NA 139  K 3.9  CL 105  CO2 27  BUN 14  CREATININE 1.10  GLUCOSE 137*  CALCIUM 8.5*   No results for input(s): "LABPT", "INR" in the last 72 hours.  Exam: General - Patient is Alert and Oriented Extremity - Neurologically intact Neurovascular intact Sensation intact distally Dorsiflexion/Plantar flexion intact Dressing - dressing C/D/I Motor Function - intact, moving foot and toes well on exam.  Past Medical History:  Diagnosis Date   Arthritis    Chronic kidney disease 2016   RENAL INFARCT   GERD (gastroesophageal reflux disease)    Hypertension    Reflux    Subdural hematoma ([place]) 1991    Assessment/Plan: 1 Day Post-Op Procedure(s) (LRB): TOTAL HIP ARTHROPLASTY ANTERIOR APPROACH (Left) Principal Problem:   OA (osteoarthritis) of hip Active Problems:   Primary osteoarthritis of  left hip  Estimated body mass index is 27.05 kg/m as calculated from the following:   Height as of this encounter: '6\' 1"'$  (1.854 m).   Weight as of this encounter: 93 kg. Advance diet Up with therapy D/C IV fluids  DVT Prophylaxis - Aspirin Weight bearing as tolerated.  Continue physical therapy. Expected discharge today if meeting patient goals.  Plan is to go Home after hospital stay. Will do a HEP once discharged. Follow-up in office in 2 weeks.  The PDMP database was reviewed today prior to any opioid medications being prescribed to this patient.  R. Jaynie Bream, PA-C Orthopedic Surgery [phone] 03/08/2022, 7:36 AM

## 2022-03-08 NOTE — TOC Transition Note (Signed)
Transition of Care [facility] - CM/SW Discharge Note   Patient Details  Name: Johnny Robinson MRN: [id number] Date of Birth: 01-17-1966  Transition of Care [facility]) CM/SW Contact:  Lennart Pall, LCSW Phone Number: 03/08/2022, 10:39 AM   Clinical Narrative:     Met with pt and spouse and confirming he has all needed DME at home.  Plan HEP.  No TOC needs.  Final next level of care: Home/Self Care Barriers to Discharge: No Barriers Identified   Patient Goals and CMS Choice Patient states their goals for this hospitalization and ongoing recovery are:: return home      Discharge Placement                       Discharge Plan and Services                DME Arranged: N/A DME Agency: NA                  Social Determinants of Health (SDOH) Interventions     Readmission Risk Interventions     No data to display

## 2022-03-16 ENCOUNTER — Other Ambulatory Visit: Payer: Self-pay | Admitting: Podiatry

## 2022-03-16 ENCOUNTER — Ambulatory Visit (INDEPENDENT_AMBULATORY_CARE_PROVIDER_SITE_OTHER): Payer: 59

## 2022-03-16 ENCOUNTER — Ambulatory Visit: Payer: 59 | Admitting: Podiatry

## 2022-03-16 DIAGNOSIS — M79672 Pain in left foot: Secondary | ICD-10-CM

## 2022-03-16 DIAGNOSIS — Z79899 Other long term (current) drug therapy: Secondary | ICD-10-CM | POA: Diagnosis not present

## 2022-03-16 DIAGNOSIS — M6740 Ganglion, unspecified site: Secondary | ICD-10-CM

## 2022-03-16 DIAGNOSIS — M898X9 Other specified disorders of bone, unspecified site: Secondary | ICD-10-CM | POA: Diagnosis not present

## 2022-03-17 ENCOUNTER — Other Ambulatory Visit (HOSPITAL_COMMUNITY): Payer: Self-pay

## 2022-03-17 LAB — HEPATIC FUNCTION PANEL
AG Ratio: 1.6 (calc) (ref 1.0–2.5)
ALT: 51 U/L — ABNORMAL HIGH (ref 9–46)
AST: 28 U/L (ref 10–35)
Albumin: 3.9 g/dL (ref 3.6–5.1)
Alkaline phosphatase (APISO): 90 U/L (ref 35–144)
Bilirubin, Direct: 0.2 mg/dL (ref 0.0–0.2)
Globulin: 2.4 g/dL (calc) (ref 1.9–3.7)
Indirect Bilirubin: 0.7 mg/dL (calc) (ref 0.2–1.2)
Total Bilirubin: 0.9 mg/dL (ref 0.2–1.2)
Total Protein: 6.3 g/dL (ref 6.1–8.1)

## 2022-03-19 ENCOUNTER — Other Ambulatory Visit (HOSPITAL_COMMUNITY): Payer: Self-pay

## 2022-03-21 ENCOUNTER — Encounter: Payer: Self-pay | Admitting: Podiatry

## 2022-03-22 ENCOUNTER — Other Ambulatory Visit (HOSPITAL_COMMUNITY): Payer: Self-pay

## 2022-03-22 MED ORDER — METHOCARBAMOL 500 MG PO TABS
ORAL_TABLET | ORAL | 0 refills | Status: DC
  Filled 2022-03-22: qty 40, 10d supply, fill #0

## 2022-03-22 MED ORDER — TRAMADOL HCL 50 MG PO TABS
ORAL_TABLET | ORAL | 0 refills | Status: DC
  Filled 2022-03-22: qty 40, 5d supply, fill #0

## 2022-03-22 NOTE — Progress Notes (Signed)
Subjective:  Patient ID: Johnny Robinson, male    DOB: [birth year],  MRN: [id number]  Chief Complaint  Patient presents with   Foot Pain    56 y.o. male presents with the above complaint.  Patient presents with complaint of left dorsal midfoot exostosis.  Patient states that he has had it removed in the past.  He seems to have grown back it was done about 20 years ago.  He would like to surgically excise it out.  He is tried offloading padding protecting which has helped he has failed all conservative treatment options.  He also has secondary complaint left second and fourth digit onychomycosis for which she will start taking medication such as Lamisil to help improve that.  He does not any liver issues.  He went to get that evaluated   Review of Systems: Negative except as noted in the HPI. Denies N/V/F/Ch.  Past Medical History:  Diagnosis Date   Arthritis    Chronic kidney disease 2016   RENAL INFARCT   GERD (gastroesophageal reflux disease)    Hypertension    Reflux    Subdural hematoma (HCC) 1991    Current Outpatient Medications:    acetaminophen (TYLENOL) 500 MG tablet, Take 500-1,000 mg by mouth every 6 (six) hours as needed (pain)., Disp: , Rfl:    aspirin EC 325 MG tablet, Take 1 tablet (325 mg total) by mouth 2 (two) times daily for 20 days. Then resume an 81 mg aspirin once a day., Disp: 40 tablet, Rfl: 0   atorvastatin (LIPITOR) 20 MG tablet, Take 1 tablet (20 mg total) by mouth once daily (Patient not taking: Reported on 02/19/2022), Disp: 90 tablet, Rfl: 3   atorvastatin (LIPITOR) 20 MG tablet, Take 1 tablet (20 mg total) by mouth once daily, Disp: 90 tablet, Rfl: 3   HYDROcodone-acetaminophen (NORCO/VICODIN) 5-325 MG tablet, Take 1 to 2 tablets by mouth every 6 (six) hours as needed for severe pain., Disp: 42 tablet, Rfl: 0   losartan (COZAAR) 100 MG tablet, TAKE 1 TABLET BY MOUTH ONCE DAILY (Patient not taking: Reported on 02/19/2022), Disp: 90 tablet, Rfl: 3   losartan  (COZAAR) 100 MG tablet, Take 1 tablet (100 mg total) by mouth once daily, Disp: 90 tablet, Rfl: 3   methocarbamol (ROBAXIN) 500 MG tablet, Take 1 tablet (500 mg total) by mouth every 6 (six) hours as needed for muscle spasms., Disp: 40 tablet, Rfl: 0   metoprolol succinate (TOPROL-XL) 25 MG 24 hr tablet, Take 1 tablet (25 mg total) by mouth once daily, Disp: 90 tablet, Rfl: 3   metoprolol succinate (TOPROL-XL) 25 MG 24 hr tablet, Take 1 tablet (25 mg total) by mouth once daily (Patient not taking: Reported on 02/19/2022), Disp: 90 tablet, Rfl: 3   Multiple Vitamin (MULTIVITAMIN WITH MINERALS) TABS tablet, Take 1 tablet by mouth in the morning., Disp: , Rfl:    omeprazole (PRILOSEC) 40 MG capsule, TAKE ONE CAPSULE BY MOUTH EVERY MORNING, Disp: 90 capsule, Rfl: 3   omeprazole (PRILOSEC) 40 MG capsule, Take 1 capsule (40 mg total) by mouth once daily, Disp: 90 capsule, Rfl: 3   traMADol (ULTRAM) 50 MG tablet, Take 1-2 tablets (50-100 mg total) by mouth every 6 (six) hours as needed for moderate pain., Disp: 40 tablet, Rfl: 0  Social History   Tobacco Use  Smoking Status Never  Smokeless Tobacco Never    No Known Allergies Objective:  There were no vitals filed for this visit. There is no height or  weight on file to calculate BMI. Constitutional Well developed. Well nourished.  Vascular Dorsalis pedis pulses palpable bilaterally. Posterior tibial pulses palpable bilaterally. Capillary refill normal to all digits.  No cyanosis or clubbing noted. Pedal hair growth normal.  Neurologic Normal speech. Oriented to person, place, and time. Epicritic sensation to light touch grossly present bilaterally.  Dermatologic Left second and fourth digit thickened elongated dystrophic mycotic nail left x2. No open wounds. No skin lesions.  Orthopedic: Left dorsal midfoot exostosis clinically appreciated pain on palpation to the exostosis.  Some numbness tingling associated with it.  No other bony  abnormality that clinically could be appreciated negative extensor or flexor tendinitis.   Radiographs: 3 views of skeletally mature adult left foot:Left dorsal first tarsometatarsal joint exostosis noted.  Midfoot arthritis noted no other bony abnormalities noted plantar posterior spurring noted Assessment:   1. Exostosis   2. Long-term use of high-risk medication    Plan:  Patient was evaluated and treated and all questions answered.  Left dorsal midfoot exostosis -All questions and concerns were discussed with the patient in extensive detail.  Given that he has failed all conservative treatment options.  Likely will he will benefit from surgical excision of the exostosis.  I discussed with patient he states understanding. -I discussed my preoperative intraoperative postoperative plan in extensive detail.  He can be weightbearing as tolerated surgical shoe after the surgery.  Left second and fourth digit onychomycosis -Educated the patient on the etiology of onychomycosis and various treatment options associated with improving the fungal load.  I explained to the patient that there is 3 treatment options available to treat the onychomycosis including topical, p.o., laser treatment.  Patient elected to undergo p.o. options with Lamisil/terbinafine therapy.  In order for me to start the medication therapy, I explained to the patient the importance of evaluating the liver and obtaining the liver function test.  Once the liver function test comes back normal I will start him on 52-monthcourse of Lamisil therapy.  Patient understood all risk and would like to proceed with Lamisil therapy.  I have asked the patient to immediately stop the Lamisil therapy if she has any reactions to it and call the office or go to the emergency room right away.  Patient states understanding'   No follow-ups on file.

## 2022-03-28 ENCOUNTER — Other Ambulatory Visit (HOSPITAL_COMMUNITY): Payer: Self-pay

## 2022-03-28 DIAGNOSIS — R7989 Other specified abnormal findings of blood chemistry: Secondary | ICD-10-CM | POA: Diagnosis not present

## 2022-03-28 DIAGNOSIS — I10 Essential (primary) hypertension: Secondary | ICD-10-CM | POA: Diagnosis not present

## 2022-03-28 MED ORDER — TERBINAFINE HCL 250 MG PO TABS
ORAL_TABLET | ORAL | 3 refills | Status: DC
  Filled 2022-03-28: qty 30, 30d supply, fill #0
  Filled 2022-04-21: qty 30, 30d supply, fill #1
  Filled 2022-05-29: qty 30, 30d supply, fill #2
  Filled 2022-06-24: qty 30, 30d supply, fill #3

## 2022-03-31 ENCOUNTER — Other Ambulatory Visit (HOSPITAL_COMMUNITY): Payer: Self-pay

## 2022-04-16 ENCOUNTER — Telehealth: Payer: Self-pay | Admitting: Urology

## 2022-04-16 NOTE — Telephone Encounter (Signed)
DOS - 05/07/22  TARSAL EXOSTECTOMY LEFT --- 75436  [facility] EFFECTIVE DATE - 09/17/21  PLAN DEDUCTIBLE - $350.00 W/ $37.47 REMAINING OUT OF POCKET - $7900.00 W/ $0,677.03 REMAINING COINSURANCE - 40% COPAY  - $0.00   SPOKE WITH BRENDA WITH [facility] AND SHE STATED THAT FOR CPT CODE 40352 NO PRIOR AUTH IS REQUIRED.   REF # [id number]

## 2022-04-17 DIAGNOSIS — Z5189 Encounter for other specified aftercare: Secondary | ICD-10-CM | POA: Diagnosis not present

## 2022-04-21 ENCOUNTER — Other Ambulatory Visit (HOSPITAL_COMMUNITY): Payer: Self-pay

## 2022-05-07 ENCOUNTER — Other Ambulatory Visit: Payer: Self-pay | Admitting: Podiatry

## 2022-05-07 ENCOUNTER — Encounter: Payer: Self-pay | Admitting: Podiatry

## 2022-05-07 ENCOUNTER — Other Ambulatory Visit (HOSPITAL_COMMUNITY): Payer: Self-pay

## 2022-05-07 DIAGNOSIS — M25775 Osteophyte, left foot: Secondary | ICD-10-CM | POA: Diagnosis not present

## 2022-05-07 MED ORDER — OXYCODONE-ACETAMINOPHEN 5-325 MG PO TABS
1.0000 | ORAL_TABLET | ORAL | 0 refills | Status: DC | PRN
  Filled 2022-05-07: qty 30, 5d supply, fill #0

## 2022-05-07 MED ORDER — IBUPROFEN 800 MG PO TABS
800.0000 mg | ORAL_TABLET | Freq: Four times a day (QID) | ORAL | 1 refills | Status: DC | PRN
  Filled 2022-05-07: qty 60, 15d supply, fill #0

## 2022-05-10 ENCOUNTER — Other Ambulatory Visit (HOSPITAL_COMMUNITY): Payer: Self-pay

## 2022-05-16 ENCOUNTER — Ambulatory Visit (INDEPENDENT_AMBULATORY_CARE_PROVIDER_SITE_OTHER): Payer: 59 | Admitting: Podiatry

## 2022-05-16 ENCOUNTER — Ambulatory Visit (INDEPENDENT_AMBULATORY_CARE_PROVIDER_SITE_OTHER): Payer: 59

## 2022-05-16 ENCOUNTER — Encounter: Payer: Self-pay | Admitting: Podiatry

## 2022-05-16 DIAGNOSIS — M898X9 Other specified disorders of bone, unspecified site: Secondary | ICD-10-CM

## 2022-05-16 NOTE — Progress Notes (Signed)
Subjective:  Patient ID: Johnny Robinson, male    DOB: Dec 22, 1965,  MRN: [id number]  Chief Complaint  Patient presents with   Routine Post Op    POV #1 DOS 05/07/2022 LT DORSAL FOOT EXOSTECTOMY    DOS: 05/07/2022 Procedure: Left dorsal foot exostectomy  56 y.o. male returns for post-op check.  Patient states is doing well no acute complaints.  Pain medications help controlling the pain.  Weightbearing as tolerated in surgical shoe bandages clean dry and intact  Review of Systems: Negative except as noted in the HPI. Denies N/V/F/Ch.  Past Medical History:  Diagnosis Date   Arthritis    Chronic kidney disease 2016   RENAL INFARCT   GERD (gastroesophageal reflux disease)    Hypertension    Reflux    Subdural hematoma (HCC) 1991    Current Outpatient Medications:    acetaminophen (TYLENOL) 500 MG tablet, Take 500-1,000 mg by mouth every 6 (six) hours as needed (pain)., Disp: , Rfl:    atorvastatin (LIPITOR) 20 MG tablet, Take 1 tablet (20 mg total) by mouth once daily (Patient not taking: Reported on 02/19/2022), Disp: 90 tablet, Rfl: 3   atorvastatin (LIPITOR) 20 MG tablet, Take 1 tablet (20 mg total) by mouth once daily, Disp: 90 tablet, Rfl: 3   HYDROcodone-acetaminophen (NORCO/VICODIN) 5-325 MG tablet, Take 1 to 2 tablets by mouth every 6 (six) hours as needed for severe pain., Disp: 42 tablet, Rfl: 0   ibuprofen (ADVIL) 800 MG tablet, Take 1 tablet (800 mg total) by mouth every 6 (six) hours as needed., Disp: 60 tablet, Rfl: 1   losartan (COZAAR) 100 MG tablet, TAKE 1 TABLET BY MOUTH ONCE DAILY (Patient not taking: Reported on 02/19/2022), Disp: 90 tablet, Rfl: 3   losartan (COZAAR) 100 MG tablet, Take 1 tablet (100 mg total) by mouth once daily, Disp: 90 tablet, Rfl: 3   methocarbamol (ROBAXIN) 500 MG tablet, Take 1 tablet (500 mg total) by mouth every 6 (six) hours as needed for muscle spasms., Disp: 40 tablet, Rfl: 0   methocarbamol (ROBAXIN) 500 MG tablet, Take 1 tablet by mouth every  6 hours as needed for muscle spasms, Disp: 40 tablet, Rfl: 0   metoprolol succinate (TOPROL-XL) 25 MG 24 hr tablet, Take 1 tablet (25 mg total) by mouth once daily, Disp: 90 tablet, Rfl: 3   metoprolol succinate (TOPROL-XL) 25 MG 24 hr tablet, Take 1 tablet (25 mg total) by mouth once daily (Patient not taking: Reported on 02/19/2022), Disp: 90 tablet, Rfl: 3   Multiple Vitamin (MULTIVITAMIN WITH MINERALS) TABS tablet, Take 1 tablet by mouth in the morning., Disp: , Rfl:    omeprazole (PRILOSEC) 40 MG capsule, TAKE ONE CAPSULE BY MOUTH EVERY MORNING, Disp: 90 capsule, Rfl: 3   omeprazole (PRILOSEC) 40 MG capsule, Take 1 capsule (40 mg total) by mouth once daily, Disp: 90 capsule, Rfl: 3   oxyCODONE-acetaminophen (PERCOCET) 5-325 MG tablet, Take 1 tablet by mouth every 4 (four) hours as needed for severe pain., Disp: 30 tablet, Rfl: 0   terbinafine (LAMISIL) 250 MG tablet, Take 1 tablet by mouth once a day, Disp: 30 tablet, Rfl: 3   traMADol (ULTRAM) 50 MG tablet, Take 1-2 tablets (50-100 mg total) by mouth every 6 (six) hours as needed for moderate pain., Disp: 40 tablet, Rfl: 0   traMADol (ULTRAM) 50 MG tablet, Take 1 to 2 tablets by mouth every 6 hours as needed for moderate pain., Disp: 40 tablet, Rfl: 0  Social History  Tobacco Use  Smoking Status Never  Smokeless Tobacco Never    No Known Allergies Objective:  There were no vitals filed for this visit. There is no height or weight on file to calculate BMI. Constitutional Well developed. Well nourished.  Vascular Foot warm and well perfused. Capillary refill normal to all digits.   Neurologic Normal speech. Oriented to person, place, and time. Epicritic sensation to light touch grossly present bilaterally.  Dermatologic Skin healing well without signs of infection. Skin edges well coapted without signs of infection.  Orthopedic: Tenderness to palpation noted about the surgical site.   Radiographs: 3 views of skeletally mature the  left foot: Exostectomy noted.  Plantar and posterior heel spurring noted no other bony abnormalities identified Assessment:   1. Exostosis    Plan:  Patient was evaluated and treated and all questions answered.  S/p foot surgery left -Progressing as expected post-operatively. -XR: See above -WB Status: Weightbearing as tolerated in surgical shoe -Sutures: Intact.  No signs of Deis is noted.  No complication noted. -Medications: None -Foot redressed.  No follow-ups on file.

## 2022-05-29 ENCOUNTER — Other Ambulatory Visit (HOSPITAL_COMMUNITY): Payer: Self-pay

## 2022-05-30 ENCOUNTER — Encounter: Payer: 59 | Admitting: Podiatry

## 2022-05-30 ENCOUNTER — Other Ambulatory Visit (HOSPITAL_COMMUNITY): Payer: Self-pay

## 2022-06-13 DIAGNOSIS — K219 Gastro-esophageal reflux disease without esophagitis: Secondary | ICD-10-CM | POA: Diagnosis not present

## 2022-06-13 DIAGNOSIS — Z79899 Other long term (current) drug therapy: Secondary | ICD-10-CM | POA: Diagnosis not present

## 2022-06-13 DIAGNOSIS — R079 Chest pain, unspecified: Secondary | ICD-10-CM | POA: Diagnosis not present

## 2022-06-13 DIAGNOSIS — R7309 Other abnormal glucose: Secondary | ICD-10-CM | POA: Diagnosis not present

## 2022-06-13 DIAGNOSIS — I10 Essential (primary) hypertension: Secondary | ICD-10-CM | POA: Diagnosis not present

## 2022-06-13 DIAGNOSIS — E782 Mixed hyperlipidemia: Secondary | ICD-10-CM | POA: Diagnosis not present

## 2022-06-13 DIAGNOSIS — M5442 Lumbago with sciatica, left side: Secondary | ICD-10-CM | POA: Diagnosis not present

## 2022-06-13 DIAGNOSIS — Z0000 Encounter for general adult medical examination without abnormal findings: Secondary | ICD-10-CM | POA: Diagnosis not present

## 2022-06-13 DIAGNOSIS — K649 Unspecified hemorrhoids: Secondary | ICD-10-CM | POA: Diagnosis not present

## 2022-06-19 DIAGNOSIS — K640 First degree hemorrhoids: Secondary | ICD-10-CM | POA: Diagnosis not present

## 2022-06-25 ENCOUNTER — Other Ambulatory Visit (HOSPITAL_COMMUNITY): Payer: Self-pay

## 2022-06-27 DIAGNOSIS — K640 First degree hemorrhoids: Secondary | ICD-10-CM | POA: Diagnosis not present

## 2022-07-12 DIAGNOSIS — R079 Chest pain, unspecified: Secondary | ICD-10-CM | POA: Diagnosis not present

## 2022-07-16 ENCOUNTER — Other Ambulatory Visit (HOSPITAL_COMMUNITY): Payer: Self-pay

## 2022-07-16 ENCOUNTER — Encounter (INDEPENDENT_AMBULATORY_CARE_PROVIDER_SITE_OTHER): Payer: Self-pay

## 2022-07-16 MED ORDER — PREDNISONE 20 MG PO TABS
20.0000 mg | ORAL_TABLET | Freq: Every day | ORAL | 0 refills | Status: DC
  Filled 2022-07-16: qty 7, 7d supply, fill #0

## 2022-07-16 MED ORDER — AZITHROMYCIN 250 MG PO TABS
ORAL_TABLET | ORAL | 0 refills | Status: AC
  Filled 2022-07-16: qty 6, 5d supply, fill #0

## 2022-07-18 ENCOUNTER — Other Ambulatory Visit (HOSPITAL_COMMUNITY): Payer: Self-pay

## 2022-07-21 ENCOUNTER — Other Ambulatory Visit (HOSPITAL_COMMUNITY): Payer: Self-pay

## 2022-07-30 ENCOUNTER — Other Ambulatory Visit (HOSPITAL_COMMUNITY): Payer: Self-pay

## 2022-07-31 ENCOUNTER — Other Ambulatory Visit (HOSPITAL_COMMUNITY): Payer: Self-pay

## 2022-07-31 MED ORDER — TERBINAFINE HCL 250 MG PO TABS
250.0000 mg | ORAL_TABLET | Freq: Every day | ORAL | 3 refills | Status: DC
  Filled 2022-07-31: qty 30, 30d supply, fill #0
  Filled 2022-08-23: qty 30, 30d supply, fill #1
  Filled 2022-09-18 – 2022-09-24 (×2): qty 30, 30d supply, fill #2
  Filled 2022-10-30: qty 30, 30d supply, fill #3

## 2022-08-25 ENCOUNTER — Other Ambulatory Visit (HOSPITAL_COMMUNITY): Payer: Self-pay

## 2022-08-27 ENCOUNTER — Other Ambulatory Visit (HOSPITAL_COMMUNITY): Payer: Self-pay

## 2022-09-18 ENCOUNTER — Other Ambulatory Visit: Payer: Self-pay

## 2022-10-19 ENCOUNTER — Other Ambulatory Visit (HOSPITAL_COMMUNITY): Payer: Self-pay

## 2022-10-19 DIAGNOSIS — Z6826 Body mass index (BMI) 26.0-26.9, adult: Secondary | ICD-10-CM | POA: Diagnosis not present

## 2022-10-19 DIAGNOSIS — M5412 Radiculopathy, cervical region: Secondary | ICD-10-CM | POA: Diagnosis not present

## 2022-10-19 MED ORDER — METHYLPREDNISOLONE 4 MG PO TBPK
ORAL_TABLET | ORAL | 0 refills | Status: DC
  Filled 2022-10-19: qty 21, 6d supply, fill #0

## 2022-10-24 DIAGNOSIS — M4802 Spinal stenosis, cervical region: Secondary | ICD-10-CM | POA: Diagnosis not present

## 2022-10-24 DIAGNOSIS — M5412 Radiculopathy, cervical region: Secondary | ICD-10-CM | POA: Diagnosis not present

## 2022-11-01 DIAGNOSIS — Z6826 Body mass index (BMI) 26.0-26.9, adult: Secondary | ICD-10-CM | POA: Diagnosis not present

## 2022-11-01 DIAGNOSIS — M5412 Radiculopathy, cervical region: Secondary | ICD-10-CM | POA: Diagnosis not present

## 2022-11-12 ENCOUNTER — Other Ambulatory Visit (HOSPITAL_COMMUNITY): Payer: Self-pay

## 2022-11-12 DIAGNOSIS — M5412 Radiculopathy, cervical region: Secondary | ICD-10-CM | POA: Diagnosis not present

## 2022-11-12 DIAGNOSIS — M5011 Cervical disc disorder with radiculopathy,  high cervical region: Secondary | ICD-10-CM | POA: Diagnosis not present

## 2022-11-12 DIAGNOSIS — M4802 Spinal stenosis, cervical region: Secondary | ICD-10-CM | POA: Diagnosis not present

## 2022-11-12 DIAGNOSIS — M50122 Cervical disc disorder at C5-C6 level with radiculopathy: Secondary | ICD-10-CM | POA: Diagnosis not present

## 2022-11-12 DIAGNOSIS — M4712 Other spondylosis with myelopathy, cervical region: Secondary | ICD-10-CM | POA: Diagnosis not present

## 2022-11-12 DIAGNOSIS — M4722 Other spondylosis with radiculopathy, cervical region: Secondary | ICD-10-CM | POA: Diagnosis not present

## 2022-11-12 MED ORDER — CYCLOBENZAPRINE HCL 10 MG PO TABS
10.0000 mg | ORAL_TABLET | Freq: Three times a day (TID) | ORAL | 0 refills | Status: DC | PRN
  Filled 2022-11-12: qty 30, 10d supply, fill #0

## 2022-11-12 MED ORDER — HYDROCODONE-ACETAMINOPHEN 5-325 MG PO TABS
1.0000 | ORAL_TABLET | ORAL | 0 refills | Status: DC | PRN
  Filled 2022-11-12: qty 30, 5d supply, fill #0

## 2022-11-13 ENCOUNTER — Other Ambulatory Visit (HOSPITAL_COMMUNITY): Payer: Self-pay

## 2022-11-13 ENCOUNTER — Inpatient Hospital Stay (HOSPITAL_COMMUNITY)
Admission: AD | Admit: 2022-11-13 | Discharge: 2022-11-15 | DRG: 092 | Disposition: A | Discharge status: Rehabilitation Facility | Payer: Commercial Managed Care - PPO | Attending: Neurosurgery | Admitting: Neurosurgery

## 2022-11-13 ENCOUNTER — Encounter (HOSPITAL_COMMUNITY): Payer: Self-pay

## 2022-11-13 ENCOUNTER — Other Ambulatory Visit: Payer: Self-pay

## 2022-11-13 ENCOUNTER — Inpatient Hospital Stay (HOSPITAL_COMMUNITY): Payer: Commercial Managed Care - PPO

## 2022-11-13 ENCOUNTER — Encounter (HOSPITAL_COMMUNITY): Payer: Self-pay | Admitting: Neurosurgery

## 2022-11-13 DIAGNOSIS — Z7982 Long term (current) use of aspirin: Secondary | ICD-10-CM | POA: Diagnosis not present

## 2022-11-13 DIAGNOSIS — R5381 Other malaise: Secondary | ICD-10-CM | POA: Diagnosis present

## 2022-11-13 DIAGNOSIS — M5000 Cervical disc disorder with myelopathy, unspecified cervical region: Secondary | ICD-10-CM | POA: Diagnosis not present

## 2022-11-13 DIAGNOSIS — Z96642 Presence of left artificial hip joint: Secondary | ICD-10-CM | POA: Diagnosis not present

## 2022-11-13 DIAGNOSIS — M545 Low back pain, unspecified: Secondary | ICD-10-CM | POA: Diagnosis present

## 2022-11-13 DIAGNOSIS — K219 Gastro-esophageal reflux disease without esophagitis: Secondary | ICD-10-CM | POA: Diagnosis not present

## 2022-11-13 DIAGNOSIS — J189 Pneumonia, unspecified organism: Secondary | ICD-10-CM | POA: Diagnosis not present

## 2022-11-13 DIAGNOSIS — G9519 Other vascular myelopathies: Principal | ICD-10-CM | POA: Diagnosis present

## 2022-11-13 DIAGNOSIS — N280 Ischemia and infarction of kidney: Secondary | ICD-10-CM | POA: Diagnosis present

## 2022-11-13 DIAGNOSIS — I10 Essential (primary) hypertension: Secondary | ICD-10-CM | POA: Diagnosis present

## 2022-11-13 DIAGNOSIS — G8929 Other chronic pain: Secondary | ICD-10-CM | POA: Diagnosis not present

## 2022-11-13 DIAGNOSIS — R739 Hyperglycemia, unspecified: Secondary | ICD-10-CM | POA: Diagnosis not present

## 2022-11-13 DIAGNOSIS — Z79899 Other long term (current) drug therapy: Secondary | ICD-10-CM | POA: Diagnosis not present

## 2022-11-13 DIAGNOSIS — G959 Disease of spinal cord, unspecified: Secondary | ICD-10-CM | POA: Diagnosis not present

## 2022-11-13 DIAGNOSIS — M4802 Spinal stenosis, cervical region: Secondary | ICD-10-CM | POA: Diagnosis not present

## 2022-11-13 DIAGNOSIS — M5412 Radiculopathy, cervical region: Secondary | ICD-10-CM | POA: Diagnosis not present

## 2022-11-13 DIAGNOSIS — E871 Hypo-osmolality and hyponatremia: Secondary | ICD-10-CM | POA: Diagnosis not present

## 2022-11-13 DIAGNOSIS — M791 Myalgia, unspecified site: Secondary | ICD-10-CM | POA: Diagnosis not present

## 2022-11-13 DIAGNOSIS — G8194 Hemiplegia, unspecified affecting left nondominant side: Secondary | ICD-10-CM | POA: Diagnosis not present

## 2022-11-13 DIAGNOSIS — G894 Chronic pain syndrome: Secondary | ICD-10-CM | POA: Diagnosis not present

## 2022-11-13 DIAGNOSIS — R531 Weakness: Secondary | ICD-10-CM | POA: Diagnosis present

## 2022-11-13 DIAGNOSIS — M7918 Myalgia, other site: Secondary | ICD-10-CM | POA: Diagnosis not present

## 2022-11-13 LAB — GLUCOSE, CAPILLARY: Glucose-Capillary: 150 mg/dL — ABNORMAL HIGH (ref 70–99)

## 2022-11-13 MED ORDER — ACETAMINOPHEN 325 MG PO TABS: 650.0000 mg | ORAL_TABLET | ORAL | Status: DC | PRN

## 2022-11-13 MED ORDER — SODIUM CHLORIDE 0.9% FLUSH: 3.0000 mL | INTRAVENOUS | Status: DC | PRN

## 2022-11-13 MED ORDER — ACETAMINOPHEN 650 MG RE SUPP: 650.0000 mg | RECTAL | Status: DC | PRN

## 2022-11-13 MED ORDER — CYCLOBENZAPRINE HCL 10 MG PO TABS
10.0000 mg | ORAL_TABLET | Freq: Three times a day (TID) | ORAL | Status: DC | PRN
  Administered 2022-11-13 – 2022-11-14 (×2): 10 mg via ORAL
  Filled 2022-11-13 (×2): qty 1

## 2022-11-13 MED ORDER — CEFAZOLIN SODIUM-DEXTROSE 1-4 GM/50ML-% IV SOLN
1.0000 g | Freq: Three times a day (TID) | INTRAVENOUS | Status: AC
  Administered 2022-11-13 – 2022-11-14 (×2): 1 g via INTRAVENOUS
  Filled 2022-11-13 (×2): qty 50

## 2022-11-13 MED ORDER — ONDANSETRON HCL 4 MG PO TABS: 4.0000 mg | ORAL_TABLET | Freq: Four times a day (QID) | ORAL | Status: DC | PRN

## 2022-11-13 MED ORDER — SODIUM CHLORIDE 0.9 % IV SOLN
250.0000 mL | INTRAVENOUS | Status: DC
  Administered 2022-11-13: 250 mL via INTRAVENOUS

## 2022-11-13 MED ORDER — HYDROMORPHONE HCL 1 MG/ML IJ SOLN: 1.0000 mg | INTRAMUSCULAR | Status: DC | PRN

## 2022-11-13 MED ORDER — SODIUM CHLORIDE 0.9% FLUSH
3.0000 mL | Freq: Two times a day (BID) | INTRAVENOUS | Status: DC
  Administered 2022-11-13 – 2022-11-15 (×5): 3 mL via INTRAVENOUS

## 2022-11-13 MED ORDER — ONDANSETRON HCL 4 MG/2ML IJ SOLN: 4.0000 mg | Freq: Four times a day (QID) | INTRAMUSCULAR | Status: DC | PRN

## 2022-11-13 MED ORDER — MENTHOL 3 MG MT LOZG: 1.0000 | LOZENGE | OROMUCOSAL | Status: DC | PRN

## 2022-11-13 MED ORDER — HYDROCODONE-ACETAMINOPHEN 5-325 MG PO TABS
1.0000 | ORAL_TABLET | ORAL | Status: DC | PRN
  Administered 2022-11-13 – 2022-11-14 (×2): 1 via ORAL
  Filled 2022-11-13 (×2): qty 1

## 2022-11-13 MED ORDER — PHENOL 1.4 % MT LIQD: 1.0000 | OROMUCOSAL | Status: DC | PRN

## 2022-11-13 MED ORDER — HYDROCODONE-ACETAMINOPHEN 10-325 MG PO TABS
2.0000 | ORAL_TABLET | ORAL | Status: DC | PRN
  Administered 2022-11-13 – 2022-11-15 (×6): 2 via ORAL
  Filled 2022-11-13 (×6): qty 2

## 2022-11-13 NOTE — H&P (Signed)
Johnny Robinson is an 57 y.o. male.   Chief Complaint: Weakness HPI: 57 year old male status post 3 level anterior cervical discectomy and fusion for treatment of compressive cervical myelopathy with radiculopathy.  Surgery uncomplicated.  Postoperatively the patient awakened from anesthesia with profound left-sided weakness involving his left distal upper extremity and entire left lower extremity.  As the patient emerged from anesthesia he began to have some improved function in his left lower extremity which sequentially improved through the day and overnight.  The patient had no pain.  He had no dysesthesia.  He did note some numbness in his left forearm and hand and some mildly decreased sensation in his left lower extremity.  He had no right upper extremity symptoms nor did he have any right lower extremity symptoms and has maintained normal function in his right upper and lower extremity.  The patient was further observed overnight where he had progressive return of function in his left lower extremity but still had profound weakness in his left grip and intrinsic function and still had proximal left lower extremity weakness preventing mobilization.  The patient has been transferred to [facility] for further evaluation and therapy.  He continues to have minimal if any pain.  He has no dysesthesia.  He has been able to void.  Past Medical History:  Diagnosis Date   Arthritis    Chronic kidney disease 2016   RENAL INFARCT   GERD (gastroesophageal reflux disease)    Hypertension    Reflux    Subdural hematoma ([place]) 1991    Past Surgical History:  Procedure Laterality Date   BACK SURGERY  11/2017   COLONOSCOPY WITH PROPOFOL N/A 09/02/2018   Procedure: COLONOSCOPY WITH PROPOFOL;  Surgeon: Lucilla Lame, MD;  Location: [facility] ENDOSCOPY;  Service: Endoscopy;  Laterality: N/A;   EXCISION MASS ABDOMINAL Left 05/28/2018   Procedure: EXCISIONAL BIOPSY MASS ABDOMINAL WALL;  Surgeon: Herbert Pun,  MD;  Location: [facility] ORS;  Service: General;  Laterality: Left;   PERIPHERAL VASCULAR CATHETERIZATION Right 05/18/2015   Procedure: Renal Angiography;  Surgeon: Algernon Huxley, MD;  Location: [place] [facility];  Service: Cardiovascular;  Laterality: Right;   PERIPHERAL VASCULAR CATHETERIZATION Bilateral 05/18/2015   Procedure: Renal Intervention;  Surgeon: Algernon Huxley, MD;  Location: [place];  Service: Cardiovascular;  Laterality: Bilateral;   TOTAL HIP ARTHROPLASTY Left 03/07/2022   Procedure: TOTAL HIP ARTHROPLASTY ANTERIOR APPROACH;  Surgeon: Gaynelle Arabian, MD;  Location: [facility] OR[facility];  Service: Orthopedics;  Laterality: Left;    Family History  Problem Relation Age of Onset   Nephrolithiasis Mother    Cancer Father    Social History:  reports that he has never smoked. He has never used smokeless tobacco. He reports current alcohol use. He reports that he does not use drugs.  Allergies: No Known Allergies  Medications Prior to Admission  Medication Sig Dispense Refill   acetaminophen (TYLENOL) 500 MG tablet Take 500-1,000 mg by mouth every 6 (six) hours as needed (pain).     atorvastatin (LIPITOR) 20 MG tablet Take 1 tablet (20 mg total) by mouth once daily (Patient not taking: Reported on 02/19/2022) 90 tablet 3   atorvastatin (LIPITOR) 20 MG tablet Take 1 tablet (20 mg total) by mouth once daily 90 tablet 3   cyclobenzaprine (FLEXERIL) 10 MG tablet Take 1 tablet (10 mg total) by mouth 3 (three) times daily as needed for muscle spasms 30 tablet 0   HYDROcodone-acetaminophen (NORCO/VICODIN) 5-325 MG tablet Take 1 to  2 tablets by mouth every 6 (six) hours as needed for severe pain. 42 tablet 0   HYDROcodone-acetaminophen (NORCO/VICODIN) 5-325 MG tablet Take 1 tablet by mouth every 4 (four) hours as needed for pain 30 tablet 0   ibuprofen (ADVIL) 800 MG tablet Take 1 tablet (800 mg total) by mouth every 6 (six) hours as needed. 60 tablet 1   losartan (COZAAR) 100 MG tablet TAKE 1 TABLET  BY MOUTH ONCE DAILY (Patient not taking: Reported on 02/19/2022) 90 tablet 3   losartan (COZAAR) 100 MG tablet Take 1 tablet (100 mg total) by mouth once daily 90 tablet 3   methocarbamol (ROBAXIN) 500 MG tablet Take 1 tablet (500 mg total) by mouth every 6 (six) hours as needed for muscle spasms. 40 tablet 0   methocarbamol (ROBAXIN) 500 MG tablet Take 1 tablet by mouth every 6 hours as needed for muscle spasms 40 tablet 0   methylPREDNISolone (MEDROL DOSEPAK) 4 MG TBPK tablet Take as directed per package directions 21 tablet 0   metoprolol succinate (TOPROL-XL) 25 MG 24 hr tablet Take 1 tablet (25 mg total) by mouth once daily 90 tablet 3   metoprolol succinate (TOPROL-XL) 25 MG 24 hr tablet Take 1 tablet (25 mg total) by mouth once daily (Patient not taking: Reported on 02/19/2022) 90 tablet 3   Multiple Vitamin (MULTIVITAMIN WITH MINERALS) TABS tablet Take 1 tablet by mouth in the morning.     omeprazole (PRILOSEC) 40 MG capsule TAKE ONE CAPSULE BY MOUTH EVERY MORNING 90 capsule 3   omeprazole (PRILOSEC) 40 MG capsule Take 1 capsule (40 mg total) by mouth once daily 90 capsule 3   oxyCODONE-acetaminophen (PERCOCET) 5-325 MG tablet Take 1 tablet by mouth every 4 (four) hours as needed for severe pain. 30 tablet 0   predniSONE (DELTASONE) 20 MG tablet Take 1 tablet (20 mg total) by mouth daily. 7 tablet 0   terbinafine (LAMISIL) 250 MG tablet Take 1 tablet by mouth once a day 30 tablet 3   terbinafine (LAMISIL) 250 MG tablet Take 1 tablet (250 mg total) by mouth daily. 30 tablet 3   traMADol (ULTRAM) 50 MG tablet Take 1-2 tablets (50-100 mg total) by mouth every 6 (six) hours as needed for moderate pain. 40 tablet 0   traMADol (ULTRAM) 50 MG tablet Take 1 to 2 tablets by mouth every 6 hours as needed for moderate pain. 40 tablet 0    No results found for this or any previous visit (from the past 48 hour(s)). No results found.  Pertinent items noted in HPI and remainder of comprehensive ROS  otherwise negative.  There were no vitals taken for this visit.  The patient is awake and alert.  He is oriented and appropriate.  His speech and language are normal and fluent.  His cranial nerve function is normal bilaterally.  Motor examination with intact deltoid strength bilaterally.  He has intact bicep strength bilaterally.  He has normal right brachioradialis, wrist extension triceps grips and intrinsic function on the right.  On the left his brachial radialis and wrist extension and triceps function are 4+/5.  He has placed grip strength in 0/5 left intrinsic function.  Lower extremity motor examination reveals his right lower extremity to be 5/5 in all muscle groups.  Left hip flexors are 2/5.  Left quadriceps muscle group is 3 to 4-/5.  Patient with 3 to 4-/5 dorsiflexion on the left.  He has 4/5 plantarflexion.  Sensory examination with decrease sensation in his  left C8 dermatome and some mildly decreased sensation in his left lower extremity and left trunk.  Right lower extremity sensation essentially normal although there may be some diminished pain sensation on the right.  His reflexes are normal in his left upper extremity and right upper extremity.  He has normal right lower extremity reflexes with diminished to absent left lower extremity reflexes.  There is no clonus.  His wound is healing well.  He is breathing easily.  Examination of his head ears eyes and throat are normal.  Chest and abdomen are benign.  Extremities are free from injury or deformity. 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  Plan MRI scan emergently today.  Begin therapies.  I doubt that there is a compressive hematoma.  I would expect the patient to improve with therapy and time.  I discussed situation with the patient and his wife.  They understand.  Cooper Render Wanda Rideout 11/13/2022, 1:37 PM

## 2022-11-13 NOTE — Evaluation (Signed)
Physical Therapy Evaluation Patient Details Name: Johnny Robinson MRN: [id number] DOB: 09/10/1966 Today's Date: 11/13/2022  History of Present Illness  Pt is 57 yo male who presents with L sided weakness after C3-6 ACDF for cervical myelopathy. PMH: L THA 6/23, OA, GERD, CKD, HTN, lumbar surgery x3, SDH in 1990's.  Clinical Impression  Pt admitted with above diagnosis. Pt from home with family, 2 level home with 15 steps to bedroom. Pt works full time as a Chief Executive Officer. Wife works from home for [employer]. On eval pt with profound weakness LLE that is improving distally but 0/5 hip flexor. He has increased strength proximal LUE and profound weakness L hand. Pt able to stand at bedside with mod A +2, L knee buckling with initiation of gait. Recommend AIR for return to independence.  Pt currently with functional limitations due to the deficits listed below (see PT Problem List). Pt will benefit from skilled PT to increase their independence and safety with mobility to allow discharge to the venue listed below.          Recommendations for follow up therapy are one component of a multi-disciplinary discharge planning process, led by the attending physician.  Recommendations may be updated based on patient status, additional functional criteria and insurance authorization.  Follow Up Recommendations Acute inpatient rehab (3hours/day)      Assistance Recommended at Discharge Frequent or constant Supervision/Assistance  Patient can return home with the following  A lot of help with walking and/or transfers;A lot of help with bathing/dressing/bathroom;Assistance with cooking/housework;Assist for transportation;Help with stairs or ramp for entrance    Equipment Recommendations Other (comment) (TBD)  Recommendations for Other Services  Rehab consult;OT consult    Functional Status Assessment Patient has had a recent decline in their functional status and demonstrates the ability to make significant improvements in  function in a reasonable and predictable amount of time.     Precautions / Restrictions Precautions Precautions: Fall;Cervical Precaution Booklet Issued: Yes (comment) Precaution Comments: reviewed cervical precautions Required Braces or Orthoses: Cervical Brace Cervical Brace: Soft collar Restrictions Weight Bearing Restrictions: No      Mobility  Bed Mobility Overal bed mobility: Needs Assistance Bed Mobility: Rolling, Sidelying to Sit, Sit to Supine Rolling: Min guard Sidelying to sit: Min assist   Sit to supine: Min assist   General bed mobility comments: pt able to roll to L with min guard A, assist needed to slide LLE off EOB and stabilize LE's while pt elevated trunk    Transfers Overall transfer level: Needs assistance Equipment used: 2 person hand held assist Transfers: Sit to/from Stand Sit to Stand: Mod assist, +2 physical assistance           General transfer comment: mod A +2, L knee blocked    Ambulation/Gait Ambulation/Gait assistance: Mod assist, +2 physical assistance Gait Distance (Feet): 3 Feet Assistive device: 2 person hand held assist Gait Pattern/deviations: Step-to pattern Gait velocity: decreased Gait velocity interpretation: <1.31 ft/sec, indicative of household ambulator   General Gait Details: sidesteps to [facility] with L knee blocked, mod A for support  Stairs            Wheelchair Mobility    Modified Rankin (Stroke Patients Only)       Balance Overall balance assessment: Needs assistance Sitting-balance support: No upper extremity supported, Feet supported Sitting balance-Leahy Scale: Fair Sitting balance - Comments: can maintain static balance but loses balance when wt shifting Postural control: Right lateral lean Standing balance support: Bilateral upper extremity  supported, During functional activity Standing balance-Leahy Scale: Poor Standing balance comment: mod A needed to safely stand                              Pertinent Vitals/Pain Pain Assessment Pain Assessment: Faces Faces Pain Scale: Hurts a little bit Pain Location: LUE, LLE, between shoulder blades, L hand Pain Descriptors / Indicators: Heaviness, Discomfort Pain Intervention(s): Limited activity within patient's tolerance, Monitored during session    Home Living Family/patient expects to be discharged to:: Private residence Living Arrangements: Spouse/significant other Available Help at Discharge: Family;Available PRN/intermittently Type of Home: House Home Access: Stairs to enter Entrance Stairs-Rails: None Entrance Stairs-Number of Steps: 4 Alternate Level Stairs-Number of Steps: 15 Home Layout: Multi-level;Bed/bath upstairs Home Equipment: Conservation officer, nature (2 wheels) 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 [employer]    Prior Function Prior Level of Function : Independent/Modified Independent             Mobility Comments: independent ADLs Comments: independent     Hand Dominance   Dominant Hand: Right    Extremity/Trunk Assessment   Upper Extremity Assessment Upper Extremity Assessment: Defer to OT evaluation (stronger proximally, very weak grip, no active extension)    Lower Extremity Assessment Lower Extremity Assessment: LLE deficits/detail LLE Deficits / Details: hip flex 0/5, knee ext 2/5, knee flex 3/5ankle df 3/5, pf 4/5 LLE Sensation: decreased light touch;decreased proprioception LLE Coordination: decreased gross motor;decreased fine motor    Cervical / Trunk Assessment Cervical / Trunk Assessment: Neck Surgery  Communication   Communication: No difficulties  Cognition Arousal/Alertness: Awake/alert Behavior During Therapy: WFL for tasks assessed/performed Overall Cognitive Status: Within Functional Limits for tasks assessed                                          General Comments General comments (skin integrity, edema, etc.): VSS    Exercises      Assessment/Plan    PT Assessment Patient needs continued PT services  PT Problem List Decreased strength;Decreased range of motion;Decreased activity tolerance;Decreased balance;Decreased mobility;Decreased coordination;Pain;Impaired sensation;Impaired tone;Decreased knowledge of use of DME;Decreased knowledge of precautions       PT Treatment Interventions DME instruction;Stair training;Gait training;Functional mobility training;Therapeutic activities;Therapeutic exercise;Balance training;Neuromuscular re-education;Cognitive remediation;Patient/family education;Wheelchair mobility training    PT Goals (Current goals can be found in the Care Plan section)  Acute Rehab PT Goals Patient Stated Goal: return to home and work PT Goal Formulation: With patient Time For Goal Achievement: 11/27/22 Potential to Achieve Goals: Good    Frequency Min 5X/week     Co-evaluation               AM-PAC PT "6 Clicks" Mobility  Outcome Measure Help needed turning from your back to your side while in a flat bed without using bedrails?: A Little Help needed moving from lying on your back to sitting on the side of a flat bed without using bedrails?: A Lot Help needed moving to and from a bed to a chair (including a wheelchair)?: A Lot Help needed standing up from a chair using your arms (e.g., wheelchair or bedside chair)?: A Lot Help needed to walk in hospital room?: Total Help needed climbing 3-5 steps with a railing? : Total 6 Click Score: 11    End of Session   Activity Tolerance: Patient  tolerated treatment well Patient left: in bed;with call bell/phone within reach;with bed alarm set Nurse Communication: Mobility status PT Visit Diagnosis: Unsteadiness on feet (R26.81);Difficulty in walking, not elsewhere classified (R26.2);Pain;Other abnormalities of gait and mobility (R26.89) Pain - Right/Left: Left Pain - part of body: Arm;Leg    Time: [id number] PT Time Calculation (min)  (ACUTE ONLY): 31 min   Charges:   PT Evaluation $PT Eval Moderate Complexity: 1 Mod PT Treatments $Therapeutic Activity: 8-22 mins        Leighton Roach, PT  Acute Rehab Services Secure chat preferred Office [place] 11/13/2022, 5:18 PM

## 2022-11-14 DIAGNOSIS — G959 Disease of spinal cord, unspecified: Secondary | ICD-10-CM

## 2022-11-14 MED ORDER — ATORVASTATIN CALCIUM 10 MG PO TABS
20.0000 mg | ORAL_TABLET | Freq: Every day | ORAL | Status: DC
  Administered 2022-11-15: 20 mg via ORAL
  Filled 2022-11-14: qty 2

## 2022-11-14 MED ORDER — METOPROLOL SUCCINATE ER 25 MG PO TB24
25.0000 mg | ORAL_TABLET | Freq: Every day | ORAL | Status: DC
  Administered 2022-11-15: 25 mg via ORAL
  Filled 2022-11-14: qty 1

## 2022-11-14 MED ORDER — GADOBUTROL 1 MMOL/ML IV SOLN
10.0000 mL | Freq: Once | INTRAVENOUS | Status: AC | PRN
  Administered 2022-11-14: 10 mL via INTRAVENOUS

## 2022-11-14 MED ORDER — LOSARTAN POTASSIUM 50 MG PO TABS: 100.0000 mg | ORAL_TABLET | Freq: Every day | ORAL | Status: DC

## 2022-11-14 MED ORDER — ADULT MULTIVITAMIN W/MINERALS CH
1.0000 | ORAL_TABLET | Freq: Every morning | ORAL | Status: DC
  Administered 2022-11-15: 1 via ORAL
  Filled 2022-11-14: qty 1

## 2022-11-14 MED ORDER — PANTOPRAZOLE SODIUM 40 MG PO TBEC
40.0000 mg | DELAYED_RELEASE_TABLET | Freq: Every day | ORAL | Status: DC
  Administered 2022-11-15: 40 mg via ORAL
  Filled 2022-11-14: qty 1

## 2022-11-14 MED ORDER — TERBINAFINE HCL 250 MG PO TABS
250.0000 mg | ORAL_TABLET | Freq: Every day | ORAL | Status: DC
  Administered 2022-11-15: 250 mg via ORAL
  Filled 2022-11-14: qty 1

## 2022-11-14 NOTE — Progress Notes (Signed)
  Transition of Care [facility]) Screening Note   Patient Details  Name: Johnny Robinson Date of Birth: 16-Aug-1966   Transition of Care [facility] CM/SW Contact:    Pollie Friar, RN Phone Number: 11/14/2022, 1:19 PM   Pt is from home with his spouse. CIR has started insurance auth for rehab admission. Transition of Care Department [facility]) has reviewed patient. We will continue to monitor patient advancement through interdisciplinary progression rounds. If new patient transition needs arise, please place a TOC consult.

## 2022-11-14 NOTE — Progress Notes (Signed)
Postop day 2.  Patient's neck pain a little bit worse today but still mild overall.  Throat a little bit more sore.  Still with no radiating pain or dysesthesia.  Numbness in left hand weakness persist.  He continues to improve with regard to left lower extremity strength.  He now has 4 to 4+/5 strength in his right quadriceps muscle group.  He has 4/5 strength in his dorsiflexors and 4+/5 strength in his plantar flexors.  He is beginning to get some increased hip flexion strength.  He was able to walk a little bit with therapy today albeit with max assist.  His wound is healing well.  His neck is soft.  Follow-up MRI scan demonstrates signal abnormality within the left side of his spinal cord at C5-6 consistent with contusion/edema.  No evidence of any ongoing compressive pathology.  Anterior reconstruction looks good.  Patient with worsening cervical myelopathy postoperatively following 3 level anterior cervical decompression and fusion.  Continue efforts at therapy and hopefully we will move towards inpatient rehabilitation soon.

## 2022-11-14 NOTE — PMR Pre-admission (Signed)
PMR Admission Coordinator Pre-Admission Assessment  Patient: Johnny Robinson is an 57 y.o., male MRN: [id number] DOB: May 22, 1966 Height: 6'1" Weight: 93 kg               Insurance Information HMO:     PPO: yes     PCP:      IPA:      80/20:      OTHER:  PRIMARY: Zeb Comfort      Policy#: [id number]      Subscriber: Pt's wife CM Name: Claiborne Billings      Phone#: [phone]      Fax#: [phone] Pre-Cert#: [id number]  approved 2/29 to 11/21/22 with update due on 11/21/22    Employer:  Benefits:  Phone #: Albesa Seen     Name:  Irene Shipper Date: 09/17/2022 - still active  Deductible: $500 ($485.25 met) DED included in OOP OOP Max: $7,900 ($485.36mt) DED included in OOP CIR: 80% coverage; 20% co-insurance SNF: 80% coverage; 20% co-insurance; limited to 120 days/cal yr Outpatient:  $25 copay/visit Home Health:  80% coverage; 20% co-insurance DME: 80% coverage; 20% co-insurance Providers: in network  SECONDARY:       Policy#:       Phone#:   FDevelopment worker, community       Phone#:   The "Engineer, petroleum for patients in Inpatient Rehabilitation Facilities with attached "Privacy Act SDawsonRecords" was provided and verbally reviewed with: N/A  Emergency Contact Information Contact Information     Name Relation Home Work Mobile   Sanzo,[name] Spouse [phone]  CTubac Father [phone]      Current Medical History  Patient Admitting Diagnosis: Cervical Myelopathy  History of Present Illness:    Pt is 57yo male with past medical history of L THA 6/23, OA, GERD, CKD, HTN, lumbar surgery x3, SDH in 1990's  who presented with L sided weakness after C3-6  outpatient ACDF for cervical myelopathy. MRI with patchy signal abnormality involving the central and L cord and C5-6 concerning for contusion/injury.  Per neurosurgery note: "Pt. is status post 3 level anterior cervical discectomy and fusion for treatment of compressive  cervical myelopathy with radiculopathy. Surgery uncomplicated. Postoperatively the patient awakened from anesthesia with profound left-sided weakness involving his left distal upper extremity and entire left lower extremity. As the patient emerged from anesthesia he began to have some improved function in his left lower extremity which sequentially improved through the day and overnight. The patient had no pain. He had no dysesthesia. He did note some numbness in his left forearm and hand and some mildly decreased sensation in his left lower extremity. He had no right upper extremity symptoms nor did he have any right lower extremity symptoms and has maintained normal function in his right upper and lower extremity. The patient was further observed overnight where he had progressive return of function in his left lower extremity but still had profound weakness in his left grip and intrinsic function and still had proximal left lower extremity weakness preventing mobilization." [facility] and treatment. Pt. Was seen by PT/OT who recommended CR to assist return to PLOF.    Complete NIHSS TOTAL: 7 Glasgow Coma Scale Score: 15  [facility] has been reviewed by the rehabilitation admission coordinator and physician.  Past Medical History  Past Medical History:  Diagnosis Date   Arthritis    Chronic  kidney disease 2016   RENAL INFARCT   GERD (gastroesophageal reflux disease)    Hypertension    Reflux    Subdural hematoma ([place]) 1991    Has the patient had major surgery during 100 days prior to admission? Yes  Family History  family history includes Cancer in his father; Nephrolithiasis in his mother.   Current Medications   Current Facility-Administered Medications:    0.9 %  sodium chloride infusion, 250 mL, Intravenous, Continuous, Pool, Mallie Mussel, MD, Last Rate: 1 mL/hr at  11/13/22 1740, 250 mL at 11/13/22 1740   acetaminophen (TYLENOL) tablet 650 mg, 650 mg, Oral, Q4H PRN **OR** acetaminophen (TYLENOL) suppository 650 mg, 650 mg, Rectal, Q4H PRN, Earnie Larsson, MD   atorvastatin (LIPITOR) tablet 20 mg, 20 mg, Oral, Daily, Pool, Mallie Mussel, MD, 20 mg at 11/15/22 0841   bisacodyl (DULCOLAX) EC tablet 5 mg, 5 mg, Oral, Daily PRN, Earnie Larsson, MD   cyclobenzaprine (FLEXERIL) tablet 10 mg, 10 mg, Oral, TID PRN, Earnie Larsson, MD, 10 mg at 11/14/22 2011   docusate sodium (COLACE) capsule 100 mg, 100 mg, Oral, BID PRN, Earnie Larsson, MD   HYDROcodone-acetaminophen [facility]) 10-325 MG per tablet 2 tablet, 2 tablet, Oral, Q4H PRN, Earnie Larsson, MD, 2 tablet at 11/15/22 1306   HYDROcodone-acetaminophen (NORCO/VICODIN) 5-325 MG per tablet 1 tablet, 1 tablet, Oral, Q4H PRN, Earnie Larsson, MD, 1 tablet at 11/14/22 [id number]   HYDROmorphone (DILAUDID) injection 1 mg, 1 mg, Intravenous, Q2H PRN, Earnie Larsson, MD   menthol-cetylpyridinium (CEPACOL) lozenge 3 mg, 1 lozenge, Oral, PRN **OR** phenol (CHLORASEPTIC) mouth spray 1 spray, 1 spray, Mouth/Throat, PRN, Earnie Larsson, MD   metoprolol succinate (TOPROL-XL) 24 hr tablet 25 mg, 25 mg, Oral, Daily, Pool, Mallie Mussel, MD, 25 mg at 11/15/22 0841   multivitamin with minerals tablet 1 tablet, 1 tablet, Oral, q morning, Pool, Mallie Mussel, MD, 1 tablet at 11/15/22 0841   ondansetron (ZOFRAN) tablet 4 mg, 4 mg, Oral, Q6H PRN **OR** ondansetron (ZOFRAN) injection 4 mg, 4 mg, Intravenous, Q6H PRN, Pool, Mallie Mussel, MD   pantoprazole (PROTONIX) EC tablet 40 mg, 40 mg, Oral, Daily, Pool, Mallie Mussel, MD, 40 mg at 11/15/22 0841   sodium chloride flush (NS) 0.9 % injection 3 mL, 3 mL, Intravenous, Q12H, Pool, Mallie Mussel, MD, 3 mL at 11/15/22 0843   sodium chloride flush (NS) 0.9 % injection 3 mL, 3 mL, Intravenous, PRN, Earnie Larsson, MD   terbinafine (LAMISIL) tablet 250 mg, 250 mg, Oral, Daily, Earnie Larsson, MD, 250 mg at 11/15/22 [id number]  Patients Current Diet:  Diet Order             Diet  regular Room service appropriate? Yes; Fluid consistency: Thin  Diet effective now                   Precautions / Restrictions Precautions Precautions: Fall, Cervical Precaution Booklet Issued: Yes (comment) Precaution Comments: reviewed cervical precautions Cervical Brace: Soft collar Restrictions Weight Bearing Restrictions: No   Has the patient had 2 or more falls or a fall with injury in the past year?No  Prior Activity Level Community (5-7x/wk): Pt. was working and active in the community PTA  Prior Functional Level Prior Function Prior Level of Function : Independent/Modified Independent Mobility Comments: independent ADLs Comments: independent  Self Care: Did the patient need help bathing, dressing, using the toilet or eating?  Independent  Indoor Mobility: Did the patient need assistance with walking from room to room (with or without device)? Independent  Stairs: Did the patient  need assistance with internal or external stairs (with or without device)? Independent  Functional Cognition: Did the patient need help planning regular tasks such as shopping or remembering to take medications? Independent  Patient Information Are you of Hispanic, Latino/a,or Spanish origin?: A. No, not of Hispanic, Latino/a, or Spanish origin What is your race?: A. White Do you need or want an interpreter to communicate with a doctor or health care staff?: 0. No  Patient's Response To:  Health Literacy and Transportation Is the patient able to respond to health literacy and transportation needs?: Yes Health Literacy - How often do you need to have someone help you when you read instructions, pamphlets, or other written material from your doctor or pharmacy?: Never In the past 12 months, has lack of transportation kept you from medical appointments or from getting medications?: No In the past 12 months, has lack of transportation kept you from meetings, work, or from getting things  needed for daily living?: No  Development worker, international aid / [place] Devices/Equipment: None Home Equipment: Conservation officer, nature (2 wheels)  Prior Device Use: Indicate devices/aids used by the patient prior to current illness, exacerbation or injury? None of the above  Current Functional Level Cognition  Overall Cognitive Status: Within Functional Limits for tasks assessed Orientation Level: Oriented X4    Extremity Assessment (includes Sensation/Coordination)  Upper Extremity Assessment: LUE deficits/detail LUE Deficits / Details: WFL AROM (within precautions) shoulder, elbow, forearm with MMT grossly 3/5.  Distally weaker with 3-/5 wrist extension and 0/5 hand flexion/extension. Decreased sensation.  Able to keep UE on RW during transfers but no grasp. LUE Sensation: decreased light touch LUE Coordination: decreased fine motor, decreased gross motor  Lower Extremity Assessment: Defer to PT evaluation LLE Deficits / Details: hip flex 0/5, knee ext 2/5, knee flex 3/5ankle df 3/5, pf 4/5 LLE Sensation: decreased light touch, decreased proprioception LLE Coordination: decreased gross motor, decreased fine motor    ADLs  Overall ADL's : Needs assistance/impaired Grooming: Set up, Sitting Upper Body Dressing : Minimal assistance, Sitting Lower Body Dressing: Maximal assistance, +2 for physical assistance, +2 for safety/equipment, Sit to/from stand Toilet Transfer: Maximal assistance Toilet Transfer Details (indicate cue type and reason): simulated to recliner Functional mobility during ADLs: Minimal assistance, Moderate assistance, +2 for safety/equipment, +2 for physical assistance, Cueing for sequencing General ADL Comments: focused on LUE HEP    Mobility  Overal bed mobility: Needs Assistance Bed Mobility: Sit to Sidelying Rolling: Min assist Sidelying to sit: Min assist, HOB elevated Sit to supine: Min assist Sit to sidelying: Min assist, HOB elevated General bed  mobility comments: MinA to manage legs with transition sit > sidelying to supine, cuing pt to lean laterally onto elbow to control trunk descent    Transfers  Overall transfer level: Needs assistance Equipment used: Rolling walker (2 wheels) Transfers: Sit to/from Stand Sit to Stand: Min assist Bed to/from chair/wheelchair/BSC transfer type:: Stand pivot Stand pivot transfers: Max assist General transfer comment: Pt cued to place L hand on RW and R on recliner armrest to push up to stand, minA for stability, x2 reps    Ambulation / Gait / Stairs / Wheelchair Mobility  Ambulation/Gait Ambulation/Gait assistance: Mod assist, Min assist Gait Distance (Feet): 48 Feet (x2 bouts of ~48 ft > ~26 ft) Assistive device: Rolling walker (2 wheels) Gait Pattern/deviations: Step-through pattern, Decreased step length - left, Decreased dorsiflexion - left, Decreased stride length, Decreased stance time - left, Decreased weight shift to left, Knees buckling, Trunk  flexed, Narrow base of support General Gait Details: Pt demonstrating improved UE utilization to maintain stability when ambulating, even when his L knee would begin to buckle. MinA needed for stability, but up to modA needed for providing tactile and verbal cues to extend his L knee during stance and advance his L during step phase and verbal cues to look superiorly, improve his upright posture, and perform L heel strike initial contact. Mod success in carrying over these cues with faded feedback provided. Chair follow for safety Gait velocity: decreased Gait velocity interpretation: <1.31 ft/sec, indicative of household ambulator    Posture / Balance Dynamic Sitting Balance Sitting balance - Comments: statically supervision Balance Overall balance assessment: Needs assistance Sitting-balance support: No upper extremity supported, Feet supported Sitting balance-Leahy Scale: Fair Sitting balance - Comments: statically supervision Postural  control: Right lateral lean Standing balance support: Bilateral upper extremity supported, During functional activity Standing balance-Leahy Scale: Poor Standing balance comment: relies on BUE and external support    Special needs/care consideration Special service needs spinal precautions, soft collar brace     Previous Home Environment (from acute therapy documentation) Living Arrangements: Spouse/significant other Available Help at Discharge: Family, Available PRN/intermittently Type of Home: House Home Layout: Multi-level, Bed/bath upstairs Alternate Level Stairs-Rails: Right Alternate Level Stairs-Number of Steps: 15 Home Access: Stairs to enter Entrance Stairs-Rails: None Entrance Stairs-Number of Steps: 4 Bathroom Shower/Tub: Multimedia programmer: Standard Home Care Services: No 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 [employer]  Discharge Living Setting Plans for Discharge Living Setting: Patient's home Type of Home at Discharge: House Discharge Home Layout: Able to live on main level with bedroom/bathroom, Multi-level Alternate Level Stairs-Rails: Right Alternate Level Stairs-Number of Steps: 15 Discharge Home Access: Stairs to enter Entrance Stairs-Rails: None Entrance Stairs-Number of Steps: 4 Discharge Bathroom Shower/Tub: Walk-in shower Discharge Bathroom Toilet: Standard Discharge Bathroom Accessibility: Yes How Accessible: Accessible via walker Does the patient have any problems obtaining your medications?: No  Social/Family/Support Systems Patient Roles: Spouse Contact Information: [phone] Anticipated Caregiver: Tobius Soni (wife) is Therapist, sports and can provide up to min-mod A Ability/Limitations of Caregiver: min-mod A Caregiver Availability: 24/7 Discharge Plan Discussed with Primary Caregiver: Yes Is Caregiver In Agreement with Plan?: Yes Does Caregiver/Family have Issues with Lodging/Transportation while Pt is in Rehab?:  No   Goals Patient/Family Goal for Rehab: PT/OT Mod I goals Expected length of stay: 10-14 days Pt/Family Agrees to Admission and willing to participate: Yes Program Orientation Provided & Reviewed with Pt/Caregiver Including Roles  & Responsibilities: No   Decrease burden of Care through IP rehab admission: Othern/a    Possible need for SNF placement upon discharge:none    Patient Condition: This patient's condition remains as documented in the consult dated 11/14/22, in which the Rehabilitation Physician determined and documented that the patient's condition is appropriate for intensive rehabilitative care in an inpatient rehabilitation facility. Will admit to inpatient rehab today.  Preadmission Screen Completed By:  Retta Diones, RN, 11/15/2022 2:09 PM ______________________________________________________________________   Discussed status with Dr. Tressa Busman 2/29/23at 15:30 and received approval for admission today.  Admission Coordinator:  Retta Diones, time1409/Date2/29/24

## 2022-11-14 NOTE — Progress Notes (Signed)
Physical Therapy Treatment Patient Details Name: Johnny Robinson MRN: [id number] DOB: 1966/04/11 Today's Date: 11/14/2022   History of Present Illness Pt is 57 yo male who presents with L sided weakness after C3-6 ACDF for cervical myelopathy. MRI with patchy signal abnormality involving the central and L cord and C5-6 concerning for contusion/injury. PMH: L THA 6/23, OA, GERD, CKD, HTN, lumbar surgery x3, SDH in 1990's.    PT Comments    Pt is making good progress with functional mobility and gradual progress with L lower extremity strengthening, now demonstrating 1/5 L hip flexion MMT strength. He was able to progress to ambulating up to ~18 ft with a RW and modAx2 today, needing assistance and cues to advance his L foot during swing phase and stabilize his L knee during stance phase. Educated pt on exercises to increase his L quads and hip flexors strength while seated in chair. Current recommendations remain appropriate. Will continue to follow acutely.    Recommendations for follow up therapy are one component of a multi-disciplinary discharge planning process, led by the attending physician.  Recommendations may be updated based on patient status, additional functional criteria and insurance authorization.  Follow Up Recommendations  Acute inpatient rehab (3hours/day)     Assistance Recommended at Discharge Frequent or constant Supervision/Assistance  Patient can return home with the following A lot of help with walking and/or transfers;A lot of help with bathing/dressing/bathroom;Assistance with cooking/housework;Assist for transportation;Help with stairs or ramp for entrance   Equipment Recommendations  BSC/3in1;Wheelchair (measurements PT);Wheelchair cushion (measurements PT) (pending progress)    Recommendations for Other Services       Precautions / Restrictions Precautions Precautions: Fall;Cervical Precaution Booklet Issued: Yes (comment) Precaution Comments: reviewed cervical  precautions Required Braces or Orthoses: Cervical Brace Cervical Brace: Soft collar Restrictions Weight Bearing Restrictions: No     Mobility  Bed Mobility Overal bed mobility: Needs Assistance Bed Mobility: Rolling, Sidelying to Sit Rolling: Min assist Sidelying to sit: Min assist, HOB elevated       General bed mobility comments: cueing for technique, rolling towards R side with assist to manage L LE off EOB and assist trunk upright    Transfers Overall transfer level: Needs assistance Equipment used: Rolling walker (2 wheels) Transfers: Sit to/from Stand Sit to Stand: Min assist, +2 physical assistance, +2 safety/equipment           General transfer comment: to power up and steady from EOB, cueing for hand placement and safety/posture along with feet placement prior to transferring    Ambulation/Gait Ambulation/Gait assistance: Mod assist, +2 physical assistance, +2 safety/equipment Gait Distance (Feet): 18 Feet Assistive device: Rolling walker (2 wheels) Gait Pattern/deviations: Step-through pattern, Decreased step length - left, Decreased dorsiflexion - left, Decreased stride length, Decreased stance time - left, Decreased weight shift to left, Knees buckling, Trunk flexed Gait velocity: decreased Gait velocity interpretation: <1.31 ft/sec, indicative of household ambulator   General Gait Details: Pt needing tactile cues and assistance to advance his L foot to step and to extend his L knee during stance due to noted knee buckling. Faded cues as distance progressed with noted good carryover. Needs reminders to push up through RW and extend his trunk to stand upright. Up to modAx2 for L leg management and stability   Stairs             Wheelchair Mobility    Modified Rankin (Stroke Patients Only)       Balance Overall balance assessment: Needs assistance Sitting-balance support: No  upper extremity supported, Feet supported Sitting balance-Leahy Scale:  Fair Sitting balance - Comments: statically supervision   Standing balance support: Bilateral upper extremity supported, During functional activity Standing balance-Leahy Scale: Poor Standing balance comment: relies on BUE and external support                            Cognition Arousal/Alertness: Awake/alert Behavior During Therapy: WFL for tasks assessed/performed Overall Cognitive Status: Within Functional Limits for tasks assessed                                          Exercises General Exercises - Lower Extremity Long Arc Quad: AROM, Strengthening, Left, 10 reps, Seated (with eccentric control; educated to perform as HEP) Hip Flexion/Marching:  (educated pt on AAROM hip flexion seated in recliner as HEP)    General Comments General comments (skin integrity, edema, etc.): educated on positioning of L UE, exercises including (prayer stretch to L wrist and fingers, shoulder flexion to 90, elbow flexion, and supination with controlled L wrist at neutral)      Pertinent Vitals/Pain Pain Assessment Pain Assessment: Faces Faces Pain Scale: Hurts a little bit Pain Location: neck, incisional Pain Descriptors / Indicators: Operative site guarding, Discomfort Pain Intervention(s): Limited activity within patient's tolerance, Monitored during session, Repositioned    Home Living Family/patient expects to be discharged to:: Private residence Living Arrangements: Spouse/significant other Available Help at Discharge: Family;Available PRN/intermittently Type of Home: House Home Access: Stairs to enter Entrance Stairs-Rails: None Entrance Stairs-Number of Steps: 4 Alternate Level Stairs-Number of Steps: 15 Home Layout: Multi-level;Bed/bath upstairs Home Equipment: Conservation officer, nature (2 wheels) 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 [employer]    Prior Function            PT Goals (current goals can now be found in the care plan  section) Acute Rehab PT Goals Patient Stated Goal: to get to chair today PT Goal Formulation: With patient Time For Goal Achievement: 11/27/22 Potential to Achieve Goals: Good Progress towards PT goals: Progressing toward goals    Frequency    Min 5X/week      PT Plan Equipment recommendations need to be updated    Co-evaluation PT/OT/SLP Co-Evaluation/Treatment: Yes Reason for Co-Treatment: For patient/therapist safety;To address functional/ADL transfers PT goals addressed during session: Mobility/safety with mobility;Balance;Proper use of DME;Strengthening/ROM        AM-PAC PT "6 Clicks" Mobility   Outcome Measure  Help needed turning from your back to your side while in a flat bed without using bedrails?: A Little Help needed moving from lying on your back to sitting on the side of a flat bed without using bedrails?: A Little Help needed moving to and from a bed to a chair (including a wheelchair)?: Total Help needed standing up from a chair using your arms (e.g., wheelchair or bedside chair)?: Total Help needed to walk in hospital room?: Total Help needed climbing 3-5 steps with a railing? : Total 6 Click Score: 10    End of Session Equipment Utilized During Treatment: Gait belt Activity Tolerance: Patient tolerated treatment well Patient left: in chair;with call bell/phone within reach;with chair alarm set Nurse Communication: Mobility status PT Visit Diagnosis: Unsteadiness on feet (R26.81);Difficulty in walking, not elsewhere classified (R26.2);Pain;Other abnormalities of gait and mobility (R26.89);Muscle weakness (generalized) (M62.81) Pain - Right/Left: Left Pain -  part of body: Arm;Leg     Time: [id number] PT Time Calculation (min) (ACUTE ONLY): 25 min  Charges:  $Gait Training: 8-22 mins                     Moishe Spice, PT, DPT Acute Rehabilitation Services  Office: [place] 11/14/2022, 11:57 AM

## 2022-11-14 NOTE — Progress Notes (Signed)
Inpatient Rehab Admissions Coordinator:   I spoke with pt. And wife regarding potential CIR admit. They state that they are interested and wife is an Therapist, sports and able to provide 24/7 care. She can provide min-mod A, but prefers that he be able to stand and ambulate without physical assist. Discussed a liklely 2 week stay with supervision to min A goals. Pt. And family are in agreement. I will open a case with Pt.'s insurance and pursue for auth.   Clemens Catholic, [place], [place] Admissions Coordinator  [phone] ([place]) [phone] (office)

## 2022-11-14 NOTE — Consult Note (Signed)
Physical Medicine and Rehabilitation Consult Reason for Consult: Cervical myelopathy s/p C3-C6 ACDF Referring Physician: Earnie Larsson, MD   HPI: Johnny Robinson is a 57 y.o. male who presents with left sided weakness after C3-C6 ACDF for cervical myelopathy. MRI shows patchy signal abnormality involving the central and L cord and C5-C6 concerning for contusion/injury. PMH is significant for L THA 6/23, OA, GERD< CKD, HTN, lumbar surgery x3, SHD in 1990s.    ROS +left lower extremity weakness Past Medical History:  Diagnosis Date   Arthritis    Chronic kidney disease 2016   RENAL INFARCT   GERD (gastroesophageal reflux disease)    Hypertension    Reflux    Subdural hematoma ([place]) 1991   Past Surgical History:  Procedure Laterality Date   BACK SURGERY  11/2017   COLONOSCOPY WITH PROPOFOL N/A 09/02/2018   Procedure: COLONOSCOPY WITH PROPOFOL;  Surgeon: Lucilla Lame, MD;  Location: [facility];  Service: Endoscopy;  Laterality: N/A;   EXCISION MASS ABDOMINAL Left 05/28/2018   Procedure: EXCISIONAL BIOPSY MASS ABDOMINAL WALL;  Surgeon: Herbert Pun, MD;  Location: [facility];  Service: General;  Laterality: Left;   PERIPHERAL VASCULAR CATHETERIZATION Right 05/18/2015   Procedure: Renal Angiography;  Surgeon: Algernon Huxley, MD;  Location: [place] [facility];  Service: Cardiovascular;  Laterality: Right;   PERIPHERAL VASCULAR CATHETERIZATION Bilateral 05/18/2015   Procedure: Renal Intervention;  Surgeon: Algernon Huxley, MD;  Location: [facility];  Service: Cardiovascular;  Laterality: Bilateral;   TOTAL HIP ARTHROPLASTY Left 03/07/2022   Procedure: TOTAL HIP ARTHROPLASTY ANTERIOR APPROACH;  Surgeon: Gaynelle Arabian, MD;  Location: [facility];  Service: Orthopedics;  Laterality: Left;   Family History  Problem Relation Age of Onset   Nephrolithiasis Mother    Cancer Father    Social History:  reports that he has never smoked. He has never used smokeless tobacco. He reports  current alcohol use. He reports that he does not use drugs. Allergies: No Known Allergies Medications Prior to Admission  Medication Sig Dispense Refill   acetaminophen (TYLENOL) 500 MG tablet Take 500-1,000 mg by mouth every 6 (six) hours as needed (pain).     atorvastatin (LIPITOR) 20 MG tablet Take 1 tablet (20 mg total) by mouth once daily (Patient not taking: Reported on 02/19/2022) 90 tablet 3   atorvastatin (LIPITOR) 20 MG tablet Take 1 tablet (20 mg total) by mouth once daily 90 tablet 3   cyclobenzaprine (FLEXERIL) 10 MG tablet Take 1 tablet (10 mg total) by mouth 3 (three) times daily as needed for muscle spasms 30 tablet 0   HYDROcodone-acetaminophen (NORCO/VICODIN) 5-325 MG tablet Take 1 to 2 tablets by mouth every 6 (six) hours as needed for severe pain. 42 tablet 0   HYDROcodone-acetaminophen (NORCO/VICODIN) 5-325 MG tablet Take 1 tablet by mouth every 4 (four) hours as needed for pain 30 tablet 0   ibuprofen (ADVIL) 800 MG tablet Take 1 tablet (800 mg total) by mouth every 6 (six) hours as needed. 60 tablet 1   losartan (COZAAR) 100 MG tablet TAKE 1 TABLET BY MOUTH ONCE DAILY (Patient not taking: Reported on 02/19/2022) 90 tablet 3   losartan (COZAAR) 100 MG tablet Take 1 tablet (100 mg total) by mouth once daily 90 tablet 3   methocarbamol (ROBAXIN) 500 MG tablet Take 1 tablet (500 mg total) by mouth every 6 (six) hours as needed for muscle spasms. 40 tablet 0   methocarbamol (ROBAXIN) 500 MG tablet Take 1  tablet by mouth every 6 hours as needed for muscle spasms 40 tablet 0   methylPREDNISolone (MEDROL DOSEPAK) 4 MG TBPK tablet Take as directed per package directions 21 tablet 0   metoprolol succinate (TOPROL-XL) 25 MG 24 hr tablet Take 1 tablet (25 mg total) by mouth once daily 90 tablet 3   metoprolol succinate (TOPROL-XL) 25 MG 24 hr tablet Take 1 tablet (25 mg total) by mouth once daily (Patient not taking: Reported on 02/19/2022) 90 tablet 3   Multiple Vitamin (MULTIVITAMIN WITH  MINERALS) TABS tablet Take 1 tablet by mouth in the morning.     omeprazole (PRILOSEC) 40 MG capsule TAKE ONE CAPSULE BY MOUTH EVERY MORNING 90 capsule 3   omeprazole (PRILOSEC) 40 MG capsule Take 1 capsule (40 mg total) by mouth once daily 90 capsule 3   oxyCODONE-acetaminophen (PERCOCET) 5-325 MG tablet Take 1 tablet by mouth every 4 (four) hours as needed for severe pain. 30 tablet 0   predniSONE (DELTASONE) 20 MG tablet Take 1 tablet (20 mg total) by mouth daily. 7 tablet 0   terbinafine (LAMISIL) 250 MG tablet Take 1 tablet by mouth once a day 30 tablet 3   terbinafine (LAMISIL) 250 MG tablet Take 1 tablet (250 mg total) by mouth daily. 30 tablet 3   traMADol (ULTRAM) 50 MG tablet Take 1-2 tablets (50-100 mg total) by mouth every 6 (six) hours as needed for moderate pain. 40 tablet 0   traMADol (ULTRAM) 50 MG tablet Take 1 to 2 tablets by mouth every 6 hours as needed for moderate pain. 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 [employer]  Functional History: Prior Function Prior Level of Function : Independent/Modified Independent Mobility Comments: independent ADLs Comments: independent Functional Status:  Mobility: Bed Mobility Overal bed mobility: Needs Assistance Bed Mobility: Rolling, Sidelying to Sit Rolling: Min assist Sidelying to sit: Min assist, HOB elevated Sit to supine: Min assist General bed mobility  comments: cueing for technique, rolling towards R side with assist to manage L LE off EOB and assist trunk upright Transfers Overall transfer level: Needs assistance Equipment used: Rolling walker (2 wheels) Transfers: Sit to/from Stand Sit to Stand: Min assist, +2 physical assistance, +2 safety/equipment General transfer comment: to power up and steady from EOB, cueing for hand placement and safety/posture along with feet placement prior to transferring Ambulation/Gait Ambulation/Gait assistance: Mod assist, +2 physical assistance, +2 safety/equipment Gait Distance (Feet): 18 Feet Assistive device: Rolling walker (2 wheels) Gait Pattern/deviations: Step-through pattern, Decreased step length - left, Decreased dorsiflexion - left, Decreased stride length, Decreased stance time - left, Decreased weight shift to left, Knees buckling, Trunk flexed General Gait Details: Pt needing tactile cues and assistance to advance his L foot to step and to extend his L knee during stance due to noted knee buckling. Faded cues as distance progressed with noted good carryover. Needs reminders to push up through RW and extend his trunk to stand upright. Up to modAx2 for L leg management and stability Gait velocity: decreased Gait velocity interpretation: <1.31 ft/sec, indicative of  household ambulator    ADL: ADL Overall ADL's : Needs assistance/impaired Grooming: Set up, Sitting Upper Body Dressing : Minimal assistance, Sitting Lower Body Dressing: Maximal assistance, +2 for physical assistance, +2 for safety/equipment, Sit to/from stand Toilet Transfer: Moderate assistance, Minimal assistance, +2 for physical assistance, +2 for safety/equipment, Ambulation, Rolling walker (2 wheels) Toilet Transfer Details (indicate cue type and reason): simulated to recliner Functional mobility during ADLs: Minimal assistance, Moderate assistance, +2 for safety/equipment, +2 for physical assistance, Cueing for  sequencing General ADL Comments: pt limited by decreased functional use on L side  Cognition: Cognition Overall Cognitive Status: Within Functional Limits for tasks assessed Orientation Level: Oriented X4 Cognition Arousal/Alertness: Awake/alert Behavior During Therapy: WFL for tasks assessed/performed Overall Cognitive Status: Within Functional Limits for tasks assessed  Blood pressure 108/62, pulse 77, temperature 98.2 F (36.8 C), resp. rate 16, SpO2 96 %. Physical Exam Gen: no distress, normal appearing HEENT: oral mucosa pink and moist, NCAT Cardio: Reg rate Chest: normal effort, normal rate of breathing Abd: soft, non-distended Ext: no edema Psych: pleasant, normal affect Skin: intact Neuro: Alert and oriented x3 MSK: 1/5 left sided hip flexion, otherwise 0/5 in LLE. Ambulated with RW modA x2, 4/5 strength in right lower extremity  Results for orders placed or performed during the hospital encounter of 11/13/22 (from the past 24 hour(s))  Glucose, capillary     Status: Abnormal   Collection Time: 11/13/22  3:48 PM  Result Value Ref Range   Glucose-Capillary 150 (H) 70 - 99 mg/dL   Comment 1 Notify RN    Comment 2 Document in Chart    MR CERVICAL SPINE W WO CONTRAST  Result Date: 11/14/2022 CLINICAL DATA:  Initial evaluation for left-sided weakness status post cervical ACDF. EXAM: MRI CERVICAL SPINE WITHOUT AND WITH CONTRAST TECHNIQUE: Multiplanar and multiecho pulse sequences of the cervical spine, to include the craniocervical junction and cervicothoracic junction, were obtained without and with intravenous contrast. CONTRAST:  28m GADAVIST GADOBUTROL 1 MMOL/ML IV SOLN COMPARISON:  Comparison made with prior MRI from 10/24/2022. FINDINGS: Alignment: Straightening of the normal cervical lordosis. No listhesis. Vertebrae: Postoperative changes from prior ACDF at C3 through C6. Hardware appears appropriately positioned. Vertebral body height maintained with no visible acute  or chronic fracture. Bone marrow signal intensity within normal limits. No worrisome osseous lesions. No abnormal marrow edema or enhancement. Cord: Patchy signal abnormality involving the central and left cord at the level of C5-6, concerning for contusion/injury (series 9, images 25-28). Cord is somewhat swollen at this level (series 5, image 9). No abnormal enhancement. Remainder of the visualized cord otherwise demonstrates a normal signal and morphology. No epidural hematoma or other collection. Posterior Fossa, vertebral arteries, paraspinal tissues: Visualized brain and posterior fossa within normal limits. Craniocervical junction normal. Diffuse prevertebral edema, felt to be within normal limits for normal expected postoperative changes. No visible loculated collections. Normal flow voids seen within the vertebral arteries bilaterally. Disc levels: C2-C3: Normal interspace. Mild left-sided facet hypertrophy. No canal or foraminal stenosis. C3-C4: Prior ACDF. No significant residual spinal stenosis. Foramina remain patent. C4-C5: Prior ACDF. No significant residual spinal stenosis. Foramina remain patent. C5-C6: Prior ACDF. Residual mild flattening of the ventral thecal sac, asymmetric to the left, but no significant spinal stenosis. Foramina remain patent. C6-C7: Mild diffuse disc bulge with endplate and uncovertebral spurring. Flattening of the ventral thecal sac without significant spinal stenosis. Moderate bilateral C7 foraminal stenosis, slightly worse on the left. C7-T1: Normal interspace. Left greater than right facet hypertrophy.  No spinal stenosis. Foramina remain patent. IMPRESSION: 1. Postoperative changes from prior ACDF at C3 through C6 without significant residual spinal stenosis. 2. Patchy signal abnormality involving the central and left cord at the level of C5-6, concerning for contusion/injury. 3. Disc bulge with uncovertebral spurring at C6-7 with resultant moderate bilateral C7 foraminal  stenosis. Electronically Signed   By: Jeannine Boga M.D.   On: 11/14/2022 00:54    Assessment/Plan: Diagnosis: Cervical myelopathy s/p C3-C6 ACDF Does the need for close, 24 hr/day medical supervision in concert with the patient's rehab needs make it unreasonable for this patient to be served in a less intensive setting? Yes Co-Morbidities requiring supervision/potential complications:  1) HTN: currently well controlled, continue to monitor TID 2) CKD: last creatinine reviewed and wnl 3) OA: can consider voltaren gel if having knee pain 4) GERD 5) Hyperglycemia Due to bladder management, bowel management, safety, skin/wound care, disease management, medication administration, pain management, and patient education, does the patient require 24 hr/day rehab nursing? Yes Does the patient require coordinated care of a physician, rehab nurse, therapy disciplines of PT, OT to address physical and functional deficits in the context of the above medical diagnosis(es)? Yes Addressing deficits in the following areas: balance, endurance, locomotion, strength, transferring, bowel/bladder control, bathing, dressing, feeding, grooming, toileting, and psychosocial support Can the patient actively participate in an intensive therapy program of at least 3 hrs of therapy per day at least 5 days per week? Yes The potential for patient to make measurable gains while on inpatient rehab is excellent Anticipated functional outcomes upon discharge from inpatient rehab are modified independent  with PT, modified independent with OT, independent with SLP. Estimated rehab length of stay to reach the above functional goals is: 10-14 days Anticipated discharge destination: Home Overall Rehab/Functional Prognosis: excellent  POST ACUTE RECOMMENDATIONS: This patient's condition is appropriate for continued rehabilitative care in the following setting: CIR Patient has agreed to participate in recommended program.  Yes Note that insurance prior authorization may be required for reimbursement for recommended care.    I have personally performed a face to face diagnostic evaluation of this patient. Additionally, I have examined the patient's medical record including any pertinent labs and radiographic images. If the physician assistant has documented in this note, I have reviewed and edited or otherwise concur with the physician assistant's documentation.  Thanks,  Izora Ribas, MD 11/14/2022

## 2022-11-14 NOTE — Evaluation (Signed)
Occupational Therapy Evaluation Patient Details Name: Johnny Robinson MRN: [id number] DOB: 11-Jun-1966 Today's Date: 11/14/2022   History of Present Illness Pt is 57 yo male who presents with L sided weakness after C3-6 ACDF for cervical myelopathy. MRI with patchy signal abnormality involving the central and L cord and C5-6 concerning for contusion/injury. PMH: L THA 6/23, OA, GERD, CKD, HTN, lumbar surgery x3, SDH in 1990's.   Clinical Impression   PTA patient independent and working. Admitted for above and presents with problem list below, including L sided weakness/impaired sensation, impaired balance and decrased activity tolerance.  He requires setup to max assist for ADLs, min-mod assist +2 for functional mobility using RW and min assist for bed mobility.  Educated on exercises and positioning of L UE, posture, safety, ADL compensatory techniques and precautions/brace. Will monitor for splinting needs to L hand. Recommend AIR to optimize independence and safety for return to PLOF.       Recommendations for follow up therapy are one component of a multi-disciplinary discharge planning process, led by the attending physician.  Recommendations may be updated based on patient status, additional functional criteria and insurance authorization.   Follow Up Recommendations  Acute inpatient rehab (3hours/day)     Assistance Recommended at Discharge Intermittent Supervision/Assistance  Patient can return home with the following Two people to help with walking and/or transfers;A lot of help with bathing/dressing/bathroom;Assistance with cooking/housework;Assist for transportation;Help with stairs or ramp for entrance    Functional Status Assessment  Patient has had a recent decline in their functional status and demonstrates the ability to make significant improvements in function in a reasonable and predictable amount of time.  Equipment Recommendations  Other (comment) (defer)    Recommendations  for Other Services Rehab consult     Precautions / Restrictions Precautions Precautions: Fall;Cervical Precaution Booklet Issued: Yes (comment) Precaution Comments: reviewed cervical precautions Required Braces or Orthoses: Cervical Brace Cervical Brace: Soft collar Restrictions Weight Bearing Restrictions: No      Mobility Bed Mobility Overal bed mobility: Needs Assistance Bed Mobility: Rolling, Sidelying to Sit Rolling: Min assist Sidelying to sit: Min assist       General bed mobility comments: cueing for technique, rolling towards R side with assist to manage L LE off EOB and assist trunk upright    Transfers Overall transfer level: Needs assistance Equipment used: Rolling walker (2 wheels) Transfers: Sit to/from Stand Sit to Stand: Min assist, +2 physical assistance, +2 safety/equipment           General transfer comment: to power up and steady from EOB, cueing for hand placement and safety/posture      Balance Overall balance assessment: Needs assistance Sitting-balance support: No upper extremity supported, Feet supported Sitting balance-Leahy Scale: Fair Sitting balance - Comments: statically supervision   Standing balance support: Bilateral upper extremity supported, During functional activity Standing balance-Leahy Scale: Poor Standing balance comment: relies on BUE and external support                           ADL either performed or assessed with clinical judgement   ADL Overall ADL's : Needs assistance/impaired     Grooming: Set up;Sitting           Upper Body Dressing : Minimal assistance;Sitting   Lower Body Dressing: Maximal assistance;+2 for physical assistance;+2 for safety/equipment;Sit to/from stand   Toilet Transfer: Moderate assistance;Minimal assistance;+2 for physical assistance;+2 for safety/equipment;Ambulation;Rolling walker (2 wheels) Toilet Transfer Details (indicate cue  type and reason): simulated to  recliner         Functional mobility during ADLs: Minimal assistance;Moderate assistance;+2 for safety/equipment;+2 for physical assistance;Cueing for sequencing General ADL Comments: pt limited by decreased functional use on L side     Vision   Vision Assessment?: No apparent visual deficits     Perception     Praxis      Pertinent Vitals/Pain Pain Assessment Pain Assessment: Faces Faces Pain Scale: Hurts a little bit Pain Location: neck, incisional Pain Descriptors / Indicators: Operative site guarding, Discomfort Pain Intervention(s): Limited activity within patient's tolerance, Monitored during session, Repositioned     Hand Dominance Right   Extremity/Trunk Assessment Upper Extremity Assessment Upper Extremity Assessment: LUE deficits/detail LUE Deficits / Details: WFL AROM (within precautions) shoulder, elbow, forearm with MMT grossly 3/5.  Distally weaker with 3-/5 wrist extension and 0/5 hand flexion/extension. Decreased sensation.  Able to keep UE on RW during transfers but no grasp. LUE Sensation: decreased light touch LUE Coordination: decreased fine motor;decreased gross motor   Lower Extremity Assessment Lower Extremity Assessment: Defer to PT evaluation   Cervical / Trunk Assessment Cervical / Trunk Assessment: Neck Surgery   Communication Communication Communication: No difficulties   Cognition Arousal/Alertness: Awake/alert Behavior During Therapy: WFL for tasks assessed/performed Overall Cognitive Status: Within Functional Limits for tasks assessed                                       General Comments  educated on positioning of L UE, exercises including (prayer stretch to L wrist and fingers, shoulder flexion to 90, elbow flexion, and supination with controlled L wrist at neutral)    Exercises     Shoulder Instructions      Home Living Family/patient expects to be discharged to:: Private residence Living Arrangements:  Spouse/significant other Available Help at Discharge: Family;Available PRN/intermittently Type of Home: House Home Access: Stairs to enter [employer] of Steps: 4 Entrance Stairs-Rails: None Home Layout: Multi-level;Bed/bath upstairs Alternate Level Stairs-Number of Steps: 15 Alternate Level Stairs-Rails: Right Bathroom Shower/Tub: Occupational psychologist: Standard     Home Equipment: Conservation officer, nature (2 wheels)   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 [employer]      Prior Functioning/Environment Prior Level of Function : Independent/Modified Independent             Mobility Comments: independent ADLs Comments: independent        OT Problem List: Decreased strength;Decreased activity tolerance;Impaired balance (sitting and/or standing);Decreased range of motion;Decreased coordination;Decreased knowledge of use of DME or AE;Decreased knowledge of precautions;Impaired sensation;Impaired tone;Impaired UE functional use;Pain      OT Treatment/Interventions: Self-care/ADL training;Neuromuscular education;DME and/or AE instruction;Therapeutic activities;Balance training;Patient/family education;Splinting;Manual therapy    OT Goals(Current goals can be found in the care plan section) Acute Rehab OT Goals Patient Stated Goal: get my L side working, independence OT Goal Formulation: With patient Time For Goal Achievement: 11/28/22 Potential to Achieve Goals: Good  OT Frequency: Min 3X/week    Co-evaluation              AM-PAC OT "6 Clicks" Daily Activity     Outcome Measure Help from another person eating meals?: A Little Help from another person taking care of personal grooming?: A Little Help from another person toileting, which includes using toliet, bedpan, or urinal?: A Lot Help from another person bathing (including  washing, rinsing, drying)?: A Lot Help from another person to put on and taking off regular upper body  clothing?: A Little Help from another person to put on and taking off regular lower body clothing?: A Lot 6 Click Score: 15   End of Session Equipment Utilized During Treatment: Gait belt;Rolling walker (2 wheels);Cervical collar Nurse Communication: Mobility status  Activity Tolerance: Patient tolerated treatment well Patient left: in chair;with call bell/phone within reach;with chair alarm set  OT Visit Diagnosis: Other abnormalities of gait and mobility (R26.89);Muscle weakness (generalized) (M62.81);Other symptoms and signs involving the nervous system (R29.898)                Time: [id number] OT Time Calculation (min): 25 min Charges:  OT General Charges $OT Visit: 1 Visit OT Evaluation $OT Eval Moderate Complexity: 1 Mod  Jolaine Artist, OT Acute Rehabilitation Services Office [phone]   Delight Stare 11/14/2022, 9:11 AM

## 2022-11-15 ENCOUNTER — Encounter (HOSPITAL_COMMUNITY): Payer: Self-pay | Admitting: Physical Medicine and Rehabilitation

## 2022-11-15 ENCOUNTER — Encounter (HOSPITAL_COMMUNITY): Payer: Self-pay | Admitting: Neurosurgery

## 2022-11-15 ENCOUNTER — Inpatient Hospital Stay (HOSPITAL_COMMUNITY)
Admission: RE | Admit: 2022-11-15 | Discharge: 2022-11-30 | DRG: 056 | Disposition: A | Discharge status: Home / Self Care | Payer: Commercial Managed Care - PPO | Source: Intra-hospital | Attending: Physical Medicine and Rehabilitation | Admitting: Physical Medicine and Rehabilitation

## 2022-11-15 DIAGNOSIS — M50323 Other cervical disc degeneration at C6-C7 level: Secondary | ICD-10-CM | POA: Diagnosis not present

## 2022-11-15 DIAGNOSIS — K219 Gastro-esophageal reflux disease without esophagitis: Secondary | ICD-10-CM | POA: Diagnosis not present

## 2022-11-15 DIAGNOSIS — J189 Pneumonia, unspecified organism: Secondary | ICD-10-CM | POA: Diagnosis not present

## 2022-11-15 DIAGNOSIS — E785 Hyperlipidemia, unspecified: Secondary | ICD-10-CM | POA: Diagnosis present

## 2022-11-15 DIAGNOSIS — G8194 Hemiplegia, unspecified affecting left nondominant side: Principal | ICD-10-CM | POA: Diagnosis present

## 2022-11-15 DIAGNOSIS — E871 Hypo-osmolality and hyponatremia: Secondary | ICD-10-CM | POA: Diagnosis present

## 2022-11-15 DIAGNOSIS — N280 Ischemia and infarction of kidney: Secondary | ICD-10-CM | POA: Diagnosis present

## 2022-11-15 DIAGNOSIS — G894 Chronic pain syndrome: Secondary | ICD-10-CM | POA: Diagnosis present

## 2022-11-15 DIAGNOSIS — M542 Cervicalgia: Secondary | ICD-10-CM | POA: Diagnosis present

## 2022-11-15 DIAGNOSIS — M7918 Myalgia, other site: Secondary | ICD-10-CM | POA: Diagnosis not present

## 2022-11-15 DIAGNOSIS — I10 Essential (primary) hypertension: Secondary | ICD-10-CM | POA: Diagnosis not present

## 2022-11-15 DIAGNOSIS — R3911 Hesitancy of micturition: Secondary | ICD-10-CM | POA: Insufficient documentation

## 2022-11-15 DIAGNOSIS — M791 Myalgia, unspecified site: Secondary | ICD-10-CM | POA: Diagnosis not present

## 2022-11-15 DIAGNOSIS — M5000 Cervical disc disorder with myelopathy, unspecified cervical region: Secondary | ICD-10-CM | POA: Diagnosis not present

## 2022-11-15 DIAGNOSIS — R5381 Other malaise: Secondary | ICD-10-CM | POA: Diagnosis not present

## 2022-11-15 DIAGNOSIS — M7522 Bicipital tendinitis, left shoulder: Secondary | ICD-10-CM | POA: Diagnosis present

## 2022-11-15 DIAGNOSIS — Z79899 Other long term (current) drug therapy: Secondary | ICD-10-CM | POA: Diagnosis not present

## 2022-11-15 DIAGNOSIS — K5900 Constipation, unspecified: Secondary | ICD-10-CM | POA: Diagnosis not present

## 2022-11-15 DIAGNOSIS — G959 Disease of spinal cord, unspecified: Secondary | ICD-10-CM | POA: Diagnosis present

## 2022-11-15 DIAGNOSIS — R059 Cough, unspecified: Secondary | ICD-10-CM | POA: Diagnosis not present

## 2022-11-15 DIAGNOSIS — M25512 Pain in left shoulder: Secondary | ICD-10-CM | POA: Insufficient documentation

## 2022-11-15 MED ORDER — BISACODYL 5 MG PO TBEC: 5.0000 mg | DELAYED_RELEASE_TABLET | Freq: Every day | ORAL | Status: DC | PRN

## 2022-11-15 MED ORDER — BISACODYL 10 MG RE SUPP
10.0000 mg | Freq: Every day | RECTAL | Status: DC | PRN
  Administered 2022-11-17: 10 mg via RECTAL
  Filled 2022-11-15: qty 1

## 2022-11-15 MED ORDER — METOPROLOL SUCCINATE ER 25 MG PO TB24
25.0000 mg | ORAL_TABLET | Freq: Every day | ORAL | Status: DC
  Administered 2022-11-16 – 2022-11-30 (×15): 25 mg via ORAL
  Filled 2022-11-15 (×15): qty 1

## 2022-11-15 MED ORDER — HYDROCODONE-ACETAMINOPHEN 10-325 MG PO TABS
2.0000 | ORAL_TABLET | ORAL | Status: DC | PRN
  Administered 2022-11-16 – 2022-11-19 (×10): 2 via ORAL
  Filled 2022-11-15 (×10): qty 2

## 2022-11-15 MED ORDER — PANTOPRAZOLE SODIUM 40 MG PO TBEC
40.0000 mg | DELAYED_RELEASE_TABLET | Freq: Every day | ORAL | Status: DC
  Administered 2022-11-16 – 2022-11-30 (×15): 40 mg via ORAL
  Filled 2022-11-15 (×15): qty 1

## 2022-11-15 MED ORDER — ALUM & MAG HYDROXIDE-SIMETH 200-200-20 MG/5ML PO SUSP: 30.0000 mL | ORAL | Status: DC | PRN

## 2022-11-15 MED ORDER — ADULT MULTIVITAMIN W/MINERALS CH
1.0000 | ORAL_TABLET | Freq: Every morning | ORAL | Status: DC
  Administered 2022-11-16 – 2022-11-30 (×15): 1 via ORAL
  Filled 2022-11-15 (×15): qty 1

## 2022-11-15 MED ORDER — MENTHOL 3 MG MT LOZG: 1.0000 | LOZENGE | OROMUCOSAL | Status: DC | PRN

## 2022-11-15 MED ORDER — GABAPENTIN 100 MG PO CAPS
100.0000 mg | ORAL_CAPSULE | Freq: Two times a day (BID) | ORAL | Status: DC
  Administered 2022-11-15 – 2022-11-30 (×29): 100 mg via ORAL
  Filled 2022-11-15 (×30): qty 1

## 2022-11-15 MED ORDER — DIPHENHYDRAMINE HCL 12.5 MG/5ML PO ELIX: 12.5000 mg | ORAL_SOLUTION | Freq: Four times a day (QID) | ORAL | Status: DC | PRN

## 2022-11-15 MED ORDER — DOCUSATE SODIUM 100 MG PO CAPS: 100.0000 mg | ORAL_CAPSULE | Freq: Two times a day (BID) | ORAL | Status: DC | PRN

## 2022-11-15 MED ORDER — POLYETHYLENE GLYCOL 3350 17 G PO PACK
17.0000 g | PACK | Freq: Every day | ORAL | Status: DC | PRN
  Administered 2022-11-16 – 2022-11-17 (×2): 17 g via ORAL
  Filled 2022-11-15: qty 1

## 2022-11-15 MED ORDER — PHENOL 1.4 % MT LIQD: 1.0000 | OROMUCOSAL | Status: DC | PRN

## 2022-11-15 MED ORDER — ACETAMINOPHEN 325 MG PO TABS
325.0000 mg | ORAL_TABLET | ORAL | Status: DC | PRN
  Administered 2022-11-16: 650 mg via ORAL
  Filled 2022-11-15 (×4): qty 2

## 2022-11-15 MED ORDER — FLEET ENEMA 7-19 GM/118ML RE ENEM: 1.0000 | ENEMA | Freq: Once | RECTAL | Status: DC | PRN

## 2022-11-15 MED ORDER — PROCHLORPERAZINE 25 MG RE SUPP: 12.5000 mg | Freq: Four times a day (QID) | RECTAL | Status: DC | PRN

## 2022-11-15 MED ORDER — GUAIFENESIN-DM 100-10 MG/5ML PO SYRP: 5.0000 mL | ORAL_SOLUTION | Freq: Four times a day (QID) | ORAL | Status: DC | PRN

## 2022-11-15 MED ORDER — TRAZODONE HCL 50 MG PO TABS
25.0000 mg | ORAL_TABLET | Freq: Every evening | ORAL | Status: DC | PRN
  Filled 2022-11-15: qty 1

## 2022-11-15 MED ORDER — SENNOSIDES-DOCUSATE SODIUM 8.6-50 MG PO TABS
2.0000 | ORAL_TABLET | Freq: Every day | ORAL | Status: DC
  Administered 2022-11-16 – 2022-11-29 (×14): 2 via ORAL
  Filled 2022-11-15 (×16): qty 2

## 2022-11-15 MED ORDER — TERBINAFINE HCL 250 MG PO TABS
250.0000 mg | ORAL_TABLET | Freq: Every day | ORAL | Status: DC
  Administered 2022-11-16 – 2022-11-30 (×15): 250 mg via ORAL
  Filled 2022-11-15 (×16): qty 1

## 2022-11-15 MED ORDER — CYCLOBENZAPRINE HCL 5 MG PO TABS
10.0000 mg | ORAL_TABLET | Freq: Three times a day (TID) | ORAL | Status: DC | PRN
  Administered 2022-11-15 – 2022-11-21 (×4): 10 mg via ORAL
  Filled 2022-11-15 (×5): qty 2

## 2022-11-15 MED ORDER — ATORVASTATIN CALCIUM 10 MG PO TABS
20.0000 mg | ORAL_TABLET | Freq: Every day | ORAL | Status: DC
  Administered 2022-11-16 – 2022-11-30 (×15): 20 mg via ORAL
  Filled 2022-11-15 (×16): qty 2

## 2022-11-15 MED ORDER — PROCHLORPERAZINE EDISYLATE 10 MG/2ML IJ SOLN: 5.0000 mg | Freq: Four times a day (QID) | INTRAMUSCULAR | Status: DC | PRN

## 2022-11-15 MED ORDER — PROCHLORPERAZINE MALEATE 5 MG PO TABS: 5.0000 mg | ORAL_TABLET | Freq: Four times a day (QID) | ORAL | Status: DC | PRN

## 2022-11-15 NOTE — Progress Notes (Signed)
IP rehab admissions - We have approval from [facility] for acute inpatient rehab admission for today.  I called attending MD and I have clearance for admit to CIR today.  Call me for questions.  [phone]

## 2022-11-15 NOTE — TOC Transition Note (Signed)
Transition of Care [facility]) - CM/SW Discharge Note   Patient Details  Name: Johnny Robinson MRN: [id number] Date of Birth: 07-Aug-1966  [facility] CM/SW Contact:  Pollie Friar, RN Phone Number: 11/15/2022, 2:26 PM   Clinical Narrative:    Pt is discharging to CIR today. CM signing off.   Final next level of care: IP Rehab Facility Barriers to Discharge: No Barriers Identified   Patient Goals and CMS Choice CMS Medicare.gov Compare Post Acute Care list provided to:: Patient Choice offered to / list presented to : Patient  Discharge Placement                         Discharge Plan and Services Additional resources added to the After Visit Summary for                                       Social Determinants of Health (SDOH) Interventions SDOH Screenings   Food Insecurity: No Food Insecurity (11/13/2022)  Housing: Low Risk  (11/13/2022)  Transportation Needs: No Transportation Needs (11/13/2022)  Utilities: Not At Risk (11/13/2022)  Tobacco Use: Low Risk  (11/15/2022)     Readmission Risk Interventions     No data to display

## 2022-11-15 NOTE — Progress Notes (Signed)
Physical Therapy Treatment Patient Details Name: Johnny Robinson MRN: [id number] DOB: 04/03/1966 Today's Date: 11/15/2022   History of Present Illness Pt is 57 yo male who presents with L sided weakness after C3-6 ACDF for cervical myelopathy. MRI with patchy signal abnormality involving the central and L cord and C5-6 concerning for contusion/injury. PMH: L THA 6/23, OA, GERD, CKD, HTN, lumbar surgery x3, SDH in 1990's.    PT Comments    Focused session on gait training, with pt demonstrating good progress with advancing gait distance and good carryover of cues provided. Pt continues to display limited L hip flexor strength and L grip strength, but displays good L triceps strength that allows him to push up through the RW (provided his palm is placed well on the RW) to maintain his balance when ambulating and when his L knee buckles. He does require min-modA for balance and cuing to advance his L leg when stepping and provide stability at his L knee during stance phase though. Will continue to follow acutely. Current recommendations remain appropriate.     Recommendations for follow up therapy are one component of a multi-disciplinary discharge planning process, led by the attending physician.  Recommendations may be updated based on patient status, additional functional criteria and insurance authorization.  Follow Up Recommendations  Acute inpatient rehab (3hours/day)     Assistance Recommended at Discharge Frequent or constant Supervision/Assistance  Patient can return home with the following A lot of help with walking and/or transfers;A lot of help with bathing/dressing/bathroom;Assistance with cooking/housework;Assist for transportation;Help with stairs or ramp for entrance   Equipment Recommendations  BSC/3in1;Wheelchair (measurements PT);Wheelchair cushion (measurements PT) (pending progress)    Recommendations for Other Services       Precautions / Restrictions  Precautions Precautions: Fall;Cervical Precaution Booklet Issued: Yes (comment) Precaution Comments: reviewed cervical precautions Required Braces or Orthoses: Cervical Brace Cervical Brace: Soft collar Restrictions Weight Bearing Restrictions: No     Mobility  Bed Mobility Overal bed mobility: Needs Assistance Bed Mobility: Sit to Sidelying         Sit to sidelying: Min assist, HOB elevated General bed mobility comments: MinA to manage legs with transition sit > sidelying to supine, cuing pt to lean laterally onto elbow to control trunk descent    Transfers Overall transfer level: Needs assistance Equipment used: Rolling walker (2 wheels) Transfers: Sit to/from Stand Sit to Stand: Min assist           General transfer comment: Pt cued to place L hand on RW and R on recliner armrest to push up to stand, minA for stability, x2 reps    Ambulation/Gait Ambulation/Gait assistance: Mod assist, Min assist Gait Distance (Feet): 48 Feet (x2 bouts of ~48 ft > ~26 ft) Assistive device: Rolling walker (2 wheels) Gait Pattern/deviations: Step-through pattern, Decreased step length - left, Decreased dorsiflexion - left, Decreased stride length, Decreased stance time - left, Decreased weight shift to left, Knees buckling, Trunk flexed, Narrow base of support Gait velocity: decreased Gait velocity interpretation: <1.31 ft/sec, indicative of household ambulator   General Gait Details: Pt demonstrating improved UE utilization to maintain stability when ambulating, even when his L knee would begin to buckle. MinA needed for stability, but up to modA needed for providing tactile and verbal cues to extend his L knee during stance and advance his L during step phase and verbal cues to look superiorly, improve his upright posture, and perform L heel strike initial contact. Mod success in carrying over these cues  with faded feedback provided. Chair follow for safety   Stairs              Wheelchair Mobility    Modified Rankin (Stroke Patients Only)       Balance Overall balance assessment: Needs assistance Sitting-balance support: No upper extremity supported, Feet supported Sitting balance-Leahy Scale: Fair Sitting balance - Comments: statically supervision   Standing balance support: Bilateral upper extremity supported, During functional activity Standing balance-Leahy Scale: Poor Standing balance comment: relies on BUE and external support                            Cognition Arousal/Alertness: Awake/alert Behavior During Therapy: WFL for tasks assessed/performed Overall Cognitive Status: Within Functional Limits for tasks assessed                                          Exercises      General Comments        Pertinent Vitals/Pain Pain Assessment Pain Assessment: Faces Faces Pain Scale: Hurts little more Pain Location: neck, incisional Pain Descriptors / Indicators: Operative site guarding, Discomfort Pain Intervention(s): Limited activity within patient's tolerance, Monitored during session, Repositioned    Home Living                          Prior Function            PT Goals (current goals can now be found in the care plan section) Acute Rehab PT Goals Patient Stated Goal: to walk PT Goal Formulation: With patient Time For Goal Achievement: 11/27/22 Potential to Achieve Goals: Good Progress towards PT goals: Progressing toward goals    Frequency    Min 5X/week      PT Plan Current plan remains appropriate    Co-evaluation              AM-PAC PT "6 Clicks" Mobility   Outcome Measure  Help needed turning from your back to your side while in a flat bed without using bedrails?: A Little Help needed moving from lying on your back to sitting on the side of a flat bed without using bedrails?: A Little Help needed moving to and from a bed to a chair (including a wheelchair)?: A  Lot Help needed standing up from a chair using your arms (e.g., wheelchair or bedside chair)?: A Little Help needed to walk in hospital room?: A Lot Help needed climbing 3-5 steps with a railing? : Total 6 Click Score: 14    End of Session Equipment Utilized During Treatment: Gait belt Activity Tolerance: Patient tolerated treatment well Patient left: with call bell/phone within reach;in bed;with bed alarm set Nurse Communication: Mobility status PT Visit Diagnosis: Unsteadiness on feet (R26.81);Difficulty in walking, not elsewhere classified (R26.2);Pain;Other abnormalities of gait and mobility (R26.89);Muscle weakness (generalized) (M62.81) Pain - Right/Left: Left Pain - part of body: Arm;Leg     Time: [id number] PT Time Calculation (min) (ACUTE ONLY): 22 min  Charges:  $Gait Training: 8-22 mins                     Moishe Spice, PT, DPT Acute Rehabilitation Services  Office: [place] 11/15/2022, 1:57 PM

## 2022-11-15 NOTE — Progress Notes (Signed)
PMR Admission Coordinator Pre-Admission Assessment   Patient: Johnny Robinson is an 57 y.o., male MRN: [id number] DOB: [birth year] Height: 6'1" Weight: 93 kg                                                                                                                                                    Insurance Information HMO:     PPO: yes     PCP:      IPA:      80/20:      OTHER:  PRIMARY: Zeb Comfort      Policy#: [id number]      Subscriber: Pt's wife CM Name: Claiborne Billings      Phone#: [phone]      Fax#: [phone] Pre-Cert#: [id number]  approved 2/29 to 11/21/22 with update due on 11/21/22    Employer:  Benefits:  Phone #: Albesa Seen     Name:  Irene Shipper Date: 09/17/2022 - still active  Deductible: $500 ($485.25 met) DED included in OOP OOP Max: $7,900 ($485.51mt) DED included in OOP CIR: 80% coverage; 20% co-insurance SNF: 80% coverage; 20% co-insurance; limited to 120 days/cal yr Outpatient:  $25 copay/visit Home Health:  80% coverage; 20% co-insurance DME: 80% coverage; 20% co-insurance Providers: in network  SECONDARY:       Policy#:       Phone#:    FDevelopment worker, community       Phone#:    The "Engineer, petroleum for patients in Inpatient Rehabilitation Facilities with attached "Privacy [place]" was provided and verbally reviewed with: N/A   Emergency Contact Information Contact Information       Name Relation Home Work Mobile    Montanaro,[name] Spouse [phone]   [place] Father [phone]  [phone]         Current Medical History  Patient Admitting Diagnosis: Cervical Myelopathy  History of Present Illness:     Pt is 57yo male with past medical history of L THA 6/23, OA, GERD, CKD, HTN, lumbar surgery x3, SDH in 1990's  who presented with L sided weakness after C3-6  outpatient ACDF for cervical myelopathy. MRI with patchy signal abnormality involving the central and L cord and C5-6 concerning  for contusion/injury.  Per neurosurgery note: "Pt. is status post 3 level anterior cervical discectomy and fusion for treatment of compressive cervical myelopathy with radiculopathy. Surgery uncomplicated. Postoperatively the patient awakened from anesthesia with profound left-sided weakness involving his left distal upper extremity and entire left lower extremity. As the patient emerged from anesthesia he began to have some improved function in his left lower extremity which sequentially improved through the day and overnight. The patient had no pain. He had no dysesthesia. He did note some numbness in his left forearm and hand and some mildly decreased sensation in his left lower  extremity. He had no right upper extremity symptoms nor did he have any right lower extremity symptoms and has maintained normal function in his right upper and lower extremity. The patient was further observed overnight where he had progressive return of function in his left lower extremity but still had profound weakness in his left grip and intrinsic function and still had proximal left lower extremity weakness preventing mobilization." Neurosurgery admitted him to [facility] 2/27 for furhter evaluation and treatment. Pt. Was seen by PT/OT who recommended CR to assist return to PLOF.     Complete NIHSS TOTAL: 7 Glasgow Coma Scale Score: 15   Patient's medical record from [facility]  has been reviewed by the rehabilitation admission coordinator and physician.   Past Medical History      Past Medical History:  Diagnosis Date   Arthritis     Chronic kidney disease 2016    RENAL INFARCT   GERD (gastroesophageal reflux disease)     Hypertension     Reflux     Subdural hematoma ([place]) 1991      Has the patient had major surgery during 100 days prior to admission? Yes   Family History  family history includes Cancer in his father; Nephrolithiasis in his mother.     Current  Medications    Current Facility-Administered Medications:    0.9 %  sodium chloride infusion, 250 mL, Intravenous, Continuous, Pool, Mallie Mussel, MD, Last Rate: 1 mL/hr at 11/13/22 1740, 250 mL at 11/13/22 1740   acetaminophen (TYLENOL) tablet 650 mg, 650 mg, Oral, Q4H PRN **OR** acetaminophen (TYLENOL) suppository 650 mg, 650 mg, Rectal, Q4H PRN, Earnie Larsson, MD   atorvastatin (LIPITOR) tablet 20 mg, 20 mg, Oral, Daily, Pool, Mallie Mussel, MD, 20 mg at 11/15/22 0841   bisacodyl (DULCOLAX) EC tablet 5 mg, 5 mg, Oral, Daily PRN, Earnie Larsson, MD   cyclobenzaprine (FLEXERIL) tablet 10 mg, 10 mg, Oral, TID PRN, Earnie Larsson, MD, 10 mg at 11/14/22 2011   docusate sodium (COLACE) capsule 100 mg, 100 mg, Oral, BID PRN, Earnie Larsson, MD   HYDROcodone-acetaminophen [facility]) 10-325 MG per tablet 2 tablet, 2 tablet, Oral, Q4H PRN, Earnie Larsson, MD, 2 tablet at 11/15/22 1306   HYDROcodone-acetaminophen (NORCO/VICODIN) 5-325 MG per tablet 1 tablet, 1 tablet, Oral, Q4H PRN, Earnie Larsson, MD, 1 tablet at 11/14/22 [id number]   HYDROmorphone (DILAUDID) injection 1 mg, 1 mg, Intravenous, Q2H PRN, Earnie Larsson, MD   menthol-cetylpyridinium (CEPACOL) lozenge 3 mg, 1 lozenge, Oral, PRN **OR** phenol (CHLORASEPTIC) mouth spray 1 spray, 1 spray, Mouth/Throat, PRN, Earnie Larsson, MD   metoprolol succinate (TOPROL-XL) 24 hr tablet 25 mg, 25 mg, Oral, Daily, Pool, Mallie Mussel, MD, 25 mg at 11/15/22 0841   multivitamin with minerals tablet 1 tablet, 1 tablet, Oral, q morning, Pool, Mallie Mussel, MD, 1 tablet at 11/15/22 0841   ondansetron (ZOFRAN) tablet 4 mg, 4 mg, Oral, Q6H PRN **OR** ondansetron (ZOFRAN) injection 4 mg, 4 mg, Intravenous, Q6H PRN, Pool, Mallie Mussel, MD   pantoprazole (PROTONIX) EC tablet 40 mg, 40 mg, Oral, Daily, Pool, Mallie Mussel, MD, 40 mg at 11/15/22 0841   sodium chloride flush (NS) 0.9 % injection 3 mL, 3 mL, Intravenous, Q12H, Pool, Mallie Mussel, MD, 3 mL at 11/15/22 0843   sodium chloride flush (NS) 0.9 % injection 3 mL, 3 mL, Intravenous, PRN, Earnie Larsson, MD   terbinafine (LAMISIL) tablet 250 mg, 250 mg, Oral, Daily, Earnie Larsson, MD, 250 mg at 11/15/22 0841   Patients Current Diet:  Diet Order                  Diet regular Room service appropriate? Yes; Fluid consistency: Thin  Diet effective now                         Precautions / Restrictions Precautions Precautions: Fall, Cervical Precaution Booklet Issued: Yes (comment) Precaution Comments: reviewed cervical precautions Cervical Brace: Soft collar Restrictions Weight Bearing Restrictions: No    Has the patient had 2 or more falls or a fall with injury in the past year?No   Prior Activity Level Community (5-7x/wk): Pt. was working and active in the community PTA   Prior Functional Level Prior Function Prior Level of Function : Independent/Modified Independent Mobility Comments: independent ADLs Comments: independent   Self Care: Did the patient need help bathing, dressing, using the toilet or eating?  Independent   Indoor Mobility: Did the patient need assistance with walking from room to room (with or without device)? Independent   Stairs: Did the patient need assistance with internal or external stairs (with or without device)? Independent   Functional Cognition: Did the patient need help planning regular tasks such as shopping or remembering to take medications? Independent   Patient Information Are you of Hispanic, Latino/a,or Spanish origin?: A. No, not of Hispanic, Latino/a, or Spanish origin What is your race?: A. White Do you need or want an interpreter to communicate with a doctor or health care staff?: 0. No   Patient's Response To:  Health Literacy and Transportation Is the patient able to respond to health literacy and transportation needs?: Yes Health Literacy - How often do you need to have someone help you when you read instructions, pamphlets, or other written material from your doctor or pharmacy?: Never In the past 12 months, has lack  of transportation kept you from medical appointments or from getting medications?: No In the past 12 months, has lack of transportation kept you from meetings, work, or from getting things needed for daily living?: No   Development worker, international aid / [place] Devices/Equipment: None Home Equipment: Conservation officer, nature (2 wheels)   Prior Device Use: Indicate devices/aids used by the patient prior to current illness, exacerbation or injury? None of the above   Current Functional Level Cognition   Overall Cognitive Status: Within Functional Limits for tasks assessed Orientation Level: Oriented X4    Extremity Assessment (includes Sensation/Coordination)   Upper Extremity Assessment: LUE deficits/detail LUE Deficits / Details: WFL AROM (within precautions) shoulder, elbow, forearm with MMT grossly 3/5.  Distally weaker with 3-/5 wrist extension and 0/5 hand flexion/extension. Decreased sensation.  Able to keep UE on RW during transfers but no grasp. LUE Sensation: decreased light touch LUE Coordination: decreased fine motor, decreased gross motor  Lower Extremity Assessment: Defer to PT evaluation LLE Deficits / Details: hip flex 0/5, knee ext 2/5, knee flex 3/5ankle df 3/5, pf 4/5 LLE Sensation: decreased light touch, decreased proprioception LLE Coordination: decreased gross motor, decreased fine motor     ADLs   Overall ADL's : Needs assistance/impaired Grooming: Set up, Sitting Upper Body Dressing : Minimal assistance, Sitting Lower Body Dressing: Maximal assistance, +2 for physical assistance, +2 for safety/equipment, Sit to/from stand Toilet Transfer: Maximal assistance Toilet Transfer Details (indicate cue type and reason): simulated to recliner Functional mobility during ADLs: Minimal assistance, Moderate assistance, +2 for safety/equipment, +2 for physical assistance, Cueing for sequencing General ADL Comments: focused on LUE HEP  Mobility   Overal bed mobility: Needs  Assistance Bed Mobility: Sit to Sidelying Rolling: Min assist Sidelying to sit: Min assist, HOB elevated Sit to supine: Min assist Sit to sidelying: Min assist, HOB elevated General bed mobility comments: MinA to manage legs with transition sit > sidelying to supine, cuing pt to lean laterally onto elbow to control trunk descent     Transfers   Overall transfer level: Needs assistance Equipment used: Rolling walker (2 wheels) Transfers: Sit to/from Stand Sit to Stand: Min assist Bed to/from chair/wheelchair/BSC transfer type:: Stand pivot Stand pivot transfers: Max assist General transfer comment: Pt cued to place L hand on RW and R on recliner armrest to push up to stand, minA for stability, x2 reps     Ambulation / Gait / Stairs / Wheelchair Mobility   Ambulation/Gait Ambulation/Gait assistance: Mod assist, Min assist Gait Distance (Feet): 48 Feet (x2 bouts of ~48 ft > ~26 ft) Assistive device: Rolling walker (2 wheels) Gait Pattern/deviations: Step-through pattern, Decreased step length - left, Decreased dorsiflexion - left, Decreased stride length, Decreased stance time - left, Decreased weight shift to left, Knees buckling, Trunk flexed, Narrow base of support General Gait Details: Pt demonstrating improved UE utilization to maintain stability when ambulating, even when his L knee would begin to buckle. MinA needed for stability, but up to modA needed for providing tactile and verbal cues to extend his L knee during stance and advance his L during step phase and verbal cues to look superiorly, improve his upright posture, and perform L heel strike initial contact. Mod success in carrying over these cues with faded feedback provided. Chair follow for safety Gait velocity: decreased Gait velocity interpretation: <1.31 ft/sec, indicative of household ambulator     Posture / Balance Dynamic Sitting Balance Sitting balance - Comments: statically supervision Balance Overall balance  assessment: Needs assistance Sitting-balance support: No upper extremity supported, Feet supported Sitting balance-Leahy Scale: Fair Sitting balance - Comments: statically supervision Postural control: Right lateral lean Standing balance support: Bilateral upper extremity supported, During functional activity Standing balance-Leahy Scale: Poor Standing balance comment: relies on BUE and external support     Special needs/care consideration Special service needs spinal precautions, soft collar brace        Previous Home Environment (from acute therapy documentation) Living Arrangements: Spouse/significant other Available Help at Discharge: Family, Available PRN/intermittently Type of Home: House Home Layout: Multi-level, Bed/bath upstairs Alternate Level Stairs-Rails: Right Alternate Level Stairs-Number of Steps: 15 Home Access: Stairs to enter Entrance Stairs-Rails: None Entrance Stairs-Number of Steps: 4 Bathroom Shower/Tub: Multimedia programmer: Standard Home Care Services: No 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 [employer]   Discharge Living Setting Plans for Discharge Living Setting: Patient's home Type of Home at Discharge: House Discharge Home Layout: Able to live on main level with bedroom/bathroom, Multi-level Alternate Level Stairs-Rails: Right Alternate Level Stairs-Number of Steps: 15 Discharge Home Access: Stairs to enter Entrance Stairs-Rails: None Entrance Stairs-Number of Steps: 4 Discharge Bathroom Shower/Tub: Walk-in shower Discharge Bathroom Toilet: Standard Discharge Bathroom Accessibility: Yes How Accessible: Accessible via walker Does the patient have any problems obtaining your medications?: No   Social/Family/Support Systems Patient Roles: Spouse Contact Information: [phone] Anticipated Caregiver: Akheem Schmitzer (wife) is Therapist, sports and can provide up to min-mod A Ability/Limitations of Caregiver: min-mod A Caregiver  Availability: 24/7 Discharge Plan Discussed with Primary Caregiver: Yes Is Caregiver In Agreement with Plan?: Yes Does Caregiver/Family have Issues with Lodging/Transportation while Pt is in Rehab?:  No     Goals Patient/Family Goal for Rehab: PT/OT Mod I goals Expected length of stay: 10-14 days Pt/Family Agrees to Admission and willing to participate: Yes Program Orientation Provided & Reviewed with Pt/Caregiver Including Roles  & Responsibilities: No     Decrease burden of Care through IP rehab admission: Othern/a      Possible need for SNF placement upon discharge:none      Patient Condition: This patient's condition remains as documented in the consult dated 11/14/22, in which the Rehabilitation Physician determined and documented that the patient's condition is appropriate for intensive rehabilitative care in an inpatient rehabilitation facility. Will admit to inpatient rehab today.   Preadmission Screen Completed By:  Retta Diones, RN, 11/15/2022 2:09 PM ______________________________________________________________________   Discussed status with Dr. Tressa Busman 2/29/23at 15:30 and received approval for admission today.   Admission Coordinator:  Retta Diones, time1409/Date2/29/24            Revision History

## 2022-11-15 NOTE — Discharge Summary (Signed)
Physician Discharge Summary  Patient ID: Johnny Robinson MRN: [id number] DOB/AGE: October 16, 1965 57 y.o.  Admit date: 11/13/2022 Discharge date: 11/15/2022  Admission Diagnoses:  Discharge Diagnoses:  Principal Problem:   Cervical myelopathy [facility])   Discharged Condition: good  Hospital Course: Patient was admitted to the hospital after undergoing 3 level anterior cervical decompression and fusion surgery at an outside institution.  Patient awakened with significant worsening of his myelopathy with profound left-sided weakness.  The patient has weakness has gradually improved with time and therapy and his left upper extremity now has good strength except he has ongoing severe left hand weakness with some is a sensory loss.  Patient also with left lower extremity weakness which is also improved but still does not have full strength.  Postoperative MRI scanning demonstrates evidence of significant cord edema at the C5-6 level consistent with contusion.  No evidence of any ongoing compressive pathology.  The patient is progressing with physical therapy and would benefit from more extensive therapy with inpatient rehabilitation.  Plan is for transfer to inpatient rehab.  Consults:   Significant Diagnostic Studies:   Treatments:   Discharge Exam: Blood pressure 105/67, pulse 86, temperature 97.7 F (36.5 C), temperature source Oral, resp. rate 18, height '6\' 1"'$  (1.854 m), weight 93 kg, SpO2 93 %.   Patient is awake and alert.  He is oriented and appropriate.  Speech is normal.  Cranial nerve function intact.  Motor examination reveals intact but deltoid strength bilaterally.  He has intact biceps strength bilaterally.  He has 4+ over 5 wrist extensor and brachial radialis strength on the left normal on the right.  He has 4/5 left triceps of weakness normal strength on the right.  Grip and intrinsic fraction is normal on the right and trace on the left.  Right lower extremity motor examination is normal with  normal sensory exam.  Left lower extremity with  4/5 strength in his left quadriceps and anterior tibialis and left plantar flexion.  Patient with 2/5 left hip flexion.  Wound is healing well.  Chest and abdomen are benign.  Disposition: Discharge disposition: 02-Transferred to [facility]        Allergies as of 11/15/2022   No Known Allergies      Medication List     ASK your doctor about these medications    aspirin EC 81 MG tablet Take 81 mg by mouth daily.   atorvastatin 20 MG tablet Commonly known as: LIPITOR Take 1 tablet (20 mg total) by mouth once daily Ask about: Which instructions should I use?   cyclobenzaprine 10 MG tablet Commonly known as: FLEXERIL Take 1 tablet (10 mg total) by mouth 3 (three) times daily as needed for muscle spasms   HYDROcodone-acetaminophen 5-325 MG tablet Commonly known as: NORCO/VICODIN Take 1 tablet by mouth every 4 (four) hours as needed for pain Ask about: Which instructions should I use?   ibuprofen 800 MG tablet Commonly known as: ADVIL Take 1 tablet (800 mg total) by mouth every 6 (six) hours as needed.   losartan 100 MG tablet Commonly known as: COZAAR Take 1 tablet (100 mg total) by mouth once daily Ask about: Which instructions should I use?   methylPREDNISolone 4 MG Tbpk tablet Commonly known as: MEDROL DOSEPAK Take as directed per package directions   metoprolol succinate 25 MG 24 hr tablet Commonly known as: TOPROL-XL Take 1 tablet (25 mg total) by mouth once daily Ask about: Which instructions should I use?   omeprazole  40 MG capsule Commonly known as: PRILOSEC TAKE ONE CAPSULE BY MOUTH EVERY MORNING Ask about: Which instructions should I use?   predniSONE 20 MG tablet Commonly known as: DELTASONE Take 1 tablet (20 mg total) by mouth daily.   terbinafine 250 MG tablet Commonly known as: LAMISIL Take 1 tablet (250 mg total) by mouth daily. Ask about: Which instructions should I use?          Signed: Cooper Render Markon Jares 11/15/2022, 2:17 PM

## 2022-11-15 NOTE — Progress Notes (Incomplete)
Inpatient Rehabilitation Admission Medication Review by a Pharmacist  A complete drug regimen review was completed for this patient to identify any potential clinically significant medication issues.  High Risk Drug Classes Is patient taking? Indication by Medication  Antipsychotic Yes Compazine- N/V  Anticoagulant Yes Lovenox- vte ppx  Antibiotic Yes Lamisil- onychomycosis  Opioid Yes Norco- acute pain  Antiplatelet No   Hypoglycemics/insulin No   Vasoactive Medication Yes Toprol- HTN  Chemotherapy No   Other Yes Protonix- GERD Lipitor- HLD     Type of Medication Issue Identified Description of Issue Recommendation(s)  Drug Interaction(s) (clinically significant)     Duplicate Therapy     Allergy     No Medication Administration End Date     Incorrect Dose     Additional Drug Therapy Needed     Significant med changes from prior encounter (inform family/care partners about these prior to discharge).    Other        Clinically significant medication issues were identified that warrant physician communication and completion of prescribed/recommended actions by midnight of the next day:  No   Time spent performing this drug regimen review (minutes):  30   Ura Yingling BS, PharmD, BCPS Clinical Pharmacist 11/15/2022 12:43 PM  Contact: [phone] after 3 PM  "Be curious, not judgmental..." -Jamal Maes

## 2022-11-15 NOTE — H&P (Signed)
Physical Medicine and Rehabilitation Admission H&P     CC: Functional decline due to cervical myelopathy.    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 Dr. Annette Stable expressed concerns of cord contusion due to worsening of myelopathy and MRI spine done which showed post op changes form ACDF C3-C6 and patchy signal abnormality involving the central and left cord at C5/6 concerning for contusion/injury. PT/OT evaluations completed revealing left sided weakness with sensory deficits affecting ADLs, needs +2 mod assist for mobility due to tendency for Knees to buckle with flexed trunk and post op pain. CIR recommended due to functional decline.    Pt reports L sided weakness- he notes he had his surgery for sensory changes and pain.  L hand is numb but burning pain is gone since before surgery.  LBM 3 days ago- pre surgery.  Voiding OK.      Review of Systems  Constitutional:  Negative for chills and fever.  HENT:  Negative for hearing loss and tinnitus.   Eyes:  Negative for blurred vision and double vision.  Respiratory:  Negative for cough and hemoptysis.   Cardiovascular:  Negative for chest pain.  Gastrointestinal:  Positive for constipation. Negative for abdominal pain, heartburn and nausea.  Genitourinary:  Negative for dysuria and urgency.  Musculoskeletal:  Negative for back pain, joint pain and myalgias.  Skin:  Negative for rash.  Neurological:  Positive for sensory change, focal weakness and weakness.  Psychiatric/Behavioral:  The patient has insomnia (due to neuropathy).   All other systems reviewed and are negative.          Past Medical History:  Diagnosis Date   Arthritis     Chronic kidney disease 2016    RENAL INFARCT   GERD (gastroesophageal reflux disease)     Hypertension     Reflux     Subdural hematoma ([place]) 1991           Past Surgical History:  Procedure Laterality Date   BACK SURGERY   11/2017   COLONOSCOPY WITH PROPOFOL N/A 09/02/2018    Procedure: COLONOSCOPY WITH PROPOFOL;  Surgeon: Lucilla Lame, MD;  Location: [facility] END[facility];  Service: Endoscopy;  Laterality: N/A;   EXCISION MASS ABDOMINAL Left 05/28/2018    Procedure: EXCISIONAL BIOPSY MASS ABDOMINAL WALL;  Surgeon: Herbert Pun, MD;  Location: [facility] ORS;  Service: General;  Laterality: Left;   PERIPHERAL VASCULAR CATHETERIZATION Right 05/18/2015    Procedure: Renal Angiography;  Surgeon: Algernon Huxley, MD;  Location: [place] [facility];  Service: Cardiovascular;  Laterality: Right;   PERIPHERAL VASCULAR CATHETERIZATION Bilateral 05/18/2015    Procedure: Renal Intervention;  Surgeon: Algernon Huxley, MD;  Location: [place] [facility];  Service: Cardiovascular;  Laterality: Bilateral;   TOTAL HIP ARTHROPLASTY Left 03/07/2022    Procedure: TOTAL HIP ARTHROPLASTY ANTERIOR APPROACH;  Surgeon: Gaynelle Arabian, MD;  Location: [facility];  Service: Orthopedics;  Laterality: Left;           Family History  Problem Relation Age of Onset   Nephrolithiasis Mother     Cancer Father        Social History:  Married.  He was working fulltime as a Chief Executive Officer. He  reports that he has never smoked. He has never used smokeless tobacco. He reports current alcohol use. He reports that he does not use drugs.     Allergies: No Known Allergies           Medications Prior to Admission  Medication Sig Dispense Refill   atorvastatin (LIPITOR) 20 MG tablet Take 1 tablet (20 mg total) by mouth once daily 90 tablet 3   metoprolol succinate (TOPROL-XL) 25 MG 24 hr tablet Take 1 tablet (25 mg total) by mouth once daily 90 tablet 3   omeprazole (PRILOSEC) 40 MG  capsule TAKE ONE CAPSULE BY MOUTH EVERY MORNING (Patient taking differently: Take 40 mg by mouth daily.) 90 capsule 3   terbinafine (LAMISIL) 250 MG tablet Take 1 tablet (250 mg total) by mouth daily. 30 tablet 3   aspirin EC 81 MG tablet Take 81 mg by mouth daily. (Patient not taking: Reported on 11/14/2022)       cyclobenzaprine (FLEXERIL) 10 MG tablet Take 1 tablet (10 mg total) by mouth 3 (three) times daily as needed for muscle spasms (Patient not taking: Reported on 11/14/2022) 30 tablet 0   HYDROcodone-acetaminophen (NORCO/VICODIN) 5-325 MG tablet Take 1 tablet by mouth every 4 (four) hours as needed for pain (Patient not taking: Reported on 11/14/2022) 30 tablet 0   ibuprofen (ADVIL) 800 MG tablet Take 1 tablet (800 mg total) by mouth every 6 (six) hours as needed. (Patient not taking: Reported on 11/14/2022) 60 tablet 1   losartan (COZAAR) 100 MG tablet Take 1 tablet (100 mg total) by mouth once daily (Patient not taking: Reported on 11/14/2022) 90 tablet 3   methylPREDNISolone (MEDROL DOSEPAK) 4 MG TBPK tablet Take as directed per package directions (Patient not taking: Reported on 11/14/2022) 21 tablet 0   predniSONE (DELTASONE) 20 MG tablet Take 1 tablet (20 mg total) by mouth daily. (Patient not taking: Reported on 11/14/2022) 7 tablet 0          Home: Home Living Family/patient expects to be discharged to:: Private residence Living Arrangements: Spouse/significant other Available Help at Discharge: Family, Available PRN/intermittently Type of Home: House Home Access: Stairs to enter [employer] of Steps: 4 Entrance Stairs-Rails: None Home Layout: Multi-level, Bed/bath upstairs Alternate Level Stairs-Number of Steps: 15 Alternate Level Stairs-Rails: Right Bathroom Shower/Tub: Multimedia programmer: Standard Home Equipment: Conservation officer, nature (2 wheels) 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 [employer]   Functional History: Prior Function Prior  Level of Function : Independent/Modified Independent Mobility Comments: independent ADLs Comments: independent   Functional Status:  Mobility: Bed Mobility Overal bed mobility: Needs Assistance Bed Mobility: Rolling, Sidelying to Sit Rolling: Min assist Sidelying to sit: Min assist, HOB elevated Sit to supine: Min assist General bed mobility comments: cues for technique and assistance with trunk Transfers Overall transfer level: Needs assistance Equipment used: None Transfers: Sit to/from Stand, Bed to chair/wheelchair/BSC Sit to Stand: Mod assist Bed to/from chair/wheelchair/BSC transfer type:: Stand pivot Stand pivot transfers: Max assist General transfer comment: Patient asked to perform transfer without RW due to unable to use LUE. Performed SPT for safety and blocking LLE with face to face technique Ambulation/Gait Ambulation/Gait assistance: Mod assist, +2 physical assistance, +2 safety/equipment Gait Distance (Feet): 18 Feet Assistive device: Rolling walker (2 wheels) Gait Pattern/deviations: Step-through pattern, Decreased step length - left, Decreased dorsiflexion - left, Decreased stride length, Decreased stance time - left, Decreased weight shift to  left, Knees buckling, Trunk flexed General Gait Details: Pt needing tactile cues and assistance to advance his L foot to step and to extend his L knee during stance due to noted knee buckling. Faded cues as distance progressed with noted good carryover. Needs reminders to push up through RW and extend his trunk to stand upright. Up to modAx2 for L leg management and stability Gait velocity: decreased Gait velocity interpretation: <1.31 ft/sec, indicative of household ambulator   ADL: ADL Overall ADL's : Needs assistance/impaired Grooming: Set up, Sitting Upper Body Dressing : Minimal assistance, Sitting Lower Body Dressing: Maximal assistance, +2 for physical assistance, +2 for safety/equipment, Sit to/from stand Toilet  Transfer: Maximal assistance Toilet Transfer Details (indicate cue type and reason): simulated to recliner Functional mobility during ADLs: Minimal assistance, Moderate assistance, +2 for safety/equipment, +2 for physical assistance, Cueing for sequencing General ADL Comments: focused on LUE HEP   Cognition: Cognition Overall Cognitive Status: Within Functional Limits for tasks assessed Orientation Level: Oriented X4 Cognition Arousal/Alertness: Awake/alert Behavior During Therapy: WFL for tasks assessed/performed Overall Cognitive Status: Within Functional Limits for tasks assessed   Physical Exam: Blood pressure (!) 97/57, pulse 92, temperature 98.1 F (36.7 C), temperature source Oral, resp. rate 18, height '6\' 1"'$  (1.854 m), weight 93 kg, SpO2 99 %. Physical Exam Vitals and nursing note reviewed. Exam conducted with a chaperone present.  Constitutional:      General: He is not in acute distress.    Appearance: Normal appearance. He is normal weight.     Comments: Pt sitting up in bed; awake, alert, wife at bedside, NAD  HENT:     Head: Normocephalic and atraumatic.     Comments: Face symmetrical    Right Ear: External ear normal.     Left Ear: External ear normal.     Nose: Nose normal. No congestion.     Mouth/Throat:     Mouth: Mucous membranes are dry.     Pharynx: Oropharynx is clear. No oropharyngeal exudate.  Eyes:     General:        Right eye: No discharge.        Left eye: No discharge.  Neck:     Comments: Anterior neck incision C/D/I- looks great- no drainage or erythema Cardiovascular:     Rate and Rhythm: Normal rate and regular rhythm.     Heart sounds: Normal heart sounds. No murmur heard.    No gallop.  Pulmonary:     Effort: Pulmonary effort is normal. No respiratory distress.     Breath sounds: Normal breath sounds. No wheezing, rhonchi or rales.  Abdominal:     General: There is no distension.     Palpations: Abdomen is soft.     Tenderness: There  is no abdominal tenderness.     Comments: Soft, NT, ND, normoactive BS  Musculoskeletal:     Cervical back: Neck supple.     Comments: RUE 5/5 in biceps, triceps, WE< grip and FA LUE- biceps 5-/5; triceps 4/5; WE 4/5; grip and FA 0/5 RLE- 5/5 in HF, KE, KF DF and PF and EHL LLE- HF 2-/5; KE 4-/5; KF 2/5; DF 4-/5, PF 4/5; EHL 4-/5  Skin:    General: Skin is warm and dry.     Comments: R handed IV Looks OK  Neurological:     Mental Status: He is alert and oriented to person, place, and time.     Comments: Sensation intact to light touch in all 4 extremities except  L C8 distribution is reduced DTR's 2+ In biceps, brachioradialis and patella B/L   Psychiatric:        Mood and Affect: Mood normal.        Behavior: Behavior normal.        Lab Results Last 48 Hours        Results for orders placed or performed during the hospital encounter of 11/13/22 (from the past 48 hour(s))  Glucose, capillary     Status: Abnormal    Collection Time: 11/13/22  3:48 PM  Result Value Ref Range    Glucose-Capillary 150 (H) 70 - 99 mg/dL      Comment: Glucose reference range applies only to samples taken after fasting for at least 8 hours.    Comment 1 Notify RN      Comment 2 Document in Chart         Imaging Results (Last 48 hours)  MR CERVICAL SPINE W WO CONTRAST   Result Date: 11/14/2022 CLINICAL DATA:  Initial evaluation for left-sided weakness status post cervical ACDF. EXAM: MRI CERVICAL SPINE WITHOUT AND WITH CONTRAST TECHNIQUE: Multiplanar and multiecho pulse sequences of the cervical spine, to include the craniocervical junction and cervicothoracic junction, were obtained without and with intravenous contrast. CONTRAST:  36m GADAVIST GADOBUTROL 1 MMOL/ML IV SOLN COMPARISON:  Comparison made with prior MRI from 10/24/2022. FINDINGS: Alignment: Straightening of the normal cervical lordosis. No listhesis. Vertebrae: Postoperative changes from prior ACDF at C3 through C6. Hardware appears  appropriately positioned. Vertebral body height maintained with no visible acute or chronic fracture. Bone marrow signal intensity within normal limits. No worrisome osseous lesions. No abnormal marrow edema or enhancement. Cord: Patchy signal abnormality involving the central and left cord at the level of C5-6, concerning for contusion/injury (series 9, images 25-28). Cord is somewhat swollen at this level (series 5, image 9). No abnormal enhancement. Remainder of the visualized cord otherwise demonstrates a normal signal and morphology. No epidural hematoma or other collection. Posterior Fossa, vertebral arteries, paraspinal tissues: Visualized brain and posterior fossa within normal limits. Craniocervical junction normal. Diffuse prevertebral edema, felt to be within normal limits for normal expected postoperative changes. No visible loculated collections. Normal flow voids seen within the vertebral arteries bilaterally. Disc levels: C2-C3: Normal interspace. Mild left-sided facet hypertrophy. No canal or foraminal stenosis. C3-C4: Prior ACDF. No significant residual spinal stenosis. Foramina remain patent. C4-C5: Prior ACDF. No significant residual spinal stenosis. Foramina remain patent. C5-C6: Prior ACDF. Residual mild flattening of the ventral thecal sac, asymmetric to the left, but no significant spinal stenosis. Foramina remain patent. C6-C7: Mild diffuse disc bulge with endplate and uncovertebral spurring. Flattening of the ventral thecal sac without significant spinal stenosis. Moderate bilateral C7 foraminal stenosis, slightly worse on the left. C7-T1: Normal interspace. Left greater than right facet hypertrophy. No spinal stenosis. Foramina remain patent. IMPRESSION: 1. Postoperative changes from prior ACDF at C3 through C6 without significant residual spinal stenosis. 2. Patchy signal abnormality involving the central and left cord at the level of C5-6, concerning for contusion/injury. 3. Disc bulge  with uncovertebral spurring at C6-7 with resultant moderate bilateral C7 foraminal stenosis. Electronically Signed   By: BJeannine BogaM.D.   On: 11/14/2022 00:54           Blood pressure (!) 97/57, pulse 92, temperature 98.1 F (36.7 C), temperature source Oral, resp. rate 18, height '6\' 1"'$  (1.854 m), weight 93 kg, SpO2 99 %.   Medical Problem List and Plan: 1. Functional deficits  secondary to Cervical myelopathy with L sided weakness s/p C3-6 ACDF             -patient may  shower- cover incision- will see if therapy can use Estim once assessed. No cancer hx.              -ELOS/Goals: 7-10 days- mod I to supervision 2.  Antithrombotics: -DVT/anticoagulation:  Mechanical: Sequential compression devices, below knee Bilateral lower extremities             Will see when can start Lovenox             -antiplatelet therapy: N/A 3. Pain Management: Hydrocodone and flexeril prn for pain  4. Mood/Behavior/Sleep: LCSW to follow for evaluation and support.              --Fexeril prn for muscle pain.              --gabapentin added BID to help LUE/LLE achy pain.               -antipsychotic agents: N/A 5. Neuropsych/cognition: This patient is capable of making decisions on his own behalf. 6. Skin/Wound Care: Monitor wound for healing.  7. Fluids/Electrolytes/Nutrition: Monitor I/O. Check CMET in am.  8. Renal infarct//hx of CKD/HTN: Monitor BP TID--on metoprolol to keep SBP 90-100 range per wife             --was not taking Cozaar PTA.  9. GERD: Continue PPI. 10. Labs: Wife reports no labs done pre-op.   --Recheck CMET/CBC in am.  11. Constipation- LBM 3 days ago- not on anything scheduled- will schedule bowel meds- if no results by tomorrow, will do Sorbitol.        Bary Leriche, PA-C 11/15/2022   I have personally performed a face to face diagnostic evaluation of this patient and formulated the key components of the plan.  Additionally, I have personally reviewed laboratory data,  imaging studies, as well as relevant notes and concur with the physician assistant's documentation above.   The patient's status has not changed from the original H&P.  Any changes in documentation from the acute care chart have been noted above.

## 2022-11-15 NOTE — Progress Notes (Signed)
Patient arrived from 3W around 1700, assigned to 4M12.

## 2022-11-15 NOTE — Plan of Care (Signed)

## 2022-11-15 NOTE — Progress Notes (Signed)
Postop day 3.  No new events or problems overnight.  Minimal neck pain.  No radicular pain of note.  Patient still with profound weakness of his left hand but otherwise strength in his left upper extremity reasonably normal.  Patient continues to regain function in his left lower extremity.  He has been able to stand and bear weight and walk a little bit but still is quite limited.  Still has significant hip flexion weakness on the left side.  Wound is healing well.  Neck soft.  Overall progressing slowly but trending in a positive direction.  Waiting to hear from rehabilitation with regard to possible transfer.  Patient ready from a medical standpoint.

## 2022-11-15 NOTE — Progress Notes (Signed)
Orthopedic Tech Progress Note Patient Details:  Johnny Robinson 30-Jan-1966 [id number]  Called in order to [employer] for a RESTING HAND SPLINT   Patient ID: Johnny Robinson, male   DOB: 1966-03-13, 57 y.o.   MRN: [id number]  [name] 11/15/2022, 11:50 PM

## 2022-11-15 NOTE — Progress Notes (Signed)
Occupational Therapy Treatment Patient Details Name: Johnny Robinson MRN: [id number] DOB: 30-Mar-1966 Today's Date: 11/15/2022   History of present illness Pt is 57 yo male who presents with L sided weakness after C3-6 ACDF for cervical myelopathy. MRI with patchy signal abnormality involving the central and L cord and C5-6 concerning for contusion/injury. PMH: L THA 6/23, OA, GERD, CKD, HTN, lumbar surgery x3, SDH in 1990's.   OT comments  Patient continues to make gains with OT treatment with min assist to get to EOB from sidelying with assistance for trunk. Patient asked to attempt transfer without RW due to lack of grip on LUE. Patient was max assist for face to face transfer to recliner.Patient demonstrating good AROM for shoulder, elbow, and wrist.  PROM, AROM, and SROM exercises performed for LUE finger flexion/extension. Patient instructed in exercises to perform on his own to increase return. Patient continues to be good candidate for AIR for continued OT to increase safety and independence with functional transfers, self care, and increase functional use of LUE. Acute OT to continue to follow.    Recommendations for follow up therapy are one component of a multi-disciplinary discharge planning process, led by the attending physician.  Recommendations may be updated based on patient status, additional functional criteria and insurance authorization.    Follow Up Recommendations  Acute inpatient rehab (3hours/day)     Assistance Recommended at Discharge Intermittent Supervision/Assistance  Patient can return home with the following  Two people to help with walking and/or transfers;A lot of help with bathing/dressing/bathroom;Assistance with cooking/housework;Assist for transportation;Help with stairs or ramp for entrance   Equipment Recommendations  Other (comment) (defer)    Recommendations for Other Services      Precautions / Restrictions Precautions Precautions:  Fall;Cervical Precaution Booklet Issued: Yes (comment) Precaution Comments: reviewed cervical precautions Required Braces or Orthoses: Cervical Brace Cervical Brace: Soft collar Restrictions Weight Bearing Restrictions: No       Mobility Bed Mobility Overal bed mobility: Needs Assistance Bed Mobility: Rolling, Sidelying to Sit Rolling: Min assist Sidelying to sit: Min assist, HOB elevated       General bed mobility comments: cues for technique and assistance with trunk    Transfers Overall transfer level: Needs assistance Equipment used: None Transfers: Sit to/from Stand, Bed to chair/wheelchair/BSC Sit to Stand: Mod assist Stand pivot transfers: Max assist         General transfer comment: Patient asked to perform transfer without RW due to unable to use LUE. Performed SPT for safety and blocking LLE with face to face technique     Balance Overall balance assessment: Needs assistance Sitting-balance support: No upper extremity supported, Feet supported Sitting balance-Leahy Scale: Fair Sitting balance - Comments: supervision Postural control: Right lateral lean Standing balance support: Bilateral upper extremity supported, During functional activity Standing balance-Leahy Scale: Poor Standing balance comment: stood for transfer                           ADL either performed or assessed with clinical judgement   ADL Overall ADL's : Needs assistance/impaired                         Toilet Transfer: Maximal assistance Toilet Transfer Details (indicate cue type and reason): simulated to recliner           General ADL Comments: focused on LUE HEP    Extremity/Trunk Assessment Upper Extremity Assessment LUE Deficits / Details:  WFL AROM (within precautions) shoulder, elbow, forearm with MMT grossly 3/5.  Distally weaker with 3-/5 wrist extension and 0/5 hand flexion/extension. Decreased sensation.  Able to keep UE on RW during transfers but  no grasp. LUE Sensation: decreased light touch LUE Coordination: decreased fine motor;decreased gross motor            Vision       Perception     Praxis      Cognition Arousal/Alertness: Awake/alert Behavior During Therapy: WFL for tasks assessed/performed Overall Cognitive Status: Within Functional Limits for tasks assessed                                          Exercises Exercises: General Upper Extremity General Exercises - Upper Extremity Shoulder Flexion: AROM, Left, 10 reps Shoulder ABduction: AROM, Left, 10 reps, Seated Elbow Flexion: AROM, Left, 10 reps, Seated Wrist Flexion: AROM, Left, 10 reps, Seated Digit Composite Flexion: PROM, AAROM, Left, 10 reps, Seated    Shoulder Instructions       General Comments      Pertinent Vitals/ Pain       Pain Assessment Pain Assessment: Faces Faces Pain Scale: Hurts a little bit Pain Location: neck, incisional Pain Descriptors / Indicators: Operative site guarding, Discomfort Pain Intervention(s): Limited activity within patient's tolerance, Monitored during session, Repositioned  Home Living                                          Prior Functioning/Environment              Frequency  Min 3X/week        Progress Toward Goals  OT Goals(current goals can now be found in the care plan section)  Progress towards OT goals: Progressing toward goals  Acute Rehab OT Goals Patient Stated Goal: increase independence OT Goal Formulation: With patient Time For Goal Achievement: 11/28/22 Potential to Achieve Goals: Good ADL Goals Pt Will Perform Grooming: with supervision;standing Pt Will Perform Lower Body Dressing: with supervision;sit to/from stand;sitting/lateral leans;with adaptive equipment Pt Will Transfer to Toilet: with supervision;ambulating;bedside commode;stand pivot transfer Pt/caregiver will Perform Home Exercise Program: Increased strength;Left upper  extremity;Increased ROM  Plan Discharge plan remains appropriate    Co-evaluation                 AM-PAC OT "6 Clicks" Daily Activity     Outcome Measure   Help from another person eating meals?: A Little Help from another person taking care of personal grooming?: A Little Help from another person toileting, which includes using toliet, bedpan, or urinal?: A Lot Help from another person bathing (including washing, rinsing, drying)?: A Lot Help from another person to put on and taking off regular upper body clothing?: A Little Help from another person to put on and taking off regular lower body clothing?: A Lot 6 Click Score: 15    End of Session Equipment Utilized During Treatment: Gait belt;Cervical collar  OT Visit Diagnosis: Other abnormalities of gait and mobility (R26.89);Muscle weakness (generalized) (M62.81);Other symptoms and signs involving the nervous system (R29.898)   Activity Tolerance Patient tolerated treatment well   Patient Left in chair;with call bell/phone within reach;with family/visitor present   Nurse Communication Mobility status        Time: [id number] OT Time  Calculation (min): 23 min  Charges: OT General Charges $OT Visit: 1 Visit OT Treatments $Therapeutic Activity: 8-22 mins $Neuromuscular Re-education: 8-22 mins  Lodema Hong, [place]  Office [phone]   Trixie Dredge 11/15/2022, 10:52 AM

## 2022-11-15 NOTE — H&P (Signed)
Physical Medicine and Rehabilitation Admission H&P    CC: Functional decline due to cervical myelopathy.   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 Dr. Annette Stable expressed concerns of cord contusion due to worsening of myelopathy and MRI spine done which showed post op changes form ACDF C3-C6 and patchy signal abnormality involving the central and left cord at C5/6 concerning for contusion/injury. PT/OT evaluations completed revealing left sided weakness with sensory deficits affecting ADLs, needs +2 mod assist for mobility due to tendency for Knees to buckle with flexed trunk and post op pain. CIR recommended due to functional decline.   Pt reports L sided weakness- he notes he had his surgery for sensory changes and pain.  L hand is numb but burning pain is gone since before surgery.  LBM 3 days ago- pre surgery.  Voiding OK.    Review of Systems  Constitutional:  Negative for chills and fever.  HENT:  Negative for hearing loss and tinnitus.   Eyes:  Negative for blurred vision and double vision.  Respiratory:  Negative for cough and hemoptysis.   Cardiovascular:  Negative for chest pain.  Gastrointestinal:  Positive for constipation. Negative for abdominal pain, heartburn and nausea.  Genitourinary:  Negative for dysuria and urgency.  Musculoskeletal:  Negative for back pain, joint pain and myalgias.  Skin:  Negative for rash.  Neurological:  Positive for sensory change, focal weakness and weakness.  Psychiatric/Behavioral:  The patient has insomnia (due to neuropathy).   All other systems reviewed and are negative.   Past Medical  History:  Diagnosis Date   Arthritis    Chronic kidney disease 2016   RENAL INFARCT   GERD (gastroesophageal reflux disease)    Hypertension    Reflux    Subdural hematoma ([place]) 1991    Past Surgical History:  Procedure Laterality Date   BACK SURGERY  11/2017   COLONOSCOPY WITH PROPOFOL N/A 09/02/2018   Procedure: COLONOSCOPY WITH PROPOFOL;  Surgeon: Lucilla Lame, MD;  Location: [facility];  Service: Endoscopy;  Laterality: N/A;   EXCISION MASS ABDOMINAL Left 05/28/2018   Procedure: EXCISIONAL BIOPSY MASS ABDOMINAL WALL;  Surgeon: Herbert Pun, MD;  Location: [facility];  Service: General;  Laterality: Left;   PERIPHERAL VASCULAR CATHETERIZATION Right 05/18/2015   Procedure: Renal Angiography;  Surgeon: Algernon Huxley, MD;  Location: [facility];  Service: Cardiovascular;  Laterality: Right;   PERIPHERAL VASCULAR CATHETERIZATION Bilateral 05/18/2015   Procedure: Renal Intervention;  Surgeon: Algernon Huxley, MD;  Location: [place] [facility];  Service: Cardiovascular;  Laterality: Bilateral;   TOTAL HIP ARTHROPLASTY Left 03/07/2022   Procedure: TOTAL HIP ARTHROPLASTY ANTERIOR APPROACH;  Surgeon: Gaynelle Arabian, MD;  Location: [facility];  Service: Orthopedics;  Laterality: Left;    Family History  Problem Relation Age of Onset   Nephrolithiasis Mother    Cancer Father     Social History:  Married. He was working fulltime as a Chief Executive Officer. He  reports that he has never smoked. He has never used smokeless tobacco. He reports current alcohol use. He reports that he does not use drugs.   Allergies: No Known Allergies  Medications Prior to Admission  Medication Sig Dispense Refill   atorvastatin (LIPITOR) 20 MG tablet Take 1 tablet (20 mg total) by mouth once daily 90 tablet 3   metoprolol succinate (TOPROL-XL) 25 MG 24 hr tablet Take 1 tablet (25 mg total) by mouth once daily 90 tablet 3   omeprazole (PRILOSEC) 40 MG capsule TAKE ONE CAPSULE BY MOUTH EVERY MORNING (Patient  taking differently: Take 40 mg by mouth daily.) 90 capsule 3   terbinafine (LAMISIL) 250 MG tablet Take 1 tablet (250 mg total) by mouth daily. 30 tablet 3   aspirin EC 81 MG tablet Take 81 mg by mouth daily. (Patient not taking: Reported on 11/14/2022)     cyclobenzaprine (FLEXERIL) 10 MG tablet Take 1 tablet (10 mg total) by mouth 3 (three) times daily as needed for muscle spasms (Patient not taking: Reported on 11/14/2022) 30 tablet 0   HYDROcodone-acetaminophen (NORCO/VICODIN) 5-325 MG tablet Take 1 tablet by mouth every 4 (four) hours as needed for pain (Patient not taking: Reported on 11/14/2022) 30 tablet 0   ibuprofen (ADVIL) 800 MG tablet Take 1 tablet (800 mg total) by mouth every 6 (six) hours as needed. (Patient not taking: Reported on 11/14/2022) 60 tablet 1   losartan (COZAAR) 100 MG tablet Take 1 tablet (100 mg total) by mouth once daily (Patient not taking: Reported on 11/14/2022) 90 tablet 3   methylPREDNISolone (MEDROL DOSEPAK) 4 MG TBPK tablet Take as directed per package directions (Patient not taking: Reported on 11/14/2022) 21 tablet 0   predniSONE (DELTASONE) 20 MG tablet Take 1 tablet (20 mg total) by mouth daily. (Patient not taking: Reported on 11/14/2022) 7 tablet 0      Home: Home Living Family/patient expects to be discharged to:: Private residence Living Arrangements: Spouse/significant other Available Help at Discharge: Family, Available PRN/intermittently Type of Home: House Home Access: Stairs to enter [employer] of Steps: 4 Entrance Stairs-Rails: None Home Layout: Multi-level, Bed/bath upstairs Alternate Level Stairs-Number of Steps: 15 Alternate Level Stairs-Rails: Right Bathroom Shower/Tub: Multimedia programmer: Standard Home Equipment: Conservation officer, nature (2 wheels) 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 [employer]   Functional History: Prior Function Prior Level of Function : Independent/Modified Independent Mobility  Comments: independent ADLs Comments: independent  Functional Status:  Mobility: Bed Mobility Overal bed mobility: Needs Assistance Bed Mobility: Rolling, Sidelying to Sit Rolling: Min assist Sidelying to sit: Min assist, HOB elevated Sit to supine: Min assist General bed mobility comments: cues for technique and assistance with trunk Transfers Overall transfer level: Needs assistance Equipment used: None Transfers: Sit to/from Stand, Bed to chair/wheelchair/BSC Sit to Stand: Mod assist Bed to/from chair/wheelchair/BSC transfer type:: Stand pivot Stand pivot transfers: Max assist General transfer comment: Patient asked to perform transfer without RW due to unable to use LUE. Performed SPT for safety and blocking LLE with face to face technique Ambulation/Gait Ambulation/Gait assistance: Mod assist, +2 physical assistance, +2 safety/equipment Gait Distance (Feet): 18 Feet Assistive device: Rolling walker (2 wheels) Gait Pattern/deviations: Step-through pattern, Decreased step length - left, Decreased dorsiflexion - left, Decreased stride length, Decreased stance time - left, Decreased weight shift to left, Knees buckling, Trunk flexed General Gait Details: Pt needing tactile cues and assistance to advance his L foot to step and to extend his L knee during stance due to noted knee buckling. Faded cues as distance progressed with noted good carryover. Needs reminders to push up through RW and extend his trunk to stand upright. Up to  modAx2 for L leg management and stability Gait velocity: decreased Gait velocity interpretation: <1.31 ft/sec, indicative of household ambulator    ADL: ADL Overall ADL's : Needs assistance/impaired Grooming: Set up, Sitting Upper Body Dressing : Minimal assistance, Sitting Lower Body Dressing: Maximal assistance, +2 for physical assistance, +2 for safety/equipment, Sit to/from stand Toilet Transfer: Maximal assistance Toilet Transfer Details (indicate  cue type and reason): simulated to recliner Functional mobility during ADLs: Minimal assistance, Moderate assistance, +2 for safety/equipment, +2 for physical assistance, Cueing for sequencing General ADL Comments: focused on LUE HEP  Cognition: Cognition Overall Cognitive Status: Within Functional Limits for tasks assessed Orientation Level: Oriented X4 Cognition Arousal/Alertness: Awake/alert Behavior During Therapy: WFL for tasks assessed/performed Overall Cognitive Status: Within Functional Limits for tasks assessed  Physical Exam: Blood pressure (!) 97/57, pulse 92, temperature 98.1 F (36.7 C), temperature source Oral, resp. rate 18, height '6\' 1"'$  (1.854 m), weight 93 kg, SpO2 99 %. Physical Exam Vitals and nursing note reviewed. Exam conducted with a chaperone present.  Constitutional:      General: He is not in acute distress.    Appearance: Normal appearance. He is normal weight.     Comments: Pt sitting up in bed; awake, alert, wife at bedside, NAD  HENT:     Head: Normocephalic and atraumatic.     Comments: Face symmetrical    Right Ear: External ear normal.     Left Ear: External ear normal.     Nose: Nose normal. No congestion.     Mouth/Throat:     Mouth: Mucous membranes are dry.     Pharynx: Oropharynx is clear. No oropharyngeal exudate.  Eyes:     General:        Right eye: No discharge.        Left eye: No discharge.  Neck:     Comments: Anterior neck incision C/D/I- looks great- no drainage or erythema Cardiovascular:     Rate and Rhythm: Normal rate and regular rhythm.     Heart sounds: Normal heart sounds. No murmur heard.    No gallop.  Pulmonary:     Effort: Pulmonary effort is normal. No respiratory distress.     Breath sounds: Normal breath sounds. No wheezing, rhonchi or rales.  Abdominal:     General: There is no distension.     Palpations: Abdomen is soft.     Tenderness: There is no abdominal tenderness.     Comments: Soft, NT, ND,  normoactive BS  Musculoskeletal:     Cervical back: Neck supple.     Comments: RUE 5/5 in biceps, triceps, WE< grip and FA LUE- biceps 5-/5; triceps 4/5; WE 4/5; grip and FA 0/5 RLE- 5/5 in HF, KE, KF DF and PF and EHL LLE- HF 2-/5; KE 4-/5; KF 2/5; DF 4-/5, PF 4/5; EHL 4-/5  Skin:    General: Skin is warm and dry.     Comments: R handed IV Looks OK  Neurological:     Mental Status: He is alert and oriented to person, place, and time.     Comments: Sensation intact to light touch in all 4 extremities except L C8 distribution is reduced DTR's 2+ In biceps, brachioradialis and patella B/L   Psychiatric:        Mood and Affect: Mood normal.        Behavior: Behavior normal.     Results for orders placed or performed during the hospital encounter of 11/13/22 (from the past 48 hour(s))  Glucose, capillary     Status: Abnormal   Collection Time: 11/13/22  3:48 PM  Result Value Ref Range   Glucose-Capillary 150 (H) 70 - 99 mg/dL    Comment: Glucose reference range applies only to samples taken after fasting for at least 8 hours.   Comment 1 Notify RN    Comment 2 Document in Chart    MR CERVICAL SPINE W WO CONTRAST  Result Date: 11/14/2022 CLINICAL DATA:  Initial evaluation for left-sided weakness status post cervical ACDF. EXAM: MRI CERVICAL SPINE WITHOUT AND WITH CONTRAST TECHNIQUE: Multiplanar and multiecho pulse sequences of the cervical spine, to include the craniocervical junction and cervicothoracic junction, were obtained without and with intravenous contrast. CONTRAST:  27m GADAVIST GADOBUTROL 1 MMOL/ML IV SOLN COMPARISON:  Comparison made with prior MRI from 10/24/2022. FINDINGS: Alignment: Straightening of the normal cervical lordosis. No listhesis. Vertebrae: Postoperative changes from prior ACDF at C3 through C6. Hardware appears appropriately positioned. Vertebral body height maintained with no visible acute or chronic fracture. Bone marrow signal intensity within normal  limits. No worrisome osseous lesions. No abnormal marrow edema or enhancement. Cord: Patchy signal abnormality involving the central and left cord at the level of C5-6, concerning for contusion/injury (series 9, images 25-28). Cord is somewhat swollen at this level (series 5, image 9). No abnormal enhancement. Remainder of the visualized cord otherwise demonstrates a normal signal and morphology. No epidural hematoma or other collection. Posterior Fossa, vertebral arteries, paraspinal tissues: Visualized brain and posterior fossa within normal limits. Craniocervical junction normal. Diffuse prevertebral edema, felt to be within normal limits for normal expected postoperative changes. No visible loculated collections. Normal flow voids seen within the vertebral arteries bilaterally. Disc levels: C2-C3: Normal interspace. Mild left-sided facet hypertrophy. No canal or foraminal stenosis. C3-C4: Prior ACDF. No significant residual spinal stenosis. Foramina remain patent. C4-C5: Prior ACDF. No significant residual spinal stenosis. Foramina remain patent. C5-C6: Prior ACDF. Residual mild flattening of the ventral thecal sac, asymmetric to the left, but no significant spinal stenosis. Foramina remain patent. C6-C7: Mild diffuse disc bulge with endplate and uncovertebral spurring. Flattening of the ventral thecal sac without significant spinal stenosis. Moderate bilateral C7 foraminal stenosis, slightly worse on the left. C7-T1: Normal interspace. Left greater than right facet hypertrophy. No spinal stenosis. Foramina remain patent. IMPRESSION: 1. Postoperative changes from prior ACDF at C3 through C6 without significant residual spinal stenosis. 2. Patchy signal abnormality involving the central and left cord at the level of C5-6, concerning for contusion/injury. 3. Disc bulge with uncovertebral spurring at C6-7 with resultant moderate bilateral C7 foraminal stenosis. Electronically Signed   By: BJeannine BogaM.D.    On: 11/14/2022 00:54      Blood pressure (!) 97/57, pulse 92, temperature 98.1 F (36.7 C), temperature source Oral, resp. rate 18, height '6\' 1"'$  (1.854 m), weight 93 kg, SpO2 99 %.  Medical Problem List and Plan: 1. Functional deficits secondary to Cervical myelopathy with L sided weakness s/p C3-6 ACDF  -patient may  shower- cover incision- will see if therapy can use Estim once assessed. No cancer hx.   -ELOS/Goals: 7-10 days- mod I to supervision 2.  Antithrombotics: -DVT/anticoagulation:  Mechanical: Sequential compression devices, below knee Bilateral lower extremities  Will see when can start Lovenox  -antiplatelet therapy: N/A 3. Pain Management: Hydrocodone and flexeril prn for pain  4. Mood/Behavior/Sleep: LCSW to follow for evaluation and support.   --Fexeril prn for muscle pain.   --gabapentin added BID to help LUE/LLE achy pain.    -  antipsychotic agents: N/A 5. Neuropsych/cognition: This patient is capable of making decisions on his own behalf. 6. Skin/Wound Care: Monitor wound for healing.  7. Fluids/Electrolytes/Nutrition: Monitor I/O. Check CMET in am.  8. Renal infarct//hx of CKD/HTN: Monitor BP TID--on metoprolol to keep SBP 90-100 range per wife  --was not taking Cozaar PTA.  9. GERD: Continue PPI. 10. Labs: Wife reports no labs done pre-op.   --Recheck CMET/CBC in am.  11. Constipation- LBM 3 days ago- not on anything scheduled- will schedule bowel meds- if no results by tomorrow, will do Sorbitol.     Bary Leriche, PA-C 11/15/2022  I have personally performed a face to face diagnostic evaluation of this patient and formulated the key components of the plan.  Additionally, I have personally reviewed laboratory data, imaging studies, as well as relevant notes and concur with the physician assistant's documentation above.   The patient's status has not changed from the original H&P.  Any changes in documentation from the acute care chart have been noted above.

## 2022-11-16 LAB — CBC WITH DIFFERENTIAL/PLATELET
Abs Immature Granulocytes: 0.05 10*3/uL (ref 0.00–0.07)
Basophils Absolute: 0 10*3/uL (ref 0.0–0.1)
Basophils Relative: 0 %
Eosinophils Absolute: 0.4 10*3/uL (ref 0.0–0.5)
Eosinophils Relative: 4 %
HCT: 43.4 % (ref 39.0–52.0)
Hemoglobin: 15.2 g/dL (ref 13.0–17.0)
Immature Granulocytes: 1 %
Lymphocytes Relative: 20 %
Lymphs Abs: 1.9 10*3/uL (ref 0.7–4.0)
MCH: 33 pg (ref 26.0–34.0)
MCHC: 35 g/dL (ref 30.0–36.0)
MCV: 94.3 fL (ref 80.0–100.0)
Monocytes Absolute: 0.9 10*3/uL (ref 0.1–1.0)
Monocytes Relative: 10 %
Neutro Abs: 6.2 10*3/uL (ref 1.7–7.7)
Neutrophils Relative %: 65 %
Platelets: 199 10*3/uL (ref 150–400)
RBC: 4.6 MIL/uL (ref 4.22–5.81)
RDW: 11.6 % (ref 11.5–15.5)
WBC: 9.5 10*3/uL (ref 4.0–10.5)
nRBC: 0 % (ref 0.0–0.2)

## 2022-11-16 LAB — COMPREHENSIVE METABOLIC PANEL
ALT: 20 U/L (ref 0–44)
AST: 17 U/L (ref 15–41)
Albumin: 2.9 g/dL — ABNORMAL LOW (ref 3.5–5.0)
Alkaline Phosphatase: 61 U/L (ref 38–126)
Anion gap: 9 (ref 5–15)
BUN: 19 mg/dL (ref 6–20)
CO2: 28 mmol/L (ref 22–32)
Calcium: 8.7 mg/dL — ABNORMAL LOW (ref 8.9–10.3)
Chloride: 98 mmol/L (ref 98–111)
Creatinine, Ser: 1.23 mg/dL (ref 0.61–1.24)
GFR, Estimated: >60 mL/min (ref >60)
Glucose, Bld: 115 mg/dL — ABNORMAL HIGH (ref 70–99)
Potassium: 3.7 mmol/L (ref 3.5–5.1)
Sodium: 135 mmol/L (ref 135–145)
Total Bilirubin: 0.8 mg/dL (ref 0.3–1.2)
Total Protein: 5.7 g/dL — ABNORMAL LOW (ref 6.5–8.1)

## 2022-11-16 MED ORDER — SORBITOL 70 % SOLN
30.0000 mL | Freq: Once | Status: AC
  Administered 2022-11-16: 30 mL via ORAL
  Filled 2022-11-16: qty 30

## 2022-11-16 MED ORDER — ENOXAPARIN SODIUM 40 MG/0.4ML IJ SOSY
40.0000 mg | PREFILLED_SYRINGE | INTRAMUSCULAR | Status: DC
  Administered 2022-11-16 – 2022-11-29 (×14): 40 mg via SUBCUTANEOUS
  Filled 2022-11-16 (×14): qty 0.4

## 2022-11-16 NOTE — Progress Notes (Signed)
Inpatient Rehabilitation  Patient information reviewed and entered into eRehab system by Maryanna Stuber M. Shakenna Herrero, M.A., CCC/SLP, PPS Coordinator.  Information including medical coding, functional ability and quality indicators will be reviewed and updated through discharge.    

## 2022-11-16 NOTE — Evaluation (Signed)
Occupational Therapy Assessment and Plan  Patient Details  Name: Johnny Robinson MRN: [id number] Date of Birth: [birth year]  OT Diagnosis: abnormal posture, acute pain, muscle weakness (generalized), and pain in cervical spine Rehab Potential: Rehab Potential (ACUTE ONLY): Excellent ELOS: 14-18 days   Today's Date: 11/16/2022 OT Individual Time: 0800-0900 OT Individual Time Calculation (min): 60 min     Hospital Problem: Principal Problem:   HNP (herniated nucleus pulposus) with myelopathy, cervical   Past Medical History:  Past Medical History:  Diagnosis Date   Arthritis    Chronic kidney disease 2016   RENAL INFARCT   GERD (gastroesophageal reflux disease)    Hypertension    Reflux    Subdural hematoma ([place]) 1991   Past Surgical History:  Past Surgical History:  Procedure Laterality Date   BACK SURGERY  11/2017   burr hole  1992   to evacuate SDH   COLONOSCOPY WITH PROPOFOL N/A 09/02/2018   Procedure: COLONOSCOPY WITH PROPOFOL;  Surgeon: Lucilla Lame, MD;  Location: [facility];  Service: Endoscopy;  Laterality: N/A;   EXCISION MASS ABDOMINAL Left 05/28/2018   Procedure: EXCISIONAL BIOPSY MASS ABDOMINAL WALL;  Surgeon: Herbert Pun, MD;  Location: [facility];  Service: General;  Laterality: Left;   PERIPHERAL VASCULAR CATHETERIZATION Right 05/18/2015   Procedure: Renal Angiography;  Surgeon: Algernon Huxley, MD;  Location: [facility];  Service: Cardiovascular;  Laterality: Right;   PERIPHERAL VASCULAR CATHETERIZATION Bilateral 05/18/2015   Procedure: Renal Intervention;  Surgeon: Algernon Huxley, MD;  Location: [facility];  Service: Cardiovascular;  Laterality: Bilateral;   TOTAL HIP ARTHROPLASTY Left 03/07/2022   Procedure: TOTAL HIP ARTHROPLASTY ANTERIOR APPROACH;  Surgeon: Gaynelle Arabian, MD;  Location: [facility];  Service: Orthopedics;  Laterality: Left;    Assessment & Plan Clinical Impression:  Johnny Robinson is a 57 year old R handed male with history  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 Dr. Annette Stable expressed concerns of cord contusion due to worsening of myelopathy and MRI spine done which showed post op changes form ACDF C3-C6 and patchy signal abnormality involving the central and left cord at C5/6 concerning for contusion/injury. PT/OT evaluations completed revealing left sided weakness with sensory deficits affecting ADLs, needs +2 mod assist for mobility due to tendency for Knees to buckle with flexed trunk and post op pain. CIR recommended due to functional decline. Patient transferred to CIR on 11/15/2022 .    Patient currently requires max with basic self-care skills and IADL secondary to muscle weakness, decreased cardiorespiratoy endurance, decreased coordination, and decreased standing balance.  Prior to hospitalization, patient could complete self-care independently.  Patient will benefit from skilled intervention to decrease level of assist with basic self-care skills, increase independence with basic self-care skills, and increase level of independence with iADL prior to discharge home with care partner.  Anticipate patient will require intermittent supervision and follow up outpatient.  OT - End of Session Activity Tolerance: Tolerates 10 - 20 min activity with multiple rests Endurance Deficit: Yes Endurance Deficit Description: Expressed fatigue after functional transfers OT Assessment Rehab Potential (ACUTE ONLY): Excellent OT Barriers to Discharge: Home environment access/layout;Lack of/limited family support OT Patient demonstrates impairments in the following area(s): Balance;Endurance;Motor;Pain OT Basic ADL's  Functional Problem(s):  Grooming;Dressing;Toileting;Bathing OT Advanced ADL's Functional Problem(s): Simple Meal Preparation OT Transfers Functional Problem(s): Toilet;Tub/Shower OT Additional Impairment(s): Fuctional Use of Upper Extremity OT Plan OT Intensity: Minimum of 1-2 x/day, 45 to 90 minutes OT Frequency: 5 out of 7 days OT Duration/Estimated Length of Stay: 14-18 days OT Treatment/Interventions: Balance/vestibular training;DME/adaptive equipment instruction;Patient/family education;Therapeutic Activities;Wheelchair propulsion/positioning;Functional electrical stimulation;Therapeutic Exercise;Community reintegration;Functional mobility training;Self Care/advanced ADL retraining;UE/LE Strength taining/ROM;Discharge planning;Neuromuscular re-education;UE/LE Coordination activities;Pain management;Splinting/orthotics OT Self Feeding Anticipated Outcome(s): Mod I OT Basic Self-Care Anticipated Outcome(s): Mod I/supervision OT Toileting Anticipated Outcome(s): Mod I OT Bathroom Transfers Anticipated Outcome(s): Mod I OT Recommendation Recommendations for Other Services: Neuropsych consult;Therapeutic Recreation consult Therapeutic Recreation Interventions: Pet therapy;Stress management Patient destination: Home Follow Up Recommendations: Outpatient OT Equipment Recommended: To be determined   OT Evaluation Precautions/Restrictions  Precautions Precautions: Fall;Cervical Required Braces or Orthoses: Cervical Brace Cervical Brace: Soft collar Restrictions Weight Bearing Restrictions: No Home Living/Prior Functioning Home Living Available Help at Discharge: Family, Available PRN/intermittently (Wife works remotely for [employer] and goes in a couple days a week) Type of Home: House Home Access: Stairs to enter Technical brewer of Steps: 4 Entrance Stairs-Rails: None Home Layout: Multi-level, Bed/bath upstairs, 1/2 bath on main level Alternate Level Stairs-Number of Steps: flight Alternate Level  Stairs-Rails: Right Bathroom Shower/Tub: Walk-in shower, Door (STE, swinging glass door) Armed forces training and education officer: Yes  Lives With: Spouse IADL History Homemaking Responsibilities: Yes Meal Prep Responsibility: Primary Current License: Yes Mode of Transportation: Car Occupation: Full time employment Type of Occupation: Chief Executive Officer in [place] and Hobbies: reports none Prior Function Level of Independence: Independent with basic ADLs, Independent with gait, Independent with homemaking with ambulation  Able to Take Stairs?: Yes Driving: Yes Vocation: Full time employment Vision Baseline Vision/History: 1 Wears glasses (readers) Ability to See in Adequate Light: 0 Adequate Patient Visual Report: No change from baseline Vision Assessment?: No apparent visual deficits Perception  Perception: Within Functional Limits Praxis Praxis: Intact Cognition Cognition Overall Cognitive Status: Within Functional Limits for tasks assessed Arousal/Alertness: Awake/alert Orientation Level: Person;Place;Situation Person: Oriented Place: Oriented Situation: Oriented Memory: Appears intact Awareness: Appears intact Problem Solving: Appears intact Safety/Judgment: Appears intact Brief Interview for Mental Status (BIMS) Repetition of Three Words (First Attempt): 3 Temporal Orientation: Year: Correct Temporal Orientation: Month: Accurate within 5 days Temporal Orientation: Day: Correct Recall: "Sock": Yes, no cue required Recall: "Blue": Yes, no cue required Recall: "Bed": Yes, no cue required BIMS Summary Score: 15 Sensation Sensation Light Touch: Appears Intact Hot/Cold: Appears Intact Proprioception: Impaired Detail Proprioception Impaired Details: Impaired LLE;Impaired LUE (L hand > L arm/shoulder) Additional Comments: reports numbness/tingling has improved since surgery Coordination Gross Motor Movements are Fluid and Coordinated: No Fine Motor Movements  are Fluid and Coordinated: No Coordination and Movement Description: Grossly uncoordinated due to proprioceptive and general strength deficits Finger Nose Finger Test: WFL RUE; impaired LUE Motor  Motor Motor: Other (comment) Motor - Skilled Clinical Observations: hemiparesis of L side  Trunk/Postural Assessment  Cervical Assessment Cervical Assessment: Exceptions to [facility] (forward head) Thoracic Assessment Thoracic Assessment: Exceptions to [facility] (rounded shoulders) Lumbar Assessment Lumbar Assessment: Exceptions to [facility] (posterior pelvic tilt in sitting EOB) Postural Control Postural Control: Within Functional Limits  Balance Balance Balance Assessed: Yes Static Sitting Balance Static Sitting - Balance Support: Feet supported;No upper extremity supported Static Sitting - Level of Assistance: 6: Modified independent (Device/Increase time) Dynamic Sitting Balance Dynamic Sitting - Balance Support: Feet supported;No upper extremity supported Dynamic Sitting - Level of Assistance: 5: Stand by assistance (  supervision) Static Standing Balance Static Standing - Balance Support: No upper extremity supported;During functional activity Static Standing - Level of Assistance: 4: Min assist Dynamic Standing Balance Dynamic Standing - Balance Support: During functional activity;No upper extremity supported Dynamic Standing - Level of Assistance: 3: Mod assist Extremity/Trunk Assessment RUE Assessment RUE Assessment: Exceptions to [facility] Active Range of Motion (AROM) Comments: WFL General Strength Comments: 4-/5 grossly LUE Assessment LUE Assessment: Exceptions to [facility] Active Range of Motion (AROM) Comments: limited to 90 degrees shoulder flexion, elbow WFL, limited in wrist flex/ext, absent in digits General Strength Comments: 3+/5 biceps, 3-/5 triceps, 0-1/5 wrist/digits  Care Tool Care Tool Self Care Eating   Eating Assist Level: Set up assist    Oral Care    Oral Care Assist Level: Set  up assist    Bathing   Body parts bathed by patient: Left arm;Chest;Abdomen;Front perineal area;Right upper leg;Left upper leg;Face;Right lower leg Body parts bathed by helper: Buttocks;Left lower leg;Right arm   Assist Level: Moderate Assistance - Patient 50 - 74%    Upper Body Dressing(including orthotics)   What is the patient wearing?: Pull over shirt   Assist Level: Moderate Assistance - Patient 50 - 74%    Lower Body Dressing (excluding footwear)   What is the patient wearing?: Pants Assist for lower body dressing: Maximal Assistance - Patient 25 - 49%    Putting on/Taking off footwear   What is the patient wearing?: Shoes;Socks Assist for footwear: Total Assistance - Patient < 25%       Care Tool Toileting Toileting activity   Assist for toileting: Maximal Assistance - Patient 25 - 49%     Care Tool Bed Mobility Roll left and right activity        Sit to lying activity        Lying to sitting on side of bed activity         Care Tool Transfers Sit to stand transfer   Sit to stand assist level: Moderate Assistance - Patient 50 - 74%    Chair/bed transfer         Toilet transfer   Assist Level: Maximal Assistance - Patient 24 - 49% (stand pivot)     Care Tool Cognition  Expression of Ideas and Wants Expression of Ideas and Wants: 4. Without difficulty (complex and basic) - expresses complex messages without difficulty and with speech that is clear and easy to understand  Understanding Verbal and Non-Verbal Content Understanding Verbal and Non-Verbal Content: 4. Understands (complex and basic) - clear comprehension without cues or repetitions   Memory/Recall Ability Memory/Recall Ability : Current season;Staff names and faces;That he or she is in a hospital/hospital unit   Refer to Care Plan for [place] [facility] OT Short Term Goal 1 (Week 1): Pt will complete 1/3 toileting steps with mod A for balance OT Short Term Goal 2 (Week  1): Pt will thread LB into clothing utilizing AE PRN with supervision OT Short Term Goal 3 (Week 1): Pt will complete sit > stand using LRAD with CGA in prep for ADL task OT Short Term Goal 4 (Week 1): Pt will complete toilet transfer with min A using LRAD  Recommendations for other services: Neuropsych and Therapeutic Recreation  Pet therapy and Stress management   Skilled Therapeutic Intervention Patient received upright in bed upon therapy arrival and agreeable to participate in OT evaluation. No number reported for pain, however did express discomfort in cervical spine/incision; pre-medicated.  Donned soft cervical collar for comfort as well as offered rest breaks and repositioning for pain reduction.   Education provided on OT purpose, therapy schedule, goals for therapy, and safety policy while in rehab. Resting hand splint in room; education provided on purpose to pt and wife. Patient demonstrates L hemiparesis of the L side, L knee instability in standing/mobility, as well as balance, coordination and endurance deficits resulting in difficulty completing BADL tasks without increased physical assist. Pt will benefit from skilled OT services to focus on mentioned deficits. See below for ADL and functional transfer performance. Pt remained seated in recliner with wife present at conclusion of session with chair pad alarm on and all needs met at end of session.   ADL ADL Eating: Set up Where Assessed-Eating: Bed level Grooming: Setup Where Assessed-Grooming: Edge of bed Upper Body Bathing: Minimal assistance (simulated) Where Assessed-Upper Body Bathing: Edge of bed Lower Body Bathing: Moderate assistance (simulated) Where Assessed-Lower Body Bathing: Edge of bed Upper Body Dressing: Moderate assistance Where Assessed-Upper Body Dressing: Edge of bed Lower Body Dressing: Maximal assistance (simulated) Where Assessed-Lower Body Dressing: Edge of bed Toileting: Maximal assistance Where  Assessed-Toileting: Other (Comment) (simulated at EOB) Toilet Transfer: Maximal assistance Toilet Transfer Method: Stand pivot Science writer: Other (comment) (simulated to recliner) Tub/Shower Transfer: Unable to assess Tub/Shower Transfer Method: Unable to assess Social research officer, government: Unable to assess Social research officer, government Method: Unable to assess Mobility  Bed Mobility Bed Mobility: Right Sidelying to Sit Right Sidelying to Sit: Minimal Assistance - Patient > 75% Transfers Sit to Stand: Moderate Assistance - Patient 50-74%   Discharge Criteria: Patient will be discharged from OT if patient refuses treatment 3 consecutive times without medical reason, if treatment goals not met, if there is a change in medical status, if patient makes no progress towards goals or if patient is discharged from hospital.  The above assessment, treatment plan, treatment alternatives and goals were discussed and mutually agreed upon: by patient and by family  Blase Mess, MS, OTR/L  11/16/2022, 12:22 PM

## 2022-11-16 NOTE — Progress Notes (Signed)
Postop day 4.  Minimal neck pain.  No radicular pain.  Swallowing well.  No wound issues  Afebrile.  Vital signs are stable.  Urine output good.  Awake and alert.  Oriented and appropriate.  Motor examination with left wrist extensor and brachial radialis weakness at 4+/5.  Left triceps weakness at 4/5 and absent grip and intrinsic strength in his left hand.  Beginning to get more hip flexor strength on the left.  Left quadriceps and anterior to analysis 4/5.  Plantar flexors 4+/5.  Overall progressing following significant perioperative spinal cord injury.  Continue efforts at mobilization and therapy.  Hopefully things will continue to improve with time.Marland Kitchen

## 2022-11-16 NOTE — Progress Notes (Signed)
PROGRESS NOTE   Subjective/Complaints:  Patient reports hasn't asked for pain meds yet this Am- rating pain 4/10-   Still hasn't had a BM- has been 4 days- feels like "could go"- we discussed using   ROS:  Pt denies SOB, abd pain, CP, N/V/ (+)C/D, and vision changes Except for HPI  Objective:   No results found. No results for input(s): "WBC", "HGB", "HCT", "PLT" in the last 72 hours. No results for input(s): "NA", "K", "CL", "CO2", "GLUCOSE", "BUN", "CREATININE", "CALCIUM" in the last 72 hours.  Intake/Output Summary (Last 24 hours) at 11/16/2022 0824 Last data filed at 11/16/2022 [id number] Gross per 24 hour  Intake 50 ml  Output 200 ml  Net -150 ml        Physical Exam: Vital Signs Blood pressure 118/66, pulse 80, temperature 98.5 F (36.9 C), temperature source Oral, resp. rate 18, height '6\' 1"'$  (1.854 m), weight 94.2 kg, SpO2 96 %.    General: awake, alert, appropriate, sitting up in bed; just about to eat breakfast;  NAD HENT: conjugate gaze; oropharynx moist CV: regular rate and rhythm; no JVD Pulmonary: CTA B/L; no W/R/R- good air movement GI: soft, NT, ND, (+)BS Psychiatric: appropriate Neurological: Ox3- no change in L C8 sensory changes, nor strength since yesterday Musculoskeletal:     Cervical back: Neck supple.     Comments: RUE 5/5 in biceps, triceps, WE< grip and FA LUE- biceps 5-/5; triceps 4/5; WE 4/5; grip and FA 0/5 RLE- 5/5 in HF, KE, KF DF and PF and EHL LLE- HF 2-/5; KE 4-/5; KF 2/5; DF 4-/5, PF 4/5; EHL 4-/5  Skin:    General: Skin is warm and dry.     Comments: R handed IV Looks OK  Neurological:     Mental Status: He is alert and oriented to person, place, and time.     Comments: Sensation intact to light touch in all 4 extremities except L C8 distribution is reduced DTR's 2+ In biceps, brachioradialis and patella B/L   Assessment/Plan: 1. Functional deficits which require 3+ hours per  day of interdisciplinary therapy in a comprehensive inpatient rehab setting. Physiatrist is providing close team supervision and 24 hour management of active medical problems listed below. Physiatrist and rehab team continue to assess barriers to discharge/monitor patient progress toward functional and medical goals  Care Tool:  Bathing              Bathing assist       Upper Body Dressing/Undressing Upper body dressing        Upper body assist      Lower Body Dressing/Undressing Lower body dressing            Lower body assist       Toileting Toileting    Toileting assist       Transfers Chair/bed transfer  Transfers assist           Locomotion Ambulation   Ambulation assist              Walk 10 feet activity   Assist           Walk 50 feet activity   Assist  Walk 150 feet activity   Assist           Walk 10 feet on uneven surface  activity   Assist           Wheelchair     Assist               Wheelchair 50 feet with 2 turns activity    Assist            Wheelchair 150 feet activity     Assist          Blood pressure 118/66, pulse 80, temperature 98.5 F (36.9 C), temperature source Oral, resp. rate 18, height '6\' 1"'$  (1.854 m), weight 94.2 kg, SpO2 96 %.  Medical Problem List and Plan: 1. Functional deficits secondary to Cervical myelopathy with L sided weakness s/p C3-6 ACDF             -patient may  shower- cover incision- will see if therapy can use Estim once assessed. No cancer hx.              -ELOS/Goals: 7-10 days- mod I to supervision  Con't CIR PT and OT- first day of evaluations- asked pt to take something for pain prior to therapy 2.  Antithrombotics: -DVT/anticoagulation:  Mechanical: Sequential compression devices, below knee Bilateral lower extremities             Will see when can start Lovenox             -antiplatelet therapy: N/A 3. Pain  Management: Hydrocodone and flexeril prn for pain  4. Mood/Behavior/Sleep: LCSW to follow for evaluation and support.              --Fexeril prn for muscle pain.              --gabapentin added BID to help LUE/LLE achy pain.    3/1- has Norco as needed for pain             -antipsychotic agents: N/A 5. Neuropsych/cognition: This patient is capable of making decisions on his own behalf. 6. Skin/Wound Care: Monitor wound for healing.  7. Fluids/Electrolytes/Nutrition: Monitor I/O. Check CMET in am.  8. Renal infarct//hx of CKD/HTN: Monitor BP TID--on metoprolol to keep SBP 90-100 range per wife             --was not taking Cozaar PTA.   3/1- BP well controlled- con't regimen 9. GERD: Continue PPI. 10. Labs: Wife reports no labs done pre-op.   --Recheck CMET/CBC in am.  11. Constipation- LBM 3 days ago- not on anything scheduled- will schedule bowel meds- if no results by tomorrow, will do Sorbitol.   3/1- will give Sorbitol if no BM by 5pm.       LOS: 1 days A FACE TO FACE EVALUATION WAS PERFORMED  Johnny Robinson 11/16/2022, 8:24 AM

## 2022-11-16 NOTE — Plan of Care (Signed)
  Problem: RH Balance Goal: LTG Patient will maintain dynamic standing with ADLs (OT) Description: LTG:  Patient will maintain dynamic standing balance with assist during activities of daily living (OT)  Flowsheets (Taken 11/16/2022 1224) LTG: Pt will maintain dynamic standing balance during ADLs with: Independent with assistive device   Problem: Sit to Stand Goal: LTG:  Patient will perform sit to stand in prep for activites of daily living with assistance level (OT) Description: LTG:  Patient will perform sit to stand in prep for activites of daily living with assistance level (OT) Flowsheets (Taken 11/16/2022 1224) LTG: PT will perform sit to stand in prep for activites of daily living with assistance level: Independent with assistive device   Problem: RH Grooming Goal: LTG Patient will perform grooming w/assist,cues/equip (OT) Description: LTG: Patient will perform grooming with assist, with/without cues using equipment (OT) Flowsheets (Taken 11/16/2022 1224) LTG: Pt will perform grooming with assistance level of: Independent with assistive device    Problem: RH Bathing Goal: LTG Patient will bathe all body parts with assist levels (OT) Description: LTG: Patient will bathe all body parts with assist levels (OT) Flowsheets (Taken 11/16/2022 1224) LTG: Pt will perform bathing with assistance level/cueing: Supervision/Verbal cueing   Problem: RH Dressing Goal: LTG Patient will perform upper body dressing (OT) Description: LTG Patient will perform upper body dressing with assist, with/without cues (OT). Flowsheets (Taken 11/16/2022 1224) LTG: Pt will perform upper body dressing with assistance level of: Independent with assistive device Goal: LTG Patient will perform lower body dressing w/assist (OT) Description: LTG: Patient will perform lower body dressing with assist, with/without cues in positioning using equipment (OT) Flowsheets (Taken 11/16/2022 1224) LTG: Pt will perform lower body  dressing with assistance level of: Independent with assistive device   Problem: RH Toileting Goal: LTG Patient will perform toileting task (3/3 steps) with assistance level (OT) Description: LTG: Patient will perform toileting task (3/3 steps) with assistance level (OT)  Flowsheets (Taken 11/16/2022 1224) LTG: Pt will perform toileting task (3/3 steps) with assistance level: Independent with assistive device   Problem: RH Functional Use of Upper Extremity Goal: LTG Patient will use RT/LT upper extremity as a (OT) Description: LTG: Patient will use right/left upper extremity as a stabilizer/gross assist/diminished/nondominant/dominant level with assist, with/without cues during functional activity (OT) Flowsheets (Taken 11/16/2022 1224) LTG: Use of upper extremity in functional activities: LUE as diminished level LTG: Pt will use upper extremity in functional activity with assistance level of: Independent with assistive device   Problem: RH Toilet Transfers Goal: LTG Patient will perform toilet transfers w/assist (OT) Description: LTG: Patient will perform toilet transfers with assist, with/without cues using equipment (OT) Flowsheets (Taken 11/16/2022 1224) LTG: Pt will perform toilet transfers with assistance level of: Independent with assistive device   Problem: RH Tub/Shower Transfers Goal: LTG Patient will perform tub/shower transfers w/assist (OT) Description: LTG: Patient will perform tub/shower transfers with assist, with/without cues using equipment (OT) Flowsheets (Taken 11/16/2022 1224) LTG: Pt will perform tub/shower stall transfers with assistance level of: Supervision/Verbal cueing

## 2022-11-16 NOTE — Progress Notes (Signed)
Occupational Therapy Session Note  Patient Details  Name: Johnny Robinson MRN: [id number] Date of Birth: 02/19/1966  Today's Date: 11/16/2022 OT Individual Time: [id number] OT Individual Time Calculation (min): 70 min    Short Term Goals: Week 1:  OT Short Term Goal 1 (Week 1): Pt will complete 1/3 toileting steps with mod A for balance OT Short Term Goal 2 (Week 1): Pt will thread LB into clothing utilizing AE PRN with supervision OT Short Term Goal 3 (Week 1): Pt will complete sit > stand using LRAD with CGA in prep for ADL task OT Short Term Goal 4 (Week 1): Pt will complete toilet transfer with min A using LRAD  Skilled Therapeutic Interventions/Progress Updates:  OTA initiated OT session after consultation with OTR regarding eval and POC. Pt resting in recliner upon arrival with wife present. Recliner was not functioning properly and swapped out. Stand pivot transfer with mod A. Reviewed home setup and role of OT during Rehab stay. Pt with weak LUE shoulder and elbow flexion, in addition to wrist flexion and trace extension.   Focus on functional grasp and release with e-stim:  1:1 NMES applied to LUE wrist and finger flexors to facilitate functional grasp  Ratio 1:3 Rate 35 pps Waveform- Asymmetric Ramp 1.0 Pulse 300 Intensity- 15 Duration -  10 mins  No adverse reactions after treatment and is skin intact.   1:1 NMES applied to LUE wrist and finger extensors to facilitate functional release  Ratio 1:3 Rate 35 pps Waveform- Asymmetric Ramp 1.0 Pulse 300 Intensity- 21 Duration -   10 mins  No adverse reactions after treatment and is skin intact.   Pt remained in recliner with all needs within reach. Wife present.    Therapy Documentation Precautions:  Precautions Precautions: Fall, Cervical Required Braces or Orthoses: Cervical Brace Cervical Brace: Soft collar Restrictions Weight Bearing Restrictions: No   Pain: Pain Assessment Pain Scale: 0-10 Pain Score: 6   Pain Type: Acute pain Pain Location: Shoulder Pain Orientation: Right Pt received meds at beginning of session   Therapy/Group: Individual Therapy  [name] 11/16/2022, 12:05 PM

## 2022-11-16 NOTE — Progress Notes (Signed)
Physical Therapy Assessment and Plan  Patient Details  Name: Johnny Robinson MRN: [id number] Date of Birth: 1966/08/14  PT Diagnosis: Abnormal posture, Abnormality of gait, Difficulty walking, and Hemiparesis non-dominant Rehab Potential: Excellent ELOS: 2.5-3 weeks   Today's Date: 11/16/2022 PT Individual Time: 1300-1415 PT Individual Time Calculation (min): 75 min    Hospital Problem: Principal Problem:   HNP (herniated nucleus pulposus) with myelopathy, cervical   Past Medical History:  Past Medical History:  Diagnosis Date   Arthritis    Chronic kidney disease 2016   RENAL INFARCT   GERD (gastroesophageal reflux disease)    Hypertension    Reflux    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   Past Surgical History:  Past Surgical History:  Procedure Laterality Date   BACK SURGERY  11/2017   burr hole  1992   to evacuate SDH   COLONOSCOPY WITH PROPOFOL N/A 09/02/2018   Procedure: COLONOSCOPY WITH PROPOFOL;  Surgeon: Lucilla Lame, MD;  Location: [facility] ENDOSCOPY;  Service: Endoscopy;  Laterality: N/A;   EXCISION MASS ABDOMINAL Left 05/28/2018   Procedure: EXCISIONAL BIOPSY MASS ABDOMINAL WALL;  Surgeon: Herbert Pun, MD;  Location: [facility] ORS;  Service: General;  Laterality: Left;   PERIPHERAL VASCULAR CATHETERIZATION Right 05/18/2015   Procedure: Renal Angiography;  Surgeon: Algernon Huxley, MD;  Location: [place] [facility];  Service: Cardiovascular;  Laterality: Right;   PERIPHERAL VASCULAR CATHETERIZATION Bilateral 05/18/2015   Procedure: Renal Intervention;  Surgeon: Algernon Huxley, MD;  Location: [place] [facility];  Service: Cardiovascular;  Laterality: Bilateral;   TOTAL HIP ARTHROPLASTY Left 03/07/2022   Procedure: TOTAL HIP ARTHROPLASTY ANTERIOR APPROACH;  Surgeon: Gaynelle Arabian, MD;  Location: [facility] ORS;  Service: Orthopedics;  Laterality: Left;    Assessment & Plan Clinical Impression: Johnny Robinson is a 57 year old R handed male with 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 Dr. Annette Stable expressed concerns of cord contusion due to worsening of myelopathy and MRI spine done which showed post op changes form ACDF C3-C6 and patchy signal abnormality involving the central and left cord at C5/6 concerning for contusion/injury. PT/OT evaluations completed revealing left sided weakness with sensory deficits affecting ADLs, needs +2 mod assist for mobility due to tendency for Knees to buckle with flexed trunk and post op pain. CIR recommended due to functional decline. Patient transferred to CIR on 11/15/2022 .   Patient currently requires mod with mobility secondary to muscle weakness, impaired timing and sequencing, unbalanced muscle activation, decreased coordination, and decreased motor planning, and decreased standing balance, decreased postural control, hemiplegia, and decreased balance strategies.  Prior to hospitalization, patient was independent  with mobility and lived with Spouse in a House home.  Home access is 4Stairs to enter.  Patient will benefit from skilled PT intervention to maximize safe functional mobility, minimize fall risk, and decrease caregiver burden for planned discharge home with 24 hour supervision.  Anticipate patient will benefit from follow up OP at discharge.  PT - End of Session Activity Tolerance: Tolerates 30+ min activity with multiple rests Endurance Deficit: Yes Endurance Deficit Description: Expressed fatigue after functional transfers PT Assessment Rehab Potential (ACUTE/IP ONLY): Excellent PT Barriers to Discharge: [place] home environment;Home environment access/layout;Neurogenic Bowel & Bladder;Wound Care  PT Barriers to Discharge  Comments: full flight of stairs to bed/bath upstairs PT Patient demonstrates impairments in the following area(s): Balance;Pain;Endurance;Safety;Sensory;Motor;Skin Integrity PT Transfers Functional Problem(s): Bed Mobility;Bed to Chair;Car PT Locomotion Functional Problem(s): Ambulation;Stairs PT Plan PT Intensity: Minimum of 1-2 x/day ,45 to 90 minutes PT Frequency: 5 out of 7 days PT Duration Estimated Length of Stay: 2.5-3 weeks PT Treatment/Interventions: Ambulation/gait training;Discharge planning;DME/adaptive equipment instruction;Functional mobility training;Pain management;Psychosocial support;Splinting/orthotics;Therapeutic Activities;UE/LE Strength taining/ROM;Visual/perceptual remediation/compensation;Wheelchair propulsion/positioning;UE/LE Coordination activities;Therapeutic Exercise;Stair training;Skin care/wound management;Neuromuscular re-education;Functional electrical stimulation;Patient/family education;Disease management/prevention;Community reintegration;Balance/vestibular training PT Transfers Anticipated Outcome(s): supervision transfers PT Locomotion Anticipated Outcome(s): CGA gait with LRAD PT Recommendation Follow Up Recommendations: Outpatient PT Patient destination: Home Equipment Recommended: Rolling walker with 5" wheels;To be determined Equipment Details: possible transport chair   PT Evaluation Precautions/Restrictions Precautions Precautions: Fall;Cervical Precaution Comments: reviewed cervical precautions Required Braces or Orthoses: Cervical Brace Cervical Brace: Soft collar Restrictions Weight Bearing Restrictions: No General   Vital SignsTherapy Vitals Temp: 98.2 F (36.8 C) Pulse Rate: 95 Resp: 16 BP: 117/68 Patient Position (if appropriate): Sitting Oxygen Therapy SpO2: 97 % O2 Device: Room Air Pain Pain Assessment Pain Scale: 0-10 Pain Score: 6  Pain Type: Acute pain Pain Location: Shoulder Pain Orientation: Right Pain Descriptors /  Indicators: Aching Pain Frequency: Intermittent Pain Onset: Progressive Patients Stated Pain Goal: 4 Pain Intervention(s): Medication (See eMAR) Pain Interference Pain Interference Pain Effect on Sleep: 2. Occasionally Pain Interference with Therapy Activities: 1. Rarely or not at all Pain Interference with Day-to-Day Activities: 1. Rarely or not at all Home Living/Prior [place]: Spouse/significant other;Children Available Help at Discharge: Family;Available PRN/intermittently (wife will be able to work remote) Type of Home: House Home Access: Stairs to enter Technical brewer of Steps: 4 Entrance Stairs-Rails: None Home Layout: Multi-level;Bed/bath upstairs;1/2 bath on main level Alternate Level Stairs-Number of Steps: flight Alternate Level Stairs-Rails: Right  Lives With: Spouse Prior Function  Able to Take Stairs?: Yes Driving: Yes Vocation: Full time employment Vision/Perception  Vision - History Ability to See in Adequate Light: 0 Adequate Perception Perception: Within Functional Limits Praxis Praxis: Intact  Cognition Overall Cognitive Status: Within Functional Limits for tasks assessed Arousal/Alertness: Awake/alert Orientation Level: Oriented X4 Memory: Appears intact Awareness: Appears intact Problem Solving: Appears intact Safety/Judgment: Appears intact Sensation Sensation Proprioception Impaired Details: Impaired LLE;Impaired LUE Additional Comments: reports numbness/tingling has improved since surgery, Pt reports LLE 25% vs RLE Coordination Gross Motor Movements are Fluid and Coordinated: No Coordination and Movement Description: Grossly uncoordinated due to proprioceptive and general strength deficits Motor  Motor Motor: Other (comment) Motor - Skilled Clinical Observations: hemiparesis of L side   Trunk/Postural Assessment  Cervical Assessment Cervical Assessment: Exceptions to [facility] (cervical  precautions) Thoracic Assessment Thoracic Assessment: Within Functional Limits Lumbar Assessment Lumbar Assessment: Within Functional Limits Postural Control Postural Control: Within Functional Limits  Balance Balance Balance Assessed: Yes Static Sitting Balance Static Sitting - Balance Support: Feet supported;No upper extremity supported Static Sitting - Level of Assistance: 6: Modified independent (Device/Increase time) Dynamic Sitting Balance Dynamic Sitting - Balance Support: Feet supported;No upper extremity supported Dynamic Sitting - Level of Assistance: 5: Stand by assistance Static Standing Balance Static Standing - Balance Support: No upper extremity supported;During functional activity Static Standing - Level of Assistance: 4: Min assist Dynamic Standing Balance Dynamic Standing - Balance Support: During functional activity;No upper extremity supported Dynamic Standing - Level of Assistance: 3: Mod assist Extremity Assessment      RLE Assessment RLE Assessment: Within Functional Limits General Strength  Comments: Grossly 4+/5 LLE Assessment LLE Assessment: Exceptions to [facility] LLE Strength Left Hip Flexion: 0/5 Left Knee Flexion: 1/5 Left Knee Extension: 1/5 Left Ankle Dorsiflexion: 2/5 Left Ankle Plantar Flexion: 2/5  Care Tool Care Tool Bed Mobility Roll left and right activity        Sit to lying activity        Lying to sitting on side of bed activity         Care Tool Transfers Sit to stand transfer        Chair/bed transfer         Toilet transfer        Car transfer          Care Tool Locomotion Ambulation          Walk 10 feet activity         Walk 50 feet with 2 turns activity        Walk 150 feet activity        Walk 10 feet on uneven surfaces activity        Stairs          Walk up/down 1 step activity        Walk up/down 4 steps activity        Walk up/down 12 steps activity        Pick up small objects  from floor        Wheelchair            Wheel 50 feet with 2 turns activity      Wheel 150 feet activity        Refer to Care Plan for Long Term Goals  SHORT TERM GOAL WEEK 1 PT Short Term Goal 1 (Week 1): Pt will ambulate 150 ft with min A PT Short Term Goal 2 (Week 1): Pt will ambulate 100 ft with no incidence of knee buckling PT Short Term Goal 3 (Week 1): Pt will perform standing activities with short bouts of UUE support  Recommendations for other services: None   Skilled Therapeutic Intervention Evaluation completed (see details above) with patient education regarding purpose of PT evaluation, PT POC and goals, therapy schedule, weekly team meetings, and other CIR information including safety plan and fall risk safety. No complaint of pain. Pt set up with RW with walker splint and L GRAFO. [place] significant gait improvement as brace assists with knee buckling.  Pt performed the below functional mobility tasks with the specified levels of skilled cuing and assistance. Pt returned to room after session and was left with all needs in reach and alarm active.     Mobility Bed Mobility Bed Mobility: Right Sidelying to Sit;Sit to Supine Right Sidelying to Sit: Minimal Assistance - Patient > 75% Sit to Supine: Supervision/Verbal cueing Transfers Transfers: Sit to Stand;Stand Pivot Transfers Sit to Stand: Minimal Assistance - Patient > 75% Stand Pivot Transfers: Moderate Assistance - Patient 50 - 74% Stand Pivot Transfer Details: Verbal cues for sequencing;Manual facilitation for weight shifting;Verbal cues for technique Transfer (Assistive device): Rolling walker Locomotion  Gait Ambulation: Yes Gait Assistance: Moderate Assistance - Patient 50-74% Gait Distance (Feet): 140 Feet Assistive device: Rolling walker (L RW splint and GRAFO) Gait Assistance Details: Manual facilitation for placement;Manual facilitation for weight shifting;Verbal cues for gait pattern;Verbal cues  for sequencing;Verbal cues for precautions/safety;Verbal cues for safe use of DME/AE Gait Gait: Yes Gait Pattern: Impaired Gait Pattern: Step-through pattern;Narrow base of support;Poor foot clearance - left;Decreased step length -  left Gait velocity: decreased Stairs / Additional Locomotion Stairs: Yes Stairs Assistance: Moderate Assistance - Patient 50 - 74% Stair Management Technique: Two rails Number of Stairs: 8 Height of Stairs: 3 Wheelchair Mobility Wheelchair Mobility: No   Discharge Criteria: Patient will be discharged from PT if patient refuses treatment 3 consecutive times without medical reason, if treatment goals not met, if there is a change in medical status, if patient makes no progress towards goals or if patient is discharged from hospital.  The above assessment, treatment plan, treatment alternatives and goals were discussed and mutually agreed upon: by patient  Mickel Fuchs 11/16/2022, 3:54 PM

## 2022-11-16 NOTE — Progress Notes (Signed)
Met with patient and wife at bedside. Oriented to rehab. Discussed team conference every Tuesday. SW will update information from team conference. Discussed educational binder. Wife reported that nurse had already discussed with them. Patient wearing soft collar and resting in recliner. Wife working on [employer] paperwork and working remotely. Dr Annette Stable in to see patient. All needs met.

## 2022-11-16 NOTE — Progress Notes (Signed)
Inpatient Rehabilitation Admission Medication Review by a Pharmacist  A complete drug regimen review was completed for this patient to identify any potential clinically significant medication issues.  High Risk Drug Classes Is patient taking? Indication by Medication  Antipsychotic Yes Compazine- N/V  Anticoagulant No   Antibiotic Yes Lamisil- onychomycosis  Opioid Yes Norco- acute pain  Antiplatelet No   Hypoglycemics/insulin No   Vasoactive Medication Yes Toprol- HTN  Chemotherapy No   Other Yes Protonix- GERD Lipitor- HLD Trazodone- sleep Benadryl- itching Flexeril- muscle spasms Gabapentin- neuropathic pain     Type of Medication Issue Identified Description of Issue Recommendation(s)  Drug Interaction(s) (clinically significant)     Duplicate Therapy     Allergy     No Medication Administration End Date     Incorrect Dose     Additional Drug Therapy Needed     Significant med changes from prior encounter (inform family/care partners about these prior to discharge).    Other        Clinically significant medication issues were identified that warrant physician communication and completion of prescribed/recommended actions by midnight of the next day:  No   Time spent performing this drug regimen review (minutes):  30   Johnny Robinson BS, PharmD, BCPS Clinical Pharmacist 11/16/2022 6:59 AM  Contact: [phone] after 3 PM  "Be curious, not judgmental..." -Jamal Maes

## 2022-11-16 NOTE — Progress Notes (Signed)
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   Past Surgical History:  Past Surgical History:  Procedure Laterality Date   BACK SURGERY  11/2017   burr hole  1992   to evacuate SDH   COLONOSCOPY WITH PROPOFOL N/A 09/02/2018   Procedure: COLONOSCOPY WITH PROPOFOL;  Surgeon: Lucilla Lame, MD;  Location: [facility] ENDOSCOPY;  Service: Endoscopy;  Laterality: N/A;   EXCISION MASS ABDOMINAL Left 05/28/2018   Procedure: EXCISIONAL BIOPSY MASS ABDOMINAL WALL;  Surgeon: Herbert Pun, MD;  Location: [facility] ORS;  Service: General;  Laterality: Left;   PERIPHERAL VASCULAR CATHETERIZATION Right 05/18/2015   Procedure: Renal Angiography;  Surgeon: Algernon Huxley, MD;  Location: [place] CV LAB;  Service: Cardiovascular;  Laterality: Right;   PERIPHERAL VASCULAR CATHETERIZATION Bilateral 05/18/2015   Procedure: Renal Intervention;  Surgeon: Algernon Huxley, MD;  Location: [place] CV LAB;  Service: Cardiovascular;  Laterality: Bilateral;   TOTAL HIP ARTHROPLASTY Left 03/07/2022   Procedure: TOTAL HIP ARTHROPLASTY ANTERIOR APPROACH;  Surgeon: Gaynelle Arabian, MD;  Location: [facility] ORS;  Service: Orthopedics;  Laterality: Left;   Social History:  reports that he has never smoked. He has never used smokeless tobacco. He reports current alcohol use. He reports that he does not use drugs.  Family / Support Systems Marital Status: Married How Long?: 20 years Patient Roles: Spouse, Parent Spouse/Significant Other: Johnny Robinson (wife) [phone] Children: 3  children- caroline (dtr; 76 yrs old- in college), Myriam Jacobson (18, dtr; in college), and Ronney Lion (son; lives at home). Other Supports: PRN from his parents and friends Anticipated Caregiver: Wife Ability/Limitations of Caregiver: Pt wife reports she will be working remotely from home and able to provide assistance. Reports their son will help as well-PRN when he is not in school. Caregiver Availability: 24/7 Family Dynamics: Pt lives with his wife, and their youngest child [place].  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) Return to Work Plans: TBD Public relations account executive Issues: Denies Insurance account manager: Denies   Abuse/Neglect Abuse/Neglect Assessment Can Be Completed: Yes Physical Abuse: Denies Verbal Abuse: Denies Sexual Abuse: Denies Exploitation of patient/patient's resources: Denies Self-Neglect: Denies  Patient response to: Social Isolation - How often do you feel lonely or isolated from those around you?: Never  Emotional Status Pt's affect, behavior and adjustment status: Pt in good spirits at time of visit Recent Psychosocial Issues: Denies Psychiatric History: Denies Substance Abuse History: Pt denies tobacco product use or rec drug use. Occassional etoh.  Patient / Family Perceptions, Expectations & Goals Pt/Family understanding of illness & functional limitations: Pt and wife have a general understanding of pt care needs Premorbid pt/family roles/activities: Independent Anticipated changes in roles/activities/participation: Assistance with ADLs/IADLs Pt/family expectations/goals: Pt  goal is to work on mobility  ovreall. Wife would like for him to continue to work oin mobility as well, ambulates stairs, and walk even with a RW; work in left leg.  Community Resources [employer]s: None Premorbid Home Care/DME Agencies: None Transportation available at discharge: Wife Is the patient able to respond to transportation needs?: Yes In the past 12 months, has lack of transportation kept you from medical appointments or from getting medications?: No In the past 12 months, has lack of transportation kept you from meetings, work, or from getting things needed for daily living?: No Resource referrals recommended: Neuropsychology  Discharge Planning Living Arrangements: Spouse/significant other, Children Support Systems: Spouse/significant other, Children, Friends/neighbors, Other relatives Type of Residence: Private residence Insurance Resources: Multimedia programmer (specify) ([place] [employer] PPO) Financial Resources: Employment Financial Screen Referred: No Living Expenses: Medical laboratory scientific officer Management: Spouse Does the patient have any problems obtaining your medications?: No Home Management: Pt manages all meals, and both help with housecleaning tasks. Patient/Family Preliminary Plans: TBD Care Coordinator Barriers to Discharge: Decreased caregiver support, Lack of/limited family support Care Coordinator Anticipated Follow Up Needs: HH/OP  Clinical Impression SW met with pt and pt wife Johnny Robinson in room to introduce self, explain role, and discuss discharge process. Pt is not a English as a second language teacher. NO HCPOA. Access to DME: RW and cane (has from hip surgery last year). Both aware SW will continue to provide updates as available.   Johnna Bollier A Audreana Hancox 11/16/2022, 1:44 PM

## 2022-11-17 MED ORDER — MAGNESIUM CITRATE PO SOLN
0.5000 | Freq: Once | ORAL | Status: AC
  Administered 2022-11-17: 0.5 via ORAL
  Filled 2022-11-17: qty 296

## 2022-11-17 MED ORDER — SORBITOL 70 % SOLN
30.0000 mL | Freq: Every day | Status: DC | PRN
  Administered 2022-11-17: 30 mL via ORAL
  Filled 2022-11-17: qty 30

## 2022-11-17 MED ORDER — SORBITOL 70 % SOLN: 30.0000 mL | Freq: Every day | Status: DC | PRN

## 2022-11-17 NOTE — Progress Notes (Signed)
Per pt, last BM was 2/26. + bowel sounds appreciated. Offered suppository last night. Pt refused since he already took  several laxatives but agreed to have it later today if still no bowel movement

## 2022-11-17 NOTE — IPOC Note (Signed)
Overall Plan of Care [facility]) Patient Details Name: Johnny Robinson MRN: [id number] DOB: 1966/04/11  Admitting Diagnosis: HNP (herniated nucleus pulposus) with myelopathy, cervical  Hospital Problems: Principal Problem:   HNP (herniated nucleus pulposus) with myelopathy, cervical     Functional Problem List: Nursing Endurance, Medication Management, Pain, Safety  PT Balance, Pain, Endurance, Safety, Sensory, Motor, Skin Integrity  OT Balance, Endurance, Motor, Pain  SLP    TR         Basic ADL's: OT Grooming, Dressing, Toileting, Bathing     Advanced  ADL's: OT Simple Meal Preparation     Transfers: PT Bed Mobility, Bed to Chair, Car  OT Toilet, Tub/Shower     Locomotion: PT Ambulation, Stairs     Additional Impairments: OT Fuctional Use of Upper Extremity  SLP        TR      Anticipated Outcomes Item Anticipated Outcome  Self Feeding Mod I  Swallowing      Basic self-care  Mod I/supervision  Toileting  Mod I   Bathroom Transfers Mod I  Bowel/Bladder  Patient will remain continent f bowel and bladder with min assist  Transfers  supervision transfers  Locomotion  CGA gait with LRAD  Communication     Cognition     Pain  pain will be less than or equal to 4/10  Safety/Judgment  patient will be free from falls/injury and making appropriate safety decisions   Therapy Plan: PT Intensity: Minimum of 1-2 x/day ,45 to 90 minutes PT Frequency: 5 out of 7 days PT Duration Estimated Length of Stay: 2.5-3 weeks OT Intensity: Minimum of 1-2 x/day, 45 to 90 minutes OT Frequency: 5 out of 7 days OT Duration/Estimated Length of Stay: 14-18 days     Team Interventions: Nursing Interventions Patient/Family Education, Pain Management, Skin Care/Wound Management, Discharge Planning  PT interventions Ambulation/gait training, Discharge planning, DME/adaptive equipment instruction, Functional mobility training, Pain management, Psychosocial support,  Splinting/orthotics, Therapeutic Activities, UE/LE Strength taining/ROM, Visual/perceptual remediation/compensation, Wheelchair propulsion/positioning, UE/LE Coordination activities, Therapeutic Exercise, Stair training, Skin care/wound management, Neuromuscular re-education, Functional electrical stimulation, Patient/family education, Disease management/prevention, Academic librarian, Training and development officer  OT Interventions Training and development officer, Engineer, drilling, Patient/family education, Therapeutic Activities, Wheelchair propulsion/positioning, Functional electrical stimulation, Therapeutic Exercise, Community reintegration, Functional mobility training, Self Care/advanced ADL retraining, UE/LE Strength taining/ROM, Discharge planning, Neuromuscular re-education, UE/LE Coordination activities, Pain management, Splinting/orthotics  SLP Interventions    TR Interventions    SW/CM Interventions Discharge Planning, Psychosocial Support, Patient/Family Education   Barriers to Discharge MD  Medical stability, Home enviroment access/loayout, Neurogenic bowel and bladder, Wound care, and Weight bearing restrictions  Nursing Decreased caregiver support, Home environment access/layout main level B/B with 4 ste 0 rail entry. 15 ste upstairs with rail on right  PT Inaccessible home environment, Home environment access/layout, Neurogenic Bowel & Bladder, Wound Care full flight of stairs to bed/bath upstairs  OT Home environment access/layout, Lack of/limited family support    SLP      SW Decreased caregiver support, Lack of/limited family support     Team Discharge Planning: Destination: PT-Home ,OT- Home , SLP-  Projected Follow-up: PT-Outpatient PT, OT-  Outpatient OT, SLP-  Projected Equipment Needs: PT-Rolling walker with 5" wheels, To be determined, OT- To be determined, SLP-  Equipment Details: PT-possible transport chair, OT-  Patient/family involved in  discharge planning: PT- Patient, Family member/caregiver,  OT-Patient, Family member/caregiver, SLP-   MD ELOS: 2.5-3 weeks Medical Rehab Prognosis:  Good Assessment: The patient has been admitted  for CIR therapies with the diagnosis of cervical myelopathy. The team will be addressing functional mobility, strength, stamina, balance, safety, adaptive techniques and equipment, self-care, bowel and bladder mgt, patient and caregiver education, . Goals have been set at mod I to supervision. Anticipated discharge destination is home with wife.        See Team Conference Notes for weekly updates to the plan of care

## 2022-11-17 NOTE — Progress Notes (Signed)
Physical Medicine and Rehabilitation Consult Reason for Consult: Cervical myelopathy s/p C3-C6 ACDF Referring Physician: Earnie Larsson, MD     HPI: Johnny Robinson is a 57 y.o. male who presents with left sided weakness after C3-C6 ACDF for cervical myelopathy. MRI shows patchy signal abnormality involving the central and L cord and C5-C6 concerning for contusion/injury. PMH is significant for L THA 6/23, OA, GERD< CKD, HTN, lumbar surgery x3, SHD in 1990s.      ROS +left lower extremity weakness     Past Medical History:  Diagnosis Date   Arthritis     Chronic kidney disease 2016    RENAL INFARCT   GERD (gastroesophageal reflux disease)     Hypertension     Reflux     Subdural hematoma ([place]) 1991         Past Surgical History:  Procedure Laterality Date   BACK SURGERY   11/2017   COLONOSCOPY WITH PROPOFOL N/A 09/02/2018    Procedure: COLONOSCOPY WITH PROPOFOL;  Surgeon: Lucilla Lame, MD;  Location: [facility];  Service: Endoscopy;  Laterality: N/A;   EXCISION MASS ABDOMINAL Left 05/28/2018    Procedure: EXCISIONAL BIOPSY MASS ABDOMINAL WALL;  Surgeon: Herbert Pun, MD;  Location: [facility];  Service: General;  Laterality: Left;   PERIPHERAL VASCULAR CATHETERIZATION Right 05/18/2015    Procedure: Renal Angiography;  Surgeon: Algernon Huxley, MD;  Location: [place] [facility];  Service: Cardiovascular;  Laterality: Right;   PERIPHERAL VASCULAR CATHETERIZATION Bilateral 05/18/2015    Procedure: Renal Intervention;  Surgeon: Algernon Huxley, MD;  Location: [place] [facility];  Service: Cardiovascular;  Laterality: Bilateral;   TOTAL HIP ARTHROPLASTY Left 03/07/2022    Procedure: TOTAL HIP ARTHROPLASTY ANTERIOR APPROACH;  Surgeon: Gaynelle Arabian, MD;  Location: [facility];  Service: Orthopedics;  Laterality: Left;         Family History  Problem Relation Age of Onset   Nephrolithiasis Mother     Cancer Father      Social History:  reports that he has never smoked. He has never used  smokeless tobacco. He reports current alcohol use. He reports that he does not use drugs. Allergies: No Known Allergies       Medications Prior to Admission  Medication Sig Dispense Refill   acetaminophen (TYLENOL) 500 MG tablet Take 500-1,000 mg by mouth every 6 (six) hours as needed (pain).       atorvastatin (LIPITOR) 20 MG tablet Take 1 tablet (20 mg total) by mouth once daily (Patient not taking: Reported on 02/19/2022) 90 tablet 3   atorvastatin (LIPITOR) 20 MG tablet Take 1 tablet (20 mg total) by mouth once daily 90 tablet 3   cyclobenzaprine (FLEXERIL) 10 MG tablet Take 1 tablet (10 mg total) by mouth 3 (three) times daily as needed for muscle spasms 30 tablet 0   HYDROcodone-acetaminophen (NORCO/VICODIN) 5-325 MG tablet Take 1 to 2 tablets by mouth every 6 (six) hours as needed for severe pain. 42 tablet 0   HYDROcodone-acetaminophen (NORCO/VICODIN) 5-325 MG tablet Take 1 tablet by mouth every 4 (four) hours as needed for pain 30 tablet 0   ibuprofen (ADVIL) 800 MG tablet Take 1 tablet (800 mg total) by mouth every 6 (six) hours as needed. 60 tablet 1   losartan (COZAAR) 100 MG tablet TAKE 1 TABLET BY MOUTH ONCE DAILY (Patient not taking: Reported on 02/19/2022) 90 tablet 3   losartan (COZAAR) 100 MG tablet Take 1 tablet (100 mg total) by mouth once daily 90  tablet 3   methocarbamol (ROBAXIN) 500 MG tablet Take 1 tablet (500 mg total) by mouth every 6 (six) hours as needed for muscle spasms. 40 tablet 0   methocarbamol (ROBAXIN) 500 MG tablet Take 1 tablet by mouth every 6 hours as needed for muscle spasms 40 tablet 0   methylPREDNISolone (MEDROL DOSEPAK) 4 MG TBPK tablet Take as directed per package directions 21 tablet 0   metoprolol succinate (TOPROL-XL) 25 MG 24 hr tablet Take 1 tablet (25 mg total) by mouth once daily 90 tablet 3   metoprolol succinate (TOPROL-XL) 25 MG 24 hr tablet Take 1 tablet (25 mg total) by mouth once daily (Patient not taking: Reported on 02/19/2022) 90 tablet 3    Multiple Vitamin (MULTIVITAMIN WITH MINERALS) TABS tablet Take 1 tablet by mouth in the morning.       omeprazole (PRILOSEC) 40 MG capsule TAKE ONE CAPSULE BY MOUTH EVERY MORNING 90 capsule 3   omeprazole (PRILOSEC) 40 MG capsule Take 1 capsule (40 mg total) by mouth once daily 90 capsule 3   oxyCODONE-acetaminophen (PERCOCET) 5-325 MG tablet Take 1 tablet by mouth every 4 (four) hours as needed for severe pain. 30 tablet 0   predniSONE (DELTASONE) 20 MG tablet Take 1 tablet (20 mg total) by mouth daily. 7 tablet 0   terbinafine (LAMISIL) 250 MG tablet Take 1 tablet by mouth once a day 30 tablet 3   terbinafine (LAMISIL) 250 MG tablet Take 1 tablet (250 mg total) by mouth daily. 30 tablet 3   traMADol (ULTRAM) 50 MG tablet Take 1-2 tablets (50-100 mg total) by mouth every 6 (six) hours as needed for moderate pain. 40 tablet 0   traMADol (ULTRAM) 50 MG tablet Take 1 to 2 tablets by mouth every 6 hours as needed for moderate pain. 40 tablet 0      Home: Home Living Family/patient expects to be discharged to:: Private residence Living Arrangements: Spouse/significant other Available Help at Discharge: Family, Available PRN/intermittently Type of Home: House Home Access: Stairs to enter [employer] of Steps: 4 Entrance Stairs-Rails: None Home Layout: Multi-level, Bed/bath upstairs Alternate Level Stairs-Number of Steps: 15 Alternate Level Stairs-Rails: Right Bathroom Shower/Tub: Multimedia programmer: Standard Home Equipment: Conservation officer, nature (2 wheels) 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 [employer]  Functional History: Prior Function Prior Level of Function : Independent/Modified Independent Mobility Comments: independent ADLs Comments: independent Functional Status:  Mobility: Bed Mobility Overal bed mobility: Needs Assistance Bed Mobility: Rolling, Sidelying to Sit Rolling: Min assist Sidelying to sit: Min assist, HOB elevated Sit to supine:  Min assist General bed mobility comments: cueing for technique, rolling towards R side with assist to manage L LE off EOB and assist trunk upright Transfers Overall transfer level: Needs assistance Equipment used: Rolling walker (2 wheels) Transfers: Sit to/from Stand Sit to Stand: Min assist, +2 physical assistance, +2 safety/equipment General transfer comment: to power up and steady from EOB, cueing for hand placement and safety/posture along with feet placement prior to transferring Ambulation/Gait Ambulation/Gait assistance: Mod assist, +2 physical assistance, +2 safety/equipment Gait Distance (Feet): 18 Feet Assistive device: Rolling walker (2 wheels) Gait Pattern/deviations: Step-through pattern, Decreased step length - left, Decreased dorsiflexion - left, Decreased stride length, Decreased stance time - left, Decreased weight shift to left, Knees buckling, Trunk flexed General Gait Details: Pt needing tactile cues and assistance to advance his L foot to step and to extend his L knee during stance due to noted knee buckling.  Faded cues as distance progressed with noted good carryover. Needs reminders to push up through RW and extend his trunk to stand upright. Up to modAx2 for L leg management and stability Gait velocity: decreased Gait velocity interpretation: <1.31 ft/sec, indicative of household ambulator   ADL: ADL Overall ADL's : Needs assistance/impaired Grooming: Set up, Sitting Upper Body Dressing : Minimal assistance, Sitting Lower Body Dressing: Maximal assistance, +2 for physical assistance, +2 for safety/equipment, Sit to/from stand Toilet Transfer: Moderate assistance, Minimal assistance, +2 for physical assistance, +2 for safety/equipment, Ambulation, Rolling walker (2 wheels) Toilet Transfer Details (indicate cue type and reason): simulated to recliner Functional mobility during ADLs: Minimal assistance, Moderate assistance, +2 for safety/equipment, +2 for physical  assistance, Cueing for sequencing General ADL Comments: pt limited by decreased functional use on L side   Cognition: Cognition Overall Cognitive Status: Within Functional Limits for tasks assessed Orientation Level: Oriented X4 Cognition Arousal/Alertness: Awake/alert Behavior During Therapy: WFL for tasks assessed/performed Overall Cognitive Status: Within Functional Limits for tasks assessed   Blood pressure 108/62, pulse 77, temperature 98.2 F (36.8 C), resp. rate 16, SpO2 96 %. Physical Exam Gen: no distress, normal appearing HEENT: oral mucosa pink and moist, NCAT Cardio: Reg rate Chest: normal effort, normal rate of breathing Abd: soft, non-distended Ext: no edema Psych: pleasant, normal affect Skin: intact Neuro: Alert and oriented x3 MSK: 1/5 left sided hip flexion, otherwise 0/5 in LLE. Ambulated with RW modA x2, 4/5 strength in right lower extremity   Lab Results Last 24 Hours       Results for orders placed or performed during the hospital encounter of 11/13/22 (from the past 24 hour(s))  Glucose, capillary     Status: Abnormal    Collection Time: 11/13/22  3:48 PM  Result Value Ref Range    Glucose-Capillary 150 (H) 70 - 99 mg/dL    Comment 1 Notify RN      Comment 2 Document in Chart         Imaging Results (Last 48 hours)  MR CERVICAL SPINE W WO CONTRAST   Result Date: 11/14/2022 CLINICAL DATA:  Initial evaluation for left-sided weakness status post cervical ACDF. EXAM: MRI CERVICAL SPINE WITHOUT AND WITH CONTRAST TECHNIQUE: Multiplanar and multiecho pulse sequences of the cervical spine, to include the craniocervical junction and cervicothoracic junction, were obtained without and with intravenous contrast. CONTRAST:  73m GADAVIST GADOBUTROL 1 MMOL/ML IV SOLN COMPARISON:  Comparison made with prior MRI from 10/24/2022. FINDINGS: Alignment: Straightening of the normal cervical lordosis. No listhesis. Vertebrae: Postoperative changes from prior ACDF at C3  through C6. Hardware appears appropriately positioned. Vertebral body height maintained with no visible acute or chronic fracture. Bone marrow signal intensity within normal limits. No worrisome osseous lesions. No abnormal marrow edema or enhancement. Cord: Patchy signal abnormality involving the central and left cord at the level of C5-6, concerning for contusion/injury (series 9, images 25-28). Cord is somewhat swollen at this level (series 5, image 9). No abnormal enhancement. Remainder of the visualized cord otherwise demonstrates a normal signal and morphology. No epidural hematoma or other collection. Posterior Fossa, vertebral arteries, paraspinal tissues: Visualized brain and posterior fossa within normal limits. Craniocervical junction normal. Diffuse prevertebral edema, felt to be within normal limits for normal expected postoperative changes. No visible loculated collections. Normal flow voids seen within the vertebral arteries bilaterally. Disc levels: C2-C3: Normal interspace. Mild left-sided facet hypertrophy. No canal or foraminal stenosis. C3-C4: Prior ACDF. No significant residual spinal stenosis. Foramina remain patent. C4-C5:  Prior ACDF. No significant residual spinal stenosis. Foramina remain patent. C5-C6: Prior ACDF. Residual mild flattening of the ventral thecal sac, asymmetric to the left, but no significant spinal stenosis. Foramina remain patent. C6-C7: Mild diffuse disc bulge with endplate and uncovertebral spurring. Flattening of the ventral thecal sac without significant spinal stenosis. Moderate bilateral C7 foraminal stenosis, slightly worse on the left. C7-T1: Normal interspace. Left greater than right facet hypertrophy. No spinal stenosis. Foramina remain patent. IMPRESSION: 1. Postoperative changes from prior ACDF at C3 through C6 without significant residual spinal stenosis. 2. Patchy signal abnormality involving the central and left cord at the level of C5-6, concerning for  contusion/injury. 3. Disc bulge with uncovertebral spurring at C6-7 with resultant moderate bilateral C7 foraminal stenosis. Electronically Signed   By: Jeannine Boga M.D.   On: 11/14/2022 00:54       Assessment/Plan: Diagnosis: Cervical myelopathy s/p C3-C6 ACDF Does the need for close, 24 hr/day medical supervision in concert with the patient's rehab needs make it unreasonable for this patient to be served in a less intensive setting? Yes Co-Morbidities requiring supervision/potential complications:  1) HTN: currently well controlled, continue to monitor TID 2) CKD: last creatinine reviewed and wnl 3) OA: can consider voltaren gel if having knee pain 4) GERD 5) Hyperglycemia Due to bladder management, bowel management, safety, skin/wound care, disease management, medication administration, pain management, and patient education, does the patient require 24 hr/day rehab nursing? Yes Does the patient require coordinated care of a physician, rehab nurse, therapy disciplines of PT, OT to address physical and functional deficits in the context of the above medical diagnosis(es)? Yes Addressing deficits in the following areas: balance, endurance, locomotion, strength, transferring, bowel/bladder control, bathing, dressing, feeding, grooming, toileting, and psychosocial support Can the patient actively participate in an intensive therapy program of at least 3 hrs of therapy per day at least 5 days per week? Yes The potential for patient to make measurable gains while on inpatient rehab is excellent Anticipated functional outcomes upon discharge from inpatient rehab are modified independent  with PT, modified independent with OT, independent with SLP. Estimated rehab length of stay to reach the above functional goals is: 10-14 days Anticipated discharge destination: Home Overall Rehab/Functional Prognosis: excellent   POST ACUTE RECOMMENDATIONS: This patient's condition is appropriate for  continued rehabilitative care in the following setting: CIR Patient has agreed to participate in recommended program. Yes Note that insurance prior authorization may be required for reimbursement for recommended care.       I have personally performed a face to face diagnostic evaluation of this patient. Additionally, I have examined the patient's medical record including any pertinent labs and radiographic images. If the physician assistant has documented in this note, I have reviewed and edited or otherwise concur with the physician assistant's documentation.   Thanks,   Izora Ribas, MD 11/14/2022          Routing History

## 2022-11-17 NOTE — Progress Notes (Signed)
PROGRESS NOTE   Subjective/Complaints:  No BM but feels like he could have one this am   Appreciate Neurosurgery note  ROS:  Pt denies SOB, abd pain, CP, N/V/ (+)C/D, and vision changes Except for HPI  Objective:   No results found. Recent Labs    11/16/22 0840  WBC 9.5  HGB 15.2  HCT 43.4  PLT 199   Recent Labs    11/16/22 0840  NA 135  K 3.7  CL 98  CO2 28  GLUCOSE 115*  BUN 19  CREATININE 1.23  CALCIUM 8.7*    Intake/Output Summary (Last 24 hours) at 11/17/2022 0729 Last data filed at 11/17/2022 0229 Gross per 24 hour  Intake 530 ml  Output 1800 ml  Net -1270 ml         Physical Exam: Vital Signs Blood pressure (!) 110/59, pulse 69, temperature (!) 97.5 F (36.4 C), temperature source Oral, resp. rate 18, height '6\' 1"'$  (1.854 m), weight 94.2 kg, SpO2 97 %.  General: No acute distress Mood and affect are appropriate Heart: Regular rate and rhythm no rubs murmurs or extra sounds Lungs: Clear to auscultation, breathing unlabored, no rales or wheezes Abdomen: Positive bowel sounds, soft nontender to palpation, mildly distended Extremities: No clubbing, cyanosis, or edema Skin: No evidence of breakdown, no evidence of rash  Musculoskeletal:     Cervical back: Neck supple.     Comments: RUE 5/5 in biceps, triceps, WE< grip and FA LUE- biceps 5-/5; triceps 4/5; WE 4/5; FA 0/5, FF and FE 0/5 RLE- 5/5 in HF, KE, KF DF and PF and EHL LLE- HF 2-/5; KE 4-/5; KF 2/5; DF 4-/5, PF 4/5; EHL 4-/5 - unchanged Skin:    General: Skin is warm and dry.     Comments: R handed IV Looks OK  Neurological:     Mental Status: He is alert and oriented to person, place, and time.     Comments: Sensation intact to light touch in all 4 extremities except L C8 distribution is reduced Tone is normal in UE and LE Assessment/Plan: 1. Functional deficits which require 3+ hours per day of interdisciplinary therapy in a  comprehensive inpatient rehab setting. Physiatrist is providing close team supervision and 24 hour management of active medical problems listed below. Physiatrist and rehab team continue to assess barriers to discharge/monitor patient progress toward functional and medical goals  Care Tool:  Bathing    Body parts bathed by patient: Left arm, Chest, Abdomen, Front perineal area, Right upper leg, Left upper leg, Face, Right lower leg   Body parts bathed by helper: Buttocks, Left lower leg, Right arm     Bathing assist Assist Level: Moderate Assistance - Patient 50 - 74%     Upper Body Dressing/Undressing Upper body dressing   What is the patient wearing?: Pull over shirt    Upper body assist Assist Level: Moderate Assistance - Patient 50 - 74%    Lower Body Dressing/Undressing Lower body dressing      What is the patient wearing?: Pants     Lower body assist Assist for lower body dressing: Maximal Assistance - Patient 25 - 49%  Toileting Toileting    Toileting assist Assist for toileting: Maximal Assistance - Patient 25 - 49%     Transfers Chair/bed transfer  Transfers assist     Chair/bed transfer assist level: Moderate Assistance - Patient 50 - 74%     Locomotion Ambulation   Ambulation assist      Assist level: Moderate Assistance - Patient 50 - 74% Assistive device: Walker-rolling Max distance: 140 ft   Walk 10 feet activity   Assist     Assist level: Moderate Assistance - Patient - 50 - 74% Assistive device: Walker-rolling   Walk 50 feet activity   Assist    Assist level: Moderate Assistance - Patient - 50 - 74% Assistive device: Walker-rolling    Walk 150 feet activity   Assist Walk 150 feet activity did not occur: Safety/medical concerns         Walk 10 feet on uneven surface  activity   Assist Walk 10 feet on uneven surfaces activity did not occur: Safety/medical concerns         Wheelchair     Assist Is the  patient using a wheelchair?: Yes (transport only on eval)      Wheelchair assist level: Dependent - Patient 0%      Wheelchair 50 feet with 2 turns activity    Assist        Assist Level: Dependent - Patient 0%   Wheelchair 150 feet activity     Assist      Assist Level: Dependent - Patient 0%   Blood pressure (!) 110/59, pulse 69, temperature (!) 97.5 F (36.4 C), temperature source Oral, resp. rate 18, height '6\' 1"'$  (1.854 m), weight 94.2 kg, SpO2 97 %.  Medical Problem List and Plan: 1. Functional deficits secondary to Cervical myelopathy with L sided weakness s/p C3-6 ACDF             -patient may  shower- cover incision- will see if therapy can use Estim once assessed. No cancer hx.              -ELOS/Goals: 7-10 days- mod I to supervision  Con't CIR PT and OT- first day of evaluations- asked pt to take something for pain prior to therapy 2.  Antithrombotics: -DVT/anticoagulation:  Mechanical: Sequential compression devices, below knee Bilateral lower extremities             Will see when can start Lovenox             -antiplatelet therapy: N/A 3. Pain Management: Hydrocodone and flexeril prn for pain  4. Mood/Behavior/Sleep: LCSW to follow for evaluation and support.              --Fexeril prn for muscle pain.              --gabapentin added BID to help LUE/LLE achy pain.    3/1- has Norco as needed for pain             -antipsychotic agents: N/A 5. Neuropsych/cognition: This patient is capable of making decisions on his own behalf. 6. Skin/Wound Care: Monitor wound for healing.  7. Fluids/Electrolytes/Nutrition: Monitor I/O. Check CMET in am.  8. Renal infarct//hx of CKD/HTN: Monitor BP TID--on metoprolol to keep SBP 90-100 range per wife             --was not taking Cozaar PTA.   3/1- BP well controlled- con't regimen 9. GERD: Continue PPI. 10. Labs: Wife reports no labs done pre-op.   --Recheck  CMET/CBC in am.  11. Constipation- LBM 3 days ago- not on  anything scheduled- will schedule bowel meds- if no results by tomorrow, will do Sorbitol.   3/1- will give Sorbitol if no BM by 5pm.       LOS: 2 days A FACE TO FACE EVALUATION WAS PERFORMED  Charlett Blake 11/17/2022, 7:29 AM

## 2022-11-18 MED ORDER — TAMSULOSIN HCL 0.4 MG PO CAPS
0.4000 mg | ORAL_CAPSULE | Freq: Every day | ORAL | Status: DC
  Administered 2022-11-18 – 2022-11-30 (×13): 0.4 mg via ORAL
  Filled 2022-11-18 (×13): qty 1

## 2022-11-18 NOTE — Progress Notes (Signed)
Occupational Therapy Session Note  Patient Details  Name: Johnny Robinson MRN: [id number] Date of Birth: Sep 17, 1966  Today's Date: 11/18/2022 OT Individual Time: 1100-1155 OT Individual Time Calculation (min): 55 min    Short Term Goals: Week 1:  OT Short Term Goal 1 (Week 1): Pt will complete 1/3 toileting steps with mod A for balance OT Short Term Goal 2 (Week 1): Pt will thread LB into clothing utilizing AE PRN with supervision OT Short Term Goal 3 (Week 1): Pt will complete sit > stand using LRAD with CGA in prep for ADL task OT Short Term Goal 4 (Week 1): Pt will complete toilet transfer with min A using LRAD  Skilled Therapeutic Interventions/Progress Updates:    OT intervention with focus on LUE functional grasp/release and tall kneeling. Amb 5' with RW at min A. Sit<>stand with CGA. SAEBO trialed for LUE finger extension/extension with positive results. High kneeling on mat to facilitate WBing through LLE/LUE. Pt with increased discomfort in Rt knee. Min A for getting into/out of tall kneeling. Pt returned to room. Placed SAEBO on LUE to facilitate finger flexion. Wife present to observe. SAEBO remained on LUE and OTA returned to remove later after lunch. Pt with trace finger flexion/extension.  Saebo Stim One 330 pulse width 35 Hz pulse rate On 8 sec/ off 8 sec Ramp up/ down 2 sec Symmetrical Biphasic wave form  Max intensity 150m at 500 Ohm load   No adverse reaction.  Therapy Documentation Precautions:  Precautions Precautions: Fall, Cervical Precaution Comments: reviewed cervical precautions Required Braces or Orthoses: Cervical Brace Cervical Brace: Soft collar Restrictions Weight Bearing Restrictions: No   Pain: Pain Assessment Pain Scale: 0-10 Pain Score: 0-No pain   Therapy/Group: Individual Therapy  [name]3/11/2022, 12:16 PM

## 2022-11-18 NOTE — Progress Notes (Signed)
Physical Therapy Session Note  Patient Details  Name: Johnny Robinson MRN: [id number] Date of Birth: 1965/12/10  Today's Date: 11/18/2022 PT Individual Time: 1347-1450 PT Individual Time Calculation (min): 63 min   Short Term Goals: Week 1:  PT Short Term Goal 1 (Week 1): Pt will ambulate 150 ft with min A PT Short Term Goal 2 (Week 1): Pt will ambulate 100 ft with no incidence of knee buckling PT Short Term Goal 3 (Week 1): Pt will perform standing activities with short bouts of UUE support  Skilled Therapeutic Interventions/Progress Updates: Pt presented in bed agreeable to therapy. Pt c/o mild pain did not rate, received pain meds at start of session. Session focused on balance, endurance, and gait. Performed stand pivot transfer to minA and RW. Pt transported to main gym for energy conservation. Performed stand pivot to high/low mat in same manner as prior. Participated in Sit to stand with RLE placed on 2in block for forced use of LLE 2 x 5. Participated in x 2 bouts of horseshoes standing with RW with use of RUE. Pt required facilitation at hips to encourage weight bearing on LLE. Pt also required intermittent verbal cues to maintain TKE in stance. Pt then ambulated ~13f with RW and minA, pt noted to ambulate with narrow BOS and intermittent toe catching. Pt took seated break and PTA placed toe cap on L foot. Pt was then able to ambulate an additional ~1542fwith light minA and improved sequencing. Pt with x 1 occurrence of significant inversion at ankle but pt was able to correct before taking step. After seated rest discussed some exercises that pt can perform in room in evenings. Pt was able to perform LAQ when leg slightly elevated due to friction, pt transferred to supine and performed AA heel slides, AA hip abd/add, and transferred to sitting EOB with minA and instructed in hamstring/quad co-contraction (squish the bug). Provided with HEP. Once completed performed ambulatory transfer to w/c and  pt transported back to room. In room performed ambulatory transfer to recliner with light minA and RW. Pt left in recliner at end of session with call bell within reach and friends/family present.  Access Code: [email or web address]: [email or web address] Date: 11/18/2022 Prepared by: RoKarlyne GreenspaneChalus  Exercises - Seated Long Arc Quad  - 1 x daily - 7 x weekly - 3 sets - 10 reps - Supine Ankle Pumps  - 1 x daily - 7 x weekly - 3 sets - 10 reps - Supine Heel Slide with Strap  - 1 x daily - 7 x weekly - 3 sets - 10 reps - Supine Hip Abduction  - 1 x daily - 7 x weekly - 3 sets - 10 reps     Therapy Documentation Precautions:  Precautions Precautions: Fall, Cervical Precaution Comments: reviewed cervical precautions Required Braces or Orthoses: Cervical Brace Cervical Brace: Soft collar Restrictions Weight Bearing Restrictions: No General:   Vital Signs: Therapy Vitals Temp: 99.3 F (37.4 C) Temp Source: Oral Pulse Rate: 93 Resp: 16 BP: (!) 107/58 Patient Position (if appropriate): Sitting Oxygen Therapy SpO2: 95 % O2 Device: Room Air Pain:   Mobility:   Locomotion :    Trunk/Postural Assessment :    Balance:   Exercises:   Other Treatments:      Therapy/Group: Individual Therapy  [name] Balthazar 11/18/2022, 3:47 PM

## 2022-11-18 NOTE — Progress Notes (Signed)
Occupational Therapy Session Note  Patient Details  Name: Johnny Robinson MRN: [id number] Date of Birth: November 18, 1965  Today's Date: 11/18/2022 OT Individual Time: 0700-0810 OT Individual Time Calculation (min): 70 min    Short Term Goals: Week 1:  OT Short Term Goal 1 (Week 1): Pt will complete 1/3 toileting steps with mod A for balance OT Short Term Goal 2 (Week 1): Pt will thread LB into clothing utilizing AE PRN with supervision OT Short Term Goal 3 (Week 1): Pt will complete sit > stand using LRAD with CGA in prep for ADL task OT Short Term Goal 4 (Week 1): Pt will complete toilet transfer with min A using LRAD  Skilled Therapeutic Interventions/Progress Updates:    Pt resting in recliner upon arrival. Pt declined bathiing/dressing this morning. OT intervention with focus on discharge planning, demonstration of shower transfer, bed mobility, standing balance, and sit<>stand without AD. OTA demonstrated shower transfer. Pt practiced sit<>supine on elevated surface with use of gait belt for LLE mgmt. Pt completed task at supervision level. Pt engaged in standing activity with Wii bowliing; emphasis on WB through LLE and LUE. Pt required rest breaks x 2. Pt practiced squats from elecated surface pushing through BLE. Pt returned to room and transferred to recliner. All needs within reach.   Therapy Documentation Precautions:  Precautions Precautions: Fall, Cervical Precaution Comments: reviewed cervical precautions Required Braces or Orthoses: Cervical Brace Cervical Brace: Soft collar Restrictions Weight Bearing Restrictions: No Pain: Pain Assessment Pain Scale: 0-10 Pain Score: 6  Pain Type: Acute pain Pain Location: Neck Pain Descriptors / Indicators: Aching Pain Onset: Gradual Pain Intervention(s): Meds admin prior to therapy   Therapy/Group: Individual Therapy  [name] 11/18/2022, 8:10 AM

## 2022-11-19 ENCOUNTER — Inpatient Hospital Stay (HOSPITAL_COMMUNITY): Payer: Commercial Managed Care - PPO

## 2022-11-19 LAB — URINALYSIS, W/ REFLEX TO CULTURE (INFECTION SUSPECTED)
Bacteria, UA: NONE SEEN
Bilirubin Urine: NEGATIVE
Glucose, UA: NEGATIVE mg/dL
Ketones, ur: 5 mg/dL — AB
Leukocytes,Ua: NEGATIVE
Nitrite: NEGATIVE
Protein, ur: NEGATIVE mg/dL
Specific Gravity, Urine: 1.014 (ref 1.005–1.030)
pH: 6 (ref 5.0–8.0)

## 2022-11-19 LAB — CBC WITH DIFFERENTIAL/PLATELET
Abs Immature Granulocytes: 0.09 10*3/uL — ABNORMAL HIGH (ref 0.00–0.07)
Basophils Absolute: 0 10*3/uL (ref 0.0–0.1)
Basophils Relative: 0 %
Eosinophils Absolute: 0.1 10*3/uL (ref 0.0–0.5)
Eosinophils Relative: 1 %
HCT: 44.2 % (ref 39.0–52.0)
Hemoglobin: 15.4 g/dL (ref 13.0–17.0)
Immature Granulocytes: 1 %
Lymphocytes Relative: 11 %
Lymphs Abs: 1.5 10*3/uL (ref 0.7–4.0)
MCH: 32.6 pg (ref 26.0–34.0)
MCHC: 34.8 g/dL (ref 30.0–36.0)
MCV: 93.6 fL (ref 80.0–100.0)
Monocytes Absolute: 1.9 10*3/uL — ABNORMAL HIGH (ref 0.1–1.0)
Monocytes Relative: 13 %
Neutro Abs: 10.7 10*3/uL — ABNORMAL HIGH (ref 1.7–7.7)
Neutrophils Relative %: 74 %
Platelets: 234 10*3/uL (ref 150–400)
RBC: 4.72 MIL/uL (ref 4.22–5.81)
RDW: 11.3 % — ABNORMAL LOW (ref 11.5–15.5)
WBC: 14.3 10*3/uL — ABNORMAL HIGH (ref 4.0–10.5)
nRBC: 0 % (ref 0.0–0.2)

## 2022-11-19 LAB — BASIC METABOLIC PANEL
Anion gap: 10 (ref 5–15)
BUN: 23 mg/dL — ABNORMAL HIGH (ref 6–20)
CO2: 25 mmol/L (ref 22–32)
Calcium: 8.7 mg/dL — ABNORMAL LOW (ref 8.9–10.3)
Chloride: 97 mmol/L — ABNORMAL LOW (ref 98–111)
Creatinine, Ser: 1.22 mg/dL (ref 0.61–1.24)
GFR, Estimated: >60 mL/min (ref >60)
Glucose, Bld: 141 mg/dL — ABNORMAL HIGH (ref 70–99)
Potassium: 3.9 mmol/L (ref 3.5–5.1)
Sodium: 132 mmol/L — ABNORMAL LOW (ref 135–145)

## 2022-11-19 MED ORDER — AMOXICILLIN-POT CLAVULANATE 875-125 MG PO TABS
1.0000 | ORAL_TABLET | Freq: Two times a day (BID) | ORAL | Status: AC
  Administered 2022-11-19 – 2022-11-24 (×10): 1 via ORAL
  Filled 2022-11-19 (×10): qty 1

## 2022-11-19 MED ORDER — BOOST / RESOURCE BREEZE PO LIQD CUSTOM
1.0000 | Freq: Three times a day (TID) | ORAL | Status: DC
  Administered 2022-11-19 – 2022-11-20 (×2): 1 via ORAL

## 2022-11-19 MED ORDER — POLYETHYLENE GLYCOL 3350 17 G PO PACK
32.0000 g | PACK | Freq: Once | ORAL | Status: AC
  Administered 2022-11-19: 32 g via ORAL
  Filled 2022-11-19: qty 2

## 2022-11-19 MED ORDER — METOCLOPRAMIDE HCL 5 MG PO TABS
5.0000 mg | ORAL_TABLET | Freq: Three times a day (TID) | ORAL | Status: DC
  Administered 2022-11-19 – 2022-11-22 (×12): 5 mg via ORAL
  Filled 2022-11-19 (×12): qty 1

## 2022-11-19 MED ORDER — AMOXICILLIN-POT CLAVULANATE 875-125 MG PO TABS: 1.0000 | ORAL_TABLET | Freq: Two times a day (BID) | ORAL | Status: DC

## 2022-11-19 NOTE — Progress Notes (Addendum)
PROGRESS NOTE   Subjective/Complaints:  Pt reports bladder finally working.  Bowels still an issue- had BM 2 days ago after 1/2 Mg citrate but no more Bms after 2nd 1/2 of Mg citrate.  Mainly eating yogurt and fruit/soft stuff since throat still real sore.   ROS:  Pt denies SOB, abd pain, CP, N/V/ (+)C/D, and vision changes  Except for HPI  Objective:   No results found. Recent Labs    11/16/22 0840  WBC 9.5  HGB 15.2  HCT 43.4  PLT 199   Recent Labs    11/16/22 0840  NA 135  K 3.7  CL 98  CO2 28  GLUCOSE 115*  BUN 19  CREATININE 1.23  CALCIUM 8.7*    Intake/Output Summary (Last 24 hours) at 11/19/2022 0824 Last data filed at 11/19/2022 0630 Gross per 24 hour  Intake 120 ml  Output 1450 ml  Net -1330 ml        Physical Exam: Vital Signs Blood pressure 111/67, pulse (!) 102, temperature 98.6 F (37 C), temperature source Oral, resp. rate 18, height '6\' 1"'$  (1.854 m), weight 94.2 kg, SpO2 92 %.   General: awake, alert, appropriate, sitting up in w/c with OT in gym; wearing soft collar; NAD HENT: conjugate gaze; oropharynx moist CV: regular rate and rhythm when not working with therapy; no JVD Pulmonary: CTA B/L; no W/R/R- good air movement GI: soft, but NT/moderate distension- hypoactive BS Psychiatric: appropriate- interactive Neurological: Ox3  Musculoskeletal: no real change in strength in LUE so far    Cervical back: Neck supple.     Comments: RUE 5/5 in biceps, triceps, WE< grip and FA LUE- biceps 5-/5; triceps 4/5; WE 4/5; FA 0/5, FF and FE 0/5 RLE- 5/5 in HF, KE, KF DF and PF and EHL LLE- HF 2-/5; KE 4-/5; KF 2/5; DF 4-/5, PF 4/5; EHL 4-/5 - unchanged Skin:    General: Skin is warm and dry.     Comments: R handed IV Looks OK  Neurological:     Mental Status: He is alert and oriented to person, place, and time.     Comments: Sensation intact to light touch in all 4 extremities except L C8  distribution is reduced Tone is normal in UE and LE Assessment/Plan: 1. Functional deficits which require 3+ hours per day of interdisciplinary therapy in a comprehensive inpatient rehab setting. Physiatrist is providing close team supervision and 24 hour management of active medical problems listed below. Physiatrist and rehab team continue to assess barriers to discharge/monitor patient progress toward functional and medical goals  Care Tool:  Bathing    Body parts bathed by patient: Left arm, Chest, Abdomen, Front perineal area, Right upper leg, Left upper leg, Face, Right lower leg   Body parts bathed by helper: Buttocks, Left lower leg, Right arm     Bathing assist Assist Level: Moderate Assistance - Patient 50 - 74%     Upper Body Dressing/Undressing Upper body dressing   What is the patient wearing?: Pull over shirt    Upper body assist Assist Level: Moderate Assistance - Patient 50 - 74%    Lower Body Dressing/Undressing Lower body dressing  What is the patient wearing?: Pants     Lower body assist Assist for lower body dressing: Maximal Assistance - Patient 25 - 49%     Toileting Toileting    Toileting assist Assist for toileting: Maximal Assistance - Patient 25 - 49%     Transfers Chair/bed transfer  Transfers assist     Chair/bed transfer assist level: Moderate Assistance - Patient 50 - 74%     Locomotion Ambulation   Ambulation assist      Assist level: Moderate Assistance - Patient 50 - 74% Assistive device: Walker-rolling Max distance: 140 ft   Walk 10 feet activity   Assist     Assist level: Moderate Assistance - Patient - 50 - 74% Assistive device: Walker-rolling   Walk 50 feet activity   Assist    Assist level: Moderate Assistance - Patient - 50 - 74% Assistive device: Walker-rolling    Walk 150 feet activity   Assist Walk 150 feet activity did not occur: Safety/medical concerns         Walk 10 feet on  uneven surface  activity   Assist Walk 10 feet on uneven surfaces activity did not occur: Safety/medical concerns         Wheelchair     Assist Is the patient using a wheelchair?: Yes (transport only on eval)      Wheelchair assist level: Dependent - Patient 0%      Wheelchair 50 feet with 2 turns activity    Assist        Assist Level: Dependent - Patient 0%   Wheelchair 150 feet activity     Assist      Assist Level: Dependent - Patient 0%   Blood pressure 111/67, pulse (!) 102, temperature 98.6 F (37 C), temperature source Oral, resp. rate 18, height '6\' 1"'$  (1.854 m), weight 94.2 kg, SpO2 92 %.  Medical Problem List and Plan: 1. Functional deficits secondary to Cervical myelopathy with L sided weakness s/p C3-6 ACDF             -patient may  shower- cover incision- will see if therapy can use Estim once assessed. No cancer hx.              -ELOS/Goals: 7-10 days- mod I to supervision  Con't CIR- PT and OT- using Estim/Saebo with LUE weakness- is getting results, but no carryover yet.   2.  Antithrombotics: -DVT/anticoagulation:  Mechanical: Sequential compression devices, below knee Bilateral lower extremities             Will see when can start Lovenox 3/4- Lovenox started Friday with OK from [facility]             -antiplatelet therapy: N/A 3. Pain Management: Hydrocodone and flexeril prn for pain   3/4- pain usually controlled- con't regimen- con't soft collar as well.  4. Mood/Behavior/Sleep: LCSW to follow for evaluation and support.              --Fexeril prn for muscle pain.              --gabapentin added BID to help LUE/LLE achy pain.               -antipsychotic agents: N/A 5. Neuropsych/cognition: This patient is capable of making decisions on his own behalf. 6. Skin/Wound Care: Monitor wound for healing.  7. Fluids/Electrolytes/Nutrition: Monitor I/O. Check CMET in am.  8. Renal infarct//hx of CKD/HTN: Monitor BP TID--on metoprolol to keep  SBP 90-100 range  per wife             --was not taking Cozaar PTA.   3/1- BP well controlled- con't regimen 9. GERD: Continue PPI. 10. Labs: Wife reports no labs done pre-op.   --Recheck CMET/CBC in am.   3/4- labs look great- except Albumin of 2.9-  11. Constipation- LBM 3 days ago- not on anything scheduled- will schedule bowel meds- if no results by tomorrow, will do Sorbitol.   3/1- will give Sorbitol if no BM by 5pm.   3/4- had Mg citrate this weekend- had 1 small BM, but no more even with finishing mg citrate yesterday- will check KUB for possible ileus-      I spent a total of  39 minutes on total care today- >50% coordination of care- due to  D/w OT about work with Rowe Robert- as well as d/w pt and nursing about KUB- and my concern about Ileus. F/U on KUB results.   LOS: 4 days A FACE TO FACE EVALUATION WAS PERFORMED  Aveyah Greenwood 11/19/2022, 8:24 AM

## 2022-11-19 NOTE — Progress Notes (Signed)
Occupational Therapy Session Note  Patient Details  Name: Johnny Robinson MRN: [id number] Date of Birth: Feb 13, 1966  Today's Date: 11/19/2022 OT Individual Time: 0700-0810 OT Individual Time Calculation (min): 70 min    Short Term Goals: Week 1:  OT Short Term Goal 1 (Week 1): Pt will complete 1/3 toileting steps with mod A for balance OT Short Term Goal 2 (Week 1): Pt will thread LB into clothing utilizing AE PRN with supervision OT Short Term Goal 3 (Week 1): Pt will complete sit > stand using LRAD with CGA in prep for ADL task OT Short Term Goal 4 (Week 1): Pt will complete toilet transfer with min A using LRAD  Skilled Therapeutic Interventions/Progress Updates:    Pt resting in recliner upon arrival and ready for therapy. OT intervention with focus on LUE WBing, walk-in shower transfers, standing balance, and LUE NMR to increase independence with BADLs. Standing activity at hi-lo table with WBing through LUE while reaching with RUE to place tokens on magnetic board. Focus on increased WB through [name]. Pt practiced stepping over 4" ledge to simulate shower transfer. Pt completed task with min A and raising onto toes of RLE to elevate LLE to clean ledge. Pt has 7" ledge at home. Will continue to practice. Pt engaged in sit<>stand from EOM with CGA. SAEBO placed on LUE and pt returned to room. OTA retrieved SAEBO when completed. Pt amb with RW from doorway to recliner with CGA. Pt remained in recliner with all needs within reach.   Therapy Documentation Precautions:  Precautions Precautions: Fall, Cervical Precaution Comments: reviewed cervical precautions Required Braces or Orthoses: Cervical Brace Cervical Brace: Soft collar Restrictions Weight Bearing Restrictions: No Pain: Pt denies pain this morning  Therapy/Group: Individual Therapy  [name] 11/19/2022, 8:11 AM

## 2022-11-19 NOTE — Progress Notes (Signed)
Occupational Therapy Session Note  Patient Details  Name: Johnny Robinson MRN: [id number] Date of Birth: 07-Jun-1966  Today's Date: 11/19/2022 OT Individual Time: [id number] Individual Time Calculation (min): 40 min    Short Term Goals: Week 1:  OT Short Term Goal 1 (Week 1): Pt will complete 1/3 toileting steps with mod A for balance OT Short Term Goal 2 (Week 1): Pt will thread LB into clothing utilizing AE PRN with supervision OT Short Term Goal 3 (Week 1): Pt will complete sit > stand using LRAD with CGA in prep for ADL task OT Short Term Goal 4 (Week 1): Pt will complete toilet transfer with min A using LRAD  Skilled Therapeutic Interventions/Progress Updates:    Patient agreeable to participate in OT session. Reports 0/10 pain level.   Patient participated in skilled OT session focusing on NM re-education of the LUE. Therapist facilitated finger flexion and extension with use of Saebo Stim One to provide active assist in order to improved functional use of left hand to while using it as a gross assist during BADL tasks.  Therapist facilitated LUE muscle activation utilizing muscle vibration in order to facilitate greater muscle movement and ability to utilize LUE to complete functional ADL tasks  Grasp/release training activity completed in sitting with gravity assisting with finger extension and flexion and maintaining wrist in neutral position. Saebo Stim One assisting with either finger flexion or extension. 15 small foam pieces used during activity. Pt moved each piece from low blue bench placed on the left side of w/c to table top while Saebo Stim provided assisted finger flexion. Pt then moved each foam piece from table top to low blue bench while Saebo Stim assisted with finger extension.  Pt provided with VC for proper form and technique in order to maximize available movement during task. Pt presents with trace thumb and index finger flexion after finger flexors were stimulated.    At end of session, Saebo Stim One remained on patient activating finger extensors and device was retrieved from patient's room after 45 minutes.   Saebo Stim One 330 pulse width 35 Hz pulse rate On 8 sec/ off 8 sec Ramp up/ down 2 sec Symmetrical Biphasic wave form  Max intensity 175m at 500 Ohm load   No adverse reactions.  Therapy Documentation Precautions:  Precautions Precautions: Fall, Cervical Precaution Comments: reviewed cervical precautions Required Braces or Orthoses: Cervical Brace Cervical Brace: Soft collar Restrictions Weight Bearing Restrictions: No  Therapy/Group: Individual Therapy  LAilene Ravel OTR/L,CBIS  Supplemental OT - [facility] and [facility] Secure Chat Preferred   11/19/2022, 6:55 AM

## 2022-11-19 NOTE — Care Management (Signed)
Inpatient Rehabilitation Center Individual Statement of Services  Patient Name:  Johnny Robinson  Date:  11/19/2022  Welcome to the [facility].  Our goal is to provide you with an individualized program based on your diagnosis and situation, designed to meet your specific needs.  With this comprehensive rehabilitation program, you will be expected to participate in at least 3 hours of rehabilitation therapies Monday-Friday, with modified therapy programming on the weekends.  Your rehabilitation program will include the following services:  Physical Therapy (PT), Occupational Therapy (OT), 24 hour per day rehabilitation nursing, Therapeutic Recreaction (TR), Psychology, Neuropsychology, Care Coordinator, Rehabilitation Medicine, [place], and Other  Weekly team conferences will be held on Tuesdays to discuss your progress.  Your Inpatient Rehabilitation Care Coordinator will talk with you frequently to get your input and to update you on team discussions.  Team conferences with you and your family in attendance may also be held.  Expected length of stay: 2.5-3 weeks  Overall anticipated outcome: Supervision  Depending on your progress and recovery, your program may change. Your Inpatient Rehabilitation Care Coordinator will coordinate services and will keep you informed of any changes. Your Inpatient Rehabilitation Care Coordinator's name and contact numbers are listed  below.  The following services may also be recommended but are not provided by the [facility] will be made to provide these services after discharge if needed.  Arrangements include referral to agencies that provide these services.  Your insurance has been verified to be:  [employer]   Your primary doctor is:  Fulton Reek  Pertinent information will be shared with your doctor and your insurance company.  Inpatient Rehabilitation Care Coordinator:  Cathleen Corti [phone] or [phone]  Information discussed with and copy given to patient by: Rana Snare, 11/19/2022, 9:25 AM

## 2022-11-19 NOTE — Progress Notes (Signed)
Physical Therapy Session Note  Patient Details  Name: Johnny Robinson MRN: [id number] Date of Birth: 1966-02-22  Today's Date: 11/19/2022 PT Individual Time: 1415-1445 PT Individual Time Calculation (min): 30 min   Short Term Goals: Week 1:  PT Short Term Goal 1 (Week 1): Pt will ambulate 150 ft with min A PT Short Term Goal 2 (Week 1): Pt will ambulate 100 ft with no incidence of knee buckling PT Short Term Goal 3 (Week 1): Pt will perform standing activities with short bouts of UUE support  Skilled Therapeutic Interventions/Progress Updates:      Therapy Documentation Precautions:  Precautions Precautions: Fall, Cervical Precaution Comments: reviewed cervical precautions Required Braces or Orthoses: Cervical Brace Cervical Brace: Soft collar Restrictions Weight Bearing Restrictions: No  Pt received seated in recliner at bedside, without reports of pain and agreeable to PT. Pt requests to focus on gait training and ambulated 280 ft x 2 with RW/left hand splint and L AFO with min verbal cues to increase hamstring activation to improve foot clearance. Pt requires mod A with L LE step taps to facilitate increased hamstring activation, pt responds well to verbal cue "kick" and tactile cueing. Pt transitioned to R LE step taps with B HR to facilitate increase left quad activation as pt presents with knee buckling. Pt left seated in recliner at bedside with all needs in reach and family present.     Therapy/Group: Individual Therapy  Verl Dicker 11/19/2022, 7:57 AM

## 2022-11-19 NOTE — Progress Notes (Signed)
Physical Therapy Session Note  Patient Details  Name: Johnny Robinson MRN: [id number] Date of Birth: 05-25-1966  Today's Date: 11/19/2022 PT Individual Time: 0900-1000 PT Individual Time Calculation (min): 60 min   Short Term Goals: Week 1:  PT Short Term Goal 1 (Week 1): Pt will ambulate 150 ft with min A PT Short Term Goal 2 (Week 1): Pt will ambulate 100 ft with no incidence of knee buckling PT Short Term Goal 3 (Week 1): Pt will perform standing activities with short bouts of UUE support  Skilled Therapeutic Interventions/Progress Updates:    Pt seated in recliner on arrival and agreeable to therapy. No complaint of pain. Session focused on gait training with lite gait for endurance and improved gait mechanics. ambulatory transfer to w/c with RW and min A. Pt transported to therapy gym for time management and energy conservation. Pt set up with lite gait harness and navigated on/off treadmill with RW and cueing +min A. Pt then performed 2 bouts of 200 ft at .7 mph and 1 bout of 400 ft at 0.6 mph with addition of L knee flexion/hip flexion assist band. Provided tapping facilitation at hamstring. Pt demoes improved foot clearance from previous sessions, but continued toe drag, limited by weak hip flexors and hamstring. When fatigued, pt demoes hip hiking compensation. Rest break spent discussing mechanics and providing verbal and visual demonstration. Pt returned to room and to bed with supervision to await x ray. Pt was left with all needs in reach and alarm active.    Therapy Documentation Precautions:  Precautions Precautions: Fall, Cervical Precaution Comments: reviewed cervical precautions Required Braces or Orthoses: Cervical Brace Cervical Brace: Soft collar Restrictions Weight Bearing Restrictions: No General:       Therapy/Group: Individual Therapy  Mickel Fuchs 11/19/2022, 11:04 AM

## 2022-11-19 NOTE — Progress Notes (Signed)
PA Olin Hauser made aware of pt chest XR results. No new orders at this time. Sheela Stack, LPN

## 2022-11-19 NOTE — Progress Notes (Signed)
Patient ID: Johnny Robinson, male   DOB: 1966/02/17, 57 y.o.   MRN: [id number]  0927- SW left message for pt wife [name] to inform on ELOS, and SW will follow-up with updates after team conference.  Loralee Pacas, MSW, [place] Office: [phone] Cell: [phone] Fax: [phone]

## 2022-11-19 NOTE — Progress Notes (Signed)
No new issues or problems.  Pain well-controlled.  Making progress with therapy.  Afebrile.  Vital signs are stable.  Awake and alert.  Oriented and appropriate.  Left upper extremity weakness with 4+/5 wrist extensors and brachioradialis.  4/5 triceps function.  Now with some trace grip and intrinsic function on the left which is new.  Left lower extremity strength continues to improve.  Left hip flexors still 2-3/5.  Left quad 4+.  Anterior tibialis 4/5.  Plantarflexion 4+/5.  Right lower extremity normal.  Wound clean and dry.  Overall progressing well.  Continue rehab efforts and therapy.  May remove brace.

## 2022-11-20 LAB — CBC WITH DIFFERENTIAL/PLATELET
Abs Immature Granulocytes: 0.04 10*3/uL (ref 0.00–0.07)
Basophils Absolute: 0 10*3/uL (ref 0.0–0.1)
Basophils Relative: 0 %
Eosinophils Absolute: 0.2 10*3/uL (ref 0.0–0.5)
Eosinophils Relative: 2 %
HCT: 40.6 % (ref 39.0–52.0)
Hemoglobin: 14.4 g/dL (ref 13.0–17.0)
Immature Granulocytes: 0 %
Lymphocytes Relative: 18 %
Lymphs Abs: 1.6 10*3/uL (ref 0.7–4.0)
MCH: 32.9 pg (ref 26.0–34.0)
MCHC: 35.5 g/dL (ref 30.0–36.0)
MCV: 92.7 fL (ref 80.0–100.0)
Monocytes Absolute: 1.3 10*3/uL — ABNORMAL HIGH (ref 0.1–1.0)
Monocytes Relative: 15 %
Neutro Abs: 5.8 10*3/uL (ref 1.7–7.7)
Neutrophils Relative %: 65 %
Platelets: 235 10*3/uL (ref 150–400)
RBC: 4.38 MIL/uL (ref 4.22–5.81)
RDW: 11.3 % — ABNORMAL LOW (ref 11.5–15.5)
WBC: 8.9 10*3/uL (ref 4.0–10.5)
nRBC: 0 % (ref 0.0–0.2)

## 2022-11-20 LAB — BASIC METABOLIC PANEL
Anion gap: 11 (ref 5–15)
BUN: 19 mg/dL (ref 6–20)
CO2: 27 mmol/L (ref 22–32)
Calcium: 8.7 mg/dL — ABNORMAL LOW (ref 8.9–10.3)
Chloride: 97 mmol/L — ABNORMAL LOW (ref 98–111)
Creatinine, Ser: 1.11 mg/dL (ref 0.61–1.24)
GFR, Estimated: >60 mL/min (ref >60)
Glucose, Bld: 115 mg/dL — ABNORMAL HIGH (ref 70–99)
Potassium: 3.9 mmol/L (ref 3.5–5.1)
Sodium: 135 mmol/L (ref 135–145)

## 2022-11-20 MED ORDER — HYDROCODONE-ACETAMINOPHEN 10-325 MG PO TABS: 1.0000 | ORAL_TABLET | ORAL | Status: DC | PRN

## 2022-11-20 MED ORDER — HYDROCODONE-ACETAMINOPHEN 10-325 MG PO TABS
1.0000 | ORAL_TABLET | ORAL | Status: DC | PRN
  Administered 2022-11-20 – 2022-11-21 (×3): 1 via ORAL
  Administered 2022-11-22: 2 via ORAL
  Administered 2022-11-22: 1 via ORAL
  Administered 2022-11-22 – 2022-11-27 (×20): 2 via ORAL
  Administered 2022-11-28 (×2): 1 via ORAL
  Administered 2022-11-28: 2 via ORAL
  Administered 2022-11-28: 1 via ORAL
  Administered 2022-11-29: 2 via ORAL
  Administered 2022-11-29 – 2022-11-30 (×4): 1 via ORAL
  Administered 2022-11-30: 2 via ORAL
  Filled 2022-11-20 (×6): qty 2
  Filled 2022-11-20: qty 1
  Filled 2022-11-20 (×8): qty 2
  Filled 2022-11-20 (×4): qty 1
  Filled 2022-11-20 (×2): qty 2
  Filled 2022-11-20: qty 1
  Filled 2022-11-20: qty 2
  Filled 2022-11-20: qty 1
  Filled 2022-11-20 (×7): qty 2
  Filled 2022-11-20 (×2): qty 1
  Filled 2022-11-20 (×3): qty 2

## 2022-11-20 NOTE — Progress Notes (Signed)
Physical Therapy Session Note  Patient Details  Name: Johnny Robinson MRN: [id number] Date of Birth: 1966-01-02  Today's Date: 11/20/2022 PT Individual Time: 1415-1500 PT Individual Time Calculation (min): 45 min   Short Term Goals: Week 1:  PT Short Term Goal 1 (Week 1): Pt will ambulate 150 ft with min A PT Short Term Goal 2 (Week 1): Pt will ambulate 100 ft with no incidence of knee buckling PT Short Term Goal 3 (Week 1): Pt will perform standing activities with short bouts of UUE support  Skilled Therapeutic Interventions/Progress Updates:      Therapy Documentation Precautions:  Precautions Precautions: Fall, Cervical Precaution Comments: reviewed cervical precautions Required Braces or Orthoses: Cervical Brace Cervical Brace: Soft collar Restrictions Weight Bearing Restrictions: No  Pt agreeable to PT session with emphasis on gait training, strength and coordination training. Pt with reports 5-6/10 neck pain, pre-medicated.   Pt ambulated >300 ft throughout session with RW/Left hand splint. PT removed AFO and pt demonstrates adequate foot clearance, mild knee hyperextension and no buckling episodes of knee buckling.   Pt participated in following exercises to facilitate L UE weight bearing to increase activation and LE coordination and strength training:   -half kneel <>tall kneel 3 x 10   -q-ped with with left hip extension 2 x 6   Pt transitioned to hamstring strengthening and in hook lying position performed knee flexion with swiss ball followed by resisted knee extension (modified leg press) 2 x 10.   Pt ambulated to room and left seated in recliner at bedside with all needs in reach, safety plan updated.   Therapy/Group: Individual Therapy  Verl Dicker Verl Dicker PT, DPT  11/20/2022, 7:54 AM

## 2022-11-20 NOTE — Patient Care Conference (Signed)
Inpatient RehabilitationTeam Conference and Plan of Care Update Date: 11/20/2022   Time: 11:19 AM   Patient Name: Johnny Robinson      Medical Record Number: [id number]  Date of Birth: [birth year] Sex: Male         Room/Bed: 4M12C/4M12C-01 Payor Info: Payor: [employer] EM[employer] / Plan: [place] [employer] PPO / Product Type: *No Product type* /    Admit Date/Time:  11/15/2022  4:54 PM  Primary Diagnosis:  HNP (herniated nucleus pulposus) with myelopathy, cervical  Hospital Problems: Principal Problem:   HNP (herniated nucleus pulposus) with myelopathy, cervical    Expected Discharge Date: Expected Discharge Date: 11/30/22  Team Members Present: Physician leading conference: Dr. Courtney Heys Social Worker Present: Loralee Pacas, [place] Nurse Present: Tacy Learn, RN PT Present: Ailene Rud, PT OT Present: Mariane Masters, OT PPS Coordinator present : Gunnar Fusi, SLP     Current Status/Progress Goal Weekly Team Focus  Bowel/Bladder   Continent of B&B; LBM 3/4, In and out Cath for pvr > 352m   Patient remains continent and B&B   Assess toileting Q shift and as needed    Swallow/Nutrition/ Hydration               ADL's   min A bathing; min A UB dressing; mod A LB dressing; CGA for transfers; trace LUE finger flexion/extension when palpated;   mod I overall   BADLs, LUE NMR (Saebo); transfers, education    Mobility   Supervision bed mobility, min A STS and SPT, min-mod gait 150 ft with RW, 400 ft with lite gait   supervision transfers CGA gait, min A stairs  gait mechanics, endurance, L hip flexor strength    Communication                Safety/Cognition/ Behavioral Observations               Pain   Rates pain 6 out 10, prn Norco 10-325 and flexeril as needed   Pain < or = 2   Assess pain q shift and as needed    Skin   Incision to neck covered with Steri Strips   Patient remain free infection and surgical incision continue to heal  Assess  skin q shift and as needed      Discharge Planning:  Pt will d/c to home with his wife who will provide intermittent support during the day since she will be working from home; support in the evening from their 55y.o. son. No concerns about 24/7 supervision. SW will confirm there are no barriers to discharge.   Team Discussion: Herniated nucleus pulposus with myelopathy, cervical. Continent B/B with urinary retention. Flomax started. Pain managed with prn medications. Incision to neck is CDI without drainage. Augmentin started for multifocal PNU.  Patient on target to meet rehab goals: yes, MinA bathing. MinA UB dressing. ModA LB dressing. CGA for transfers. Supervision bed mobility. MinA STS & SPT. Min/Mod A gait 150' with RW. 400' with lite gait.  *See Care Plan and progress notes for long and short-term goals.   Revisions to Treatment Plan:  Medication adjustments, monitor labs and test, bladder scans  Teaching Needs: Medications, safety, self care, skin care, gait/transfer training, etc  Current Barriers to Discharge: Decreased caregiver support, Home enviroment access/layout, Neurogenic bowel and bladder, Wound care, and Weight bearing restrictions  Possible Resolutions to Barriers: Family education, nursing education, order recommended DME     Medical Summary Current Status: multifocal pneumonia- occ caths  needed with Baldder scans >350cc- drainage looks good- on PO ABX for pneumonia-pain 6/10  Barriers to Discharge: Uncontrolled Pain;Incontinence;Neurogenic Bowel & Bladder;Self-care education;Weight bearing restrictions;Medical stability  Barriers to Discharge Comments: needs intermittent SUpervison/mod I Possible Resolutions to [employer] Focus: has pneumonia- on ABX- retaining urine- 7 inch shower ledge to get in; needs step to get in/out- CGA sit- stand- 280 ft x4 today- with LOB; estim for LUE/LLE- d/c 3/15   Continued Need for Acute Rehabilitation Level of Care: The  patient requires daily medical management by a physician with specialized training in physical medicine and rehabilitation for the following reasons: Direction of a multidisciplinary physical rehabilitation program to maximize functional independence : Yes Medical management of patient stability for increased activity during participation in an intensive rehabilitation regime.: Yes Analysis of laboratory values and/or radiology reports with any subsequent need for medication adjustment and/or medical intervention. : Yes   I attest that I was present, lead the team conference, and concur with the assessment and plan of the team.   Ernest Pine 11/20/2022, 2:47 PM

## 2022-11-20 NOTE — Progress Notes (Signed)
Occupational Therapy Session Note  Patient Details  Name: Johnny Robinson MRN: [id number] Date of Birth: December 22, 1965  Today's Date: 11/20/2022 OT Individual Time: 1300-1343 OT Individual Time Calculation (min): 43 min    Short Term Goals: Week 1:  OT Short Term Goal 1 (Week 1): Pt will complete 1/3 toileting steps with mod A for balance OT Short Term Goal 2 (Week 1): Pt will thread LB into clothing utilizing AE PRN with supervision OT Short Term Goal 3 (Week 1): Pt will complete sit > stand using LRAD with CGA in prep for ADL task OT Short Term Goal 4 (Week 1): Pt will complete toilet transfer with min A using LRAD  Skilled Therapeutic Interventions/Progress Updates:    Pt received in recliner with no pain reported  \Neuromuscular Reeducation Focus of session on functional mobility with RW for RW and NMR of LUE with saebo stim on wrist flexors to facilitat grasp. Pt uses a variety of objects for grasp/release as prep for feeding/drinking: foam block, cones, cups, wash cloth. Facilitated gross grasp/release, pro/supination of cup to pour out water as well as squeezing/ringing out washclot with mod overpressure.  Saebo Stim One--wrist flexors- good contraction skin in tact after 15 min stim unattended 330 pulse width 35 Hz pulse rate On 8 sec/ off 8 sec Ramp up/ down 2 sec Symmetrical Biphasic wave form  Max intensity 162m at 500 Ohm load   Pt left at end of session in recliner with call light in reach and all needs met   Therapy Documentation Precautions:  Precautions Precautions: Fall, Cervical Precaution Comments: reviewed cervical precautions Required Braces or Orthoses: Cervical Brace Cervical Brace: Soft collar Restrictions Weight Bearing Restrictions: No General:   Therapy/Group: Individual Therapy  STonny Branch3/01/2023, 6:59 AM

## 2022-11-20 NOTE — Progress Notes (Signed)
Educate pt on incentive spirometer and flutter valve. Pt verbalizes understanding.  Sheela Stack, LPN

## 2022-11-20 NOTE — Progress Notes (Signed)
PROGRESS NOTE   Subjective/Complaints:  Throat still sore, but improving.   Had 2 Bms yesterday -finally feels cleaned out.  Waiting for OT this AM.  Feels like doesn't have movement in L hand/fingers yet, except with Saebo.  Allowed to stop using brace- per Dr Annette Stable.     ROS:  Pt denies SOB, abd pain, CP, N/V/C/D, and vision changes   Except for HPI  Objective:   DG Chest 1 View  Result Date: 11/19/2022 CLINICAL DATA:  Cough. EXAM: CHEST  1 VIEW COMPARISON:  05/30/2015. FINDINGS: Patchy opacities in the lower lungs, suspicious for multifocal pneumonia. Linear atelectasis or scarring in the left lung base. No pleural effusion or pneumothorax. Normal heart size and mediastinal contours. IMPRESSION: Patchy opacities in the lower lungs, suspicious for multifocal pneumonia. Electronically Signed   By: Emmit Alexanders M.D.   On: 11/19/2022 14:55   DG Abd 1 View  Result Date: 11/19/2022 CLINICAL DATA:  Constipation EXAM: ABDOMEN - 1 VIEW COMPARISON:  07/26/2020 FINDINGS: Mild gaseous distension of the colon. No abnormally dilated loops of small bowel. No gross free intraperitoneal air. No radio-opaque calculi or other significant radiographic abnormality are seen. IMPRESSION: Nonobstructive bowel gas pattern with mild gaseous distension of the colon. Electronically Signed   By: Davina Poke D.O.   On: 11/19/2022 10:41   Recent Labs    11/19/22 0823 11/20/22 0723  WBC 14.3* 8.9  HGB 15.4 14.4  HCT 44.2 40.6  PLT 234 235   Recent Labs    11/19/22 0823 11/20/22 0723  NA 132* 135  K 3.9 3.9  CL 97* 97*  CO2 25 27  GLUCOSE 141* 115*  BUN 23* 19  CREATININE 1.22 1.11  CALCIUM 8.7* 8.7*    Intake/Output Summary (Last 24 hours) at 11/20/2022 0842 Last data filed at 11/20/2022 0010 Gross per 24 hour  Intake 1500 ml  Output 675 ml  Net 825 ml        Physical Exam: Vital Signs Blood pressure 109/73, pulse 86,  temperature 98.1 F (36.7 C), temperature source Oral, resp. rate 18, height '6\' 1"'$  (1.854 m), weight 94.2 kg, SpO2 95 %.    General: awake, alert, appropriate, sitting in bedside chair, waiting for OT; not wearing soft collar; NAD HENT: conjugate gaze; oropharynx moist CV: regular rate and rhythm; no JVD Pulmonary: sounds like decreased at bases; but no W/R/R GI: soft, NT, ND, (+)BS- more normoactive Psychiatric: appropriate- interactive Neurological: Ox3 Extremities; trace swelling LUE- ring was taken off 1 week ago, but still see mark  Musculoskeletal:  Wasn't able to get 1/5 in L grip or FA on my exam L HF 2/5; KE 4-/5; and DF/PF 4-/5    Cervical back: Neck supple.     Comments: RUE 5/5 in biceps, triceps, WE< grip and FA LUE- biceps 5-/5; triceps 4/5; WE 4/5; FA 0/5, FF and FE 0/5 RLE- 5/5 in HF, KE, KF DF and PF and EHL LLE- HF 2-/5; KE 4-/5; KF 2/5; DF 4-/5, PF 4/5; EHL 4-/5 - unchanged Skin:    General: Skin is warm and dry.     Comments: R handed IV Looks OK  Neurological:  Mental Status: He is alert and oriented to person, place, and time.     Comments: Sensation intact to light touch in all 4 extremities except L C8 distribution is reduced Tone is normal in UE and LE Assessment/Plan: 1. Functional deficits which require 3+ hours per day of interdisciplinary therapy in a comprehensive inpatient rehab setting. Physiatrist is providing close team supervision and 24 hour management of active medical problems listed below. Physiatrist and rehab team continue to assess barriers to discharge/monitor patient progress toward functional and medical goals  Care Tool:  Bathing    Body parts bathed by patient: Left arm, Chest, Abdomen, Front perineal area, Right upper leg, Left upper leg, Face, Right lower leg   Body parts bathed by helper: Buttocks, Left lower leg, Right arm     Bathing assist Assist Level: Moderate Assistance - Patient 50 - 74%     Upper Body  Dressing/Undressing Upper body dressing   What is the patient wearing?: Pull over shirt    Upper body assist Assist Level: Moderate Assistance - Patient 50 - 74%    Lower Body Dressing/Undressing Lower body dressing      What is the patient wearing?: Pants     Lower body assist Assist for lower body dressing: Maximal Assistance - Patient 25 - 49%     Toileting Toileting    Toileting assist Assist for toileting: Maximal Assistance - Patient 25 - 49%     Transfers Chair/bed transfer  Transfers assist     Chair/bed transfer assist level: Moderate Assistance - Patient 50 - 74%     Locomotion Ambulation   Ambulation assist      Assist level: Moderate Assistance - Patient 50 - 74% Assistive device: Walker-rolling Max distance: 140 ft   Walk 10 feet activity   Assist     Assist level: Moderate Assistance - Patient - 50 - 74% Assistive device: Walker-rolling   Walk 50 feet activity   Assist    Assist level: Moderate Assistance - Patient - 50 - 74% Assistive device: Walker-rolling    Walk 150 feet activity   Assist Walk 150 feet activity did not occur: Safety/medical concerns         Walk 10 feet on uneven surface  activity   Assist Walk 10 feet on uneven surfaces activity did not occur: Safety/medical concerns         Wheelchair     Assist Is the patient using a wheelchair?: Yes (transport only on eval) Type of Wheelchair: Manual    Wheelchair assist level: Dependent - Patient 0%      Wheelchair 50 feet with 2 turns activity    Assist        Assist Level: Dependent - Patient 0%   Wheelchair 150 feet activity     Assist      Assist Level: Dependent - Patient 0%   Blood pressure 109/73, pulse 86, temperature 98.1 F (36.7 C), temperature source Oral, resp. rate 18, height '6\' 1"'$  (1.854 m), weight 94.2 kg, SpO2 95 %.  Medical Problem List and Plan: 1. Functional deficits secondary to Cervical myelopathy with L  sided weakness s/p C3-6 ACDF             -patient may  shower- cover incision- will see if therapy can use Estim once assessed. No cancer hx.              -ELOS/Goals: 7-10 days- mod I to supervision  Con't CIR PT and OT- will ask  PT to also try estim for LLE if possible- doing for LUE- no carryover yet- team conference today to determine length of stay 2.  Antithrombotics: -DVT/anticoagulation:  Mechanical: Sequential compression devices, below knee Bilateral lower extremities             Will see when can start Lovenox 3/4- Lovenox started Friday with OK from [facility]             -antiplatelet therapy: N/A 3. Pain Management: Hydrocodone and flexeril prn for pain   3/4- pain usually controlled- con't regimen- con't soft collar as well.   3/5- --Fexeril prn for muscle pain.              --gabapentin added BID to help LUE/LLE achy pain.  Pain well controlled- can stop use of soft collar per NSU 4. Mood/Behavior/Sleep: LCSW to follow for evaluation and support.              3/5- mood in good spirits             -antipsychotic agents: N/A 5. Neuropsych/cognition: This patient is capable of making decisions on his own behalf. 6. Skin/Wound Care: Monitor wound for healing.  7. Fluids/Electrolytes/Nutrition: Monitor I/O. Check CMET in am.  8. Renal infarct//hx of CKD/HTN: Monitor BP TID--on metoprolol to keep SBP 90-100 range per wife             --was not taking Cozaar PTA.   3/1- BP well controlled- con't regimen 9. GERD: Continue PPI. 10. Labs: Wife reports no labs done pre-op.   --Recheck CMET/CBC in am.   3/4- labs look great- except Albumin of 2.9-  11. Constipation- LBM 3 days ago- not on anything scheduled- will schedule bowel meds- if no results by tomorrow, will do Sorbitol.   3/1- will give Sorbitol if no BM by 5pm.   3/4- had Mg citrate this weekend- had 1 small BM, but no more even with finishing mg citrate yesterday- will check KUB for possible ileus-   3/5- KUB showed small amount  of stool- no ileus- did finally have good BM x2 and feels cleaned out.  12. Multifocal pneumonia  3/5- started on Augmentin per PA- which I agree with- feeling good today   -WBC was 14.5- down to 8.9 after augmentin- will con't   I spent a total of 40   minutes on total care today- >50% coordination of care- due to  Due to d/w PA about CXR results as well as team conference to determine length of stay  LOS: 5 days A FACE TO FACE EVALUATION WAS PERFORMED  Karlita Lichtman 11/20/2022, 8:42 AM

## 2022-11-20 NOTE — Progress Notes (Signed)
Occupational Therapy Session Note  Patient Details  Name: Johnny Robinson MRN: [id number] Date of Birth: 1966/04/30  Today's Date: 11/20/2022 OT Individual Time: [id number] OT Individual Time Calculation (min): 57 min    Short Term Goals: Week 1:  OT Short Term Goal 1 (Week 1): Pt will complete 1/3 toileting steps with mod A for balance OT Short Term Goal 2 (Week 1): Pt will thread LB into clothing utilizing AE PRN with supervision OT Short Term Goal 3 (Week 1): Pt will complete sit > stand using LRAD with CGA in prep for ADL task OT Short Term Goal 4 (Week 1): Pt will complete toilet transfer with min A using LRAD  Skilled Therapeutic Interventions/Progress Updates:    Pt greeted sitting in recliner and agreeable to OT treatment session. Pt declined need for BADL tasks or to go to the  bathroom. OT assist for donning shoes and L AFO. Pt then ambulated to the therapy gym with CGA, but 2 episodes of LOB requiring min/mod A to correct ( L toe caught). L UE NMR with weight pushes through ball along table. OT placed e-stim on wrist flexors and completed functional cup stacking activity using e-stim to assist with grasp. Sit<>stands with oblique twist and chest press while holding onto grippy ball. Pt needed CGA for balance. Pt ambulated back to room with improved clearance of L toe. OT placed SAEBO e-stim. SAEBO left on for 60 minutes. OT returned to remove SAEBO with skin intact and no adverse reactions.  Saebo Stim One 330 pulse width 35 Hz pulse rate On 8 sec/ off 8 sec Ramp up/ down 2 sec Symmetrical Biphasic wave form  Max intensity 129m at 500 Ohm load  OT returned to remove SAEBO with no adverse reactions.  Therapy Documentation Precautions:  Precautions Precautions: Fall, Cervical Precaution Comments: reviewed cervical precautions Required Braces or Orthoses: Cervical Brace Cervical Brace: Soft collar Restrictions Weight Bearing Restrictions: No Pain: Pain Assessment Pain Scale:  0-10 Pain Score: 0-No pain   Therapy/Group: Individual Therapy  EValma Cava3/01/2023, 9:44 AM

## 2022-11-20 NOTE — Progress Notes (Signed)
Patient ID: Johnny Robinson, male   DOB: 11-Apr-1966, 57 y.o.   MRN: [id number]  SW met with pt in room to provide updates from team conference on gains made, and d/c date 3/15. Pt aware SW will follow-up with his wife.   1429-SW spoke with pt wife to provide above updates. Fam edu scheduled for Tuesday (3/12) 9am-12pm.   Loralee Pacas, MSW, [place] Office: [phone] Cell: [phone] Fax: [phone]

## 2022-11-20 NOTE — Progress Notes (Signed)
Physical Therapy Session Note  Patient Details  Name: Johnny Robinson MRN: [id number] Date of Birth: [birth year]  Today's Date: 11/20/2022 PT Individual Time: 0915-1020 PT Individual Time Calculation (min): 65 min   Short Term Goals: Week 1:  PT Short Term Goal 1 (Week 1): Pt will ambulate 150 ft with min A PT Short Term Goal 2 (Week 1): Pt will ambulate 100 ft with no incidence of knee buckling PT Short Term Goal 3 (Week 1): Pt will perform standing activities with short bouts of UUE support  Skilled Therapeutic Interventions/Progress Updates:    Pt received in recliner and agreeable to therapy.  No complaint of pain.   Transfers: Sit to stand throughout with supervision with RW.  NMR: Prior to gait training, pt participated in seated marching and heel slides for improved muscle activation. Educated pt on doing these independently for increased repetition. Pt also participated in standing marching and step taps. Improved hip flexion activation with familiar task of placing foot on step.  Gait training: Pt ambulated with RW and min fading to mod A with fatigue. VC and visual demonstration of mechanics during rest breaks. Pt ambulated 2 x ~100 ft at start and end of session and 4 x ~280 ft during session. Utilized music for improved cadence. Cueing for increased hip flexion for foot clearance, longer L stance time, decreased UE reliance, and improved cadence. Pt with compensation patterns arising in 3rd and 4th bouts, including hip hike and R PF for foot clearance. Occ knee hyperextension, which improved with cues.   Stair training: Pt navigated 6" steps x 12 with mod A and 2 hand rails using step to pattern! Pt then participated in targeted strength training, including step ups on LLE with R step tap on 2nd step, facilitated alternating step taps. Cues for upright posture and increased glute engagement while standing on LLE.  Pt returned to room after session, ending early d/t extreme fatigue. Pt  remained in recliner with his wife present and was left with all needs in reach.   Therapy Documentation Precautions:  Precautions Precautions: Fall, Cervical Precaution Comments: reviewed cervical precautions Required Braces or Orthoses: Cervical Brace Cervical Brace: Soft collar Restrictions Weight Bearing Restrictions: No General: PT Amount of Missed Time (min): 10 Minutes PT Missed Treatment Reason: Patient fatigue     Therapy/Group: Individual Therapy  Mickel Fuchs 11/20/2022, 10:36 AM

## 2022-11-21 DIAGNOSIS — G894 Chronic pain syndrome: Secondary | ICD-10-CM

## 2022-11-21 NOTE — Progress Notes (Signed)
Occupational Therapy Session Note  Patient Details  Name: Johnny Robinson MRN: [id number] Date of Birth: 10-16-1965  Today's Date: 11/21/2022 OT Individual Time: 1345-1445 OT Individual Time Calculation (min): 60 min    Short Term Goals: Week 1:  OT Short Term Goal 1 (Week 1): Pt will complete 1/3 toileting steps with mod A for balance OT Short Term Goal 2 (Week 1): Pt will thread LB into clothing utilizing AE PRN with supervision OT Short Term Goal 3 (Week 1): Pt will complete sit > stand using LRAD with CGA in prep for ADL task OT Short Term Goal 4 (Week 1): Pt will complete toilet transfer with min A using LRAD  Skilled Therapeutic Interventions/Progress Updates:   Pt seen for skilled OT session no pain or concerns identified. Pt up in recliner and was able to fasten gait belt and use SBQC for sit to stand and amb to and from middle gym with CGA. Min cues for foot strike and strategies for safety with L LE. Standing then seated at table for OT training on weight bearing strategies into L UE. OT reinforced on importance and effectiveness for recovery. Pt able to teach back. OT applied SaeboStim One to L finger extensors with some wrist activation for 10 minutes then change to L wrist/finger flexors. Pt educated in use of gravity eliminated planes for increased movement and carryover into UE HEP. Pt also set up with foam lego blocks in upright position and was able with stim in place to gross grasp then release 15 blocks with synchronous movement pattern based on fire/pause of device. Limited active distal ranges in wrist and fingers/thumb without device against gravity. Finished session bedside with education on importance of L resting hand splint for neuro re-ed rationale outside ofjoint protection and tone mngt. Pt agreeable to nightly use. Left pt with all safety measures and needs in place.   Pt response and parameters to SaeboStim One:   no adverse skin reactions.  330 pulse width 35 Hz pulse  rate On 8 sec/ off 8 sec Ramp up/ down 2 sec Symmetrical Biphasic wave form  Max intensity 135m at 500 Ohm load  Therapy Documentation Precautions:  Precautions Precautions: Fall, Cervical Precaution Comments: reviewed cervical precautions Required Braces or Orthoses: Cervical Brace Cervical Brace: Soft collar Restrictions Weight Bearing Restrictions: No    Therapy/Group: Individual Therapy  [name]3/02/2023, 7:55 AM

## 2022-11-21 NOTE — Progress Notes (Signed)
Physical Therapy Session Note  Patient Details  Name: Johnny Robinson MRN: [id number] Date of Birth: 1966/04/02  Today's Date: 11/21/2022 PT Individual Time: 1430-1525 PT Individual Time Calculation (min): 55 min   Short Term Goals: Week 1:  PT Short Term Goal 1 (Week 1): Pt will ambulate 150 ft with min A PT Short Term Goal 2 (Week 1): Pt will ambulate 100 ft with no incidence of knee buckling PT Short Term Goal 3 (Week 1): Pt will perform standing activities with short bouts of UUE support  Skilled Therapeutic Interventions/Progress Updates:     Pt received seated in WC and agrees to therapy. No complaint of pain. Pt performs sit to stand from [facility] with CGA and cues for initiation. Pt ambulates x100' to gym with small based quad cane in R upper extremity and CGA, with cues for posture and engagement of L hip abductors. Pt takes seated rest break. Pt transitions to supine and performs supine therex for strengthening of L lower extremity. Pt completes 2x10 SLRs, hip abduction, and heel slides. Hip abduction requires AAROM for full ROM with PT providing cues for optimal body mechanics and performance for desired muscle activation. Pt performs supine to sit with cues for positioning. Pt ambulates to parallel bars without AD with CGA. Pt completes sidestepping facing mirror with both hands on parallel bar for support. PT provides cues for neutral hip rotation and posture. Pt performs x10 heel raises and x10 toe raises. Pt then ambulates back to room with small based quad cane and same cues. Pt left seated in recliner with all needs within reach.   Therapy Documentation Precautions:  Precautions Precautions: Fall, Cervical Precaution Comments: reviewed cervical precautions Required Braces or Orthoses: Cervical Brace Cervical Brace: Soft collar Restrictions Weight Bearing Restrictions: No    Therapy/Group: Individual Therapy  Breck Coons, PT, DPT 11/21/2022, 4:52 PM

## 2022-11-21 NOTE — Progress Notes (Signed)
Physical Therapy Session Note Patient Details  Name: Johnny Robinson MRN: [id number] Date of Birth: 1965/11/01  Today's Date: 11/21/2022 PT Individual Time: 1020-1105 PT Individual Time Calculation (min): 45 min   Short Term Goals: Week 1:  PT Short Term Goal 1 (Week 1): Pt will ambulate 150 ft with min A PT Short Term Goal 2 (Week 1): Pt will ambulate 100 ft with no incidence of knee buckling PT Short Term Goal 3 (Week 1): Pt will perform standing activities with short bouts of UUE support  Skilled Therapeutic Interventions/Progress Updates: Pt presented in recliner with wife present agreeable to therapy. Pt denies pain at rest, no pain behaviors noted during session. Session focused on progression of gait and balance. Pt ambulated to main gym with RW and CGA. Pt noted to have improved DF and L foot clearance as compared to when previously seen by this therapist. Discussed improvements in balance and gait and pt indicated that during previous PT session noted possible change in AD. Pt participated in Golden balance assessment with as score of 44/56 with raw score as noted below. Pt with most deficits when performing activities with narrow or single UE support. When performing taps to 6in block moderately increased ankle instability noted in LLE. Pt provided with [facility] and pt was able to ambulate approx 11f around gym with CGA and with only x 1 episode of L toe catching which pt was able to self correct with CGA from PTA. After brief rest pt ambulated to [employer] and participated in several bouts of static LOS. Pt first using BUE while being educated on task then pt was able to perform x 3 bouts without UE support. Pt scored 33%, 44%, and 56% respectively with improved ankle strategy noted. Pt then participated in "catch" app on [employer] with random setting x 3 min for more spontaneous movement. [facility] without any toe catching. Pt returned to recliner at end of session and  left with call bell within reach and wife present.      Therapy Documentation Precautions:  Precautions Precautions: Fall, Cervical Precaution Comments: reviewed cervical precautions Required Braces or Orthoses: Cervical Brace Cervical Brace: Soft collar Restrictions Weight Bearing Restrictions: No General:   Vital Signs: Therapy Vitals Temp: 98.2 F (36.8 C) Temp Source: Oral Pulse Rate: 83 Resp: 17 BP: 120/70 Patient Position (if appropriate): Sitting Oxygen Therapy SpO2: 96 % O2 Device: Room Air Pain:   Mobility:   Locomotion :    Trunk/Postural Assessment :    Balance: Standardized Balance Assessment Standardized Balance Assessment: BOceanographerTest Berg Balance Test Sit to Stand: Able to stand without using hands and stabilize independently Standing Unsupported: Able to stand safely 2 minutes Sitting with Back Unsupported but Feet Supported on Floor or Stool: Able to sit safely and securely 2 minutes Stand to Sit: Sits safely with minimal use of hands Transfers: Able to transfer safely, minor use of hands Standing Unsupported with Eyes Closed: Able to stand 10 seconds safely Standing Ubsupported with Feet Together: Able to place feet together independently and stand 1 minute safely From Standing, Reach Forward with Outstretched Arm: Can reach forward >12 cm safely (5") From Standing Position, Pick up Object from Floor: Able to pick up shoe safely and easily From Standing Position, Turn to Look Behind Over each Shoulder: Turn sideways only but maintains balance Turn 360 Degrees: Able to turn 360 degrees safely but slowly Standing Unsupported, Alternately Place Feet on Step/Stool: Able  to stand independently and complete 8 steps >20 seconds Standing Unsupported, One Foot in Front: Needs help to step but can hold 15 seconds Standing on One Leg: Tries to lift leg/unable to hold 3 seconds but remains standing independently Total Score: 44 Exercises:   Other  Treatments:      Therapy/Group: Individual Therapy  Wayland Baik 11/21/2022, 2:49 PM

## 2022-11-21 NOTE — Consult Note (Signed)
Neuropsychological Consultation Comprehensive Inpatient Rehab   Patient:   Johnny Robinson   DOB:   06-26-1966  MR Number:  [id number]  Location:  [facility] [address] CENTER [address] [employer] [id number] [place] [address] Dept: [place]: [phone]           Date of Service:   11/20/2022  Start Time:   4 PM End Time:   5 PM  Provider/Observer:  Ilean Skill, Psy.D.       Clinical Neuropsychologist       Billing Code/Service: [phone]  Reason for Service:    Johnny Robinson is a 57 year old male referred for neuropsychological consultation with patient having extended hospital stay and current admission onto the comprehensive rehabilitation unit following cervical decompression surgery on 11/13/2022 and developing profound weakness for left upper extremity and left lower extremity functioning.  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  Patient underwent cervical decompression at the outpatient surgical center in 11/13/2022 and postoperatively was found to have profound weakness for left-sided motor functioning.  There was some improvements initially overnight but continued to have left-sided weakness.  MRI spine done which showed postoperative changes from ACDF between C3-C6 and patchy signal abnormalities involving the central cord and left cord at C5/C6 concerning for contusion/injury.  Patient has been improving with intensive inpatient therapies but continues to have motor weakness is on the left side as well as significant sensory changes that are affecting his functional capacity.  The patient was oriented x 4 with good mental status and describes stable mood status as well.  Patient  reports that this has been stressful to him but he has had numerous medical events through the years including past renal infarction and subdural hematoma.  The patient reports that he is seeing improvements particularly for motor strength but continues with sensory disturbance and paresthesias on left side.  HPI for the current admission:    HPI: Johnny Robinson is a 57 year old R handed male with 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 Dr. Annette Stable expressed concerns of cord contusion due to worsening of myelopathy and MRI spine done which showed post op changes form ACDF C3-C6 and patchy signal abnormality involving the central and left cord at C5/6 concerning for contusion/injury. PT/OT evaluations completed revealing left sided weakness with sensory deficits affecting ADLs, needs +2 mod assist for mobility due to tendency for Knees to buckle with flexed trunk and post op pain. CIR recommended due to functional decline.    Medical History:   Past Medical History:  Diagnosis Date   Arthritis    Chronic kidney disease 2016   RENAL INFARCT   GERD (gastroesophageal reflux disease)    Hypertension    Reflux    Subdural hematoma ([place]) 1991         Patient Active Problem List   Diagnosis Date Noted   Chronic pain syndrome 11/21/2022   HNP (herniated nucleus pulposus) with myelopathy, cervical 11/15/2022   Cervical myelopathy (  [place]) 11/13/2022   OA (osteoarthritis) of hip 03/07/2022   Primary osteoarthritis of left hip 03/07/2022   GERD (gastroesophageal reflux disease) 11/27/2021   Encounter for screening colonoscopy    Benign neoplasm of rectosigmoid junction    Hyperlipidemia 12/18/2017    Essential hypertension 12/17/2016   Chest pain 05/30/2015   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 his dress was Appropriate and he was Well Groomed and his manners were Appropriate to the situation.  his participation was indicative of Appropriate and Attentive behaviors.  There were physical disabilities noted.  he displayed an appropriate level of cooperation and motivation.    Interactions:    Active Appropriate  Attention:   within normal limits and attention span and concentration were age appropriate  Memory:   within normal limits; recent and remote memory intact  Visuo-spatial:   not examined  Speech (Volume):  normal  Speech:   normal; normal  Thought Process:  Coherent and Relevant  Coherent, Directed, and Logical  Though Content:  WNL; not suicidal and not homicidal  Orientation:   person, place, time/date, and situation  Judgment:   Good  Planning:   Good  Affect:    Appropriate  Mood:    Dysphoric  Insight:   Good  Intelligence:   high  Psychiatric History:  No prior psychiatric history  Family Med/Psych History:  Family History  Problem Relation Age of Onset   Nephrolithiasis Mother    Cancer Father     Impression/DX:   Johnny Robinson is a 57 year old male referred for neuropsychological consultation with patient having extended hospital stay and current admission onto the comprehensive rehabilitation unit following cervical decompression surgery on 11/13/2022 and developing profound weakness for left upper extremity and left lower extremity functioning.  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  Patient underwent cervical decompression at the outpatient surgical center in 11/13/2022 and postoperatively was found to have profound weakness for left-sided motor functioning.  There was some improvements initially overnight but continued to have left-sided weakness.  MRI spine done which showed postoperative changes from ACDF between C3-C6 and patchy signal abnormalities involving the central cord and left cord at C5/C6 concerning for contusion/injury.  Patient has been improving with intensive inpatient therapies but continues to have motor weakness is on the left side as well as significant sensory changes that are affecting his functional capacity.  The patient was oriented x 4 with good mental status and describes stable mood status as well.  Patient reports that this has been stressful to him but he has had numerous medical events through the years including past renal infarction and subdural hematoma.  The patient reports that he is seeing improvements particularly for motor strength but continues with sensory disturbance and paresthesias on left side.  Disposition/Plan:  Today we worked on coping and adjustment issues with extended hospital stay and residual motor and sensory deficits.  The biggest stressor for the patient right now has to do with the paresthesias that he continues variants which are having an impact on his walking and capacity for ADLs.  Electronically Signed   _______________________ Ilean Skill, Psy.D. Clinical Neuropsychologist

## 2022-11-21 NOTE — Progress Notes (Signed)
Physical Therapy Session Note  Patient Details  Name: Johnny Robinson MRN: [id number] Date of Birth: 01/24/1966  Today's Date: 11/21/2022 PT Individual Time: [id number] + [id number] PT Individual Time Calculation (min): 30 min  + 25 min  Short Term Goals: Week 1:  PT Short Term Goal 1 (Week 1): Pt will ambulate 150 ft with min A PT Short Term Goal 2 (Week 1): Pt will ambulate 100 ft with no incidence of knee buckling PT Short Term Goal 3 (Week 1): Pt will perform standing activities with short bouts of UUE support  Skilled Therapeutic Interventions/Progress Updates:      1st session: Pt sitting in recliner to start - in agreement to therapy. No AFO or bracing on. Denies pain. Sit<>stand to RW with CGA from recliner. Ambulates with RW and CGA from his room to main rehab gym, ~184f. Focused session on dynamic gait training without an AD to challenge stability and dynamic balance. Gait training distances >2079fwith CGA/minA while not using AD - PT instructed patient on random increase/decrease in speeds, stop/go, 180deg turns, side stepping L<>R, and ambulating backwards. No LOB observed but occasional foot drag when fatigued. Returned to his room and patient ended session seated in recliner, all needs met.   2nd session: Pt sitting in recliner to start and agreeable to therapy. Patient without an AFO or bracing on, now with small based QC from prior PT session. Sit<>stand with supervision to QC. Ambulates with CGA and QC throughout session - x1 instance of catching L foot with self corrected LOB (catches foot when fatigued). Distances >15035fImproved stride/step length with cane compared to RW, as well as speed.   Stair training using no hand rails and QC only to simulate home entrance. Pt reports 4 STE home with no rails, full flight to 2nd floor with rails. Educated on sequencing with cane for stairs and he did x12, 6inch steps, with CGA using QC.   Standing there-ex in // bars with mirror for  visual feedback: -2x20 heel raises (unable to single leg heel raise on L, weak PF's) -1x12 hip abd/add -1x12 mini squats  Assisted onto nustep and needed setupA for LLE. Completed 9 minutes at L5 resistance, using BUE/BLE to target whole body strengthening and cardiovascular endurance training. Pt maintaining a cadence >40 steps/minute throughout.   Returned to room and patient ended session seated in recliner with a friend at the bedside. All needs met. Removed SAEBO from L wrist flexors - skin c/d/I without redness.    Therapy Documentation Precautions:  Precautions Precautions: Fall, Cervical Precaution Comments: reviewed cervical precautions Required Braces or Orthoses: Cervical Brace Cervical Brace: Soft collar Restrictions Weight Bearing Restrictions: No General:     Therapy/Group: Individual Therapy  Josede Cicero P Keyvin Rison PT 11/21/2022, 7:58 AM

## 2022-11-21 NOTE — Progress Notes (Signed)
PROGRESS NOTE   Subjective/Complaints:  Throat much less sore since stopped soft collar- esp when swallowing pills.   Waiting on OT- doing Saebo.  Took AFO /brace on LLE and doing ok without it.  LBM yesterday- now regular.  Some congestion, but not bad   ROS:  Pt denies SOB, abd pain, CP, N/V/C/D, and vision changes   Except for HPI  Objective:   DG Chest 1 View  Result Date: 11/19/2022 CLINICAL DATA:  Cough. EXAM: CHEST  1 VIEW COMPARISON:  05/30/2015. FINDINGS: Patchy opacities in the lower lungs, suspicious for multifocal pneumonia. Linear atelectasis or scarring in the left lung base. No pleural effusion or pneumothorax. Normal heart size and mediastinal contours. IMPRESSION: Patchy opacities in the lower lungs, suspicious for multifocal pneumonia. Electronically Signed   By: Emmit Alexanders M.D.   On: 11/19/2022 14:55   DG Abd 1 View  Result Date: 11/19/2022 CLINICAL DATA:  Constipation EXAM: ABDOMEN - 1 VIEW COMPARISON:  07/26/2020 FINDINGS: Mild gaseous distension of the colon. No abnormally dilated loops of small bowel. No gross free intraperitoneal air. No radio-opaque calculi or other significant radiographic abnormality are seen. IMPRESSION: Nonobstructive bowel gas pattern with mild gaseous distension of the colon. Electronically Signed   By: Davina Poke D.O.   On: 11/19/2022 10:41   Recent Labs    11/19/22 0823 11/20/22 0723  WBC 14.3* 8.9  HGB 15.4 14.4  HCT 44.2 40.6  PLT 234 235   Recent Labs    11/19/22 0823 11/20/22 0723  NA 132* 135  K 3.9 3.9  CL 97* 97*  CO2 25 27  GLUCOSE 141* 115*  BUN 23* 19  CREATININE 1.22 1.11  CALCIUM 8.7* 8.7*    Intake/Output Summary (Last 24 hours) at 11/21/2022 0815 Last data filed at 11/21/2022 0200 Gross per 24 hour  Intake 713 ml  Output 250 ml  Net 463 ml        Physical Exam: Vital Signs Blood pressure 120/73, pulse (!) 103, temperature 98.6 F  (37 C), temperature source Oral, resp. rate 18, height '6\' 1"'$  (1.854 m), weight 94.2 kg, SpO2 96 %.     General: awake, alert, appropriate, sitting in bedside chair; wife at bedside; NAD HENT: conjugate gaze; oropharynx a little dry- cervical incision looks great with steristrips CV: regular rate and rhythm- not tachycardic when listened no JVD Pulmonary: CTA B/L; no W/R/R- good air movement- slightly decreased at bases GI: soft, NT, ND, (+)BS Psychiatric: appropriate- bright affect Neurological: Ox3 Extremities; trace LUE swelling Musculoskeletal:  Wasn't able to get 1/5 in L grip or FA on my exam L HF 2/5; KE 4-/5; and DF/PF 4-/5    Cervical back: Neck supple.     Comments: RUE 5/5 in biceps, triceps, WE< grip and FA LUE- biceps 5-/5; triceps 4/5; WE 4/5; FA 0/5, FF and FE 0/5 RLE- 5/5 in HF, KE, KF DF and PF and EHL LLE- HF 2-/5; KE 4-/5; KF 2/5; DF 4-/5, PF 4/5; EHL 4-/5 - unchanged Skin:    General: Skin is warm and dry.     Comments: R handed IV Looks OK  Neurological:     Mental Status: He  is alert and oriented to person, place, and time.     Comments: Sensation intact to light touch in all 4 extremities except L C8 distribution is reduced Tone is normal in UE and LE Assessment/Plan: 1. Functional deficits which require 3+ hours per day of interdisciplinary therapy in a comprehensive inpatient rehab setting. Physiatrist is providing close team supervision and 24 hour management of active medical problems listed below. Physiatrist and rehab team continue to assess barriers to discharge/monitor patient progress toward functional and medical goals  Care Tool:  Bathing    Body parts bathed by patient: Left arm, Chest, Abdomen, Front perineal area, Right upper leg, Left upper leg, Face, Right lower leg   Body parts bathed by helper: Buttocks, Left lower leg, Right arm     Bathing assist Assist Level: Moderate Assistance - Patient 50 - 74%     Upper Body  Dressing/Undressing Upper body dressing   What is the patient wearing?: Pull over shirt    Upper body assist Assist Level: Moderate Assistance - Patient 50 - 74%    Lower Body Dressing/Undressing Lower body dressing      What is the patient wearing?: Pants     Lower body assist Assist for lower body dressing: Maximal Assistance - Patient 25 - 49%     Toileting Toileting    Toileting assist Assist for toileting: Maximal Assistance - Patient 25 - 49%     Transfers Chair/bed transfer  Transfers assist     Chair/bed transfer assist level: Moderate Assistance - Patient 50 - 74%     Locomotion Ambulation   Ambulation assist      Assist level: Moderate Assistance - Patient 50 - 74% Assistive device: Walker-rolling Max distance: 140 ft   Walk 10 feet activity   Assist     Assist level: Moderate Assistance - Patient - 50 - 74% Assistive device: Walker-rolling   Walk 50 feet activity   Assist    Assist level: Moderate Assistance - Patient - 50 - 74% Assistive device: Walker-rolling    Walk 150 feet activity   Assist Walk 150 feet activity did not occur: Safety/medical concerns         Walk 10 feet on uneven surface  activity   Assist Walk 10 feet on uneven surfaces activity did not occur: Safety/medical concerns         Wheelchair     Assist Is the patient using a wheelchair?: Yes (transport only on eval) Type of Wheelchair: Manual    Wheelchair assist level: Dependent - Patient 0%      Wheelchair 50 feet with 2 turns activity    Assist        Assist Level: Dependent - Patient 0%   Wheelchair 150 feet activity     Assist      Assist Level: Dependent - Patient 0%   Blood pressure 120/73, pulse (!) 103, temperature 98.6 F (37 C), temperature source Oral, resp. rate 18, height '6\' 1"'$  (1.854 m), weight 94.2 kg, SpO2 96 %.  Medical Problem List and Plan: 1. Functional deficits secondary to Cervical myelopathy  with L sided weakness s/p C3-6 ACDF             -patient may  shower- cover incision- will see if therapy can use Estim once assessed. No cancer hx.              -ELOS/Goals: 7-10 days- mod I to supervision  D/c 3/15  Con't CIR PT and OT- took  AFO off and doing well- PT to look at estim for LLE and doing Saebo for LUE 2.  Antithrombotics: -DVT/anticoagulation:  Mechanical: Sequential compression devices, below knee Bilateral lower extremities             Will see when can start Lovenox 3/4- Lovenox started Friday with OK from [facility]             -antiplatelet therapy: N/A 3. Pain Management: Hydrocodone and flexeril prn for pain   3/4- pain usually controlled- con't regimen- con't soft collar as well.   3/5- --Fexeril prn for muscle pain.              --gabapentin added BID to help LUE/LLE achy pain.  Pain well controlled- can stop use of soft collar per NS[facility]  3/6- throat less sore; overall pain controlled- con't regimen 4. Mood/Behavior/Sleep: LCSW to follow for evaluation and support.              3/5- mood in good spirits             -antipsychotic agents: N/A 5. Neuropsych/cognition: This patient is capable of making decisions on his own behalf. 6. Skin/Wound Care: Monitor wound for healing.  7. Fluids/Electrolytes/Nutrition: Monitor I/O. Check CMET in am.  8. Renal infarct//hx of CKD/HTN: Monitor BP TID--on metoprolol to keep SBP 90-100 range per wife             --was not taking Cozaar PTA.   3/1- BP well controlled- con't regimen 9. GERD: Continue PPI. 10. Labs: Wife reports no labs done pre-op.   --Recheck CMET/CBC in am.   3/4- labs look great- except Albumin of 2.9-  11. Constipation- LBM 3 days ago- not on anything scheduled- will schedule bowel meds- if no results by tomorrow, will do Sorbitol.   3/1- will give Sorbitol if no BM by 5pm.   3/4- had Mg citrate this weekend- had 1 small BM, but no more even with finishing mg citrate yesterday- will check KUB for possible ileus-    3/5- KUB showed small amount of stool- no ileus- did finally have good BM x2 and feels cleaned out.   3/6- now going daily-  12. Multifocal pneumonia  3/5- started on Augmentin per PA- which I agree with- feeling good today   -WBC was 14.5- down to 8.9 after augmentin- will con't  3/6- educated on Flutter valve and ICS- also has some congestion, but feels better than 2 days ago    I spent a total of 36   minutes on total care today- >50% coordination of care- due to  D/w pt as well as wife- answering questions about rest of stay and d/c plans.    LOS: 6 days A FACE TO FACE EVALUATION WAS PERFORMED  Maclin Guerrette 11/21/2022, 8:15 AM

## 2022-11-22 MED ORDER — LIDOCAINE HCL (PF) 1 % IJ SOLN
10.0000 mL | Freq: Once | INTRAMUSCULAR | Status: AC
  Administered 2022-11-22: 10 mL
  Filled 2022-11-22: qty 30

## 2022-11-22 MED ORDER — DICLOFENAC SODIUM 1 % EX GEL
4.0000 g | Freq: Four times a day (QID) | CUTANEOUS | Status: DC
  Administered 2022-11-22 – 2022-11-30 (×29): 4 g via TOPICAL
  Filled 2022-11-22 (×2): qty 100

## 2022-11-22 MED ORDER — BACLOFEN 5 MG HALF TABLET
5.0000 mg | ORAL_TABLET | Freq: Once | ORAL | Status: AC
  Administered 2022-11-22: 5 mg via ORAL
  Filled 2022-11-22: qty 1

## 2022-11-22 MED ORDER — BACLOFEN 5 MG HALF TABLET
5.0000 mg | ORAL_TABLET | Freq: Three times a day (TID) | ORAL | Status: DC | PRN
  Administered 2022-11-22 – 2022-11-24 (×3): 5 mg via ORAL
  Filled 2022-11-22 (×3): qty 1

## 2022-11-22 MED ORDER — METOCLOPRAMIDE HCL 5 MG PO TABS
5.0000 mg | ORAL_TABLET | Freq: Two times a day (BID) | ORAL | Status: DC
  Administered 2022-11-22: 5 mg via ORAL
  Filled 2022-11-22 (×2): qty 1

## 2022-11-22 MED ORDER — POLYETHYLENE GLYCOL 3350 17 G PO PACK: 17.0000 g | PACK | Freq: Every day | ORAL | 0 refills | Status: DC | PRN

## 2022-11-22 NOTE — Progress Notes (Signed)
Physical Therapy Session Note  Patient Details  Name: Johnny Robinson MRN: [id number] Date of Birth: 04-Aug-1966  Today's Date: 11/22/2022 PT Individual Time: [id number] PT Individual Time Calculation (min): 30 min   Short Term Goals: Week 1:  PT Short Term Goal 1 (Week 1): Pt will ambulate 150 ft with min A PT Short Term Goal 2 (Week 1): Pt will ambulate 100 ft with no incidence of knee buckling PT Short Term Goal 3 (Week 1): Pt will perform standing activities with short bouts of UUE support  Skilled Therapeutic Interventions/Progress Updates:      Pt sitting in recliner to start - reports poor nights rest due to posterior neck and L shldr pain - he recently received pain Rx and reports unrated pain. Reports MD plans to dry needle later this AM. Offered moist heat packs for reducing muscle tension and relaxing his muscles - pt in agreement.   Sit<>stand to QC in R hand with CGA for balance. Ambulates with CGA and narrow based QC to main rehab gym, ~18f. Retrieved cervical and lumbar moist heat packs and applied with patient positioned in supine with pillow support. Provided gentle proximal/distal hamstring stretching on LLE while lying in supine. Skin where heat packs applied was c/d/I with mild redness after application.   Neurosurgeon MD arriving during session to assess new pain. MD recommending using soft cervical collar while in therapies.   Pt ambulated back to his room, ~1552f with CGA and QC - patient requesting to sit in w/c per MD request for dry needling. All needs met at end of session.   Therapy Documentation Precautions:  Precautions Precautions: Fall, Cervical Precaution Comments: reviewed cervical precautions Required Braces or Orthoses: Cervical Brace Cervical Brace: Soft collar Restrictions Weight Bearing Restrictions: No General:   Therapy/Group: Individual Therapy  Monay Houlton P Jessica Checketts PT 11/22/2022, 7:37 AM

## 2022-11-22 NOTE — Progress Notes (Signed)
Physical Therapy Session Note  Patient Details  Name: Johnny Robinson MRN: [id number] Date of Birth: 1966/02/19  Today's Date: 11/22/2022 PT Individual Time: 1335-1415 PT Individual Time Calculation (min): 40 min   Short Term Goals: Week 1:  PT Short Term Goal 1 (Week 1): Pt will ambulate 150 ft with min A PT Short Term Goal 2 (Week 1): Pt will ambulate 100 ft with no incidence of knee buckling PT Short Term Goal 3 (Week 1): Pt will perform standing activities with short bouts of UUE support  Skilled Therapeutic Interventions/Progress Updates:    Pt received in recliner and agreeable to therapy.  Pt reports moderate shoulder pain, premedicated. Rest and positioning provided as needed. Pt ambulated with quad cane with CGA overall, occ min A. Cueing for increased foot clearance. Noted foot slap at times, but otherwise unhindered by DF weakness. Pt navigated 6" therapy stairs without difficulty, so transitioned to hospital stairs. Pt navigated x 2 flights with quad cane, and CGA, descending after short seated rest break.  Pt then performed task with narrow cones, retrieving from floor with L hand and then returning to floor for LUE NMR and squats for LB strength. Noted hip abduction weakness in squats, so transition to several rounds of side stepping. Pt also performed 2 bouts of step taps on 8" step, which fatigued him quickly. Pt returned to room after session and was left with all needs in reach and alarm active.   Therapy Documentation Precautions:  Precautions Precautions: Fall, Cervical Precaution Comments: reviewed cervical precautions Required Braces or Orthoses: Cervical Brace Cervical Brace: Soft collar Restrictions Weight Bearing Restrictions: No General:       Therapy/Group: Individual Therapy  Mickel Fuchs 11/22/2022, 3:53 PM

## 2022-11-22 NOTE — Progress Notes (Signed)
Patient complaining of an intense neck pain.  On assessment patient describes the pain as aching on the posterior neck  and left shoulder down to the elbow. He also states that it feels like his neck and shoulder muscles was pulling. The pain was constant that the patient was unable to sleep. PRN Flexeril, then Norco given without any relief. Patient tried different positions  even moving from bed to the recliner hoping to get some relief, ice pack was also provided, but the pain would not go away. Patient and spouse seems more concern as they report that this is new to the patient. On call provider [facility] notified. Baclofen 5 mg once ordered, and given to the patient. Patient now laying in the recliner with eyes closed has the nurse went for pain reassessment. Patient seems resting  as he didn't sleep all night. Will recheck the patient  again later.

## 2022-11-22 NOTE — Discharge Instructions (Addendum)
Inpatient Rehab Discharge Instructions  Johnny Robinson Discharge date and time:  11/30/22  Activities/Precautions/ Functional Status: Activity: no lifting, driving, or strenuous exercise till cleared by MD Diet: regular diet Wound Care: keep wound clean and dry. Contact Dr. Annette Stable if you develop any problems with your incision/wound--redness, swelling, increase in pain, drainage or if you develop fever or chills.    Functional status:  ___ No restrictions     ___ Walk up steps independently ___ 24/7 supervision/assistance   ___ Walk up steps with assistance _X__ Intermittent supervision/assistance  ___ Bathe/dress independently ___ Walk with walker     ___ Bathe/dress with assistance ___ Walk Independently    ___ Shower independently ___ Walk with assistance    __X_ Shower with assistance _X__ No alcohol     ___ Return to work/school ________   Special Instructions: No lifting items over 5 lbs, pushing, pulling or above head activity.  COMMUNITY REFERRALS UPON DISCHARGE:    Outpatient: PT     OT                 Agency:[facility]- [place] location  [phone]              Appointment Date/Time: *Please expect follow-up within 7-10 business days to schedule your appointment. If you have not received follow-up, be sure to make contact the site directly.*    My questions have been answered and I understand these instructions. I will adhere to these goals and the provided educational materials after my discharge from the hospital.  Patient/Caregiver Signature _______________________________ Date __________  Clinician Signature _______________________________________ Date __________  Please bring this form and your medication list with you to all your follow-up doctor's appointments.

## 2022-11-22 NOTE — Progress Notes (Signed)
Occupational Therapy Weekly Progress Note  Patient Details  Name: Johnny Robinson MRN: [id number] Date of Birth: [birth year]  Beginning of progress report period: November 16, 2022 End of progress report period: November 22, 2022  Patient has met 4 of 4 short term goals.  Pt made steady progress with BADLs and functional transfers during the past week. Pt completes bathing tasks with min A. LB dressing with mod A and UB dressing with supervision. LUE/hand responding to [facility] with trace finger flexion/extension. Functional transfers with min A/CGA using RW.   Patient continues to demonstrate the following deficits: muscle weakness, decreased cardiorespiratoy endurance, impaired timing and sequencing, unbalanced muscle activation, and decreased coordination, and decreased standing balance and decreased balance strategies and therefore will continue to benefit from skilled OT intervention to enhance overall performance with BADL and Reduce care partner burden.  Patient progressing toward long term goals..  Continue plan of care.  OT Short Term Goals Week 1:  OT Short Term Goal 1 (Week 1): Pt will complete 1/3 toileting steps with mod A for balance OT Short Term Goal 1 - Progress (Week 1): Met OT Short Term Goal 2 (Week 1): Pt will thread LB into clothing utilizing AE PRN with supervision OT Short Term Goal 2 - Progress (Week 1): Met OT Short Term Goal 3 (Week 1): Pt will complete sit > stand using LRAD with CGA in prep for ADL task OT Short Term Goal 3 - Progress (Week 1): Met OT Short Term Goal 4 (Week 1): Pt will complete toilet transfer with min A using LRAD OT Short Term Goal 4 - Progress (Week 1): Met Week 2:  OT Short Term Goal 1 (Week 2): STG=LTG 2/2 ELOS    [name] 11/22/2022, 6:55 AM

## 2022-11-22 NOTE — Progress Notes (Signed)
PROGRESS NOTE   Subjective/Complaints:  Congestion doing better Bowels working well Having intense neck pain- on L side of neck- no meds worked last night- and so got NO sleep.   Still painful even after 2 Norco this AM.   ROS:  Pt denies SOB, abd pain, CP, N/V/C/D, and vision changes  Except for HPI  Objective:   No results found. Recent Labs    11/19/22 0823 11/20/22 0723  WBC 14.3* 8.9  HGB 15.4 14.4  HCT 44.2 40.6  PLT 234 235   Recent Labs    11/19/22 0823 11/20/22 0723  NA 132* 135  K 3.9 3.9  CL 97* 97*  CO2 25 27  GLUCOSE 141* 115*  BUN 23* 19  CREATININE 1.22 1.11  CALCIUM 8.7* 8.7*   No intake or output data in the 24 hours ending 11/22/22 0816       Physical Exam: Vital Signs Blood pressure (!) 128/91, pulse 89, temperature 97.8 F (36.6 C), temperature source Oral, resp. rate 16, height '6\' 1"'$  (1.854 m), weight 94.2 kg, SpO2 97 %.     General: awake, alert, appropriate, sitting on mat in gym with OT in room; NAD HENT: conjugate gaze; oropharynx moist CV: regular rate and rhythm; no JVD Pulmonary: CTA B/L; no W/R/R- good air movement GI: soft, NT, ND, (+)BS Psychiatric: appropriate- obviously in pain Neurological: Ox3 MS: trigger points that are very TT L scalenes, upper traps, levators, splenius capitus and some L pec Extremities; trace LUE swelling Musculoskeletal:  Wasn't able to get 1/5 in L grip or FA on my exam L HF 2/5; KE 4-/5; and DF/PF 4-/5    Cervical back: Neck supple.     Comments: RUE 5/5 in biceps, triceps, WE< grip and FA LUE- biceps 5-/5; triceps 4/5; WE 4/5; FA 0/5, FF and FE 0/5 RLE- 5/5 in HF, KE, KF DF and PF and EHL LLE- HF 2-/5; KE 4-/5; KF 2/5; DF 4-/5, PF 4/5; EHL 4-/5 - unchanged Skin:    General: Skin is warm and dry.     Comments: R handed IV Looks OK  Neurological:     Mental Status: He is alert and oriented to person, place, and time.      Comments: Sensation intact to light touch in all 4 extremities except L C8 distribution is reduced Tone is normal in UE and LE Assessment/Plan: 1. Functional deficits which require 3+ hours per day of interdisciplinary therapy in a comprehensive inpatient rehab setting. Physiatrist is providing close team supervision and 24 hour management of active medical problems listed below. Physiatrist and rehab team continue to assess barriers to discharge/monitor patient progress toward functional and medical goals  Care Tool:  Bathing    Body parts bathed by patient: Left arm, Chest, Abdomen, Front perineal area, Right upper leg, Left upper leg, Face, Right lower leg   Body parts bathed by helper: Buttocks, Left lower leg, Right arm     Bathing assist Assist Level: Moderate Assistance - Patient 50 - 74%     Upper Body Dressing/Undressing Upper body dressing   What is the patient wearing?: Pull over shirt    Upper body assist Assist Level:  Moderate Assistance - Patient 50 - 74%    Lower Body Dressing/Undressing Lower body dressing      What is the patient wearing?: Pants     Lower body assist Assist for lower body dressing: Maximal Assistance - Patient 25 - 49%     Toileting Toileting    Toileting assist Assist for toileting: Maximal Assistance - Patient 25 - 49%     Transfers Chair/bed transfer  Transfers assist     Chair/bed transfer assist level: Moderate Assistance - Patient 50 - 74%     Locomotion Ambulation   Ambulation assist      Assist level: Moderate Assistance - Patient 50 - 74% Assistive device: Walker-rolling Max distance: 140 ft   Walk 10 feet activity   Assist     Assist level: Moderate Assistance - Patient - 50 - 74% Assistive device: Walker-rolling   Walk 50 feet activity   Assist    Assist level: Moderate Assistance - Patient - 50 - 74% Assistive device: Walker-rolling    Walk 150 feet activity   Assist Walk 150 feet activity  did not occur: Safety/medical concerns         Walk 10 feet on uneven surface  activity   Assist Walk 10 feet on uneven surfaces activity did not occur: Safety/medical concerns         Wheelchair     Assist Is the patient using a wheelchair?: Yes (transport only on eval) Type of Wheelchair: Manual    Wheelchair assist level: Dependent - Patient 0%      Wheelchair 50 feet with 2 turns activity    Assist        Assist Level: Dependent - Patient 0%   Wheelchair 150 feet activity     Assist      Assist Level: Dependent - Patient 0%   Blood pressure (!) 128/91, pulse 89, temperature 97.8 F (36.6 C), temperature source Oral, resp. rate 16, height '6\' 1"'$  (1.854 m), weight 94.2 kg, SpO2 97 %.  Medical Problem List and Plan: 1. Functional deficits secondary to Cervical myelopathy with L sided weakness s/p C3-6 ACDF             -patient may  shower- cover incision- will see if therapy can use Estim once assessed. No cancer hx.              -ELOS/Goals: 7-10 days- mod I to supervision  D/c 3/15  Con't CIR PT and OT- barrier is intense neck pain 2.  Antithrombotics: -DVT/anticoagulation:  Mechanical: Sequential compression devices, below knee Bilateral lower extremities             Will see when can start Lovenox 3/4- Lovenox started Friday with OK from [facility]             -antiplatelet therapy: N/A 3. Pain Management: Hydrocodone and flexeril prn for pain   3/4- pain usually controlled- con't regimen- con't soft collar as well.   3/5- --Fexeril prn for muscle pain.              --gabapentin added BID to help LUE/LLE achy pain.  Pain well controlled- can stop use of soft collar per [facility]  3/6- throat less sore; overall pain controlled- con't regimen  3/7- having intense L neck pain -will do trigger point injections- meds not helping 4. Mood/Behavior/Sleep: LCSW to follow for evaluation and support.              3/5- mood in good spirits              -  antipsychotic agents: N/A 5. Neuropsych/cognition: This patient is capable of making decisions on his own behalf. 6. Skin/Wound Care: Monitor wound for healing.  7. Fluids/Electrolytes/Nutrition: Monitor I/O. Check CMET in am.  8. Renal infarct//hx of CKD/HTN: Monitor BP TID--on metoprolol to keep SBP 90-100 range per wife             --was not taking Cozaar PTA.   3/1- BP well controlled- con't regimen 9. GERD: Continue PPI. 10. Labs: Wife reports no labs done pre-op.   --Recheck CMET/CBC in am.   3/4- labs look great- except Albumin of 2.9-  11. Constipation- LBM 3 days ago- not on anything scheduled- will schedule bowel meds- if no results by tomorrow, will do Sorbitol.   3/1- will give Sorbitol if no BM by 5pm.   3/4- had Mg citrate this weekend- had 1 small BM, but no more even with finishing mg citrate yesterday- will check KUB for possible ileus-   3/5- KUB showed small amount of stool- no ileus- did finally have good BM x2 and feels cleaned out.   3/6- now going daily-  12. Multifocal pneumonia  3/5- started on Augmentin per PA- which I agree with- feeling good today   -WBC was 14.5- down to 8.9 after augmentin- will con't  3/6- educated on Flutter valve and ICS- also has some congestion, but feels better than 2 days ago 13. L neck pain 3/7- pt needs trigger point injections- will do today at 10 am.      Patient here for trigger point injections for myofascial pain  Consent done and on chart.  Cleaned areas with alcohol and injected using a 27 gauge 1.5 inch needle  Injected 3cc- no wastage Using 1% Lidocaine with no EPI  Upper traps L only x3 Levators- L only Posterior scalenes- L only Middle scalenes- L only Splenius Capitus Pectoralis Major- L only Rhomboids Infraspinatus Teres Major/minor Thoracic paraspinals Lumbar paraspinals Other injections-    Patient's level of pain prior was 8/10 Current level of pain after injections is 0-1/10  There was no  bleeding or complications.  Patient was advised to drink a lot of water on day after injections to flush system Will have increased soreness for 12-48 hours after injections.  Can use Lidocaine patches the day AFTER injections Can use theracane on day of injections in places didn't inject Can use heating pad 4-6 hours AFTER injections   I spent a total of  55  minutes on total care today- >50% coordination of care- due to  Due to doing trigger point injections as well as seeing pt a second time and d/w OT- who's going to do myofascial release as well.   LOS: 7 days A FACE TO FACE EVALUATION WAS PERFORMED  Albertia Carvin 11/22/2022, 8:16 AM

## 2022-11-22 NOTE — Progress Notes (Signed)
Occupational Therapy Session Note  Patient Details  Name: Johnny Robinson MRN: [id number] Date of Birth: December 14, 1965  Today's Date: 11/22/2022 OT Individual Time: 1030-1130 OT Individual Time Calculation (min): 60 min    Short Term Goals: Week 1:  OT Short Term Goal 1 (Week 1): Pt will complete 1/3 toileting steps with mod A for balance OT Short Term Goal 1 - Progress (Week 1): Met OT Short Term Goal 2 (Week 1): Pt will thread LB into clothing utilizing AE PRN with supervision OT Short Term Goal 2 - Progress (Week 1): Met OT Short Term Goal 3 (Week 1): Pt will complete sit > stand using LRAD with CGA in prep for ADL task OT Short Term Goal 3 - Progress (Week 1): Met OT Short Term Goal 4 (Week 1): Pt will complete toilet transfer with min A using LRAD OT Short Term Goal 4 - Progress (Week 1): Met  Skilled Therapeutic Interventions/Progress Updates:    Pain reported during session as 2/10. Rest and repositiong  provided for pain relief. Patient actively participated in the [place] program in 1:1 but with other pt in room setting for social participation. Session focused on education and training in breathing techniques to regulate the nervous system, gentle yoga poses with a focus on balance, guided meditation to regulate attention and guided discussion on the topic of feeling whole. Skilled treatment interventions include. Patient required min cues and CGA  assist for any standing balance/warrior poses. Exited session with pt seated in recliner, exit alarm on and call light in reach   Therapy Documentation Precautions:  Precautions Precautions: Fall, Cervical Precaution Comments: reviewed cervical precautions Required Braces or Orthoses: Cervical Brace Cervical Brace: Soft collar Restrictions Weight Bearing Restrictions: No General:    Therapy/Group: Individual Therapy  Tonny Branch 11/22/2022, 6:49 AM

## 2022-11-22 NOTE — Progress Notes (Signed)
Occupational Therapy Session Note  Patient Details  Name: Johnny Robinson MRN: [id number] Date of Birth: Jun 05, 1966  Today's Date: 11/22/2022 OT Individual Time: 0700-0810 OT Individual Time Calculation (min): 70 min    Short Term Goals: Week 2:  OT Short Term Goal 1 (Week 2): STG=LTG 2/2 ELOS  Skilled Therapeutic Interventions/Progress Updates:    Pt resting in recliner upon arrival. See pain below. Pt amb with SBQC to gym and initially engaged in LUE NMR with weight bearing to facilitate improved finger flexion/extension. Pt practiced walk in shower tranfsers using SBQC and stepping into shower. Pt unable to achieve complete hip flexion to raise LLE into shower but is able to complete by dragging foot over ledge. Pt amb to day room. 7 mins NuStep BLE only. Pt amb with SBQC to room and returned to recliner. Saebo on LUE for finger flexion. Saebo Stim One 330 pulse width 35 Hz pulse rate On 8 sec/ off 8 sec Ramp up/ down 2 sec Symmetrical Biphasic wave form  Max intensity 187m at 500 Ohm load  Therapist returned to room to remove. No adverse reactions.    Therapy Documentation Precautions:  Precautions Precautions: Fall, Cervical Precaution Comments: reviewed cervical precautions Required Braces or Orthoses: Cervical Brace Cervical Brace: Soft collar Restrictions Weight Bearing Restrictions: No Pain: 2nd Pain Site Pain Score: 8 Faces Pain Scale: Hurts even more Pain Type: Acute pain Pain Location: Shoulder Pain Orientation: Left Pain Radiating Towards: Left arm Pain Descriptors / Indicators: Aching Myofascial release   Therapy/Group: Individual Therapy  [name]3/03/2023, 8:15 AM

## 2022-11-22 NOTE — Plan of Care (Signed)
  Problem: RH Dressing Goal: LTG Patient will perform lower body dressing w/assist (OT) Description: LTG: Patient will perform lower body dressing with assist, with/without cues in positioning using equipment (OT) Flowsheets (Taken 11/22/2022 1324) LTG: Pt will perform lower body dressing with assistance level of: (downgraded JLS) Minimal Assistance - Patient > 75% Note: downgraded JLS

## 2022-11-22 NOTE — Progress Notes (Signed)
Patient with some increased neck pain with some radiation along his left shoulder and arm last night.  More comfortable this morning.  Patient denies any current radiating pain.  He thinks he may have Overdone it with therapy the day before.  His wound looks good.  His strength continues to improve.  Over the last few days he has had marked improvement of his left hip flexion strength.  He is now standing and walking independently reasonably well.  He has good strength in his left upper extremity except he continues to have severe grip and intrinsic weakness.  He now has some mild voluntary grip strength.  He has some minimal finger flaring with attempts at finger extension.  I think the patient is suffering some increased neck pain secondary to muscular ligamentous strain and some irritation of his surgery site.  I discussed with him the possibility of wearing a soft collar when participating in more vigorous activities with physical therapy.  He seems amenable to this.  I will continue to follow.

## 2022-11-23 ENCOUNTER — Inpatient Hospital Stay (HOSPITAL_COMMUNITY): Payer: Commercial Managed Care - PPO

## 2022-11-23 MED ORDER — METOCLOPRAMIDE HCL 5 MG PO TABS
5.0000 mg | ORAL_TABLET | Freq: Every day | ORAL | Status: AC
  Administered 2022-11-24 – 2022-11-25 (×2): 5 mg via ORAL
  Filled 2022-11-23 (×2): qty 1

## 2022-11-23 NOTE — Progress Notes (Signed)
PROGRESS NOTE   Subjective/Complaints:  Pt reports cannot use LUE very well due to extreme pain in L shoulder and L upper arm- makes it hard to use the arm, with that pain.  Educated pt that when arm is weak, easy to develop overuse issues  Did have great response to Trigger point injections of L neck/upper trap/and L pec.   Will plan on more injections on Tuesday   ROS:   Pt denies SOB, abd pain, CP, N/V/C/D, and vision changes L shoulder pain Except for HPI  Objective:   No results found. No results for input(s): "WBC", "HGB", "HCT", "PLT" in the last 72 hours.  No results for input(s): "NA", "K", "CL", "CO2", "GLUCOSE", "BUN", "CREATININE", "CALCIUM" in the last 72 hours.   Intake/Output Summary (Last 24 hours) at 11/23/2022 0826 Last data filed at 11/23/2022 0804 Gross per 24 hour  Intake 120 ml  Output --  Net 120 ml         Physical Exam: Vital Signs Blood pressure 116/74, pulse 79, temperature 98.2 F (36.8 C), resp. rate 16, height '6\' 1"'$  (1.854 m), weight 94.2 kg, SpO2 95 %.      General: awake, alert, appropriate, sitting on nustep in dayroom with OTA; NAD HENT: conjugate gaze; oropharynx moist CV: regular rate; no JVD Pulmonary: CTA B/L; no W/R/R- good air movement GI: soft, NT, ND, (+)BS Psychiatric: appropriate- interactive Neurological: Ox3  Musculoskeletal: trigger points palpated in L biceps and L deltoid- also has TTP over L biceps origin anterior shoulder Wasn't able to get 1/5 in L grip or FA on my exam L HF 2/5; KE 4-/5; and DF/PF 4-/5    Cervical back: Neck supple.     Comments: RUE 5/5 in biceps, triceps, WE< grip and FA LUE- biceps 5-/5; triceps 4/5; WE 4/5; FA 0/5, FF and FE 0/5 RLE- 5/5 in HF, KE, KF DF and PF and EHL LLE- HF 2-/5; KE 4-/5; KF 2/5; DF 4-/5, PF 4/5; EHL 4-/5 - unchanged Skin:    General: Skin is warm and dry.     Comments: R handed IV Looks OK  Neurological:      Mental Status: He is alert and oriented to person, place, and time.     Comments: Sensation intact to light touch in all 4 extremities except L C8 distribution is reduced Tone is normal in UE and LE Assessment/Plan: 1. Functional deficits which require 3+ hours per day of interdisciplinary therapy in a comprehensive inpatient rehab setting. Physiatrist is providing close team supervision and 24 hour management of active medical problems listed below. Physiatrist and rehab team continue to assess barriers to discharge/monitor patient progress toward functional and medical goals  Care Tool:  Bathing    Body parts bathed by patient: Left arm, Chest, Abdomen, Front perineal area, Right upper leg, Left upper leg, Face, Right lower leg   Body parts bathed by helper: Buttocks, Left lower leg, Right arm     Bathing assist Assist Level: Moderate Assistance - Patient 50 - 74%     Upper Body Dressing/Undressing Upper body dressing   What is the patient wearing?: Pull over shirt    Upper body assist  Assist Level: Moderate Assistance - Patient 50 - 74%    Lower Body Dressing/Undressing Lower body dressing      What is the patient wearing?: Pants     Lower body assist Assist for lower body dressing: Maximal Assistance - Patient 25 - 49%     Toileting Toileting    Toileting assist Assist for toileting: Maximal Assistance - Patient 25 - 49%     Transfers Chair/bed transfer  Transfers assist     Chair/bed transfer assist level: Moderate Assistance - Patient 50 - 74%     Locomotion Ambulation   Ambulation assist      Assist level: Moderate Assistance - Patient 50 - 74% Assistive device: Walker-rolling Max distance: 140 ft   Walk 10 feet activity   Assist     Assist level: Moderate Assistance - Patient - 50 - 74% Assistive device: Walker-rolling   Walk 50 feet activity   Assist    Assist level: Moderate Assistance - Patient - 50 - 74% Assistive device:  Walker-rolling    Walk 150 feet activity   Assist Walk 150 feet activity did not occur: Safety/medical concerns         Walk 10 feet on uneven surface  activity   Assist Walk 10 feet on uneven surfaces activity did not occur: Safety/medical concerns         Wheelchair     Assist Is the patient using a wheelchair?: Yes (transport only on eval) Type of Wheelchair: Manual    Wheelchair assist level: Dependent - Patient 0%      Wheelchair 50 feet with 2 turns activity    Assist        Assist Level: Dependent - Patient 0%   Wheelchair 150 feet activity     Assist      Assist Level: Dependent - Patient 0%   Blood pressure 116/74, pulse 79, temperature 98.2 F (36.8 C), resp. rate 16, height '6\' 1"'$  (1.854 m), weight 94.2 kg, SpO2 95 %.  Medical Problem List and Plan: 1. Functional deficits secondary to Cervical myelopathy with L sided weakness s/p C3-6 ACDF             -patient may  shower- cover incision- will see if therapy can use Estim once assessed. No cancer hx.              -ELOS/Goals: 7-10 days- mod I to supervision  D/c 3/15  Con't CIR PT and OT- Tom- OTA to do myofascial release on L shoulder pain- neck pain much improved 2.  Antithrombotics: -DVT/anticoagulation:  Mechanical: Sequential compression devices, below knee Bilateral lower extremities             Will see when can start Lovenox 3/4- Lovenox started Friday with OK from [facility]             -antiplatelet therapy: N/A 3. Pain Management: Hydrocodone and flexeril prn for pain   3/4- pain usually controlled- con't regimen- con't soft collar as well.   3/5- --Fexeril prn for muscle pain.              --gabapentin added BID to help LUE/LLE achy pain.  Pain well controlled- can stop use of soft collar per [facility]  3/6- throat less sore; overall pain controlled- con't regimen  3/7- having intense L neck pain -will do trigger point injections- meds not helping  3/8- Having to wake up at night  to take pain meds- set alarms to do so- says pain skyrockets  if doesn't- con't regimen for now 4. Mood/Behavior/Sleep: LCSW to follow for evaluation and support.              3/5- mood in good spirits             -antipsychotic agents: N/A 5. Neuropsych/cognition: This patient is capable of making decisions on his own behalf. 6. Skin/Wound Care: Monitor wound for healing.  7. Fluids/Electrolytes/Nutrition: Monitor I/O. Check CMET in am.  8. Renal infarct//hx of CKD/HTN: Monitor BP TID--on metoprolol to keep SBP 90-100 range per wife             --was not taking Cozaar PTA.   3/1- BP well controlled- con't regimen 9. GERD: Continue PPI. 10. Labs: Wife reports no labs done pre-op.   --Recheck CMET/CBC in am.   3/4- labs look great- except Albumin of 2.9-  11. Constipation- LBM 3 days ago- not on anything scheduled- will schedule bowel meds- if no results by tomorrow, will do Sorbitol.   3/1- will give Sorbitol if no BM by 5pm.   3/4- had Mg citrate this weekend- had 1 small BM, but no more even with finishing mg citrate yesterday- will check KUB for possible ileus-   3/5- KUB showed small amount of stool- no ileus- did finally have good BM x2 and feels cleaned out.   3/8- now going daily- no issues 12. Multifocal pneumonia  3/5- started on Augmentin per PA- which I agree with- feeling good today   -WBC was 14.5- down to 8.9 after augmentin- will con't  3/6- educated on Flutter valve and ICS- also has some congestion, but feels better than 2 days ago 13. L neck and L shoulder/anterior/biceps tendinitis pain 3/7- pt needs trigger point injections- will do today at 10 am.    3/8- Neck pain much better- still having L shoulder and L biceps pain- will need to do additional injection next Tuesday- since I'm in clinic Friday/Monday   3/7- Patient here for trigger point injections for myofascial pain  Consent done and on chart.  Cleaned areas with alcohol and injected using a 27 gauge 1.5 inch  needle  Injected 3cc- no wastage Using 1% Lidocaine with no EPI  Upper traps L only x3 Levators- L only Posterior scalenes- L only Middle scalenes- L only Splenius Capitus Pectoralis Major- L only Rhomboids Infraspinatus Teres Major/minor Thoracic paraspinals Lumbar paraspinals Other injections-    Patient's level of pain prior was 8/10 Current level of pain after injections is 0-1/10  There was no bleeding or complications.  Patient was advised to drink a lot of water on day after injections to flush system Will have increased soreness for 12-48 hours after injections.  Can use Lidocaine patches the day AFTER injections Can use theracane on day of injections in places didn't inject Can use heating pad 4-6 hours AFTER injections   I spent a total of  39   minutes on total care today- >50% coordination of care- due to  D/w pt and OT about myofascial pain/overuse- I can do trigger point injections and possibly a L biceps tendonitis injection next Tuesday, but cannot do before, because I'm in clinic-   LOS: 8 days A FACE TO FACE EVALUATION WAS PERFORMED  Doretta Remmert 11/23/2022, 8:26 AM

## 2022-11-23 NOTE — Progress Notes (Signed)
Physical Therapy Session Note  Patient Details  Name: Johnny Robinson MRN: [id number] Date of Birth: 1966-07-21  Today's Date: 11/23/2022 PT Individual Time: 0900-1015 PT Individual Time Calculation (min): 75 min   Short Term Goals: Week 1:  PT Short Term Goal 1 (Week 1): Pt will ambulate 150 ft with min A PT Short Term Goal 2 (Week 1): Pt will ambulate 100 ft with no incidence of knee buckling PT Short Term Goal 3 (Week 1): Pt will perform standing activities with short bouts of UUE support  Skilled Therapeutic Interventions/Progress Updates:    Pt received in recliner and agreeable to therapy. Pt reports neck pain has improved but shoulder pain still bothering him. Discussed shoulder anatomy and pain location. Pt particated in shoulder range activities with arm supported on ranger for decreased pain. Discussed working pain free range for improved motion and decreased pain. Discussed at length adjusting posture for improved "base" for scapular movement as pt usually sits with lateral slouch to R side.  Transitioned to lower body NMR. Pt participated in 4 bouts of squats with RTB around knees to improve glute med and external rotator engagement. Cues for even weight bearing. Pt then performed marches from staggered stance with resistance of RTB. Performed to fatigue x 3 bouts. Pt demoes improving hip flexion strength. On return to room pt performed 3 bouts of ankle DF against RTB for improved DF during gait. Noted motor impersistence, pt educated on performing over the weekend during decreased therapy schedule.   Pt ambulated with quad cane and CGA throughout session. Demoes improving L foot clearance and hip flexion, with foot slap noted with fatigue. Pt returned to room after session and remained in reclienr with all needs in reach.   Therapy Documentation Precautions:  Precautions Precautions: Fall, Cervical Precaution Comments: reviewed cervical precautions Required Braces or Orthoses:  Cervical Brace Cervical Brace: Soft collar Restrictions Weight Bearing Restrictions: No General:       Therapy/Group: Individual Therapy  Mickel Fuchs 11/23/2022, 12:27 PM

## 2022-11-23 NOTE — Progress Notes (Signed)
Occupational Therapy Session Note  Patient Details  Name: Johnny Robinson MRN: [id number] Date of Birth: 06-25-1966  Today's Date: 11/23/2022 OT Individual Time: 1300-1340 OT Individual Time Calculation (min): 40 min    Short Term Goals: Week 2:  OT Short Term Goal 1 (Week 2): STG=LTG 2/2 ELOS  Skilled Therapeutic Interventions/Progress Updates:    OT intervention with focus on functional amb with [facility] in community setting, activity tolerance, energy conservation, and safety awareness. Pt amb with [facility] throughout session. Pt amb to W/C entrance and rested on bench in courtyard. Pt amb around small fountain and into covered area by [facility]. Pt amb past entrance and down hill to bottom of hill before resting on bench. Pt amb up hill and into atrium before resting. Pt amb back to room. Discussed energy conservation strategies especially when out in community. Pt returned to recliner. All needs within reach.   Therapy Documentation Precautions:  Precautions Precautions: Fall, Cervical Precaution Comments: reviewed cervical precautions Required Braces or Orthoses: Cervical Brace Cervical Brace: Soft collar Restrictions Weight Bearing Restrictions: No Pain:  Pt reports pain in LUE/shoulder is easing off   Therapy/Group: Individual Therapy  [name] 11/23/2022, 2:20 PM

## 2022-11-23 NOTE — Progress Notes (Addendum)
Occupational Therapy Session Note  Patient Details  Name: LANCE MONTFORD MRN: [id number] Date of Birth: 01/31/1966  Today's Date: 11/23/2022 OT Individual Time: [id number] OT Individual Time Calculation (min): 71 min    Short Term Goals: Week 2:  OT Short Term Goal 1 (Week 2): STG=LTG 2/2 ELOS  Skilled Therapeutic Interventions/Progress Updates:    Pt resting in recliiner upon arrival. Pt amb with [facility] to gym and sat EOM. EOM>supine to facilitate myofascial release on LUE with slight relief noted. Pt amb to ortho gym. 7 mins SciFit (coban used to assist with LUE grasp) at level 1 for LUE mobility. Pt noted additional relief in LUE. Pt amb to day room. 7 mins NuStep BLE only level 6. Pt engaged in Wii bowling while standing without UE support. Pt stepping forward with LLE when swinging RUE to release ball. No LOB noted. Pt returned to room and sat in relciner. All needs within reach.   Saebo placed on LUE to facilitate finger flexion.  Saebo Stim One 330 pulse width 35 Hz pulse rate On 8 sec/ off 8 sec Ramp up/ down 2 sec Symmetrical Biphasic wave form  Max intensity 166m at 500 Ohm load  Therapist returned to room and removed Saebo at end of cycle. No adverse reactions noted.  Therapy Documentation Precautions:  Precautions Precautions: Fall, Cervical Precaution Comments: reviewed cervical precautions Required Braces or Orthoses: Cervical Brace Cervical Brace: Soft collar Restrictions Weight Bearing Restrictions: No Pain: Pt reports pain in shoulder is easing off but also reports ongoing pain biceps; myofascial release  Therapy/Group: Individual Therapy  [name]3/04/2023, 8:13 AM

## 2022-11-24 DIAGNOSIS — I10 Essential (primary) hypertension: Secondary | ICD-10-CM

## 2022-11-24 DIAGNOSIS — M7918 Myalgia, other site: Secondary | ICD-10-CM

## 2022-11-24 MED ORDER — CYCLOBENZAPRINE HCL 5 MG PO TABS
5.0000 mg | ORAL_TABLET | Freq: Three times a day (TID) | ORAL | Status: DC | PRN
  Administered 2022-11-24 – 2022-11-30 (×9): 5 mg via ORAL
  Filled 2022-11-24 (×8): qty 1

## 2022-11-24 NOTE — Progress Notes (Signed)
PROGRESS NOTE   Subjective/Complaints:  Pt doing well overall. Happy with the motor return he's experiencing. Still having discomfort in his lower neck/trap on left. TPI's helped somewhat. Uses baclofen at night with modest relief. Can't tolerate during day d/t fatigue. Uses flexeril at home which has helped him and he tolerates somewhat  ROS: Patient denies fever, rash, sore throat, blurred vision, dizziness, nausea, vomiting, diarrhea, cough, shortness of breath or chest pain,  headache, or mood change.   Objective:   DG Cervical Spine 2 or 3 views  Result Date: 11/23/2022 CLINICAL DATA:  Neck pain EXAM: CERVICAL SPINE - 2-3 VIEW COMPARISON:  MRI cervical spine 11/14/2022. FINDINGS: Prior C3-C6 ACDF. Hardware is intact without evidence of loosening. There is mild degenerative disc disease at C6-C7. There is mild to moderate multilevel facet arthropathy. Persistent prevertebral soft tissue swelling. IMPRESSION: Prior ACDF from C3-C6. Intact hardware without evidence of loosening. Persistent prevertebral soft tissue swelling. Mild degenerative disc disease at C6-C7. Mild to moderate multilevel facet arthropathy. Electronically Signed   By: Maurine Simmering M.D.   On: 11/23/2022 12:39   No results for input(s): "WBC", "HGB", "HCT", "PLT" in the last 72 hours.  No results for input(s): "NA", "K", "CL", "CO2", "GLUCOSE", "BUN", "CREATININE", "CALCIUM" in the last 72 hours.   Intake/Output Summary (Last 24 hours) at 11/24/2022 0947 Last data filed at 11/24/2022 0734 Gross per 24 hour  Intake 1080 ml  Output --  Net 1080 ml         Physical Exam: Vital Signs Blood pressure 126/77, pulse 82, temperature 98 F (36.7 C), temperature source Oral, resp. rate 14, height '6\' 1"'$  (1.854 m), weight 94.2 kg, SpO2 96 %.      Constitutional: No distress . Vital signs reviewed. HEENT: NCAT, EOMI, oral membranes moist Neck: supple Cardiovascular:  RRR without murmur. No JVD    Respiratory/Chest: CTA Bilaterally without wheezes or rales. Normal effort    GI/Abdomen: BS +, non-tender, non-distended Ext: no clubbing, cyanosis, or edema Psych: pleasant and cooperative  Musculoskeletal: trigger points palpated in L biceps and L deltoid- also has TTP over L biceps origin anterior shoulder Wasn't able to get 1/5 in L grip or FA on my exam L HF 2/5; KE 4-/5; and DF/PF 4-/5    Cervical back: Neck supple.     Comments: RUE 5/5 in biceps, triceps, WE< grip and FA LUE- biceps 5-/5; triceps 4/5; WE 4/5; FA 2 to 2+/5, FF and FE 2 to 2+/5 RLE- 5/5 in HF, KE, KF DF and PF and EHL LLE- HF 3/5; KE 4-/5; KF 2/5; DF 4-/5, PF 4/5; EHL 4-/5 -  Skin:    General: Skin is warm and dry.     Comments: R handed IV Looks OK  Neurological:     Mental Status: He is alert and oriented to person, place, and time.     Comments: Sensation intact to light touch in all 4 extremities except L C8 distribution is reduced Tone is normal in UE and LE   Assessment/Plan: 1. Functional deficits which require 3+ hours per day of interdisciplinary therapy in a comprehensive inpatient rehab setting. Physiatrist is providing close team supervision and  24 hour management of active medical problems listed below. Physiatrist and rehab team continue to assess barriers to discharge/monitor patient progress toward functional and medical goals  Care Tool:  Bathing    Body parts bathed by patient: Left arm, Chest, Abdomen, Front perineal area, Right upper leg, Left upper leg, Face, Right lower leg   Body parts bathed by helper: Buttocks, Left lower leg, Right arm     Bathing assist Assist Level: Moderate Assistance - Patient 50 - 74%     Upper Body Dressing/Undressing Upper body dressing   What is the patient wearing?: Pull over shirt    Upper body assist Assist Level: Moderate Assistance - Patient 50 - 74%    Lower Body Dressing/Undressing Lower body dressing       What is the patient wearing?: Pants     Lower body assist Assist for lower body dressing: Maximal Assistance - Patient 25 - 49%     Toileting Toileting    Toileting assist Assist for toileting: Maximal Assistance - Patient 25 - 49%     Transfers Chair/bed transfer  Transfers assist     Chair/bed transfer assist level: Moderate Assistance - Patient 50 - 74%     Locomotion Ambulation   Ambulation assist      Assist level: Moderate Assistance - Patient 50 - 74% Assistive device: Walker-rolling Max distance: 140 ft   Walk 10 feet activity   Assist     Assist level: Moderate Assistance - Patient - 50 - 74% Assistive device: Walker-rolling   Walk 50 feet activity   Assist    Assist level: Moderate Assistance - Patient - 50 - 74% Assistive device: Walker-rolling    Walk 150 feet activity   Assist Walk 150 feet activity did not occur: Safety/medical concerns         Walk 10 feet on uneven surface  activity   Assist Walk 10 feet on uneven surfaces activity did not occur: Safety/medical concerns         Wheelchair     Assist Is the patient using a wheelchair?: Yes (transport only on eval) Type of Wheelchair: Manual    Wheelchair assist level: Dependent - Patient 0%      Wheelchair 50 feet with 2 turns activity    Assist        Assist Level: Dependent - Patient 0%   Wheelchair 150 feet activity     Assist      Assist Level: Dependent - Patient 0%   Blood pressure 126/77, pulse 82, temperature 98 F (36.7 C), temperature source Oral, resp. rate 14, height '6\' 1"'$  (1.854 m), weight 94.2 kg, SpO2 96 %.  Medical Problem List and Plan: 1. Functional deficits secondary to Cervical myelopathy with L sided weakness s/p C3-6 ACDF             -patient may  shower- cover incision- will see if therapy can use Estim once assessed. No cancer hx.              -ELOS/Goals: 7-10 days- mod I to supervision  D/c 3/15  -Continue CIR  therapies including PT, OT  2.  Antithrombotics: -DVT/anticoagulation:  Mechanical: Sequential compression devices, below knee Bilateral lower extremities             Will see when can start Lovenox 3/4- Lovenox started Friday with OK from [facility]             -antiplatelet therapy: N/A 3. Pain Management: Hydrocodone and flexeril prn  for pain   3/4- pain usually controlled- con't regimen- con't soft collar as well.   3/5- --Fexeril prn for muscle pain.              --gabapentin added BID to help LUE/LLE achy pain.  Pain well controlled- can stop use of soft collar per [facility]  3/6- throat less sore; overall pain controlled- con't regimen  3/7- having intense L neck pain -will do trigger point injections- meds not helping  3/8- Having to wake up at night to take pain meds- set alarms to do so- says pain skyrockets if doesn't- con't regimen for now  3/9 only using baclofen at night due to sedation and with middling effect. Used flexeril at home. Didn't mention that it was tried before here ('10mg'$ ).  Will try '5mg'$  flexeril tid prn and hold baclofen. Also consider tizanidine trial 4. Mood/Behavior/Sleep: LCSW to follow for evaluation and support.              3/5- mood in good spirits             -antipsychotic agents: N/A 5. Neuropsych/cognition: This patient is capable of making decisions on his own behalf. 6. Skin/Wound Care: Monitor wound for healing.  7. Fluids/Electrolytes/Nutrition: Monitor I/O. Check CMET in am.  8. Renal infarct//hx of CKD/HTN: Monitor BP TID--on metoprolol to keep SBP 90-100 range per wife             --was not taking Cozaar PTA.   3/9- BP well controlled- con't regimen 9. GERD: Continue PPI. 10. Labs: Wife reports no labs done pre-op.   --Recheck CMET/CBC in am.   3/4- labs look great- except Albumin of 2.9-  11. Constipation- LBM 3 days ago- not on anything scheduled- will schedule bowel meds- if no results by tomorrow, will do Sorbitol.   3/1- will give Sorbitol if no BM  by 5pm.   3/4- had Mg citrate this weekend- had 1 small BM, but no more even with finishing mg citrate yesterday- will check KUB for possible ileus-   3/5- KUB showed small amount of stool- no ileus- did finally have good BM x2 and feels cleaned out.   3/8-9- now going daily- no issues 12. Multifocal pneumonia  3/5- started on Augmentin per PA- which I agree with- feeling good today   -WBC was 14.5- down to 8.9 after augmentin- will con't  3/6- educated on Flutter valve and ICS- also has some congestion, but feels better than 2 days ago 13. L neck and L shoulder/anterior/biceps tendinitis pain 3/7- pt needs trigger point injections- will do today at 10 am.    3/8- Neck pain much better- still having L shoulder and L biceps pain- will need to do additional injection next Tuesday- since I'm in clinic Friday/Monday  3/9 reports ongoing trap pain today in addition to above.    -flexeril trial as above in #3       LOS: 9 days A FACE TO FACE EVALUATION WAS PERFORMED  Meredith Staggers 11/24/2022, 9:47 AM

## 2022-11-25 MED ORDER — MAGNESIUM CITRATE PO SOLN
1.0000 | Freq: Once | ORAL | Status: AC
  Administered 2022-11-25: 1 via ORAL
  Filled 2022-11-25: qty 296

## 2022-11-25 NOTE — Progress Notes (Signed)
PROGRESS NOTE   Subjective/Complaints:  Reports that flexeril was more helpful for his shoulder girdle/trap spasms than baclofen. It does make him a little sleepy but he's willing to tolerate. Does report constipation even though he had mushy stool last night.   Objective:   DG Cervical Spine 2 or 3 views  Result Date: 11/23/2022 CLINICAL DATA:  Neck pain EXAM: CERVICAL SPINE - 2-3 VIEW COMPARISON:  MRI cervical spine 11/14/2022. FINDINGS: Prior C3-C6 ACDF. Hardware is intact without evidence of loosening. There is mild degenerative disc disease at C6-C7. There is mild to moderate multilevel facet arthropathy. Persistent prevertebral soft tissue swelling. IMPRESSION: Prior ACDF from C3-C6. Intact hardware without evidence of loosening. Persistent prevertebral soft tissue swelling. Mild degenerative disc disease at C6-C7. Mild to moderate multilevel facet arthropathy. Electronically Signed   By: Maurine Simmering M.D.   On: 11/23/2022 12:39   No results for input(s): "WBC", "HGB", "HCT", "PLT" in the last 72 hours.  No results for input(s): "NA", "K", "CL", "CO2", "GLUCOSE", "BUN", "CREATININE", "CALCIUM" in the last 72 hours.   Intake/Output Summary (Last 24 hours) at 11/25/2022 0855 Last data filed at 11/25/2022 [id number] Gross per 24 hour  Intake 900 ml  Output --  Net 900 ml         Physical Exam: Vital Signs Blood pressure 128/78, pulse 79, temperature 98.1 F (36.7 C), temperature source Oral, resp. rate 19, height '6\' 1"'$  (1.854 m), weight 94.2 kg, SpO2 95 %.      Constitutional: No distress . Vital signs reviewed. HEENT: NCAT, EOMI, oral membranes moist Neck: supple Cardiovascular: RRR without murmur. No JVD    Respiratory/Chest: CTA Bilaterally without wheezes or rales. Normal effort    GI/Abdomen: BS +, non-tender, non-distended Ext: no clubbing, cyanosis, or edema Psych: pleasant and cooperative   Musculoskeletal:       Cervical back: Neck supple.  Neuro: RUE 5/5 in biceps, triceps, WE< grip and FA LUE- biceps 5-/5; triceps 4/5; WE 4/5; FA 2 to 2+/5, FF and FE 2- to 2+/5 RLE- 5/5 in HF, KE, KF DF and PF and EHL LLE- HF 3/5; KE 4-/5; KF 2/5; DF 4-/5, PF 4/5; EHL 4-/5 -  Skin:    General: Skin is warm and dry.     Comments: R handed IV Looks OK  Neurological:     Mental Status: He is alert and oriented to person, place, and time.     Comments: Sensation intact to light touch in all 4 extremities except L C8 distribution is reduced Tone is normal in UE and LE   Assessment/Plan: 1. Functional deficits which require 3+ hours per day of interdisciplinary therapy in a comprehensive inpatient rehab setting. Physiatrist is providing close team supervision and 24 hour management of active medical problems listed below. Physiatrist and rehab team continue to assess barriers to discharge/monitor patient progress toward functional and medical goals  Care Tool:  Bathing    Body parts bathed by patient: Left arm, Chest, Abdomen, Front perineal area, Right upper leg, Left upper leg, Face, Right lower leg   Body parts bathed by helper: Buttocks, Left lower leg, Right arm     Bathing assist Assist Level:  Moderate Assistance - Patient 50 - 74%     Upper Body Dressing/Undressing Upper body dressing   What is the patient wearing?: Pull over shirt    Upper body assist Assist Level: Moderate Assistance - Patient 50 - 74%    Lower Body Dressing/Undressing Lower body dressing      What is the patient wearing?: Pants     Lower body assist Assist for lower body dressing: Maximal Assistance - Patient 25 - 49%     Toileting Toileting    Toileting assist Assist for toileting: Maximal Assistance - Patient 25 - 49%     Transfers Chair/bed transfer  Transfers assist     Chair/bed transfer assist level: Moderate Assistance - Patient 50 - 74%     Locomotion Ambulation   Ambulation assist       Assist level: Moderate Assistance - Patient 50 - 74% Assistive device: Walker-rolling Max distance: 140 ft   Walk 10 feet activity   Assist     Assist level: Moderate Assistance - Patient - 50 - 74% Assistive device: Walker-rolling   Walk 50 feet activity   Assist    Assist level: Moderate Assistance - Patient - 50 - 74% Assistive device: Walker-rolling    Walk 150 feet activity   Assist Walk 150 feet activity did not occur: Safety/medical concerns         Walk 10 feet on uneven surface  activity   Assist Walk 10 feet on uneven surfaces activity did not occur: Safety/medical concerns         Wheelchair     Assist Is the patient using a wheelchair?: Yes (transport only on eval) Type of Wheelchair: Manual    Wheelchair assist level: Dependent - Patient 0%      Wheelchair 50 feet with 2 turns activity    Assist        Assist Level: Dependent - Patient 0%   Wheelchair 150 feet activity     Assist      Assist Level: Dependent - Patient 0%   Blood pressure 128/78, pulse 79, temperature 98.1 F (36.7 C), temperature source Oral, resp. rate 19, height '6\' 1"'$  (1.854 m), weight 94.2 kg, SpO2 95 %.  Medical Problem List and Plan: 1. Functional deficits secondary to Cervical myelopathy with L sided weakness s/p C3-6 ACDF             -patient may  shower- cover incision- will see if therapy can use Estim once assessed. No cancer hx.              -ELOS/Goals: 7-10 days- mod I to supervision  D/c 3/15  -Continue CIR therapies including PT, OT  2.  Antithrombotics: -DVT/anticoagulation:  Mechanical: Sequential compression devices, below knee Bilateral lower extremities             Will see when can start Lovenox 3/4- Lovenox started Friday with OK from [facility]             -antiplatelet therapy: N/A 3. Pain Management: Hydrocodone and flexeril prn for pain   3/4- pain usually controlled- con't regimen- con't soft collar as well.   3/5-  --Fexeril prn for muscle pain.              --gabapentin added BID to help LUE/LLE achy pain.  Pain well controlled- can stop use of soft collar per [facility]  3/6- throat less sore; overall pain controlled- con't regimen  3/7- having intense L neck pain -will do trigger point  injections- meds not helping  3/8- Having to wake up at night to take pain meds- set alarms to do so- says pain skyrockets if doesn't- con't regimen for now  3/9-10 changed to flexeril for trap/shoulder girdle spasm. He feels this is helping and would like to continue. Continue to hold baclofen 4. Mood/Behavior/Sleep: LCSW to follow for evaluation and support.              3/5- mood in good spirits             -antipsychotic agents: N/A 5. Neuropsych/cognition: This patient is capable of making decisions on his own behalf. 6. Skin/Wound Care: Monitor wound for healing.  7. Fluids/Electrolytes/Nutrition: Monitor I/O. Check CMET in am.  8. Renal infarct//hx of CKD/HTN: Monitor BP TID--on metoprolol to keep SBP 90-100 range per wife             --was not taking Cozaar PTA.   3/9-10- BP well controlled- con't regimen 9. GERD: Continue PPI. 10. Labs: Wife reports no labs done pre-op.   --Recheck CMET/CBC in am.   3/4- labs look great- except Albumin of 2.9-  11. Constipation- LBM 3 days ago- not on anything scheduled- will schedule bowel meds- if no results by tomorrow, will do Sorbitol.   3/1- will give Sorbitol if no BM by 5pm.   3/4- had Mg citrate this weekend- had 1 small BM, but no more even with finishing mg citrate yesterday- will check KUB for possible ileus-   3/5- KUB showed small amount of stool- no ileus- did finally have good BM x2 and feels cleaned out.   3/10--had medium bm this morning but still feels full and not "cleaned out"   -re-order mg citrate 12. Multifocal pneumonia  3/5- started on Augmentin per PA- which I agree with- feeling good today   -WBC was 14.5- down to 8.9 after augmentin- will con't  3/6-  educated on Flutter valve and ICS- also has some congestion, but feels better than 2 days ago 13. L neck and L shoulder/anterior/biceps tendinitis pain 3/7- pt needs trigger point injections- will do today at 10 am.    3/8- Neck pain much better- still having L shoulder and L biceps pain- will need to do additional injection next Tuesday- since I'm in clinic Friday/Monday  3/9-10 reports ongoing trap pain today in addition to above.    -flexeril trial as above in #3       LOS: 10 days A FACE TO [place] 11/25/2022, 8:55 AM

## 2022-11-26 LAB — CBC WITH DIFFERENTIAL/PLATELET
Abs Immature Granulocytes: 0.03 10*3/uL (ref 0.00–0.07)
Basophils Absolute: 0 10*3/uL (ref 0.0–0.1)
Basophils Relative: 0 %
Eosinophils Absolute: 0.2 10*3/uL (ref 0.0–0.5)
Eosinophils Relative: 3 %
HCT: 42.4 % (ref 39.0–52.0)
Hemoglobin: 14.4 g/dL (ref 13.0–17.0)
Immature Granulocytes: 0 %
Lymphocytes Relative: 27 %
Lymphs Abs: 1.8 10*3/uL (ref 0.7–4.0)
MCH: 31.9 pg (ref 26.0–34.0)
MCHC: 34 g/dL (ref 30.0–36.0)
MCV: 94 fL (ref 80.0–100.0)
Monocytes Absolute: 0.7 10*3/uL (ref 0.1–1.0)
Monocytes Relative: 10 %
Neutro Abs: 4.1 10*3/uL (ref 1.7–7.7)
Neutrophils Relative %: 60 %
Platelets: 306 10*3/uL (ref 150–400)
RBC: 4.51 MIL/uL (ref 4.22–5.81)
RDW: 11.5 % (ref 11.5–15.5)
WBC: 6.8 10*3/uL (ref 4.0–10.5)
nRBC: 0 % (ref 0.0–0.2)

## 2022-11-26 LAB — BASIC METABOLIC PANEL
Anion gap: 10 (ref 5–15)
BUN: 20 mg/dL (ref 6–20)
CO2: 26 mmol/L (ref 22–32)
Calcium: 9 mg/dL (ref 8.9–10.3)
Chloride: 101 mmol/L (ref 98–111)
Creatinine, Ser: 1.14 mg/dL (ref 0.61–1.24)
GFR, Estimated: >60 mL/min (ref >60)
Glucose, Bld: 110 mg/dL — ABNORMAL HIGH (ref 70–99)
Potassium: 3.9 mmol/L (ref 3.5–5.1)
Sodium: 137 mmol/L (ref 135–145)

## 2022-11-26 NOTE — Progress Notes (Signed)
PROGRESS NOTE   Subjective/Complaints:   Had g citrate yesterday- worked well- had good BM.   Has new gross grasp on LUE/hand Can grasp tennis ball, baseball and lacrosse ball-- working with Rowe Robert- working well  ROS:  Pt denies SOB, abd pain, CP, N/V/C/D, and vision changes    Objective:   No results found. Recent Labs    11/26/22 0614  WBC 6.8  HGB 14.4  HCT 42.4  PLT 306    Recent Labs    11/26/22 0614  NA 137  K 3.9  CL 101  CO2 26  GLUCOSE 110*  BUN 20  CREATININE 1.14  CALCIUM 9.0     Intake/Output Summary (Last 24 hours) at 11/26/2022 0839 Last data filed at 11/25/2022 2100 Gross per 24 hour  Intake 1080 ml  Output --  Net 1080 ml         Physical Exam: Vital Signs Blood pressure 128/86, pulse 79, temperature 98 F (36.7 C), temperature source Oral, resp. rate 16, height '6\' 1"'$  (1.854 m), weight 94.2 kg, SpO2 95 %.       General: awake, alert, appropriate,sitting on mat in dayroom with OT- has Saebo on L hand; NAD HENT: conjugate gaze; oropharynx moist CV: regular rate; no JVD Pulmonary: CTA B/L; no W/R/R- good air movement GI: soft, NT, ND, (+)BS Psychiatric: appropriate Neurological: Ox3  Ext: no clubbing, cyanosis, or edema Psych: pleasant and cooperative   Musculoskeletal: Has NEW L hand grasp - hard ot tell since using Saebo, but at least 2-/5     Cervical back: Neck supple.  Neuro: RUE 5/5 in biceps, triceps, WE< grip and FA LUE- biceps 5-/5; triceps 4/5; WE 4/5; FA 2 to 2+/5, FF and FE 2- to 2+/5 RLE- 5/5 in HF, KE, KF DF and PF and EHL LLE- HF 3/5; KE 4-/5; KF 2/5; DF 4-/5, PF 4/5; EHL 4-/5 -  Skin:    General: Skin is warm and dry.     Comments: R handed IV Looks OK  Neurological:     Mental Status: He is alert and oriented to person, place, and time.     Comments: Sensation intact to light touch in all 4 extremities except L C8 distribution is reduced Tone is  normal in UE and LE   Assessment/Plan: 1. Functional deficits which require 3+ hours per day of interdisciplinary therapy in a comprehensive inpatient rehab setting. Physiatrist is providing close team supervision and 24 hour management of active medical problems listed below. Physiatrist and rehab team continue to assess barriers to discharge/monitor patient progress toward functional and medical goals  Care Tool:  Bathing    Body parts bathed by patient: Left arm, Chest, Abdomen, Front perineal area, Right upper leg, Left upper leg, Face, Right lower leg   Body parts bathed by helper: Buttocks, Left lower leg, Right arm     Bathing assist Assist Level: Moderate Assistance - Patient 50 - 74%     Upper Body Dressing/Undressing Upper body dressing   What is the patient wearing?: Pull over shirt    Upper body assist Assist Level: Moderate Assistance - Patient 50 - 74%    Lower Body Dressing/Undressing Lower  body dressing      What is the patient wearing?: Pants     Lower body assist Assist for lower body dressing: Maximal Assistance - Patient 25 - 49%     Toileting Toileting    Toileting assist Assist for toileting: Maximal Assistance - Patient 25 - 49%     Transfers Chair/bed transfer  Transfers assist     Chair/bed transfer assist level: Moderate Assistance - Patient 50 - 74%     Locomotion Ambulation   Ambulation assist      Assist level: Moderate Assistance - Patient 50 - 74% Assistive device: Walker-rolling Max distance: 140 ft   Walk 10 feet activity   Assist     Assist level: Moderate Assistance - Patient - 50 - 74% Assistive device: Walker-rolling   Walk 50 feet activity   Assist    Assist level: Moderate Assistance - Patient - 50 - 74% Assistive device: Walker-rolling    Walk 150 feet activity   Assist Walk 150 feet activity did not occur: Safety/medical concerns         Walk 10 feet on uneven surface   activity   Assist Walk 10 feet on uneven surfaces activity did not occur: Safety/medical concerns         Wheelchair     Assist Is the patient using a wheelchair?: Yes (transport only on eval) Type of Wheelchair: Manual    Wheelchair assist level: Dependent - Patient 0%      Wheelchair 50 feet with 2 turns activity    Assist        Assist Level: Dependent - Patient 0%   Wheelchair 150 feet activity     Assist      Assist Level: Dependent - Patient 0%   Blood pressure 128/86, pulse 79, temperature 98 F (36.7 C), temperature source Oral, resp. rate 16, height '6\' 1"'$  (1.854 m), weight 94.2 kg, SpO2 95 %.  Medical Problem List and Plan: 1. Functional deficits secondary to Cervical myelopathy with L sided weakness s/p C3-6 ACDF             -patient may  shower- cover incision- will see if therapy can use Estim once assessed. No cancer hx.              -ELOS/Goals: 7-10 days- mod I to supervision  D/c 3/15  Con't CIR PT and OT-  Getting some gross grip and thumb movement in L hand 2.  Antithrombotics: -DVT/anticoagulation:  Mechanical: Sequential compression devices, below knee Bilateral lower extremities             Will see when can start Lovenox 3/4- Lovenox started Friday with OK from [facility]             -antiplatelet therapy: N/A 3. Pain Management: Hydrocodone and flexeril prn for pain   3/4- pain usually controlled- con't regimen- con't soft collar as well.   3/5- --Fexeril prn for muscle pain.              --gabapentin added BID to help LUE/LLE achy pain.  Pain well controlled- can stop use of soft collar per NSU  3/6- throat less sore; overall pain controlled- con't regimen  3/7- having intense L neck pain -will do trigger point injections- meds not helping  3/8- Having to wake up at night to take pain meds- set alarms to do so- says pain skyrockets if doesn't- con't regimen for now  3/9-10 changed to flexeril for trap/shoulder girdle spasm. He feels  this is helping and would like to continue. Continue to hold baclofen  3/11- still wants injections due to pain- will do tomorrow 4. Mood/Behavior/Sleep: LCSW to follow for evaluation and support.              3/5- mood in good spirits             -antipsychotic agents: N/A 5. Neuropsych/cognition: This patient is capable of making decisions on his own behalf. 6. Skin/Wound Care: Monitor wound for healing.  7. Fluids/Electrolytes/Nutrition: Monitor I/O. Check CMET in am.  8. Renal infarct//hx of CKD/HTN: Monitor BP TID--on metoprolol to keep SBP 90-100 range per wife             --was not taking Cozaar PTA.   3/9-10- BP well controlled- con't regimen 9. GERD: Continue PPI. 10. Labs: Wife reports no labs done pre-op.   --Recheck CMET/CBC in am.   3/4- labs look great- except Albumin of 2.9-  11. Constipation- LBM 3 days ago- not on anything scheduled- will schedule bowel meds- if no results by tomorrow, will do Sorbitol.   3/1- will give Sorbitol if no BM by 5pm.   3/4- had Mg citrate this weekend- had 1 small BM, but no more even with finishing mg citrate yesterday- will check KUB for possible ileus-   3/5- KUB showed small amount of stool- no ileus- did finally have good BM x2 and feels cleaned out.   3/10--had medium bm this morning but still feels full and not "cleaned out"   -re-order mg citrate  3/11- Was "cleaned out with mg citrate" 12. Multifocal pneumonia  3/5- started on Augmentin per PA- which I agree with- feeling good today   -WBC was 14.5- down to 8.9 after augmentin- will con't  3/6- educated on Flutter valve and ICS- also has some congestion, but feels better than 2 days ago 13. L neck and L shoulder/anterior/biceps tendinitis pain 3/7- pt needs trigger point injections- will do today at 10 am.    3/8- Neck pain much better- still having L shoulder and L biceps pain- will need to do additional injection next Tuesday- since I'm in clinic Friday/Monday  3/9-10 reports  ongoing trap pain today in addition to above.    -flexeril trial as above in #3  3/11- will do TrP injections as well as L biceps tendinitis steroid injection on Tuesday     I spent a total of 38    minutes on total care today- >50% coordination of care- due to  D/w OTA about Saebo and recovery and with pt- also educating pt on injections will be doing and plan for them  LOS: 11 days A FACE TO FACE EVALUATION WAS PERFORMED  Wannetta Langland 11/26/2022, 8:39 AM

## 2022-11-26 NOTE — Progress Notes (Signed)
Occupational Therapy Session Note  Patient Details  Name: Johnny Robinson MRN: [id number] Date of Birth: [birth year]  Today's Date: 11/26/2022 OT Individual Time: 0700-0810 OT Individual Time Calculation (min): 70 min    Short Term Goals: Week 2:  OT Short Term Goal 1 (Week 2): STG=LTG 2/2 ELOS  Skilled Therapeutic Interventions/Progress Updates:    Pt resting in recliner upon arrival. All amb during therapy with SBQC with occasional CGA. Pt amb to day room. 7 mins NuStep level 6. Standing balance on Airex for cornhole. Standing balance for corn hole and stepping forward with LLE.Pt retrieved items from floor with reacher and squatting. CGA. Saebo use for thumb adduction to facilitate improved grasp. Pt with weak gross grasp wihtout use of Saebo. Pt amb to ortho gym. 5 mins on Sci[employer]. Pt able to grasp Lt handle with weak grasp. Pt returned to room and sat in recliner. Pt able to use gross grasp to pick up/release tennis ball, baseball, and lacrosse ball. Pt remained in w/c with all needs withn reach.  Therapy Documentation Precautions:  Precautions Precautions: Fall, Cervical Precaution Comments: reviewed cervical precautions Required Braces or Orthoses: Cervical Brace Cervical Brace: Soft collar Restrictions Weight Bearing Restrictions: No Pain:  Pt reports his pain is "ok" and he received pain meds ~2 hrs prior to threapy   Therapy/Group: Individual Therapy  [name] 11/26/2022, 8:16 AM

## 2022-11-26 NOTE — Progress Notes (Signed)
Physical Therapy Weekly Progress Note  Patient Details  Name: Johnny Robinson MRN: [id number] Date of Birth: [birth year]  Beginning of progress report period: November 16, 2022 End of progress report period: November 26, 2022  Today's Date: 11/26/2022 PT Individual Time: [id number], 1400-1445 PT Individual Time Calculation (min): 70 min, 45 min   Patient has met 3 of 3 short term goals.  Pt is progressing well toward LTGs. He is ambulating with a quad cane and no LE bracing. Noted impairments in L hip abduction, hamstring, hip flexor, and DF strength. Pt is continuing to progress toward mod I goals, but is limited by LUE strength.   Patient continues to demonstrate the following deficits muscle weakness, impaired timing and sequencing, unbalanced muscle activation, decreased coordination, and decreased motor planning, and decreased standing balance, decreased postural control, decreased balance strategies, and difficulty maintaining precautions and therefore will continue to benefit from skilled PT intervention to increase functional independence with mobility.  Patient progressing toward long term goals..  Continue plan of care.  PT Short Term Goals Week 1:  PT Short Term Goal 1 (Week 1): Pt will ambulate 150 ft with min A PT Short Term Goal 1 - Progress (Week 1): Met PT Short Term Goal 2 (Week 1): Pt will ambulate 100 ft with no incidence of knee buckling PT Short Term Goal 2 - Progress (Week 1): Met PT Short Term Goal 3 (Week 1): Pt will perform standing activities with short bouts of UUE support PT Short Term Goal 3 - Progress (Week 1): Met Week 2:  PT Short Term Goal 1 (Week 2): =LTGs d/t ELOS  Skilled Therapeutic Interventions/Progress Updates:    Session 1: Pt received in recliner and agreeable to therapy.  No complaint of pain. Pt ambulated with quad cane and CGA throughout session. Note improving hip flexion strength, now most notably limited by hamstring and hip abduction strength and DF  endurance. Pt reports concern about stepping over shower ledge. Performed variations of stepping onto and over 7" obstacles to mimic ledge. Pt also performed laterally, which will likely be safest option. Pt also performed standing hamstring curls with 10 lb ankle weight on RLE for increased challenge. Transitioned to quadruped bird dogs for UE strength and endurance, cues for scapular protraction in quadruped. Pt then ambulated x 12 min for endurance with noted improvements in gait mechanics, but flexed knee and increased foot slap with fatigue. Pt returned to room and to recliner, was left with all needs in reach.  Session 2: Pt received in recliner and agreeable to therapy.  No complaint of pain. Pt ambulated with quad cane and CGA throughout session. Session focused on balance and stability training in sock feet for improved intrinsic foot muscle and ankle control. Pt utilized biodex with stability at level 7-10 for increased balance challenge and ankle ROM. Progressed to rocker board for ankle DF/PF strength. Attempted single balance  with UUE support with moderate success. Pt also performed step ups and lunges onto bosu with LLE, forward, laterally, and backwards. Pt returned to room and remained in recliner, with all needs in reach.   Therapy Documentation Precautions:  Precautions Precautions: Fall, Cervical Precaution Comments: reviewed cervical precautions Required Braces or Orthoses: Cervical Brace Cervical Brace: Soft collar Restrictions Weight Bearing Restrictions: No General:     Therapy/Group: Individual Therapy  Mickel Fuchs 11/26/2022, 12:54 PM

## 2022-11-27 MED ORDER — POLYETHYLENE GLYCOL 3350 17 G PO PACK
17.0000 g | PACK | Freq: Every day | ORAL | Status: DC
  Administered 2022-11-27 – 2022-11-30 (×4): 17 g via ORAL
  Filled 2022-11-27 (×4): qty 1

## 2022-11-27 MED ORDER — LIDOCAINE HCL (PF) 1 % IJ SOLN
5.0000 mL | Freq: Once | INTRAMUSCULAR | Status: AC
  Administered 2022-11-27: 5 mL via INTRADERMAL
  Filled 2022-11-27: qty 30

## 2022-11-27 MED ORDER — TRIAMCINOLONE ACETONIDE 40 MG/ML IJ SUSP
40.0000 mg | Freq: Once | INTRAMUSCULAR | Status: AC
  Administered 2022-11-27: 40 mg via INTRAMUSCULAR
  Filled 2022-11-27: qty 1

## 2022-11-27 NOTE — Progress Notes (Signed)
Patient ID: Johnny Robinson, male   DOB: 1966-03-12, 57 y.o.   MRN: [id number]  1220-SW went by room. Pt not present. SW will follow-up to provide updates.   33- SW left message for pt wife [name] to provide updates from team conference, and d/c date remains 3/15. Recommendation for outpatient PT/OT and quad cane. SW informed will discuss above with her husband.   *SW met with pt in room to discuss above. He prefers [place] location for outpatient therapy. Would like an Rx for saebo he purchased. Also aware SW will order quad cane. Requests handicap placard.   SW ordered DME with [employer] via parachute.   Loralee Pacas, MSW, [place] Office: [phone] Cell: [phone] Fax: [phone]

## 2022-11-27 NOTE — Progress Notes (Signed)
Occupational Therapy Session Note  Patient Details  Name: Johnny Robinson MRN: [id number] Date of Birth: [birth year]  Today's Date: 11/27/2022 OT Individual Time: 1000-1055 OT Individual Time Calculation (min): 55 min    Short Term Goals: Week 2:  OT Short Term Goal 1 (Week 2): STG=LTG 2/2 ELOS  Skilled Therapeutic Interventions/Progress Updates:    Pt resting in recliner upon arrival with wife present for education Pt amb with SBQC to gym and practiced walk in shower transfers with side steps into/out of shower. Pt able to clear LLE ~8" to clear ledge. Recommended pt/wife practice at home before taking shower. Recommended nonslip strips for shower. Pt amb with SBQC to day room. Demonstrated application of Saebo for finger flexion, finger extension, and thumb addution. Pt's return demonstrated application. Pt returned to room. Pt remained in recliner with all needs within reach. Wife present.   Therapy Documentation Precautions:  Precautions Precautions: Fall, Cervical Precaution Comments: reviewed cervical precautions Required Braces or Orthoses: Cervical Brace Cervical Brace: Soft collar Restrictions Weight Bearing Restrictions: No  Pain: Pt reports LUE shoulder pain; MD aware   Therapy/Group: Individual Therapy  [name] 11/27/2022, 11:04 AM

## 2022-11-27 NOTE — Patient Care Conference (Signed)
Inpatient RehabilitationTeam Conference and Plan of Care Update Date: 11/28/2022   Time: 11:04 AM    Patient Name: Johnny Robinson      Medical Record Number: [id number]  Date of Birth: Dec 19, 1965 Sex: Male         Room/Bed: 4M12C/4M12C-01 Payor Info: Payor: [employer] / Plan: [place] [employer] PPO / Product Type: *No Product type* /    Admit Date/Time:  11/15/2022  4:54 PM  Primary Diagnosis:  HNP (herniated nucleus pulposus) with myelopathy, cervical  Hospital Problems: Principal Problem:   HNP (herniated nucleus pulposus) with myelopathy, cervical Active Problems:   Chronic pain syndrome    Expected Discharge Date: Expected Discharge Date: 11/30/22  Team Members Present: Physician leading conference: Dr. Courtney Heys Social Worker Present: Loralee Pacas, [place] Nurse Present: Tacy Learn, RN PT Present: Ailene Rud, PT OT Present: Willeen Cass, OT;Roanna Epley, COTA PPS Coordinator present : Gunnar Fusi, SLP     Current Status/Progress Goal Weekly Team Focus  Bowel/Bladder   Patient is continent of bowel and bladder   Remain continent of bladder and bowel   Assess toileting and normal patterns with Bowels QS/PRN    Swallow/Nutrition/ Hydration               ADL's   bathing-supervision; UB dressing-supervision-; LB dressing-mod A; transfers-supervision/CGA; improving gross grasp but not functional yet   mod I overall   education, LUE NMR, BADLs    Mobility   superivison bed mobility, supervision STS and transfers, CGA gait with quad cane,   supervision transfers CGA gait, min A stairs  hip flexor and hamstring strength, endurance, gait mechanics.    Communication                Safety/Cognition/ Behavioral Observations               Pain   Patient rate pain left shoulder/ and lower back, 7-8/10, continue Norco and flexeril as needed, utilize K-PAD as neeed   pain goal 3/10   Assess pain prior to and after therapy sessions and  administer medications, or rest times as needed    Skin   S/P incision site healing well with no signs of irritation,redness,or drainage   Prevention of infection, promoting  incision healing to surgical site  Assess skin QQS/PRN monitor for incisional changes and educate family/patient      Discharge Planning:  Pt will d/c to home with his wife who will provide intermittent support during the day since she will be working from home; support in the evening from their 55 y.o. son. No concerns about 24/7 supervision. Fam edu scheduled on Tuesday (3/12) 9am-12pm with his wife.  SW will confirm there are no barriers to discharge.   Team Discussion: HNP with cervical myelopathy. Continent B/B. 7/10 neck pain managed with PRN medications. Trigger point injections done. Family education completed. Sabo education completed. CGA gait with quad cane.  Patient on target to meet rehab goals: yes, patient on track to meet goals  *See Care Plan and progress notes for long and short-term goals.   Revisions to Treatment Plan:  NA  Teaching Needs: Medications, safety, self care, gait/transfer training, skin care, etc.   Current Barriers to Discharge: Decreased caregiver support and Wound care  Possible Resolutions to Barriers: Family education completed, nursing education, order recommended DME     Medical Summary Current Status: constipated is improved- but will add Miralax since requiring Mg citrate- peeing better on Flomax- pain 7/10- needs biceps  tendinitis and trigger point injections today  Barriers to Discharge: Medical stability;Self-care education;Weight bearing restrictions;Spasticity  Barriers to Discharge Comments: family education today- learnign Saebo for LUE Possible Resolutions to [employer]: on target to meet goals- main barrier is pain and LUE weakness- doing injections today to help pain control- using quad cane to walk- just needs quad cane- d/c 3/15   Continued  Need for Acute Rehabilitation Level of Care: The patient requires daily medical management by a physician with specialized training in physical medicine and rehabilitation for the following reasons: Direction of a multidisciplinary physical rehabilitation program to maximize functional independence : Yes Medical management of patient stability for increased activity during participation in an intensive rehabilitation regime.: Yes Analysis of laboratory values and/or radiology reports with any subsequent need for medication adjustment and/or medical intervention. : Yes   I attest that I was present, lead the team conference, and concur with the assessment and plan of the team.   Ernest Pine 11/28/2022, 9:07 AM

## 2022-11-27 NOTE — Progress Notes (Signed)
Physical Therapy Session Note  Patient Details  Name: Johnny Robinson MRN: [id number] Date of Birth: [birth year]  Today's Date: 11/27/2022 PT Individual Time: 0915-1000 PT Individual Time Calculation (min): 45 min   Short Term Goals: Week 2:  PT Short Term Goal 1 (Week 2): =LTGs d/t ELOS  Skilled Therapeutic Interventions/Progress Updates:    pt received in recliner and agreeable to therapy. No complaint of pain. Session focused on family education with pt's wife, [name]. Pt ambulated throughout session with close supervision and quad cane. Cues for upright posture. Noted gait impairments with fatigue including low foot clearance, knee maintained in flexion in stance, foot slap, and hip hiking. One near LOB while turning, discussed using wider turns to prevent LOB.   Pt navigated car with supervision assist at sedan height without difficulty. Pt navigated ramp, mulch and curb with CGA/supervision. Discussed providing CGA on difficult terrain or when fatigued.  Pt performed floor transfer x 3 with light CGA, with both therapist and wife. Discussed safety and when to activate EMS, as well as fall prevention strategies in the home.   Pt navigated 2 x 2 flights of steps without rest break with quad cane and CGA. Provided education on safe guarding on stairs. Pt demoes improved L hip flexion while placing foot on stair. Pt then ambulated x 13 min over level surfaces in the manner described above. Discussed gait impairments to watch for with fatigue and when pt might require assist.   Returned to room and answered pt and family questions as able, including directing questions to OT and CSW as appropriate.   Therapy Documentation Precautions:  Precautions Precautions: Fall, Cervical Precaution Comments: reviewed cervical precautions Required Braces or Orthoses: Cervical Brace Cervical Brace: Soft collar Restrictions Weight Bearing Restrictions: No General:       Therapy/Group: Individual  Therapy  Mickel Fuchs 11/27/2022, 10:28 AM

## 2022-11-27 NOTE — Progress Notes (Signed)
Occupational Therapy Session Note  Patient Details  Name: Johnny Robinson MRN: [id number] Date of Birth: 1965/10/08  Today's Date: 11/27/2022 OT Individual Time: 1345-1430 OT Individual Time Calculation (min): 45 min    Short Term Goals: Week 2:  OT Short Term Goal 1 (Week 2): STG=LTG 2/2 ELOS  Skilled Therapeutic Interventions/Progress Updates:    OT intervention with focus on LUE gross grasp and thumb opposition to facilitate pincher grasp. Pt issued tan theraputty and completed activities for intrinsic strengthening and thumb opposition. Pt issued small foam blocks to pick up and place in container. Pt remained seated in chair in day room awaiting handoff to PT. Pt with improved thumb opposition and weak pincher grasp.  Therapy Documentation Precautions:  Precautions Precautions: Fall, Cervical Precaution Comments: reviewed cervical precautions Required Braces or Orthoses: Cervical Brace Cervical Brace: Soft collar Restrictions Weight Bearing Restrictions: No  Pain: Pt reports LUE fatigue   Therapy/Group: Individual Therapy  [name] 11/27/2022, 2:40 PM

## 2022-11-27 NOTE — Progress Notes (Signed)
PROGRESS NOTE   Subjective/Complaints:   Had another good BM Monday afternoon- thinks due to Mg citrate Will add Miralax due to lack of bowel meds regularly, and needs since taking pain meds so much  Has bought Saebo estim device- not glove- will have Gershon Mussel show how to use  Walking with quad cane now  ROS:   Pt denies SOB, abd pain, CP, N/V/C/D, and vision changes Except for HPI    Objective:   No results found. Recent Labs    11/26/22 0614  WBC 6.8  HGB 14.4  HCT 42.4  PLT 306    Recent Labs    11/26/22 0614  NA 137  K 3.9  CL 101  CO2 26  GLUCOSE 110*  BUN 20  CREATININE 1.14  CALCIUM 9.0     Intake/Output Summary (Last 24 hours) at 11/27/2022 1848 Last data filed at 11/27/2022 1829 Gross per 24 hour  Intake 480 ml  Output --  Net 480 ml         Physical Exam: Vital Signs Blood pressure 127/88, pulse 84, temperature 98 F (36.7 C), temperature source Oral, resp. rate 16, height '6\' 1"'$  (1.854 m), weight 94.2 kg, SpO2 99 %.       General: awake, alert, appropriate, sitting up on mat in dayroom with OTA; also seen with PT in dayroom as well; NAD HENT: conjugate gaze; oropharynx moist CV: regular rate; no JVD Pulmonary: CTA B/L; no W/R/R- good air movement GI: soft, NT, ND, (+)BS Psychiatric: appropriate Neurological: Ox3   Ext: no clubbing, cyanosis, or edema Psych: pleasant and cooperative   Musculoskeletal: Has NEW L hand grasp - hard ot tell since using Saebo, but at least 2-/5     Cervical back: Neck supple.  Neuro: RUE 5/5 in biceps, triceps, WE< grip and FA LUE- biceps 5-/5; triceps 4/5; WE 4/5; FA 2 to 2+/5, FF and FE 2- to 2+/5 RLE- 5/5 in HF, KE, KF DF and PF and EHL LLE- HF 3/5; KE 4-/5; KF 2/5; DF 4-/5, PF 4/5; EHL 4-/5 -  Skin:    General: Skin is warm and dry.     Comments: R handed IV Looks OK  Neurological:     Mental Status: He is alert and oriented to person,  place, and time.     Comments: Sensation intact to light touch in all 4 extremities except L C8 distribution is reduced Tone is normal in UE and LE   Assessment/Plan: 1. Functional deficits which require 3+ hours per day of interdisciplinary therapy in a comprehensive inpatient rehab setting. Physiatrist is providing close team supervision and 24 hour management of active medical problems listed below. Physiatrist and rehab team continue to assess barriers to discharge/monitor patient progress toward functional and medical goals  Care Tool:  Bathing    Body parts bathed by patient: Left arm, Chest, Abdomen, Front perineal area, Right upper leg, Left upper leg, Face, Right lower leg   Body parts bathed by helper: Buttocks, Left lower leg, Right arm     Bathing assist Assist Level: Moderate Assistance - Patient 50 - 74%     Upper Body Dressing/Undressing Upper body dressing  What is the patient wearing?: Pull over shirt    Upper body assist Assist Level: Moderate Assistance - Patient 50 - 74%    Lower Body Dressing/Undressing Lower body dressing      What is the patient wearing?: Pants     Lower body assist Assist for lower body dressing: Maximal Assistance - Patient 25 - 49%     Toileting Toileting    Toileting assist Assist for toileting: Maximal Assistance - Patient 25 - 49%     Transfers Chair/bed transfer  Transfers assist     Chair/bed transfer assist level: Moderate Assistance - Patient 50 - 74%     Locomotion Ambulation   Ambulation assist      Assist level: Moderate Assistance - Patient 50 - 74% Assistive device: Walker-rolling Max distance: 140 ft   Walk 10 feet activity   Assist     Assist level: Moderate Assistance - Patient - 50 - 74% Assistive device: Walker-rolling   Walk 50 feet activity   Assist    Assist level: Moderate Assistance - Patient - 50 - 74% Assistive device: Walker-rolling    Walk 150 feet  activity   Assist Walk 150 feet activity did not occur: Safety/medical concerns         Walk 10 feet on uneven surface  activity   Assist Walk 10 feet on uneven surfaces activity did not occur: Safety/medical concerns         Wheelchair     Assist Is the patient using a wheelchair?: Yes (transport only on eval) Type of Wheelchair: Manual    Wheelchair assist level: Dependent - Patient 0%      Wheelchair 50 feet with 2 turns activity    Assist        Assist Level: Dependent - Patient 0%   Wheelchair 150 feet activity     Assist      Assist Level: Dependent - Patient 0%   Blood pressure 127/88, pulse 84, temperature 98 F (36.7 C), temperature source Oral, resp. rate 16, height '6\' 1"'$  (1.854 m), weight 94.2 kg, SpO2 99 %.  Medical Problem List and Plan: 1. Functional deficits secondary to Cervical myelopathy with L sided weakness s/p C3-6 ACDF             -patient may  shower- cover incision- will see if therapy can use Estim once assessed. No cancer hx.              -ELOS/Goals: 7-10 days- mod I to supervision  D/c 3/15  Con't CIR PT and OT- team conference today to finalize d/c- family education done even on Saebo estim- which pt bought 2.  Antithrombotics: -DVT/anticoagulation:  Mechanical: Sequential compression devices, below knee Bilateral lower extremities             Will see when can start Lovenox 3/4- Lovenox started Friday with OK from [facility]             -antiplatelet therapy: N/A 3. Pain Management: Hydrocodone and flexeril prn for pain   3/4- pain usually controlled- con't regimen- con't soft collar as well.   3/5- --Fexeril prn for muscle pain.              --gabapentin added BID to help LUE/LLE achy pain.  Pain well controlled- can stop use of soft collar per [facility]  3/6- throat less sore; overall pain controlled- con't regimen  3/7- having intense L neck pain -will do trigger point injections- meds not helping  3/8-  Having to wake up at  night to take pain meds- set alarms to do so- says pain skyrockets if doesn't- con't regimen for now  3/9-10 changed to flexeril for trap/shoulder girdle spasm. He feels this is helping and would like to continue. Continue to hold baclofen  3/11- still wants injections due to pain- will do tomorrow  3/12- done today 4. Mood/Behavior/Sleep: LCSW to follow for evaluation and support.              3/5- mood in good spirits             -antipsychotic agents: N/A 5. Neuropsych/cognition: This patient is capable of making decisions on his own behalf. 6. Skin/Wound Care: Monitor wound for healing.  7. Fluids/Electrolytes/Nutrition: Monitor I/O. Check CMET in am.  8. Renal infarct//hx of CKD/HTN: Monitor BP TID--on metoprolol to keep SBP 90-100 range per wife             --was not taking Cozaar PTA.   3/9-10- BP well controlled- con't regimen 9. GERD: Continue PPI. 10. Labs: Wife reports no labs done pre-op.   --Recheck CMET/CBC in am.   3/4- labs look great- except Albumin of 2.9-  11. Constipation- LBM 3 days ago- not on anything scheduled- will schedule bowel meds- if no results by tomorrow, will do Sorbitol.   3/1- will give Sorbitol if no BM by 5pm.   3/4- had Mg citrate this weekend- had 1 small BM, but no more even with finishing mg citrate yesterday- will check KUB for possible ileus-   3/5- KUB showed small amount of stool- no ileus- did finally have good BM x2 and feels cleaned out.   3/10--had medium bm this morning but still feels full and not "cleaned out"   -re-order mg citrate  3/11- Was "cleaned out with mg citrate"  3/12 - will add Miralax so doesn't need Mg citrate, hopefully 12. Multifocal pneumonia  3/5- started on Augmentin per PA- which I agree with- feeling good today   -WBC was 14.5- down to 8.9 after augmentin- will con't  3/6- educated on Flutter valve and ICS- also has some congestion, but feels better than 2 days ago 13. L neck and L shoulder/anterior/biceps  tendinitis pain 3/7- pt needs trigger point injections- will do today at 10 am.    3/8- Neck pain much better- still having L shoulder and L biceps pain- will need to do additional injection next Tuesday- since I'm in clinic Friday/Monday  3/9-10 reports ongoing trap pain today in addition to above.    -flexeril trial as above in #3  3/11- will do TrP injections as well as L biceps tendinitis steroid injection on Tuesday  3.12 - done today   Patient here for trigger point injections for  Consent done and on chart.  Cleaned areas with alcohol and injected using a 27 gauge 1.5 inch needle  Injected 3cc- no wastage Using 1% Lidocaine with no EPI  Upper traps= L x2 Levators Posterior scalenes Middle scalenes Splenius Capitus Pectoralis Major Rhomboids Infraspinatus Teres Major/minor Thoracic paraspinals Lumbar paraspinals Other injections- L biceps and L triceps   Patient's level of pain prior was Current level of pain after injections is  There was no bleeding or complications.  Patient was advised to drink a lot of water on day after injections to flush system Will have increased soreness for 12-48 hours after injections.  Can use Lidocaine patches the day AFTER injections Can use theracane on day of injections in places  didn't inject Can use heating pad 4-6 hours AFTER injections   steroid injection was performed at L bicipital tendons origin using 1% plain Lidocaine and '40mg'$  /1cc of Kenalog. This was well tolerated.  Cleaned with betadine x3 and allowed to dry- then alcohol then injected using 27 gauge 1.5 inch needle- no bleeding or complications.    F/U in 3 months for steroid injections of L biceps tendons prn Lidocaine will kick in 15 minutes- and wear off tonight- the steroid will kick in tomorrow within 24 hours and take up to 72 hours to fully kick in.      I spent a total of  56  minutes on total care today- >50% coordination of care- due to  L bicipital  tendonitis steroid injection and Trigger point injections of LUE/L shoulder Also did team conference to finalize d/c.    LOS: 12 days A FACE TO FACE EVALUATION WAS PERFORMED  Darroll Bredeson 11/27/2022, 6:48 PM

## 2022-11-27 NOTE — Progress Notes (Signed)
Physical Therapy Session Note  Patient Details  Name: Johnny Robinson MRN: [id number] Date of Birth: 03/20/1966  Today's Date: 11/27/2022 PT Individual Time: 1445-1530 PT Individual Time Calculation (min): 45 min   Short Term Goals: Week 2:  PT Short Term Goal 1 (Week 2): =LTGs d/t ELOS  Skilled Therapeutic Interventions/Progress Updates:    Pt received in day room as hand off from OT and agreeable to therapy.  No complaint of pain. Pt ambulated back to room with quad cane and supervision to receive injections from MD. Pt demoes improving hip abduction during gait, but still slightly internally rotated during swing/heel strike. After receiving injections, pt participated in gait training, including ambulating forward and back ward with and without hand rail with CGA. Reaction training with pt following commands to change directions, stop, etc with CGA, including side stepping. Cues for increased foot clearance in all directions. During seated rest break, performed hamstring curls with scooter board for to isolate hamstrings. Pt able to utilize without resistance but not with theraband resistance. Hamstrings found to fatigue very quickly in this manner. Also performed internal/external rotation with scooterboard for improved proprioception and control of internal rotation during gait, which was improved following. Pt then navigated 2 flights of stairs with alternating pattern and R hand rail. Min A provided for step up/down with LLE for safety. Pt returned to room after room and was left with all needs in reach and alarm active.      Therapy Documentation Precautions:  Precautions Precautions: Fall, Cervical Precaution Comments: reviewed cervical precautions Required Braces or Orthoses: Cervical Brace Cervical Brace: Soft collar Restrictions Weight Bearing Restrictions: No General:     Therapy/Group: Individual Therapy  Mickel Fuchs 11/27/2022, 2:09 PM

## 2022-11-27 NOTE — Discharge Summary (Signed)
Physician Discharge Summary  Patient ID: Johnny Robinson MRN: [id number] DOB/AGE: 1966/08/28 57 y.o.  Admit date: 11/15/2022 Discharge date: 11/30/2022  Discharge Diagnoses:  Principal Problem:   HNP (herniated nucleus pulposus) with myelopathy, cervical Active Problems:   Chronic pain syndrome   Left shoulder pain   Urinary hesitancy   Constipation   Biceps tendinitis of left shoulder   Multifocal pneumonia   Discharged Condition: stable  Significant Diagnostic Studies: DG Cervical Spine 2 or 3 views  Result Date: 11/23/2022 CLINICAL DATA:  Neck pain EXAM: CERVICAL SPINE - 2-3 VIEW COMPARISON:  MRI cervical spine 11/14/2022. FINDINGS: Prior C3-C6 ACDF. Hardware is intact without evidence of loosening. There is mild degenerative disc disease at C6-C7. There is mild to moderate multilevel facet arthropathy. Persistent prevertebral soft tissue swelling. IMPRESSION: Prior ACDF from C3-C6. Intact hardware without evidence of loosening. Persistent prevertebral soft tissue swelling. Mild degenerative disc disease at C6-C7. Mild to moderate multilevel facet arthropathy. Electronically Signed   By: Caprice Renshaw M.D.   On: 11/23/2022 12:39   DG Chest 1 View  Result Date: 11/19/2022 CLINICAL DATA:  Cough. EXAM: CHEST  1 VIEW COMPARISON:  05/30/2015. FINDINGS: Patchy opacities in the lower lungs, suspicious for multifocal pneumonia. Linear atelectasis or scarring in the left lung base. No pleural effusion or pneumothorax. Normal heart size and mediastinal contours. IMPRESSION: Patchy opacities in the lower lungs, suspicious for multifocal pneumonia. Electronically Signed   By: Orvan Falconer M.D.   On: 11/19/2022 14:55   DG Abd 1 View  Result Date: 11/19/2022 CLINICAL DATA:  Constipation EXAM: ABDOMEN - 1 VIEW COMPARISON:  07/26/2020 FINDINGS: Mild gaseous distension of the colon. No abnormally dilated loops of small bowel. No gross free intraperitoneal air. No radio-opaque calculi or other significant  radiographic abnormality are seen. IMPRESSION: Nonobstructive bowel gas pattern with mild gaseous distension of the colon. Electronically Signed   By: Duanne Guess D.O.   On: 11/19/2022 10:41    Labs:  Basic Metabolic Panel:    Latest Ref Rng & Units 11/26/2022    6:14 AM 11/20/2022    7:23 AM 11/19/2022    8:23 AM  BMP  Glucose 70 - 99 mg/dL 409  811  914   BUN 6 - 20 mg/dL 20  19  23    Creatinine 0.61 - 1.24 mg/dL 7.82  9.56  2.13   Sodium 135 - 145 mmol/L 137  135  132   Potassium 3.5 - 5.1 mmol/L 3.9  3.9  3.9   Chloride 98 - 111 mmol/L 101  97  97   CO2 22 - 32 mmol/L 26  27  25    Calcium 8.9 - 10.3 mg/dL 9.0  8.7  8.7      CBC:    Latest Ref Rng & Units 11/26/2022    6:14 AM 11/20/2022    7:23 AM 11/19/2022    8:23 AM  CBC  WBC 4.0 - 10.5 K/uL 6.8  8.9  14.3   Hemoglobin 13.0 - 17.0 g/dL 08.6  57.8  46.9   Hematocrit 39.0 - 52.0 % 42.4  40.6  44.2   Platelets 150 - 400 K/uL 306  235  234      CBG: No results for input(s): "GLUCAP" in the last 168 hours.  Brief HPI:   Johnny Robinson is a 57 y.o. male with history of CKD secondary to renal infarct in the past, HTN, SDH '92, chronic LBP, compressive myelopathy with radiculopathy who underwent cervical decompression at  outpatient surgical center on 11/13/22. Post op, he was found to have profound left sided weakness and numbness felt to be due to C5/6 cord contusion as noted on follow up MRI. He was transferred to [facility] and PT/OT evaluations done revealing need for +2 mod assist with mobility, tendency of knees to buckle, flexed posture as well as pain/sensory deficits affecting ADLs and mobility. CIR recommended due to functional decline.    Hospital Course: Johnny Robinson was admitted to rehab 11/15/2022 for inpatient therapies to consist of PT and OT at least three hours five days a week. Past admission physiatrist, therapy team and rehab RN have worked together to provide customized collaborative inpatient rehab. His blood pressures were  monitored on TID basis and have been stable.  Voiding function was monitored with PVR checks and flomax added with improvement. Bowel program has been augmented to help manage constipation as KUB done showing possible ileus. He had good results with laxative as well as additon of reglan and latter has been weaned off.  His pain has been controlled with prn [place] of hydrocodone as well as flexeril which has been effective in managing should pain as well as insomnia. Gabapentin was added to help manage neuropathy and titrated up to 100 mg qid at discharge. Patient/wife reported higher doses may have been effective in the past and they will follow up with Dr. Jordan Likes for input.  Follow up labs on 03/04 showed leucocytosis as well as pre-renal azotemia and hyponatremia. He did report cough that was worsening and CXR done revealing multifocal PNA and he improved with 5 day course of Augmentin. With increase in intake, follow BMET showed lytes and renal status to be WNL. He continued to have extreme pain in left shoulder and left arm and trigger point injections done left neck/upper trap and left pec with great response. He continued to have left shoulder pain at nights and has been referred to sports medicine for shoulder injection.  Neck incision is C/D/I and is healing well without any signs or symptoms of infection.  Dr. Jordan Likes has also been following along for input and recommended steroid Dosepak at discharge to see if this would help ameliorate left shoulder pain. He has made steady gains and is modified independent for mobility. He will continue to receive outpatient PT and OT at [facility] at [facility] after discharge.     Rehab course: During patient's stay in rehab weekly team conferences were held to monitor patient's progress, set goals and discuss barriers to discharge. At admission, patient required max assist with basic self care tasks and mod assist with mobility. He  has had improvement in activity  tolerance, balance, postural control as well as ability to compensate for deficits. He has had improvement in functional use LUE  and LLE as well as improvement in awareness. He requires supervision most with bathing and dressing tasks except for min assist with donning socks/shoes. He is independent for transfers and to ambulate 1000' with [facility] He requires supervision to climb 24 stairs. Family education has been completed.   Disposition: Home  Diet: Regular.   Special Instructions: No driving or strenuous activity till cleared by MD Can use collar as needed for support.  Discharge Instructions     AMB referral to sports medicine   Complete by: As directed    Need left shoulder injected   Ambulatory referral to Occupational Therapy   Complete by: As directed    Eval and treat   Ambulatory  referral to Physical Medicine Rehab   Complete by: As directed    4-6 weeks appt/would set up now   Ambulatory referral to Physical Therapy   Complete by: As directed    Eval and treat      Allergies as of 11/30/2022   No Known Allergies      Medication List     STOP taking these medications    HYDROcodone-acetaminophen 5-325 MG tablet Commonly known as: NORCO/VICODIN Replaced by: HYDROcodone-acetaminophen 10-325 MG tablet   ibuprofen 800 MG tablet Commonly known as: ADVIL   losartan 100 MG tablet Commonly known as: COZAAR   predniSONE 20 MG tablet Commonly known as: DELTASONE       TAKE these medications    aspirin EC 81 MG tablet Take 81 mg by mouth daily.   atorvastatin 20 MG tablet Commonly known as: LIPITOR Take 1 tablet (20 mg total) by mouth once daily   cyclobenzaprine 5 MG tablet Commonly known as: FLEXERIL Take 1 tablet (5 mg total) by mouth 3 (three) times daily as needed for muscle spasms. What changed:  medication strength how much to take reasons to take this   diclofenac Sodium 1 % Gel Commonly known as: VOLTAREN Apply 4 g topically 4 (four)  times daily. Notes to patient: To left shoulder   gabapentin 100 MG capsule Commonly known as: NEURONTIN Take 1 capsule (100 mg total) by mouth 2 (two) times daily.   HYDROcodone-acetaminophen 10-325 MG tablet--Rx# 40 tabs Commonly known as: NORCO Take 1-2 tablets by mouth every 6 (six) hours as needed for severe pain. Replaces: HYDROcodone-acetaminophen 5-325 MG tablet   methylPREDNISolone 4 MG Tbpk tablet Commonly known as: MEDROL DOSEPAK Take 6 tabs by mouth on day 1, 5 tabs on day 2, 4 tabs on day 3, 3 tabs on day 4, 2 tabs on day 5, and 1 tab on day 6. What changed:  when to take this additional instructions   metoprolol succinate 25 MG 24 hr tablet Commonly known as: TOPROL-XL Take 1 tablet (25 mg total) by mouth once daily   multivitamin with minerals Tabs tablet Take 1 tablet by mouth in the morning.   omeprazole 40 MG capsule Commonly known as: PRILOSEC TAKE ONE CAPSULE BY MOUTH EVERY MORNING What changed: when to take this   polyethylene glycol powder 17 GM/SCOOP powder Commonly known as: GLYCOLAX/MIRALAX Take 17 g by mouth daily.   Senexon-S 8.6-50 MG tablet Generic drug: senna-docusate Take 2 tablets by mouth daily with supper.   tamsulosin 0.4 MG Caps capsule Commonly known as: FLOMAX Take 1 capsule (0.4 mg total) by mouth daily.   terbinafine 250 MG tablet Commonly known as: LAMISIL Take 1 tablet (250 mg total) by mouth daily.        Follow-up Information     Sparks, Duane Lope, MD Follow up.   Specialty: Internal Medicine Why: Call in 1-2 days for post hospital follow up Contact information: [address] [facility] [place] [address] [phone]         Julio Sicks, MD Follow up.   Specialty: Neurosurgery Why: Call in 1-2 days for post hospital follow up Contact information: [address] [place] [address] [phone]         Genice Rouge, MD Follow up.   Specialty: Physical Medicine  and Rehabilitation Why: office will call you with follow up appointment Contact information: [address] [place] [address] [phone]  Signed: Jacquelynn Cree 11/30/2022, 9:39 AM

## 2022-11-28 NOTE — Progress Notes (Signed)
Physical Therapy Session Note  Patient Details  Name: Johnny Robinson MRN: [id number] Date of Birth: [birth year]  Today's Date: 11/28/2022 PT Individual Time: [id number] + 1500-1526 PT Individual Time Calculation (min): 57 min + 82mn  Short Term Goals: Week 2:  PT Short Term Goal 1 (Week 2): =LTGs d/t ELOS  Skilled Therapeutic Interventions/Progress Updates:     Session 1: Chart reviewed and pt agreeable to therapy. Pt received seated in recliner with no c/o pain. Session focused on review of functional transfers in preparation of d/c, activity tolerance, and increased independence with amb to promote return to baseline. Pt initiated session with amb to therapy gym using supervision + SBQC. Pt then completed ramp walking, curb step, and 12 steps with no rails all with supervision + SBQC. Pt then completed 12 mins on NuSteps with interval training of workloads 5-10. Pt then practiced amb with no AD for forward, backwards, and side step walking all with supervision. Of note, pt amb 1226fwith no AD + supervision. Pt also completed BERG with score of 51/56. Session education emphasized need to continue SL balance training to promote return to baseline. At end of session, pt was left seated in recliner with alarm engaged, nurse call bell and all needs in reach.  Session 2: Chart reviewed and pt agreeable to therapy. Pt received seated in recliner with no c/o pain. Session focused on L ankle NMR and strengthening to promote return to independent baseline of walking. Pt initiated session with amb to therapy gym using ModI + SBQC. Pt then completed blocked practice of staggered stance on foam, heel raises for sets of 10, L ankle SLS for sets of 30sec, and RLE fl/ext/abd with LLE in SLS. Pt also completed amb of 50 ft with no AD. At end of session, pt was left seated in recliner with alarm engaged, nurse call bell and all needs in reach.    Therapy Documentation Precautions:  Precautions Precautions: Fall,  Cervical Precaution Comments: reviewed cervical precautions Required Braces or Orthoses: Cervical Brace Cervical Brace: Soft collar Restrictions Weight Bearing Restrictions: No General:      Therapy/Group: Individual Therapy  KiMarquette OldPT, DPT 11/28/2022, 3:45 PM

## 2022-11-28 NOTE — Progress Notes (Addendum)
Patient ID: Johnny Robinson, male   DOB: [birth year], 57 y.o.   MRN: [id number]  Sw faxed outpatient PT/OT referral to [facility] location (p:[phone]/f:[phone]).  SW provided pt with handicap placard. He reports his wife purchased quad cane off [place] due to SW reporting item could possibly be out of stock. SW reviewed discharge. No questions/concerns reported.   Loralee Pacas, MSW, [place] Office: [phone] Cell: [phone] Fax: [phone]

## 2022-11-28 NOTE — Progress Notes (Signed)
Occupational Therapy Session Note  Patient Details  Name: Johnny Robinson MRN: [id number] Date of Birth: September 07, 1966  Today's Date: 11/28/2022 OT Individual Time: 1130-1200 OT Individual Time Calculation (min): 30 min    Short Term Goals: Week 1:  OT Short Term Goal 1 (Week 1): Pt will complete 1/3 toileting steps with mod A for balance OT Short Term Goal 1 - Progress (Week 1): Met OT Short Term Goal 2 (Week 1): Pt will thread LB into clothing utilizing AE PRN with supervision OT Short Term Goal 2 - Progress (Week 1): Met OT Short Term Goal 3 (Week 1): Pt will complete sit > stand using LRAD with CGA in prep for ADL task OT Short Term Goal 3 - Progress (Week 1): Met OT Short Term Goal 4 (Week 1): Pt will complete toilet transfer with min A using LRAD OT Short Term Goal 4 - Progress (Week 1): Met Week 2:  OT Short Term Goal 1 (Week 2): STG=LTG 2/2 ELOS  Skilled Therapeutic Interventions/Progress Updates:    1:1 Pt received in the recliner. Pt ambulated from his room to the gym without device with contact guard to supervision with cues for maintain left hip/LE at neutral. Seated on mat in gym focus on normal patterns of movement of left UE. Pt with shoulder abduction in tasks focus on maintaining elbow close to trunk with shoulder elevation and flexion. Pt crumpled newspaper with left hand with increased thumb flexion/extension into a ball and then position UE in a throwing overhand position with min A against gravity to throw and time his release over the table top.  Practiced numerous times.   Pt ambulated back to room with contact guard without AD. Returned to recliner in room with call bell in prep for  lunch.   Therapy Documentation Precautions:  Precautions Precautions: Fall, Cervical Precaution Comments: reviewed cervical precautions Required Braces or Orthoses: Cervical Brace Cervical Brace: Soft collar Restrictions Weight Bearing Restrictions: No  Pain:  No c/o pain in session    Therapy/Group: Individual Therapy  Willeen Cass [facility] 11/28/2022, 3:30 PM

## 2022-11-28 NOTE — Progress Notes (Signed)
PROGRESS NOTE   Subjective/Complaints:   Pt reports pain in LUE/shoulder/arm a little better- ~ 4/10- was 5+/10 before injections yesterday- more sore today but thinks due to injections and sounds like might have small hematoma on L bicep/triceps area from injection yesterday.   Would rather take Mg citrate- works better than Senokot- but did take Miralax yesterday and scheduled for daily for now- will see how that works as well ROS:  Pt denies SOB, abd pain, CP, N/V/C/D, and vision changes  Except for HPI    Objective:   No results found. Recent Labs    11/26/22 0614  WBC 6.8  HGB 14.4  HCT 42.4  PLT 306    Recent Labs    11/26/22 0614  NA 137  K 3.9  CL 101  CO2 26  GLUCOSE 110*  BUN 20  CREATININE 1.14  CALCIUM 9.0     Intake/Output Summary (Last 24 hours) at 11/28/2022 0846 Last data filed at 11/27/2022 2200 Gross per 24 hour  Intake 880 ml  Output --  Net 880 ml         Physical Exam: Vital Signs Blood pressure 108/76, pulse 75, temperature 98.5 F (36.9 C), temperature source Oral, resp. rate 18, height '6\' 1"'$  (1.854 m), weight 94.2 kg, SpO2 97 %.        General: awake, alert, appropriate, sitting on mat in dayroom with therapy; NAD HENT: conjugate gaze; oropharynx moist CV: regular rate; no JVD Pulmonary: CTA B/L; no W/R/R- good air movement GI: soft, NT, ND, (+)BS- slightly hypoactive Psychiatric: appropriate Neurological: Ox3  Ext: no clubbing, cyanosis, or edema- possible palpation of small hematoma in L tricep- smaller, per pt than yesterday Psych: pleasant and cooperative   Musculoskeletal: Has NEW L hand grasp - hard ot tell since using Saebo, but at least 2-/5     Cervical back: Neck supple.  Neuro: RUE 5/5 in biceps, triceps, WE< grip and FA LUE- biceps 5-/5; triceps 4/5; WE 4/5; FA 2 to 2+/5, FF and FE 2- to 2+/5 RLE- 5/5 in HF, KE, KF DF and PF and EHL LLE- HF 3/5; KE  4-/5; KF 2/5; DF 4-/5, PF 4/5; EHL 4-/5 -  Skin:    General: Skin is warm and dry.     Comments: R handed IV Looks OK  Neurological:     Mental Status: He is alert and oriented to person, place, and time.     Comments: Sensation intact to light touch in all 4 extremities except L C8 distribution is reduced Tone is normal in UE and LE   Assessment/Plan: 1. Functional deficits which require 3+ hours per day of interdisciplinary therapy in a comprehensive inpatient rehab setting. Physiatrist is providing close team supervision and 24 hour management of active medical problems listed below. Physiatrist and rehab team continue to assess barriers to discharge/monitor patient progress toward functional and medical goals  Care Tool:  Bathing    Body parts bathed by patient: Left arm, Chest, Abdomen, Front perineal area, Right upper leg, Left upper leg, Face, Right lower leg   Body parts bathed by helper: Buttocks, Left lower leg, Right arm     Bathing assist  Assist Level: Moderate Assistance - Patient 50 - 74%     Upper Body Dressing/Undressing Upper body dressing   What is the patient wearing?: Pull over shirt    Upper body assist Assist Level: Moderate Assistance - Patient 50 - 74%    Lower Body Dressing/Undressing Lower body dressing      What is the patient wearing?: Pants     Lower body assist Assist for lower body dressing: Maximal Assistance - Patient 25 - 49%     Toileting Toileting    Toileting assist Assist for toileting: Maximal Assistance - Patient 25 - 49%     Transfers Chair/bed transfer  Transfers assist     Chair/bed transfer assist level: Moderate Assistance - Patient 50 - 74%     Locomotion Ambulation   Ambulation assist      Assist level: Moderate Assistance - Patient 50 - 74% Assistive device: Walker-rolling Max distance: 140 ft   Walk 10 feet activity   Assist     Assist level: Moderate Assistance - Patient - 50 - 74% Assistive  device: Walker-rolling   Walk 50 feet activity   Assist    Assist level: Moderate Assistance - Patient - 50 - 74% Assistive device: Walker-rolling    Walk 150 feet activity   Assist Walk 150 feet activity did not occur: Safety/medical concerns         Walk 10 feet on uneven surface  activity   Assist Walk 10 feet on uneven surfaces activity did not occur: Safety/medical concerns         Wheelchair     Assist Is the patient using a wheelchair?: Yes (transport only on eval) Type of Wheelchair: Manual    Wheelchair assist level: Dependent - Patient 0%      Wheelchair 50 feet with 2 turns activity    Assist        Assist Level: Dependent - Patient 0%   Wheelchair 150 feet activity     Assist      Assist Level: Dependent - Patient 0%   Blood pressure 108/76, pulse 75, temperature 98.5 F (36.9 C), temperature source Oral, resp. rate 18, height '6\' 1"'$  (1.854 m), weight 94.2 kg, SpO2 97 %.  Medical Problem List and Plan: 1. Functional deficits secondary to Cervical myelopathy with L sided weakness s/p C3-6 ACDF             -patient may  shower- cover incision- will see if therapy can use Estim once assessed. No cancer hx.              -ELOS/Goals: 7-10 days- mod I to supervision  D/c 3/15  Con't CIR PT and OT- still making gains on LUE 2.  Antithrombotics: -DVT/anticoagulation:  Mechanical: Sequential compression devices, below knee Bilateral lower extremities             Will see when can start Lovenox 3/4- Lovenox started Friday with OK from [facility]             -antiplatelet therapy: N/A 3. Pain Management: Hydrocodone and flexeril prn for pain   3/4- pain usually controlled- con't regimen- con't soft collar as well.   3/5- --Fexeril prn for muscle pain.              --gabapentin added BID to help LUE/LLE achy pain.  Pain well controlled- can stop use of soft collar per NSU  3/6- throat less sore; overall pain controlled- con't regimen  3/7-  having intense L neck  pain -will do trigger point injections- meds not helping  3/8- Having to wake up at night to take pain meds- set alarms to do so- says pain skyrockets if doesn't- con't regimen for now  3/9-10 changed to flexeril for trap/shoulder girdle spasm. He feels this is helping and would like to continue. Continue to hold baclofen  3/11- still wants injections due to pain- will do tomorrow  3/12- done today  3/13- less pain in LUE since bicipital tendinitis and trigger point injections yesterday- not gone, but better 4. Mood/Behavior/Sleep: LCSW to follow for evaluation and support.              3/5- mood in good spirits             -antipsychotic agents: N/A 5. Neuropsych/cognition: This patient is capable of making decisions on his own behalf. 6. Skin/Wound Care: Monitor wound for healing.  7. Fluids/Electrolytes/Nutrition: Monitor I/O. Check CMET in am.  8. Renal infarct//hx of CKD/HTN: Monitor BP TID--on metoprolol to keep SBP 90-100 range per wife             --was not taking Cozaar PTA.   3/9-10- BP well controlled- con't regimen 9. GERD: Continue PPI. 10. Labs: Wife reports no labs done pre-op.   --Recheck CMET/CBC in am.   3/4- labs look great- except Albumin of 2.9-  11. Constipation- LBM 3 days ago- not on anything scheduled- will schedule bowel meds- if no results by tomorrow, will do Sorbitol.   3/1- will give Sorbitol if no BM by 5pm.   3/4- had Mg citrate this weekend- had 1 small BM, but no more even with finishing mg citrate yesterday- will check KUB for possible ileus-   3/5- KUB showed small amount of stool- no ileus- did finally have good BM x2 and feels cleaned out.   3/10--had medium bm this morning but still feels full and not "cleaned out"   -re-order mg citrate  3/11- Was "cleaned out with mg citrate"  3/12 - will add Miralax so doesn't need Mg citrate, hopefully  3/13- pt feels Mg citrate works, so doesn't feel need to change things up- asked him to  try miralax to see 12. Multifocal pneumonia  3/5- started on Augmentin per PA- which I agree with- feeling good today   -WBC was 14.5- down to 8.9 after augmentin- will con't  3/6- educated on Flutter valve and ICS- also has some congestion, but feels better than 2 days ago 13. L neck and L shoulder/anterior/biceps tendinitis pain 3/7- pt needs trigger point injections- will do today at 10 am.    3/8- Neck pain much better- still having L shoulder and L biceps pain- will need to do additional injection next Tuesday- since I'm in clinic Friday/Monday  3/9-10 reports ongoing trap pain today in addition to above.    -flexeril trial as above in #3  3/11- will do TrP injections as well as L biceps tendinitis steroid injection on Tuesday  3.12 - done today  3/13- pain better- just not resolved yet- down to 4/10  Patient here for trigger point injections for  Consent done and on chart.  Cleaned areas with alcohol and injected using a 27 gauge 1.5 inch needle  11/27/22 Injected 3cc- no wastage Using 1% Lidocaine with no EPI  Upper traps= L x2 Levators Posterior scalenes Middle scalenes Splenius Capitus Pectoralis Major Rhomboids Infraspinatus Teres Major/minor Thoracic paraspinals Lumbar paraspinals Other injections- L biceps and L triceps   Patient's level of  pain prior was Current level of pain after injections is  There was no bleeding or complications.  Patient was advised to drink a lot of water on day after injections to flush system Will have increased soreness for 12-48 hours after injections.  Can use Lidocaine patches the day AFTER injections Can use theracane on day of injections in places didn't inject Can use heating pad 4-6 hours AFTER injections  11/27/22 steroid injection was performed at L bicipital tendons origin using 1% plain Lidocaine and '40mg'$  /1cc of Kenalog. This was well tolerated.  Cleaned with betadine x3 and allowed to dry- then alcohol then  injected using 27 gauge 1.5 inch needle- no bleeding or complications.    F/U in 3 months for steroid injections of L biceps tendons prn Lidocaine will kick in 15 minutes- and wear off tonight- the steroid will kick in tomorrow within 24 hours and take up to 72 hours to fully kick in.       LOS: 13 days A FACE TO FACE EVALUATION WAS PERFORMED  Dacoda Finlay 11/28/2022, 8:46 AM

## 2022-11-28 NOTE — Progress Notes (Signed)
Occupational Therapy Session Note  Patient Details  Name: Johnny Robinson MRN: [id number] Date of Birth: 11/24/1965  Today's Date: 11/28/2022 OT Individual Time: 0700-0810 OT Individual Time Calculation (min): 70 min    Short Term Goals: Week 2:  OT Short Term Goal 1 (Week 2): STG=LTG 2/2 ELOS  Skilled Therapeutic Interventions/Progress Updates:    Pt resting in recliner upon arrival. Amb with [facility] throughout session at CGA/supervision level. Pt amb to day room for LUE/hand GMC/FMC tasks with theraputty. Provided pt with info on [facility] ([employer] is out of stock.) Pt provided handout for theraputty activities. Pt return demonstrated tasks. Pt issued 10 small bead with tan theraputty. Pt challenged with removing beads, picking them up from table top, and placing in container. Pt required rest breaks X 2 during activity. Pt challenged with using index finger to push beads back into putty. Pt carried container in Lt hand while amb back to room.  Saebo placed on pt's Lt hand to activate thumb opposition.  Saebo Stim One 330 pulse width 35 Hz pulse rate On 8 sec/ off 8 sec Ramp up/ down 2 sec Symmetrical Biphasic wave form  Max intensity 166m at 500 Ohm load   Therapist removed Saebo at completion of cycle. No adverse reaction noted.    Therapy Documentation Precautions:  Precautions Precautions: Fall, Cervical Precaution Comments: reviewed cervical precautions Required Braces or Orthoses: Cervical Brace Cervical Brace: Soft collar Restrictions Weight Bearing Restrictions: No Pain: Pt reports 4/10 LUE/shoulder pain; MD aware   Therapy/Group: Individual Therapy  [name]3/13/2024, 8:10 AM

## 2022-11-29 ENCOUNTER — Other Ambulatory Visit (HOSPITAL_COMMUNITY): Payer: Self-pay

## 2022-11-29 MED ORDER — ADULT MULTIVITAMIN W/MINERALS CH: 1.0000 | ORAL_TABLET | Freq: Every morning | ORAL | Status: AC

## 2022-11-29 MED ORDER — HYDROCODONE-ACETAMINOPHEN 10-325 MG PO TABS
1.0000 | ORAL_TABLET | Freq: Four times a day (QID) | ORAL | 0 refills | Status: DC | PRN
  Filled 2022-11-29: qty 40, 5d supply, fill #0

## 2022-11-29 MED ORDER — SENNOSIDES-DOCUSATE SODIUM 8.6-50 MG PO TABS
2.0000 | ORAL_TABLET | Freq: Every day | ORAL | 0 refills | Status: DC
  Filled 2022-11-29 – 2022-11-30 (×2): qty 60, 30d supply, fill #0

## 2022-11-29 MED ORDER — POLYETHYLENE GLYCOL 3350 17 GM/SCOOP PO POWD
17.0000 g | Freq: Every day | ORAL | 0 refills | Status: DC
  Filled 2022-11-29 – 2022-11-30 (×2): qty 238, 14d supply, fill #0

## 2022-11-29 MED ORDER — TAMSULOSIN HCL 0.4 MG PO CAPS
0.4000 mg | ORAL_CAPSULE | Freq: Every day | ORAL | 0 refills | Status: DC
  Filled 2022-11-29: qty 30, 30d supply, fill #0

## 2022-11-29 MED ORDER — CYCLOBENZAPRINE HCL 5 MG PO TABS
5.0000 mg | ORAL_TABLET | Freq: Three times a day (TID) | ORAL | 0 refills | Status: DC | PRN
  Filled 2022-11-29: qty 30, 10d supply, fill #0

## 2022-11-29 MED ORDER — GABAPENTIN 100 MG PO CAPS
100.0000 mg | ORAL_CAPSULE | Freq: Two times a day (BID) | ORAL | 0 refills | Status: DC
  Filled 2022-11-29: qty 60, 30d supply, fill #0

## 2022-11-29 MED ORDER — DICLOFENAC SODIUM 1 % EX GEL
4.0000 g | Freq: Four times a day (QID) | CUTANEOUS | 0 refills | Status: AC
  Filled 2022-11-29: qty 400, 25d supply, fill #0

## 2022-11-29 NOTE — Progress Notes (Signed)
Occupational Therapy Discharge Summary  Patient Details  Name: Johnny Robinson MRN: [id number] Date of Birth: 06/22/1966  Date of Discharge from [place] service:November 29, 2022  Patient has met 10 of 10 long term goals due to improved activity tolerance, improved balance, postural control, ability to compensate for deficits, functional use of  LEFT upper and LEFT lower extremity, and improved coordination. Pt made steady progress with BADLs and functional transfers during this admission. Pt requires supervision for bahting/dressing tasks with exception for min A donning socks/shoes. Toilet transfers and toileting with mod I. Shower transers with supervision using [employer]. Pt using LUE as gross assist for BADLs. Pt with weak finger flexion/extension and thumb opposition.Pt purchased a Saebo for home use. Pt's wife has been present for therapy and provides the appropriate level of supervisoin/assistance. Patient to discharge at overall Supervision level.  Patient's care partner is independent to provide the necessary physical assistance at discharge.    Reasons goals not met: n/a  Recommendation:  Patient will benefit from ongoing skilled OT services in outpatient setting to continue to advance functional skills in the area of BADL, iADL, Vocation, and Reduce care partner burden.  Equipment: No equipment provided  Reasons for discharge: treatment goals met and discharge from hospital  Patient/family agrees with progress made and goals achieved: Yes  OT Discharge  ADL ADL Eating: Modified independent Where Assessed-Eating: Chair Grooming: Modified independent Where Assessed-Grooming: Sitting at sink Upper Body Bathing: Supervision/safety Where Assessed-Upper Body Bathing: Sitting at sink Lower Body Bathing: Supervision/safety Where Assessed-Lower Body Bathing: Sitting at sink, Standing at sink Upper Body Dressing: Supervision/safety Where Assessed-Upper Body Dressing: Chair Lower Body Dressing:  Minimal assistance Where Assessed-Lower Body Dressing: Sitting at sink, Standing at sink Toileting: Modified independent Where Assessed-Toileting: Risk analyst Method: Counselling psychologist: Raised toilet seat Tub/Shower Transfer: Unable to assess Tub/Shower Transfer Method: Unable to assess Social research officer, government: Close supervision Social research officer, government Method: Heritage manager: Civil engineer, contracting with back Vision Baseline Vision/History: 0 No visual deficits Patient Visual Report: No change from baseline Vision Assessment?: No apparent visual deficits Perception  Perception: Within Functional Limits Praxis Praxis: Intact Cognition Cognition Overall Cognitive Status: Within Functional Limits for tasks assessed Arousal/Alertness: Awake/alert Orientation Level: Person;Place;Situation Person: Oriented Place: Oriented Situation: Oriented Memory: Appears intact Attention: Selective;Sustained;Focused Focused Attention: Appears intact Sustained Attention: Appears intact Selective Attention: Appears intact Awareness: Appears intact Problem Solving: Appears intact Safety/Judgment: Appears intact Brief Interview for Mental Status (BIMS) Repetition of Three Words (First Attempt): 3 Temporal Orientation: Year: Correct Temporal Orientation: Month: Accurate within 5 days Temporal Orientation: Day: Correct Recall: "Sock": Yes, no cue required Recall: "Blue": Yes, no cue required Recall: "Bed": Yes, no cue required BIMS Summary Score: 15 Sensation Sensation Light Touch: Appears Intact Hot/Cold: Appears Intact Proprioception: Impaired Detail Proprioception Impaired Details: Impaired LLE;Impaired LUE Coordination Gross Motor Movements are Fluid and Coordinated: No Fine Motor Movements are Fluid and Coordinated: No Coordination and Movement Description: Mild dyscoordination, UE>LE Finger Nose Finger  Test: WFL RUE; impaired LUE 9 Hole Peg Test: unable to complete with LUE Motor  Motor Motor: Other (comment);Motor impersistence Motor - Skilled Clinical Observations: hemiparesis of L side Motor - Discharge Observations: Greatly improved, but pt continues to demo strength impairments with fatigue. Mobility  Bed Mobility Right Sidelying to Sit: Independent Sit to Supine: Independent Transfers Sit to Stand: Independent with assistive device  Trunk/Postural Assessment  Cervical Assessment Cervical Assessment: Exceptions to [facility] (cervical precautions) Thoracic Assessment Thoracic Assessment:  Within Functional Limits Lumbar Assessment Lumbar Assessment: Within Functional Limits Postural Control Postural Control: Within Functional Limits  Balance Balance Balance Assessed: Yes Standardized Balance Assessment Standardized Balance Assessment: Berg Balance Test Berg Balance Test Sit to Stand: Able to stand without using hands and stabilize independently Standing Unsupported: Able to stand safely 2 minutes Sitting with Back Unsupported but Feet Supported on Floor or Stool: Able to sit safely and securely 2 minutes Stand to Sit: Sits safely with minimal use of hands Transfers: Able to transfer safely, minor use of hands Standing Unsupported with Eyes Closed: Able to stand 10 seconds safely Standing Ubsupported with Feet Together: Able to place feet together independently and stand 1 minute safely From Standing, Reach Forward with Outstretched Arm: Can reach confidently >25 cm (10") From Standing Position, Pick up Object from Floor: Able to pick up shoe safely and easily From Standing Position, Turn to Look Behind Over each Shoulder: Turn sideways only but maintains balance Turn 360 Degrees: Able to turn 360 degrees safely in 4 seconds or less Standing Unsupported, Alternately Place Feet on Step/Stool: Able to stand independently and safely and complete 8 steps in 20 seconds Standing  Unsupported, One Foot in Front: Able to plae foot ahead of the other independently and hold 30 seconds Standing on One Leg: Able to lift leg independently and hold equal to or more than 3 seconds Total Score: 51 Static Sitting Balance Static Sitting - Balance Support: Feet supported;No upper extremity supported Static Sitting - Level of Assistance: 6: Modified independent (Device/Increase time) Dynamic Sitting Balance Dynamic Sitting - Balance Support: During functional activity Dynamic Sitting - Level of Assistance: 6: Modified independent (Device/Increase time) Static Standing Balance Static Standing - Balance Support: No upper extremity supported;During functional activity Static Standing - Level of Assistance: 6: Modified independent (Device/Increase time) Dynamic Standing Balance Dynamic Standing - Balance Support: During functional activity;No upper extremity supported Dynamic Standing - Level of Assistance: 6: Modified independent (Device/Increase time) Extremity/Trunk Assessment RUE Assessment RUE Assessment: Within Functional Limits Active Range of Motion (AROM) Comments: WFL LUE Assessment Active Range of Motion (AROM) Comments: ~90 degrees shoulder flexion, elbow WFL, wrist flexion/extension limited; finger flexion/extension limited General Strength Comments: 4-/5 biceps, 4-/5 biceps, 3-/5 wrist flexion/extension, 3-/5 finger flexion   [name] 11/29/2022, 2:54 PM

## 2022-11-29 NOTE — Progress Notes (Signed)
Occupational Therapy Session Note  Patient Details  Name: Johnny Robinson MRN: [id number] Date of Birth: 1966-09-03  Today's Date: 11/29/2022 OT Individual Time: [id number] OT Individual Time Calculation (min): 70 min    Short Term Goals: Week 2:  OT Short Term Goal 1 (Week 2): STG=LTG 2/2 ELOS  Skilled Therapeutic Interventions/Progress Updates:    Pt resting in recliner upon arrival. Amb with [facility] throughout session with supervision/mod I. 7 mins NuStep level 6 BLE only. LUE FMC/GMC tasks with focus on pincher grasp and thumb opposition: theraputty with small beads, velcro checker board. Pt provided info sheet on vinegar soak for Lt hand. Pt amb with [facility] back to room while holding theraputty container in Lt hand. Pt returned to recliner. All needs within reach.  Therapy Documentation Precautions:  Precautions Precautions: Fall, Cervical Precaution Comments: reviewed cervical precautions Required Braces or Orthoses: Cervical Brace Cervical Brace: Soft collar Restrictions Weight Bearing Restrictions: No Pain: Pain Assessment Pain Scale: 0-10 Pain Score: 5  Pain Type: Acute pain Pain Location: Shoulder Pain Orientation: Left Pain Descriptors / Indicators: Aching Pain Intervention(s): meds admins prior to therapy  Therapy/Group: Individual Therapy  [name] 11/29/2022, 9:34 AM

## 2022-11-29 NOTE — Progress Notes (Signed)
Physical Therapy Session Note  Patient Details  Name: Johnny Robinson MRN: [id number] Date of Birth: Dec 05, 1965  Today's Date: 11/29/2022 PT Individual Time: 1000-1100 PT Individual Time Calculation (min): 60 min   Short Term Goals: Week 2:  PT Short Term Goal 1 (Week 2): =LTGs d/t ELOS  Skilled Therapeutic Interventions/Progress Updates:    Pt received in recliner and agreeable to therapy.  Pt reports shoulder and neck soreness after injections, but overall pain improving. Pt ambulated throughout session with quad cane. Demoes some hip hiking and poor foot clearance with fatigue, as well as internal rotation and decreased coordination with fatigue. Pt naivgated hospital steps with quad cane and CGA to ground floor and back to 4th floor. Pt then participated in MMT and d/c interview as documented in flow sheet. Discussed pt's berg results and continuing to use quad cane d/t fatigue. Pt then performed runners step up on 8" step with R hand rail and CGA 3 x 15, could not finish last set with LLE d/t hip flexor weakness. After seated rest break pt returned to room and was left with all needs in reach.  Therapy Documentation Precautions:  Precautions Precautions: Fall, Cervical Precaution Comments: reviewed cervical precautions Required Braces or Orthoses: Cervical Brace Cervical Brace: Soft collar Restrictions Weight Bearing Restrictions: No General:       Therapy/Group: Individual Therapy  Mickel Fuchs 11/29/2022, 12:50 PM

## 2022-11-29 NOTE — Progress Notes (Signed)
PROGRESS NOTE   Subjective/Complaints:   Pt reports shoulder hurting at night- wondering what the next step is.  Explained can refer to Sports Medicine/Ortho- he wants referral.   Bowels working OK- advised pt to not walk to toilet on own without being made Mod I in room- he likely will this afternoon, but don't want him to fall.   ROS:  Pt denies SOB, abd pain, CP, N/V/C/D, and vision changes  Except for HPI    Objective:   No results found. No results for input(s): "WBC", "HGB", "HCT", "PLT" in the last 72 hours.   No results for input(s): "NA", "K", "CL", "CO2", "GLUCOSE", "BUN", "CREATININE", "CALCIUM" in the last 72 hours.    Intake/Output Summary (Last 24 hours) at 11/29/2022 0855 Last data filed at 11/29/2022 0810 Gross per 24 hour  Intake 720 ml  Output --  Net 720 ml         Physical Exam: Vital Signs Blood pressure 130/77, pulse 79, temperature 98 F (36.7 C), temperature source Oral, resp. rate 18, height '6\' 1"'$  (1.854 m), weight 94.2 kg, SpO2 95 %.        General: awake, alert, appropriate, walked out of bathroom on own using quad cane; wife in room;  NAD HENT: conjugate gaze; oropharynx moist CV: regular rate; no JVD Pulmonary: CTA B/L; no W/R/R- good air movement GI: soft, NT, ND, (+)BS Psychiatric: appropriate Neurological: Ox3  Ext: no clubbing, cyanosis, or edema- possible palpation of small hematoma in L tricep- smaller, per pt than yesterday Psych: pleasant and cooperative   Musculoskeletal: Has NEW L hand grasp - hard ot tell since using Saebo, but at least     Cervical back: Neck supple.  Neuro: RUE 5/5 in biceps, triceps, WE< grip and FA LUE- Biceps 4+/5; Triceps 4+/5; WE 3+/5, grip 2/5 and FA 0-1/5 RLE- 5/5 in HF, KE, KF DF and PF and EHL LLE- HF 3/5; KE 4-/5; KF 2/5; DF 4-/5, PF 4/5; EHL 4-/5 -  Skin:    General: Skin is warm and dry.     Comments: R handed IV Looks OK   Neurological:     Mental Status: He is alert and oriented to person, place, and time.     Comments: Sensation intact to light touch in all 4 extremities except L C8 distribution is reduced Tone is normal in UE and LE   Assessment/Plan: 1. Functional deficits which require 3+ hours per day of interdisciplinary therapy in a comprehensive inpatient rehab setting. Physiatrist is providing close team supervision and 24 hour management of active medical problems listed below. Physiatrist and rehab team continue to assess barriers to discharge/monitor patient progress toward functional and medical goals  Care Tool:  Bathing    Body parts bathed by patient: Left arm, Chest, Abdomen, Front perineal area, Right upper leg, Left upper leg, Face, Right lower leg   Body parts bathed by helper: Buttocks, Left lower leg, Right arm     Bathing assist Assist Level: Moderate Assistance - Patient 50 - 74%     Upper Body Dressing/Undressing Upper body dressing   What is the patient wearing?: Pull over shirt    Upper body  assist Assist Level: Moderate Assistance - Patient 50 - 74%    Lower Body Dressing/Undressing Lower body dressing      What is the patient wearing?: Pants     Lower body assist Assist for lower body dressing: Maximal Assistance - Patient 25 - 49%     Toileting Toileting    Toileting assist Assist for toileting: Maximal Assistance - Patient 25 - 49%     Transfers Chair/bed transfer  Transfers assist     Chair/bed transfer assist level: Independent with assistive device Chair/bed transfer assistive device: Research officer, political party   Ambulation assist      Assist level: Independent with assistive device Assistive device: Cane-quad Max distance: 500   Walk 10 feet activity   Assist     Assist level: Independent with assistive device Assistive device: Cane-quad   Walk 50 feet activity   Assist    Assist level: Independent with assistive  device Assistive device: Cane-quad    Walk 150 feet activity   Assist Walk 150 feet activity did not occur: Safety/medical concerns  Assist level: Independent with assistive device Assistive device: Cane-quad    Walk 10 feet on uneven surface  activity   Assist Walk 10 feet on uneven surfaces activity did not occur: Safety/medical concerns   Assist level: Independent with assistive device Assistive device: Cane-quad   Wheelchair     Assist Is the patient using a wheelchair?: No Type of Wheelchair: Manual Wheelchair activity did not occur: N/A  Wheelchair assist level: Dependent - Patient 0%      Wheelchair 50 feet with 2 turns activity    Assist    Wheelchair 50 feet with 2 turns activity did not occur: N/A   Assist Level: Dependent - Patient 0%   Wheelchair 150 feet activity     Assist      Assist Level: Dependent - Patient 0%   Blood pressure 130/77, pulse 79, temperature 98 F (36.7 C), temperature source Oral, resp. rate 18, height '6\' 1"'$  (1.854 m), weight 94.2 kg, SpO2 95 %.  Medical Problem List and Plan: 1. Functional deficits secondary to Cervical myelopathy with L sided weakness s/p C3-6 ACDF             -patient may  shower- cover incision- will see if therapy can use Estim once assessed. No cancer hx.              -ELOS/Goals: 7-10 days- mod I to supervision  D/c 3/15  Con't CIR PT and OT- making gains 2.  Antithrombotics: -DVT/anticoagulation:  Mechanical: Sequential compression devices, below knee Bilateral lower extremities             Will see when can start Lovenox 3/4- Lovenox started Friday with OK from [facility]             -antiplatelet therapy: N/A 3. Pain Management: Hydrocodone and flexeril prn for pain   3/4- pain usually controlled- con't regimen- con't soft collar as well.   3/5- --Fexeril prn for muscle pain.              --gabapentin added BID to help LUE/LLE achy pain.  Pain well controlled- can stop use of soft collar  per [facility]  3/6- throat less sore; overall pain controlled- con't regimen  3/7- having intense L neck pain -will do trigger point injections- meds not helping  3/8- Having to wake up at night to take pain meds- set alarms to do so- says pain skyrockets if  doesn't- con't regimen for now  3/9-10 changed to flexeril for trap/shoulder girdle spasm. He feels this is helping and would like to continue. Continue to hold baclofen  3/11- still wants injections due to pain- will do tomorrow  3/12- done today  3/13- less pain in LUE since bicipital tendinitis and trigger point injections yesterday- not gone, but better  3/14- wil refer to either Dr Hulan Saas- the Sports medicine physician, or if cannot get in with him, will do Ortho-  4. Mood/Behavior/Sleep: LCSW to follow for evaluation and support.              3/5- mood in good spirits             -antipsychotic agents: N/A 5. Neuropsych/cognition: This patient is capable of making decisions on his own behalf. 6. Skin/Wound Care: Monitor wound for healing.  7. Fluids/Electrolytes/Nutrition: Monitor I/O. Check CMET in am.  8. Renal infarct//hx of CKD/HTN: Monitor BP TID--on metoprolol to keep SBP 90-100 range per wife             --was not taking Cozaar PTA.   3/9-10- BP well controlled- con't regimen 9. GERD: Continue PPI. 10. Labs: Wife reports no labs done pre-op.   --Recheck CMET/CBC in am.   3/4- labs look great- except Albumin of 2.9-  11. Constipation- LBM 3 days ago- not on anything scheduled- will schedule bowel meds- if no results by tomorrow, will do Sorbitol.   3/1- will give Sorbitol if no BM by 5pm.   3/4- had Mg citrate this weekend- had 1 small BM, but no more even with finishing mg citrate yesterday- will check KUB for possible ileus-   3/5- KUB showed small amount of stool- no ileus- did finally have good BM x2 and feels cleaned out.   3/10--had medium bm this morning but still feels full and not "cleaned out"   -re-order mg  citrate  3/11- Was "cleaned out with mg citrate"  3/12 - will add Miralax so doesn't need Mg citrate, hopefully  3/13- pt feels Mg citrate works, so doesn't feel need to change things up- asked him to try miralax to see 12. Multifocal pneumonia  3/5- started on Augmentin per PA- which I agree with- feeling good today   -WBC was 14.5- down to 8.9 after augmentin- will con't  3/6- educated on Flutter valve and ICS- also has some congestion, but feels better than 2 days ago 13. L neck and L shoulder/anterior/biceps tendinitis pain 3/7- pt needs trigger point injections- will do today at 10 am.    3/8- Neck pain much better- still having L shoulder and L biceps pain- will need to do additional injection next Tuesday- since I'm in clinic Friday/Monday  3/9-10 reports ongoing trap pain today in addition to above.    -flexeril trial as above in #3  3/11- will do TrP injections as well as L biceps tendinitis steroid injection on Tuesday  3.12 - done today  3/13- pain better- just not resolved yet- down to 4/10  3/14- pain still an issues- more bothersome at night- will try to place referral for Dr Hulan Saas at [facility] area- for L shoulder pain-   Patient here for trigger point injections for  Consent done and on chart.  Cleaned areas with alcohol and injected using a 27 gauge 1.5 inch needle  11/27/22 Injected 3cc- no wastage Using 1% Lidocaine with no EPI  Upper traps= L x2 Levators Posterior scalenes Middle scalenes Splenius Capitus  Pectoralis Major Rhomboids Infraspinatus Teres Major/minor Thoracic paraspinals Lumbar paraspinals Other injections- L biceps and L triceps   Patient's level of pain prior was Current level of pain after injections is  There was no bleeding or complications.  Patient was advised to drink a lot of water on day after injections to flush system Will have increased soreness for 12-48 hours after injections.  Can use Lidocaine patches the day  AFTER injections Can use theracane on day of injections in places didn't inject Can use heating pad 4-6 hours AFTER injections  11/27/22 steroid injection was performed at L bicipital tendons origin using 1% plain Lidocaine and '40mg'$  /1cc of Kenalog. This was well tolerated.  Cleaned with betadine x3 and allowed to dry- then alcohol then injected using 27 gauge 1.5 inch needle- no bleeding or complications.    F/U in 3 months for steroid injections of L biceps tendons prn Lidocaine will kick in 15 minutes- and wear off tonight- the steroid will kick in tomorrow within 24 hours and take up to 72 hours to fully kick in.   I spent a total of 35   minutes on total care today- >50% coordination of care- due to  D/w PA x2 about referral to Sports Medicine- will need to call- also advised pt to not act mod I in room until [place] by therapy- don't want a fall.      LOS: 14 days A FACE TO FACE EVALUATION WAS PERFORMED  Sequoyah Ramone 11/29/2022, 8:55 AM

## 2022-11-29 NOTE — Progress Notes (Signed)
Nursing education and nursing care plan completed for discharge.  

## 2022-11-29 NOTE — Progress Notes (Signed)
Physical Therapy Discharge Summary  Patient Details  Name: Johnny Robinson MRN: [id number] Date of Birth: [birth year]  Date of Discharge from PT service:November 29, 2022  Today's Date: 11/29/2022 PT Individual Time: 1425-1520 PT Individual Time Calculation (min): 55 min    Patient has met 7 of 7 long term goals due to improved activity tolerance, improved balance, increased strength, decreased pain, ability to compensate for deficits, and improved coordination.  Patient to discharge at an ambulatory level Modified Independent.   Patient's care partner is independent to provide the necessary physical assistance at discharge. Pt to d/c home with his wife, Johnny Robinson, who is working from home and can provide intermittent support as needed. Johnny Robinson has undergone hands on family education.   Reasons goals not met: NA  Recommendation:  Patient will benefit from ongoing skilled PT services in outpatient setting to continue to advance safe functional mobility, address ongoing impairments in balance, strength, coordination, gait mechanics, and minimize fall risk.  Equipment: Small base quad cane  Reasons for discharge: treatment goals met and discharge from hospital  Patient/family agrees with progress made and goals achieved: Yes  Skilled Therapeutic Interventions/Progress Updates:  Pt received in recliner and agreeable to therapy.  No complaint of pain. Pt ambulated with quad cane at mod I level. Pt participated in obstacle course with cobble foam, rocker boards x 2, airex beam, and up/down compliant wedges. Pt then participated in trials of bird dog exercises per HEP. Pt then ambulated around unit at mod I level to pass out thank snacks to therapists and nursing staff. No LOB noted. Pt remained with family present in room at end of session.   PT Discharge Precautions/Restrictions Precautions Precautions: Fall;Cervical Precaution Comments: reviewed cervical precautions Cervical Brace: Soft  collar   Pain Interference Pain Interference Pain Effect on Sleep: 3. Frequently Pain Interference with Therapy Activities: 1. Rarely or not at all Pain Interference with Day-to-Day Activities: 1. Rarely or not at all Vision/Perception  Vision - History Ability to See in Adequate Light: 0 Adequate Perception Perception: Within Functional Limits  Cognition Overall Cognitive Status: Within Functional Limits for tasks assessed Arousal/Alertness: Awake/alert Orientation Level: Oriented X4 Memory: Appears intact Awareness: Appears intact Problem Solving: Appears intact Safety/Judgment: Appears intact Sensation Sensation Additional Comments: Pt reports LLE diminshed 8% vs RLE, n/t in LUE distally Coordination Gross Motor Movements are Fluid and Coordinated: No Coordination and Movement Description: Mild dyscoordination, UE>LE Motor  Motor Motor: Other (comment);Motor impersistence Motor - Skilled Clinical Observations: hemiparesis of L side Motor - Discharge Observations: Greatly improved, but pt continues to demo strength impairments with fatigue.  Mobility Bed Mobility Right Sidelying to Sit: Independent Sit to Supine: Independent Transfers Sit to Stand: Independent with assistive device Stand Pivot Transfers: Independent with assistive device Transfer (Assistive device): Small based quad cane Locomotion  Gait Ambulation: Yes Gait Assistance: Independent with assistive device Gait Distance (Feet): 1000 Feet Assistive device: Small based quad cane Gait Gait: Yes Gait Pattern: Impaired Gait Pattern: Step-through pattern;Narrow base of support;Poor foot clearance - left;Decreased step length - left (greatly improved, gait impairments mostly with fatigue) Stairs / Additional Locomotion Stairs: Yes Stairs Assistance: Supervision/Verbal cueing Stair Management Technique: With cane Number of Stairs: 24 Height of Stairs: 7 Ramp: Independent with assistive device Curb:  Independent with assistive device Wheelchair Mobility Wheelchair Mobility: No  Trunk/Postural Assessment  Cervical Assessment Cervical Assessment: Exceptions to [facility] (cervical precautions) Thoracic Assessment Thoracic Assessment: Within Functional Limits Lumbar Assessment Lumbar Assessment: Within Functional Limits Postural Control Postural Control:  Within Functional Limits  Balance Balance Balance Assessed: Yes Standardized Balance Assessment Standardized Balance Assessment: Berg Balance Test Berg Balance Test Sit to Stand: Able to stand without using hands and stabilize independently Standing Unsupported: Able to stand safely 2 minutes Sitting with Back Unsupported but Feet Supported on Floor or Stool: Able to sit safely and securely 2 minutes Stand to Sit: Sits safely with minimal use of hands Transfers: Able to transfer safely, minor use of hands Standing Unsupported with Eyes Closed: Able to stand 10 seconds safely Standing Ubsupported with Feet Together: Able to place feet together independently and stand 1 minute safely From Standing, Reach Forward with Outstretched Arm: Can reach confidently >25 cm (10") From Standing Position, Pick up Object from Floor: Able to pick up shoe safely and easily From Standing Position, Turn to Look Behind Over each Shoulder: Turn sideways only but maintains balance Turn 360 Degrees: Able to turn 360 degrees safely in 4 seconds or less Standing Unsupported, Alternately Place Feet on Step/Stool: Able to stand independently and safely and complete 8 steps in 20 seconds Standing Unsupported, One Foot in Front: Able to plae foot ahead of the other independently and hold 30 seconds Standing on One Leg: Able to lift leg independently and hold equal to or more than 3 seconds Total Score: 51 Static Sitting Balance Static Sitting - Balance Support: Feet supported;No upper extremity supported Static Sitting - Level of Assistance: 6: Modified independent  (Device/Increase time) Dynamic Sitting Balance Dynamic Sitting - Balance Support: Feet supported;No upper extremity supported Dynamic Sitting - Level of Assistance: 6: Modified independent (Device/Increase time) Static Standing Balance Static Standing - Balance Support: No upper extremity supported;During functional activity Static Standing - Level of Assistance: 6: Modified independent (Device/Increase time) Dynamic Standing Balance Dynamic Standing - Balance Support: During functional activity;No upper extremity supported Dynamic Standing - Level of Assistance: 6: Modified independent (Device/Increase time) Extremity Assessment      RLE Assessment RLE Assessment: Within Functional Limits General Strength Comments: 5/5 LLE Assessment LLE Assessment: Exceptions to [facility] General Strength Comments: Note motor impersistence, but strength greatly improved from baseline LLE Strength Left Hip Flexion: 3+/5 Left Knee Flexion: 4/5 Left Knee Extension: 4/5 Left Ankle Dorsiflexion: 4-/5 Left Ankle Plantar Flexion: 4-/5   Asriel Westrup C Hosanna Betley 11/29/2022, 1:49 PM

## 2022-11-30 ENCOUNTER — Other Ambulatory Visit (HOSPITAL_COMMUNITY): Payer: Self-pay

## 2022-11-30 DIAGNOSIS — M7522 Bicipital tendinitis, left shoulder: Secondary | ICD-10-CM | POA: Insufficient documentation

## 2022-11-30 DIAGNOSIS — J189 Pneumonia, unspecified organism: Secondary | ICD-10-CM | POA: Insufficient documentation

## 2022-11-30 DIAGNOSIS — M25512 Pain in left shoulder: Secondary | ICD-10-CM | POA: Insufficient documentation

## 2022-11-30 DIAGNOSIS — R3911 Hesitancy of micturition: Secondary | ICD-10-CM | POA: Insufficient documentation

## 2022-11-30 DIAGNOSIS — K5900 Constipation, unspecified: Secondary | ICD-10-CM | POA: Insufficient documentation

## 2022-11-30 MED ORDER — METHYLPREDNISOLONE 4 MG PO TBPK
ORAL_TABLET | ORAL | 0 refills | Status: DC
  Filled 2022-11-30: qty 21, 6d supply, fill #0

## 2022-11-30 NOTE — Progress Notes (Signed)
Inpatient Rehabilitation Discharge Medication Review by a Pharmacist  A complete drug regimen review was completed for this patient to identify any potential clinically significant medication issues.  High Risk Drug Classes Is patient taking? Indication by Medication  Antipsychotic No   Anticoagulant No   Antibiotic Yes Terbinafine  Opioid Yes Norco-pain  Antiplatelet Yes Aspirin-VTE px  Hypoglycemics/insulin No   Vasoactive Medication Yes Metoprolol- HTN  Chemotherapy No   Other Yes Diclofenac gel-pain Gabapentin-pain Miralax-constipation Senna-docusate-constipation Tamsulosin-BPH Flexeril-spasms Medrol-cervical myelopathy Omeprazole-GERD Atorvastatin-HLD     Type of Medication Issue Identified Description of Issue Recommendation(s)  Drug Interaction(s) (clinically significant)     Duplicate Therapy     Allergy     No Medication Administration End Date     Incorrect Dose     Additional Drug Therapy Needed     Significant med changes from prior encounter (inform family/care partners about these prior to discharge).    Other       Clinically significant medication issues were identified that warrant physician communication and completion of prescribed/recommended actions by midnight of the next day:  No  Name of provider notified for urgent issues identified:   Provider Method of Notification:     Pharmacist comments:   Time spent performing this drug regimen review (minutes):  20   Krystle Oberman A. Levada Dy, PharmD, BCPS, FNKF Clinical Pharmacist [place] Please utilize Amion for appropriate phone number to reach the unit pharmacist ([place])  11/30/2022 9:07 AM

## 2022-11-30 NOTE — Progress Notes (Signed)
Inpatient Rehabilitation Care Coordinator Discharge Note   Patient Details  Name: Johnny Robinson MRN: [id number] Date of Birth: 07/25/1966   Discharge location: D/c to home  Length of Stay: 14 days  Discharge activity level: Mod I  Home/community participation: Limited  Patient response [id number] Literacy - How often do you need to have someone help you when you read instructions, pamphlets, or other written material from your doctor or pharmacy?: Never  Patient response [id number] Isolation - How often do you feel lonely or isolated from those around you?: Never  Services provided included: MD, RD, PT, OT, RN, CM, TR, Pharmacy, Neuropsych, SW  Financial Services:  Charity fundraiser Utilized: [employer]  Choices offered to/list presented to: patient  Follow-up services arranged:  Outpatient, DME    Outpatient Servicies: [facility] for PT/OT DME : Pt purchased quad cane    Patient response to transportation need: Is the patient able to respond to transportation needs?: Yes In the past 12 months, has lack of transportation kept you from medical appointments or from getting medications?: No In the past 12 months, has lack of transportation kept you from meetings, work, or from getting things needed for daily living?: No   Comments (or additional information):  Patient/Family verbalized understanding of follow-up arrangements:  Yes  Individual responsible for coordination of the follow-up plan: contact pt #[phone] or pt [name]#[phone]  Confirmed correct DME delivered: Rana Snare 11/30/2022    Rana Snare

## 2022-11-30 NOTE — Progress Notes (Signed)
PROGRESS NOTE   Subjective/Complaints:   Pt reports Dr Annette Stable going to put him on Prednisone for 6 days- educated on side effects of prednisone-   L shoulder still painful-  ROS:  Pt denies SOB, abd pain, CP, N/V/C/D, and vision changes   Except for HPI    Objective:   No results found. No results for input(s): "WBC", "HGB", "HCT", "PLT" in the last 72 hours.   No results for input(s): "NA", "K", "CL", "CO2", "GLUCOSE", "BUN", "CREATININE", "CALCIUM" in the last 72 hours.    Intake/Output Summary (Last 24 hours) at 11/30/2022 0847 Last data filed at 11/30/2022 0804 Gross per 24 hour  Intake 840 ml  Output --  Net 840 ml         Physical Exam: Vital Signs Blood pressure 131/83, pulse 78, temperature 97.7 F (36.5 C), temperature source Oral, resp. rate 16, height 6\' 1"  (1.854 m), weight 94.2 kg, SpO2 96 %.         General: awake, alert, appropriate, wife at bedside; pt in bedside chair; NAD HENT: conjugate gaze; oropharynx moist CV: regular rate; no JVD Pulmonary: CTA B/L; no W/R/R- good air movement GI: soft, NT, ND, (+)BS Psychiatric: appropriate Neurological: Ox3  Ext: no clubbing, cyanosis, or edema- possible palpation of small hematoma in L tricep- smaller, per pt than yesterday Psych: pleasant and cooperative   Musculoskeletal: Has NEW L hand grasp - hard ot tell since using Saebo, but at least     Cervical back: Neck supple.  Neuro: RUE 5/5 in biceps, triceps, WE< grip and FA LUE- Biceps 4+/5; Triceps 4+/5; WE 3+/5, grip 2/5 and FA 0-1/5 RLE- 5/5 in HF, KE, KF DF and PF and EHL LLE- HF 3/5; KE 4-/5; KF 2/5; DF 4-/5, PF 4/5; EHL 4-/5 -  Skin:    General: Skin is warm and dry.     Comments: R handed IV Looks OK  Neurological:     Mental Status: He is alert and oriented to person, place, and time.     Comments: Sensation intact to light touch in all 4 extremities except L C8 distribution is  reduced Tone is normal in UE and LE   Assessment/Plan: 1. Functional deficits which require 3+ hours per day of interdisciplinary therapy in a comprehensive inpatient rehab setting. Physiatrist is providing close team supervision and 24 hour management of active medical problems listed below. Physiatrist and rehab team continue to assess barriers to discharge/monitor patient progress toward functional and medical goals  Care Tool:  Bathing    Body parts bathed by patient: Left arm, Chest, Abdomen, Front perineal area, Right upper leg, Left upper leg, Face, Right lower leg, Buttocks, Right arm   Body parts bathed by helper: Buttocks, Left lower leg, Right arm     Bathing assist Assist Level: Supervision/Verbal cueing     Upper Body Dressing/Undressing Upper body dressing   What is the patient wearing?: Pull over shirt    Upper body assist Assist Level: Independent with assistive device    Lower Body Dressing/Undressing Lower body dressing      What is the patient wearing?: Pants     Lower body assist Assist for  lower body dressing: Supervision/Verbal cueing     Toileting Toileting    Toileting assist Assist for toileting: Independent with assistive device     Transfers Chair/bed transfer  Transfers assist     Chair/bed transfer assist level: Independent with assistive device Chair/bed transfer assistive device: Research officer, political party   Ambulation assist      Assist level: Independent with assistive device Assistive device: Cane-quad Max distance: 1000   Walk 10 feet activity   Assist     Assist level: Independent with assistive device Assistive device: Cane-quad   Walk 50 feet activity   Assist    Assist level: Independent with assistive device Assistive device: Cane-quad    Walk 150 feet activity   Assist Walk 150 feet activity did not occur: Safety/medical concerns  Assist level: Independent with assistive device Assistive  device: Cane-quad    Walk 10 feet on uneven surface  activity   Assist Walk 10 feet on uneven surfaces activity did not occur: Safety/medical concerns   Assist level: Independent with assistive device Assistive device: Cane-quad   Wheelchair     Assist Is the patient using a wheelchair?: No Type of Wheelchair: Manual Wheelchair activity did not occur: N/A  Wheelchair assist level: Dependent - Patient 0%      Wheelchair 50 feet with 2 turns activity    Assist    Wheelchair 50 feet with 2 turns activity did not occur: N/A   Assist Level: Dependent - Patient 0%   Wheelchair 150 feet activity     Assist      Assist Level: Dependent - Patient 0%   Blood pressure 131/83, pulse 78, temperature 97.7 F (36.5 C), temperature source Oral, resp. rate 16, height 6\' 1"  (1.854 m), weight 94.2 kg, SpO2 96 %.  Medical Problem List and Plan: 1. Functional deficits secondary to Cervical myelopathy with L sided weakness s/p C3-6 ACDF             -patient may  shower- cover incision- will see if therapy can use Estim once assessed. No cancer hx.              -ELOS/Goals: 7-10 days- mod I to supervision  D/c 3/15  D/c today- educated on plan and d/w PA- Dr Annette Stable putting pt on 6 days of Prednisone- will send home on regimen 2.  Antithrombotics: -DVT/anticoagulation:  Mechanical: Sequential compression devices, below knee Bilateral lower extremities             Will see when can start Lovenox 3/4- Lovenox started Friday with OK from [facility]             -antiplatelet therapy: N/A 3. Pain Management: Hydrocodone and flexeril prn for pain   3/4- pain usually controlled- con't regimen- con't soft collar as well.   3/5- --Fexeril prn for muscle pain.              --gabapentin added BID to help LUE/LLE achy pain.  Pain well controlled- can stop use of soft collar per [facility]  3/6- throat less sore; overall pain controlled- con't regimen  3/7- having intense L neck pain -will do trigger  point injections- meds not helping  3/8- Having to wake up at night to take pain meds- set alarms to do so- says pain skyrockets if doesn't- con't regimen for now  3/9-10 changed to flexeril for trap/shoulder girdle spasm. He feels this is helping and would like to continue. Continue to hold baclofen  3/11- still wants  injections due to pain- will do tomorrow  3/12- done today  3/13- less pain in LUE since bicipital tendinitis and trigger point injections yesterday- not gone, but better  3/14- wil refer to either Dr Hulan Saas- the Sports medicine physician, or if cannot get in with him, will do Ortho-  4. Mood/Behavior/Sleep: LCSW to follow for evaluation and support.              3/5- mood in good spirits             -antipsychotic agents: N/A 5. Neuropsych/cognition: This patient is capable of making decisions on his own behalf. 6. Skin/Wound Care: Monitor wound for healing.  7. Fluids/Electrolytes/Nutrition: Monitor I/O. Check CMET in am.  8. Renal infarct//hx of CKD/HTN: Monitor BP TID--on metoprolol to keep SBP 90-100 range per wife             --was not taking Cozaar PTA.   3/9-10- BP well controlled- con't regimen 9. GERD: Continue PPI. 10. Labs: Wife reports no labs done pre-op.   --Recheck CMET/CBC in am.   3/4- labs look great- except Albumin of 2.9-  11. Constipation- LBM 3 days ago- not on anything scheduled- will schedule bowel meds- if no results by tomorrow, will do Sorbitol.   3/1- will give Sorbitol if no BM by 5pm.   3/4- had Mg citrate this weekend- had 1 small BM, but no more even with finishing mg citrate yesterday- will check KUB for possible ileus-   3/5- KUB showed small amount of stool- no ileus- did finally have good BM x2 and feels cleaned out.   3/10--had medium bm this morning but still feels full and not "cleaned out"   -re-order mg citrate  3/11- Was "cleaned out with mg citrate"  3/12 - will add Miralax so doesn't need Mg citrate, hopefully  3/13- pt  feels Mg citrate works, so doesn't feel need to change things up- asked him to try miralax to see  3/15- bowels going better 12. Multifocal pneumonia  3/5- started on Augmentin per PA- which I agree with- feeling good today   -WBC was 14.5- down to 8.9 after augmentin- will con't  3/6- educated on Flutter valve and ICS- also has some congestion, but feels better than 2 days ago 13. L neck and L shoulder/anterior/biceps tendinitis pain 3/7- pt needs trigger point injections- will do today at 10 am.    3/8- Neck pain much better- still having L shoulder and L biceps pain- will need to do additional injection next Tuesday- since I'm in clinic Friday/Monday  3/9-10 reports ongoing trap pain today in addition to above.    -flexeril trial as above in #3  3/11- will do TrP injections as well as L biceps tendinitis steroid injection on Tuesday  3.12 - done today  3/13- pain better- just not resolved yet- down to 4/10  3/14- pain still an issues- more bothersome at night- will try to place referral for Dr [facility]ulan Sa[facility] at [facility] area- for L shoulder pain-   3/15- educated that did trigger point injections and L biceps tendonitis injections- not shoulder injections, per se.   Patient here for trigger point injections for  Consent done and on chart.  Cleaned areas with alcohol and injected using a 27 gauge 1.5 inch needle  11/27/22 Injected 3cc- no wastage Using 1% Lidocaine with no EPI  Upper traps= L x2 Levators Posterior scalenes Middle scalenes Splenius Capitus Pectoralis Major Rhomboids Infraspinatus Teres Major/minor Thoracic paraspinals  Lumbar paraspinals Other injections- L biceps and L triceps   Patient's level of pain prior was Current level of pain after injections is  There was no bleeding or complications.  Patient was advised to drink a lot of water on day after injections to flush system Will have increased soreness for 12-48 hours after injections.  Can use  Lidocaine patches the day AFTER injections Can use theracane on day of injections in places didn't inject Can use heating pad 4-6 hours AFTER injections  11/27/22 steroid injection was performed at L bicipital tendons origin using 1% plain Lidocaine and 40mg  /1cc of Kenalog. This was well tolerated.  Cleaned with betadine x3 and allowed to dry- then alcohol then injected using 27 gauge 1.5 inch needle- no bleeding or complications.    F/U in 3 months for steroid injections of L biceps tendons prn Lidocaine will kick in 15 minutes- and wear off tonight- the steroid will kick in tomorrow within 24 hours and take up to 72 hours to fully kick in.   I spent a total of 33   minutes on total care today- >50% coordination of care- due to  D/w PA about referral for L shoulder- either Dr Hulan Saas Sports medicine [facility]- or Ortho if cannot get in with Dr Tamala Julian soon.  Educated pt that got trigger point injections and bicipital tendonitis injections, not actual "shoulder" injections- also educate don prednisone side effects  LOS: 15 days A FACE TO FACE EVALUATION WAS PERFORMED  Johnny Robinson 11/30/2022, 8:47 AM

## 2022-11-30 NOTE — Progress Notes (Signed)
INPATIENT REHABILITATION DISCHARGE NOTE   Discharge instructions by: Algis Liming, PA  Verbalized understanding: yes  Skin care/Wound care healing? yes  Pain: medicated  IV's: none  Tubes/Drains: none  O2: none  Safety instructions: reviewed with pt and family  Patient belongings: sent with pt  Discharged to: home  Discharged via: family transport  Notes: done   Gerald Stabs, Therapist, sports

## 2022-12-05 NOTE — Therapy (Signed)
OUTPATIENT PHYSICAL THERAPY NEURO EVALUATION   Patient Name: Johnny Robinson MRN: [id number] DOB:[birth year], 57 y.o., male Today's Date: 12/06/2022   PCP:  Idelle Crouch, MD   REFERRING PROVIDER: Bary Leriche, PA-C  END OF SESSION:  PT End of Session - 12/06/22 1234     Visit Number 1    Number of Visits 13    Date for PT Re-Evaluation 01/17/23    Authorization Type [facility] [employer]    Authorization - Visit Number 1    Authorization - Number of Visits 25    PT Start Time [id number]    PT Stop Time 1015    PT Time Calculation (min) 40 min    Equipment Utilized During Treatment Gait belt    Activity Tolerance Patient tolerated treatment well    Behavior During Therapy WFL for tasks assessed/performed             Past Medical History:  Diagnosis Date   Arthritis    Chronic kidney disease 2016   RENAL INFARCT   GERD (gastroesophageal reflux disease)    Hypertension    Reflux    Subdural hematoma ([place]) 1991   Past Surgical History:  Procedure Laterality Date   BACK SURGERY  11/2017   burr hole  1992   to evacuate SDH   COLONOSCOPY WITH PROPOFOL N/A 09/02/2018   Procedure: COLONOSCOPY WITH PROPOFOL;  Surgeon: Lucilla Lame, MD;  Location: [facility] END[facility]COPY;  Service: Endoscopy;  Laterality: N/A;   EXCISION MASS ABDOMINAL Left 05/28/2018   Procedure: EXCISIONAL BIOPSY MASS ABDOMINAL WALL;  Surgeon: Herbert Pun, MD;  Location: [facility] ORS;  Service: General;  Laterality: Left;   PERIPHERAL VASCULAR CATHETERIZATION Right 05/18/2015   Procedure: Renal Angiography;  Surgeon: Algernon Huxley, MD;  Location: [place] [facility];  Service: Cardiovascular;  Laterality: Right;   PERIPHERAL VASCULAR CATHETERIZATION Bilateral 05/18/2015   Procedure: Renal Intervention;  Surgeon: Algernon Huxley, MD;  Location: [place] [facility];  Service: Cardiovascular;  Laterality: Bilateral;   TOTAL HIP ARTHROPLASTY Left 03/07/2022   Procedure: TOTAL HIP ARTHROPLASTY ANTERIOR APPROACH;  Surgeon:  Gaynelle Arabian, MD;  Location: [facility];  Service: Orthopedics;  Laterality: Left;   Patient Active Problem List   Diagnosis Date Noted   Left shoulder pain 11/30/2022   Urinary hesitancy 11/30/2022   Constipation 11/30/2022   Biceps tendinitis of left shoulder 11/30/2022   Multifocal pneumonia 11/30/2022   Chronic pain syndrome 11/21/2022   HNP (herniated nucleus pulposus) with myelopathy, cervical 11/15/2022   Cervical myelopathy ([place]) 11/13/2022   OA (osteoarthritis) of hip 03/07/2022   Primary osteoarthritis of left hip 03/07/2022   GERD (gastroesophageal reflux disease) 11/27/2021   Encounter for screening colonoscopy    Benign neoplasm of rectosigmoid junction    Hyperlipidemia 12/18/2017   Essential hypertension 12/17/2016   Chest pain 05/30/2015   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 Feeling a little more fatigued than normal. Feels that if he was working full time right now it would be very challenging. 11/12/22 was date of neck fusion. Feels that he is now worse than before surgery. Reports L neck and UE pain is his main symptom. Notes weakness in the L UE but denies N/T. Had problems with weakness of the L LE but was able to progress this in rehab. Using quad cane indoors/outdoors but not using device at PLOF. Notes L LE buckling and was using AFO while in rehab but this was discontinued.   Pt accompanied by: self  PERTINENT  HISTORY: CKD, GERD, HTN, subdural hematoma 1991, back surgery 2019, L THA 2023  PAIN:  Are you having pain? Yes: NPRS scale: 4-5/10 Pain location: L neck down the elbow Pain description: ache Aggravating factors: raising arm Relieving factors: meds  PRECAUTIONS: Cervical and Fall  WEIGHT BEARING RESTRICTIONS: No  FALLS: Has patient fallen in last 6 months? No  LIVING ENVIRONMENT: Lives with: lives with their spouse Lives in: House/apartment Stairs:  4 steps to enter without handrail; 3 story home  Has following equipment at home: Lobbyist, Environmental consultant - 2 wheeled, Electronics engineer, and Grab bars  PLOF: Independent, Vocation/Vocational requirements: standing, walking, some desk work, and Leisure: tennis (has not done this since [place] 2023)  PATIENT GOALS: return to tennis and fishing   OBJECTIVE:   DIAGNOSTIC FINDINGS: 11/13/22 cervical MRI:  Postoperative changes from prior ACDF at C3 through C6 without significant residual spinal stenosis.Patchy signal abnormality involving the central and left cord at the level of C5-6, concerning for contusion/injury.  11/23/22 cervical xray: Prior ACDF from C3-C6. Intact hardware without evidence of loosening. Persistent prevertebral soft tissue swelling.Mild degenerative disc disease at C6-C7. Mild to moderate multilevel facet arthropathy.  COGNITION: Overall cognitive status: Within functional limits for tasks assessed   SENSATION: WFL  COORDINATION: Alternating pronation/supination: limited ROM on L and slow Alternating toe tap: intact B Finger to nose: unable on L d/t weakness, R UE intact Heel to shin: limited ROM on L d/t weakness    MUSCLE TONE: intact B LEs   POSTURE: rounded shoulders and flexed trunk   LOWER EXTREMITY ROM:     Active  Right Eval Left Eval  Hip flexion    Hip extension    Hip abduction    Hip adduction    Hip internal rotation    Hip external rotation    Knee flexion    Knee extension     Ankle dorsiflexion 20 3  Ankle plantarflexion    Ankle inversion    Ankle eversion     (Blank rows = not tested)  LOWER EXTREMITY MMT:    MMT (in sitting) Right Eval Left Eval  Hip flexion 4+ 4-  Hip extension    Hip abduction 4 4-  Hip adduction 4 4-  Hip internal rotation    Hip external rotation    Knee flexion 5 4-  Knee extension 5 4  Ankle dorsiflexion 4+ 4  Ankle plantarflexion 4 4-  Ankle inversion    Ankle eversion    (Blank rows = not tested)   GAIT: Gait pattern: Decreased R step length, slightly decreased L knee flexion during swing through, good foot clearance and step through pattern Assistive device utilized: None Level of assistance: Modified independence   FUNCTIONAL TESTS:   [facility] PT Assessment - 12/06/22 0001  Standardized Balance Assessment   Standardized Balance Assessment Five Times Sit to Stand;10 meter walk test    Five times sit to stand comments  12.31 sec pushing off knees      Functional Gait  Assessment   Gait assessed  Yes    Gait Level Surface Walks 20 ft in less than 7 sec but greater than 5.5 sec, uses assistive device, slower speed, mild gait deviations, or deviates 6-10 in outside of the 12 in walkway width.    Change in Gait Speed Able to change speed, demonstrates mild gait deviations, deviates 6-10 in outside of the 12 in walkway width, or no gait deviations, unable to achieve a major change in velocity, or uses a change in velocity, or uses an assistive device.    Gait with Horizontal Head Turns Performs head turns smoothly with slight change in gait velocity (eg, minor disruption to smooth gait path), deviates 6-10 in outside 12 in walkway width, or uses an assistive device.    Gait with Vertical Head Turns Performs task with slight change in gait velocity (eg, minor disruption to smooth gait path), deviates 6 - 10 in outside 12 in walkway width or uses assistive device    Gait and Pivot Turn Pivot turns safely within 3 sec  and stops quickly with no loss of balance.    Step Over Obstacle Is able to step over one shoe box (4.5 in total height) but must slow down and adjust steps to clear box safely. May require verbal cueing.    Gait with Narrow Base of Support Is able to ambulate for 10 steps heel to toe with no staggering.    Gait with Eyes Closed Walks 20 ft, no assistive devices, good speed, no evidence of imbalance, normal gait pattern, deviates no more than 6 in outside 12 in walkway width. Ambulates 20 ft in less than 7 sec.    Ambulating Backwards Walks 20 ft, no assistive devices, good speed, no evidence for imbalance, normal gait    Steps Alternating feet, must use rail.    Total Score 23               TODAY'S TREATMENT:                                                                                                                              DATE: 12/06/22    PATIENT EDUCATION: Education details: prognosis, POC, HEP Person educated: Patient Education method: Explanation, Demonstration, Tactile cues, Verbal cues, and Handouts Education comprehension: verbalized understanding  HOME EXERCISE PROGRAM: Access Code: [email or web address] URL: [email or web address] Date: 12/06/2022 Prepared by: [facility]  Exercises - Seated Hamstring Curl with Anchored Resistance  - 1 x daily - 5 x weekly - 2 sets - 10 reps - Marching with Resistance  - 1 x daily - 5 x weekly - 2 sets - 10 reps - Squat with Chair Touch  -  1 x daily - 5 x weekly - 2 sets - 10 reps - Heel Raises with Counter Support  - 1 x daily - 5 x weekly - 2 sets - 10 reps   GOALS: Goals reviewed with patient? Yes  SHORT TERM GOALS: Target date: 12/27/2022  Patient to be independent with initial HEP. Baseline: HEP initiated Goal status: INITIAL    LONG TERM GOALS: Target date: 01/17/2023  Patient to be independent with advanced HEP. Baseline: Not yet initiated  Goal status:  INITIAL  Patient to demonstrate B LE strength >/=4+/5.  Baseline: See above Goal status: INITIAL  Patient to demonstrate alternating reciprocal pattern when ascending and descending stairs with good stability and no handrail.  Baseline: requires handrail Goal status: INITIAL  Patient to score at least 26/30 on FGA in order to decrease risk of falls.  Baseline: 23/30 Goal status: INITIAL  Patient to demonstrate symmetrical step length and knee flexion during swing phase with ambulation without AD with good stability.  Baseline: unable Goal status: INITIAL  Patient to return to exercise regimen with modifications as needed.  Baseline: not initiated Goal status: INITIAL    ASSESSMENT:  CLINICAL IMPRESSION:  Patient is a 57 y/o F presenting to [facility] with c/o weakness s/p C3-6 ACDF for cervical myelopathy on 11/12/22. Patient participated in [facility]  11/15/22-11/30/22 and was D/C'd home. Patient today presenting with rounded shoulders and flexed trunk in standing, limited L ankle DF AROM, marked L LE and UE weakness, gait deviations, and mild imbalance. Prior to current episode, patient was independent. Would like to return to tennis and fishing. Patient was educated on gentle strengthening HEP and reported understanding. Would benefit from skilled PT services 1-2 x/week for 6 weeks to address aforementioned impairments in order to optimize level of function.    OBJECTIVE IMPAIRMENTS: Abnormal gait, decreased activity tolerance, decreased balance, decreased endurance, difficulty walking, decreased ROM, decreased strength, improper body mechanics, postural dysfunction, and pain.   ACTIVITY LIMITATIONS: carrying, lifting, standing, squatting, stairs, transfers, bathing, toileting, dressing, reach over head, hygiene/grooming, locomotion level, and caring for others  PARTICIPATION LIMITATIONS: meal prep, cleaning, laundry, driving, shopping, community activity, occupation, yard work, and  church  PERSONAL FACTORS: Age, Fitness, Past/current experiences, Time since onset of injury/illness/exacerbation, and 3+ comorbidities: CKD, GERD, HTN, subdural hematoma 1991, back surgery 2019, L THA 2023, C3-6 ACDF 11/12/22  are also affecting patient's functional outcome.   REHAB POTENTIAL: Good  CLINICAL DECISION MAKING: Evolving/moderate complexity  EVALUATION COMPLEXITY: Moderate  PLAN:  PT FREQUENCY: 1-2x/week  PT DURATION: 6 weeks  PLANNED INTERVENTIONS: Therapeutic exercises, Therapeutic activity, Neuromuscular re-education, Balance training, Gait training, Patient/Family education, Self Care, Joint mobilization, Stair training, Vestibular training, Canalith repositioning, Aquatic Therapy, Dry Needling, Electrical stimulation, Cryotherapy, Moist heat, Taping, Manual therapy, and Re-evaluation  PLAN FOR NEXT SESSION: review HEP; progress L LE strengthening, high level balance, stairs    Janene Harvey, [place], DPT 12/06/22 12:44 PM  [facility] [address], [place], [place] [address] Phone # [phone] Fax # [phone]

## 2022-12-05 NOTE — Progress Notes (Unsigned)
[facility]ach Joycelyn [facility] [place] [address] [place] Phone: [phone] Subjective:   IVilma Meckel, am serving as a scribe for Dr. Hulan Saas.  I'm seeing this patient by the request  of:  Idelle Crouch, MD  CC: left shoulder pain   [id number]  Johnny Robinson is a 57 y.o. male coming in with complaint of L shoulder pain patient recently undergone a another neck surgery by Dr. Trenton Gammon with hospitalization for long-term rehabilitation from February 29 till march  15.  Had difficulty with functional use of the left upper extremity. After surgery L side was paralyzed. Has been doing PT and OT. Not sure what caused the inflammation. Has knots L trap down shoulder to elbow. Has gotten bicep injections. Sharp pain with are movement in wrong direction, but constant ache. Limited ROM in L arm. On hydrocodone.  Patient did have significant cord edema noted on posterior MRI at C5-C6 consistent with a contusion.  Past Medical History:  Diagnosis Date   Arthritis    Chronic kidney disease 2016   RENAL INFARCT   GERD (gastroesophageal reflux disease)    Hypertension    Reflux    Subdural hematoma ([place]) 1991   Past Surgical History:  Procedure Laterality Date   BACK SURGERY  11/2017   burr hole  1992   to evacuate SDH   COLONOSCOPY WITH PROPOFOL N/A 09/02/2018   Procedure: COLONOSCOPY WITH PROPOFOL;  Surgeon: Lucilla Lame, MD;  Location: [facility] ENDOSCOPY;  Service: Endoscopy;  Laterality: N/A;   EXCISION MASS ABDOMINAL Left 05/28/2018   Procedure: EXCISIONAL BIOPSY MASS ABDOMINAL WALL;  Surgeon: Herbert Pun, MD;  Location: [facility] ORS;  Service: General;  Laterality: Left;   PERIPHERAL VASCULAR CATHETERIZATION Right 05/18/2015   Procedure: Renal Angiography;  Surgeon: Algernon Huxley, MD;  Location: [place] [facility];  Service: Cardiovascular;  Laterality: Right;   PERIPHERAL VASCULAR CATHETERIZATION Bilateral 05/18/2015   Procedure: Renal Intervention;   Surgeon: Algernon Huxley, MD;  Location: [place] [facility];  Service: Cardiovascular;  Laterality: Bilateral;   TOTAL HIP ARTHROPLASTY Left 03/07/2022   Procedure: TOTAL HIP ARTHROPLASTY ANTERIOR APPROACH;  Surgeon: Gaynelle Arabian, MD;  Location: [facility] ORS;  Service: Orthopedics;  Laterality: Left;   Social History   Socioeconomic History   Marital status: Married    Spouse name: [name]   Number of children: 3   Years of education: JD   Highest education level: Professional school degree (e.g., MD, DDS, DVM, JD)  Occupational History   Occupation: Chief Executive Officer  Tobacco Use   Smoking status: Never   Smokeless tobacco: Never  Vaping Use   Vaping Use: Never used  Substance and Sexual Activity   Alcohol use: Yes    Comment: occasional   Drug use: No   Sexual activity: Yes    Birth control/protection: None  Other Topics Concern   Not on file  Social History Narrative   Not on file   Social Determinants of Health   Financial Resource Strain: Not on file  Food Insecurity: No Food Insecurity (11/13/2022)   Hunger Vital Sign    Worried About Running Out of Food in the Last Year: Never true    Ran Out of Food in the Last Year: Never true  Transportation Needs: No Transportation Needs (11/13/2022)   PRAPARE - Hydrologist (Medical): No    Lack of Transportation (Non-Medical): No  Physical Activity: Not on file  Stress: Not on file  Social Connections: Not on file   No Known Allergies Family History  Problem Relation Age of Onset   Nephrolithiasis Mother    Cancer Father     Current Outpatient Medications (Endocrine & Metabolic):    methylPREDNISolone (MEDROL DOSEPAK) 4 MG TBPK tablet, Take 6 tabs by mouth on day 1, 5 tabs on day 2, 4 tabs on day 3, 3 tabs on day 4, 2 tabs on day 5, and 1 tab on day 6. (Patient not taking: Reported on 12/06/2022)  Current Outpatient Medications (Cardiovascular):    atorvastatin (LIPITOR) 20 MG tablet, Take 1 tablet (20 mg  total) by mouth once daily   metoprolol succinate (TOPROL-XL) 25 MG 24 hr tablet, Take 1 tablet (25 mg total) by mouth once daily   Current Outpatient Medications (Analgesics):    aspirin EC 81 MG tablet, Take 81 mg by mouth daily. (Patient not taking: Reported on 11/14/2022)   HYDROcodone-acetaminophen (NORCO) 10-325 MG tablet, Take 1-2 tablets by mouth every 6 (six) hours as needed for severe pain.   Current Outpatient Medications (Other):    DULoxetine (CYMBALTA) 20 MG capsule, Take 1 capsule (20 mg total) by mouth daily.   gabapentin (NEURONTIN) 300 MG capsule, Take 1 capsule (300 mg total) by mouth at bedtime.   cyclobenzaprine (FLEXERIL) 5 MG tablet, Take 1 tablet (5 mg total) by mouth 3 (three) times daily as needed for muscle spasms.   diclofenac Sodium (VOLTAREN) 1 % GEL, Apply 4 g topically 4 (four) times daily.   gabapentin (NEURONTIN) 100 MG capsule, Take 1 capsule (100 mg total) by mouth 2 (two) times daily. (Patient taking differently: Take 100 mg by mouth 4 (four) times daily.)   Multiple Vitamin (MULTIVITAMIN WITH MINERALS) TABS tablet, Take 1 tablet by mouth in the morning.   omeprazole (PRILOSEC) 40 MG capsule, TAKE ONE CAPSULE BY MOUTH EVERY MORNING (Patient taking differently: Take 40 mg by mouth daily.)   polyethylene glycol powder (GLYCOLAX/MIRALAX) 17 GM/SCOOP powder, Take 17 g by mouth daily.   senna-docusate (SENOKOT-S) 8.6-50 MG tablet, Take 2 tablets by mouth daily with supper.   tamsulosin (FLOMAX) 0.4 MG CAPS capsule, Take 1 capsule (0.4 mg total) by mouth daily.   terbinafine (LAMISIL) 250 MG tablet, Take 1 tablet (250 mg total) by mouth daily.   Reviewed prior external information including notes and imaging from  primary care provider As well as notes that were available from care everywhere and other healthcare systems.  This includes patient's hospitalization as stated above   Review of Systems:  No headache, visual changes, nausea, vomiting, diarrhea,  constipation, dizziness, abdominal pain, skin rash, fevers, chills, night sweats, weight loss, swollen lymph nodes, body aches, joint swelling, chest pain, shortness of breath, mood changes. POSITIVE muscle aches  Objective  Blood pressure 108/68, pulse 93, height 6\' 1"  (1.854 m), weight 193 lb (87.5 kg), SpO2 96 %.   General: No apparent distress alert and oriented x3 mood and affect normal, dressed appropriately.  HEENT: Pupils equal, extraocular movements intact  Respiratory: Patient's speak in full sentences and does not appear short of breath  Cardiovascular: No lower extremity edema, non tender, no erythema  Left shoulder exam shows patient does have some weakness noted.  Does have some extremity swelling noted of the left arm compared to the contralateral side.  Patient does have atrophy also noted of the forearm compared to the contralateral side.  Significant weakness noted of the hand including the C7 and C8 distributions as well.  Left shoulder  does have some weakness in the shoulder girdle area.  Patient has difficulty with full extension.  Patient does have limited actually supination of the forearm and is significantly weak with 1+ grip strength compared to the contralateral side.  Limited muscular skeletal ultrasound was performed and interpreted by Hulan Saas, M  Limited ultrasound of patient's left shoulder mild atrophy noted of the muscles surrounding the shoulder including the deltoid.  Patient's rotator cuff appears to be intact no.  No hypoechoic changes consistent with tearing. Impression: Relatively normal rotator cuff with some atrophy    Impression and Recommendations:     The above documentation has been reviewed and is accurate and complete Lyndal Pulley, DO

## 2022-12-06 ENCOUNTER — Other Ambulatory Visit: Payer: Self-pay

## 2022-12-06 ENCOUNTER — Encounter: Payer: Self-pay | Admitting: Physical Therapy

## 2022-12-06 ENCOUNTER — Other Ambulatory Visit (HOSPITAL_COMMUNITY): Payer: Self-pay

## 2022-12-06 ENCOUNTER — Ambulatory Visit: Payer: Commercial Managed Care - PPO | Attending: Physical Medicine and Rehabilitation | Admitting: Occupational Therapy

## 2022-12-06 ENCOUNTER — Ambulatory Visit: Payer: Commercial Managed Care - PPO | Admitting: Physical Therapy

## 2022-12-06 ENCOUNTER — Ambulatory Visit: Payer: Commercial Managed Care - PPO | Admitting: Family Medicine

## 2022-12-06 ENCOUNTER — Encounter: Payer: Self-pay | Admitting: Family Medicine

## 2022-12-06 VITALS — BP 108/68 | HR 93 | Ht 73.0 in | Wt 193.0 lb

## 2022-12-06 DIAGNOSIS — G959 Disease of spinal cord, unspecified: Secondary | ICD-10-CM

## 2022-12-06 DIAGNOSIS — M79642 Pain in left hand: Secondary | ICD-10-CM | POA: Diagnosis not present

## 2022-12-06 DIAGNOSIS — M79602 Pain in left arm: Secondary | ICD-10-CM | POA: Insufficient documentation

## 2022-12-06 DIAGNOSIS — R29818 Other symptoms and signs involving the nervous system: Secondary | ICD-10-CM | POA: Insufficient documentation

## 2022-12-06 DIAGNOSIS — R278 Other lack of coordination: Secondary | ICD-10-CM | POA: Insufficient documentation

## 2022-12-06 DIAGNOSIS — R2689 Other abnormalities of gait and mobility: Secondary | ICD-10-CM | POA: Insufficient documentation

## 2022-12-06 DIAGNOSIS — R2681 Unsteadiness on feet: Secondary | ICD-10-CM | POA: Insufficient documentation

## 2022-12-06 DIAGNOSIS — R29898 Other symptoms and signs involving the musculoskeletal system: Secondary | ICD-10-CM

## 2022-12-06 DIAGNOSIS — M6281 Muscle weakness (generalized): Secondary | ICD-10-CM | POA: Insufficient documentation

## 2022-12-06 DIAGNOSIS — R208 Other disturbances of skin sensation: Secondary | ICD-10-CM | POA: Insufficient documentation

## 2022-12-06 DIAGNOSIS — M25512 Pain in left shoulder: Secondary | ICD-10-CM | POA: Diagnosis not present

## 2022-12-06 MED ORDER — DULOXETINE HCL 20 MG PO CPEP
20.0000 mg | ORAL_CAPSULE | Freq: Every day | ORAL | 0 refills | Status: DC
  Filled 2022-12-06: qty 30, 30d supply, fill #0

## 2022-12-06 MED ORDER — GABAPENTIN 300 MG PO CAPS
300.0000 mg | ORAL_CAPSULE | Freq: Every day | ORAL | 0 refills | Status: DC
  Filled 2022-12-06: qty 90, 90d supply, fill #0

## 2022-12-06 NOTE — Therapy (Signed)
OUTPATIENT OCCUPATIONAL THERAPY NEURO EVALUATION  Patient Name: Johnny Robinson MRN: [id number] DOB:1966-04-06, 57 y.o., male Today's Date: 12/06/2022  PCP: Idelle Crouch, MD REFERRING PROVIDER: Bary Leriche, PA-C  END OF SESSION:  OT End of Session - 12/06/22 0910     Visit Number 1    Number of Visits 17    Date for OT Re-Evaluation 02/01/23    Authorization Type [employer] employee - [employer] PPO    OT Start Time [phone]    OT Stop Time [phone]    OT Time Calculation (min) 47 min             Past Medical History:  Diagnosis Date   Arthritis    Chronic kidney disease 2016   RENAL INFARCT   GERD (gastroesophageal reflux disease)    Hypertension    Reflux    Subdural hematoma ([place]) 1991   Past Surgical History:  Procedure Laterality Date   BACK SURGERY  11/2017   burr hole  1992   to evacuate SDH   COLONOSCOPY WITH PROPOFOL N/A 09/02/2018   Procedure: COLONOSCOPY WITH PROPOFOL;  Surgeon: Lucilla Lame, MD;  Location[facility] ENDOSCOPY;  Service: Endoscopy;  Laterality: N/A;   EXCISION MASS ABDOMINAL Left 05/28/2018   Procedure: EXCISIONAL BIOPSY MASS ABDOMINAL WALL;  Surgeon: Herbert Pun, MD;  Location: [facility] ORS;  Service: General;  Laterality: Left;   PERIPHERAL VASCULAR CATHETERIZATION Right 05/18/2015   Procedure: Renal Angiography;  Surgeon: Algernon Huxley, MD;  Location: [place] [facility];  Service: Cardiovascular;  Laterality: Right;   PERIPHERAL VASCULAR CATHETERIZATION Bilateral 05/18/2015   Procedure: Renal Intervention;  Surgeon: Algernon Huxley, MD;  Location: [place] [facility];  Service: Cardiovascular;  Laterality: Bilateral;   TOTAL HIP ARTHROPLASTY Left 03/07/2022   Procedure: TOTAL HIP ARTHROPLASTY ANTERIOR APPROACH;  Surgeon: Gaynelle Arabian, MD;  Location: [facility] ORS;  Service: Orthopedics;  Laterality: Left;   Patient Active Problem List   Diagnosis Date Noted   Left shoulder pain 11/30/2022   Urinary hesitancy 11/30/2022   Constipation 11/30/2022    Biceps tendinitis of left shoulder 11/30/2022   Multifocal pneumonia 11/30/2022   Chronic pain syndrome 11/21/2022   HNP (herniated nucleus pulposus) with myelopathy, cervical 11/15/2022   Cervical myelopathy ([place]) 11/13/2022   OA (osteoarthritis) of hip 03/07/2022   Primary osteoarthritis of left hip 03/07/2022   GERD (gastroesophageal reflux disease) 11/27/2021   Encounter for screening colonoscopy    Benign neoplasm of rectosigmoid junction    Hyperlipidemia 12/18/2017   Essential hypertension 12/17/2016   Chest pain 05/30/2015   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  Pt was in [place] for 3 weeks, d/c home 11/30/22.  Pt reports improving with mobility, but noticing hand is moving a little more slowly than leg.  Pt would like to make similar progress with arm as he has in leg.  Pt reports that he has been working with the therapy putty and is noticing improvements since d/c from hospital.  Pt reports that  he is not doing as much "therapy" per day but that he is doing more functionally since being home.  Pt states that hs is using Sa[place]o 1 hr/day with focus on finger extension, flexion, and thumb movements. 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 Dr. Annette Stable expressed concerns of cord contusion due to worsening of myelopathy and MRI spine done which showed post op changes form ACDF C3-C6 and patchy signal abnormality involving the central and left cord at C5/6 concerning for contusion/injury.  PRECAUTIONS: Cervical and Other: lifting precautions not > 5#  WEIGHT BEARING RESTRICTIONS: No  PAIN:  Are you having pain? Yes: NPRS scale: 6-7/10 Pain location: from neck to L elbow Pain description: constant Aggravating factors: not staying on schedule pain pills Relieving factors: taking pain pills  FALLS: Has patient fallen in last 6 months? No  LIVING ENVIRONMENT: Lives with: lives with their spouse and 69 yo Lives in: House/apartment Stairs: Yes: Internal: full flight steps; on right going up and External: 4 steps; none Has following equipment at home: Quad cane small base, Walker - 2 wheeled, shower chair, bed side commode, and hand held shower head  PLOF: Independent, Independent with basic ADLs, and Vocation/Vocational requirements: practices law  PATIENT GOALS: pt would like to get arm progressing like leg   OBJECTIVE:   HAND DOMINANCE: Right  ADLs: Overall ADLs: requires increased time and thought to complete Transfers/ambulation related to ADLs: Mod I with SBQC Eating: requires assistance to cut food Grooming: Mod I UB Dressing: requiring assistance with buttons LB Dressing: requiring assistance with socks/tying shoes Toileting: Mod I Bathing: Mod I Tub Shower transfers: Mod I, has built in shower seat Equipment: Walk in shower  IADLs: Light housekeeping: has attempted vacuuming without issue Meal Prep: has attempted cooking, spouse or children assisting with retrieving items if >5# Medication management: spouse is filling pillbox and  is reminding him to take  MOBILITY STATUS: Needs Assist: Mod I - Supervision with SBQC  POSTURE COMMENTS:  rounded shoulders and forward head  FUNCTIONAL OUTCOME MEASURES: Upper Extremity Functional Scale (UEFS): 33/80 =  41.25% function  UPPER EXTREMITY ROM:    Active ROM Right eval Left eval  Shoulder flexion  88 (requiring abduction to achieve)  Shoulder abduction  85  Shoulder adduction    Shoulder extension    Shoulder internal rotation  90%  Shoulder external rotation  30%  Elbow flexion  100  Elbow extension  WNL  Wrist flexion  WNL  Wrist extension  To neutral  Wrist ulnar deviation  trace  Wrist radial deviation  trace  Wrist pronation    Wrist supination  To neutral  (Blank rows = not tested)  UPPER EXTREMITY MMT:     MMT Right eval Left eval  Shoulder flexion  2-/5  Shoulder abduction    Shoulder adduction    Shoulder extension    Shoulder internal rotation    Shoulder external rotation    Middle trapezius    Lower trapezius    Elbow flexion  4-/5  Elbow extension  4-/5  Wrist flexion  3-/5  Wrist extension  3-/5  Wrist ulnar  deviation    Wrist radial deviation    Wrist pronation    Wrist supination    (Blank rows = not tested)  HAND FUNCTION: Loose gross grasp, unable to assess strength on dynamometer  COORDINATION: Finger Nose Finger test: able to reach chin to finger with LUE, decreased shoulder ROM limiting movement 9 Hole Peg test: Right: 23.03 sec; Left: able to place 5 pegs in 2 min time limit (pt needing to use side of container/multiple pegs to leverage to pick up one peg, also with difficulty with manipulation/translation in finger tips to orient to place in peg hole) Box and Blocks:  Right 44 blocks, Left 32 blocks  SENSATION: Reports duller sensation on L compared to R  COGNITION: Overall cognitive status: Within functional limits for tasks assessed  VISION: Subjective report: no changes Baseline vision: Wears glasses for  reading only   TODAY'S TREATMENT:                                                                                                                              NA, eval only  PATIENT EDUCATION: Education details: Educated on role and purpose of OT as well as potential interventions and goals for therapy based on initial evaluation findings. Person educated: Patient Education method: Explanation Education comprehension: verbalized understanding and needs further education  HOME EXERCISE PROGRAM: TBD   GOALS: Goals reviewed with patient? Yes  SHORT TERM GOALS: Target date: 01/04/23  Pt will be independent with coordination and ROM HEP. Baseline: Goal status: INITIAL  2.  Pt will demonstrate increased coordination to be able to place all 9 pegs into peg board during 9 hole peg test in 2 min time limit. Baseline: able to place 5 in 2 min time limit Goal status: INITIAL  3.  Pt will demonstrate increased L shoulder ROM to allow for ability to retreive light weight object from shoulder height cabinet. Baseline: 88* shoulder flexion with abduction Goal status: INITIAL  4.  Pt will demonstrate improved pinch and coordination as needed to complete clothing fasteners (buttons, tying shoes, etc). Baseline:  Goal status: INITIAL   LONG TERM GOALS: Target date: 02/01/23  Pt will be independent with advanced HEP. Baseline:  Goal status: INITIAL  2.  Pt will demonstrate improved UE functional use for ADLs as evidenced by increasing box/ blocks score by 6 blocks with LUE Baseline: Right 44 blocks, Left 32 blocks Goal status: INITIAL  3.  Pt will demonstrate improved fine motor coordination for ADLs as evidenced by being able to complete 9 hole peg test (place and remove) in 2 min time limit Baseline: able to place 5 in 2 min time limit Goal status: INITIAL  4.  Pt will demonstrate increased L shoulder ROM and strength to tolerate holding phone to ear for 5 mins phone  conversation. Baseline:  Goal status: INITIAL  5.  Pt will report improved LUE functional use as evidenced by increased score on UEFS to >  60%. Baseline: 41.25% Goal status: INITIAL    ASSESSMENT:  CLINICAL IMPRESSION: Patient is a 57 y.o. male who was seen today for occupational therapy evaluation for LUE weakness/impaired sensation s/p C3-C6 ACDF for cervical myelopathy. Pt with decreased shoulder flexion, forearm supination, and decreased functional pinch and grip impacting participation in ADLs and IADLs.  Pt currently lives with spouse and teenage son in a 2 story home with bedroom upstairs and half bath on main floor and was working in a [employer] prior to onset. Pt will benefit from skilled occupational therapy services to address strength and coordination, ROM, pain management, altered sensation, balance, GM/FM control, safety awareness, introduction of compensatory strategies/AE prn, and implementation of an HEP to improve participation and safety during ADLs and IADLs.   PERFORMANCE DEFICITS: in functional skills including ADLs, IADLs, coordination, dexterity, sensation, ROM, strength, pain, Fine motor control, Gross motor control, balance, body mechanics, endurance, decreased knowledge of precautions, decreased knowledge of use of DME, and UE functional use and psychosocial skills including coping strategies and environmental adaptation.   IMPAIRMENTS: are limiting patient from ADLs, IADLs, rest and sleep, and work.   CO-MORBIDITIES: may have co-morbidities  that affects occupational performance. Patient will benefit from skilled OT to address above impairments and improve overall function.  MODIFICATION OR ASSISTANCE TO COMPLETE EVALUATION: Min-Moderate modification of tasks or assist with assess necessary to complete an evaluation.  OT OCCUPATIONAL PROFILE AND HISTORY: Detailed assessment: Review of records and additional review of physical, cognitive, psychosocial history  related to current functional performance.  CLINICAL DECISION MAKING: LOW - limited treatment options, no task modification necessary  REHAB POTENTIAL: Good  EVALUATION COMPLEXITY: Moderate    PLAN:  OT FREQUENCY: 2x/week  OT DURATION: 8 weeks  PLANNED INTERVENTIONS: self care/ADL training, therapeutic exercise, therapeutic activity, neuromuscular re-education, manual therapy, passive range of motion, balance training, functional mobility training, electrical stimulation, ultrasound, moist heat, cryotherapy, patient/family education, psychosocial skills training, energy conservation, coping strategies training, and DME and/or AE instructions  RECOMMENDED OTHER SERVICES: NA  CONSULTED AND AGREED WITH PLAN OF CARE: Patient  PLAN FOR NEXT SESSION: Initiate [facility] tasks and shoulder ROM   Veronda Gabor, [place], OTR/L 12/06/2022, 10:52 AM

## 2022-12-06 NOTE — Patient Instructions (Addendum)
[place] [phone] Call Today  When we receive your results we will contact you.  Gabapentin 300mg  Cymbalta 20mg  Lets see what the test show Keep working with PT/OT See you again in 5-6 weeks

## 2022-12-06 NOTE — Assessment & Plan Note (Signed)
Patient does have significant left-sided weakness that is likely from the cord contusion and the postsurgical changes.  We discussed with patient as well as significant other that this can be consistent with the injury.  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  Patient states that initially also had significant weakness noted of the left lower extremity.  Some likely that seem to come back much quicker he states.  Patient has been working with occupational therapy and physical therapy and is making progress.  We did discuss with patient having this on the left side and with the C7 distribution as well and what is appears to be some mild pronator drift that we should consider the possibility of an MRI of the brain just to make sure that there is no other ischemic episode that could have occurred.  In addition of this because of it being more of the C7 and T1 also seem to be part of the weakness I would think that and potentially a thoracic MRI would also be beneficial to make sure that there is no resolving hematoma that could be contributing.  We discussed that both of these would not make any significant changes of the management other than preventative care if there was any type of CVA.  Patient and caregiver though do want to further evaluate to make sure that we are doing everything appropriately.  Will get the imaging to further evaluate encourage patient to continue to go to occupational therapy as well as physical therapy.  Patient would like to get off the oxycodone and will increase gabapentin to 300 mg at night as well as Cymbalta 20 mg during the day.  Warned of potential side effects.  Follow-up with me again after imaging to discuss further.  Total time reviewing patient's records as well as discussing with patient and significant other 51 minutes

## 2022-12-07 ENCOUNTER — Telehealth: Payer: Self-pay | Admitting: *Deleted

## 2022-12-07 NOTE — Telephone Encounter (Signed)
Vmail left requesting refill on Norco that a 7 day supply was given at hospital discharge. Patient was seen by Dr. Naaman Plummer on yesterday.

## 2022-12-10 ENCOUNTER — Other Ambulatory Visit (HOSPITAL_COMMUNITY): Payer: Self-pay

## 2022-12-10 MED ORDER — HYDROCODONE-ACETAMINOPHEN 10-325 MG PO TABS
1.0000 | ORAL_TABLET | Freq: Four times a day (QID) | ORAL | 0 refills | Status: DC | PRN
  Filled 2022-12-10: qty 120, 15d supply, fill #0

## 2022-12-10 NOTE — Telephone Encounter (Signed)
Request refill of Hydrocodone-APAP; patient was given 7 day supply @ d/c. Appt in clinic 12/31/22 Seen in hospital 11/30/22 D/C

## 2022-12-11 ENCOUNTER — Other Ambulatory Visit (HOSPITAL_COMMUNITY): Payer: Self-pay

## 2022-12-11 NOTE — Telephone Encounter (Signed)
No voice ID on phone. Generic message left to inform patient Rx has been sent.

## 2022-12-12 ENCOUNTER — Ambulatory Visit: Payer: Commercial Managed Care - PPO

## 2022-12-12 ENCOUNTER — Other Ambulatory Visit (HOSPITAL_COMMUNITY): Payer: Self-pay

## 2022-12-12 DIAGNOSIS — R278 Other lack of coordination: Secondary | ICD-10-CM | POA: Diagnosis not present

## 2022-12-12 DIAGNOSIS — R2689 Other abnormalities of gait and mobility: Secondary | ICD-10-CM

## 2022-12-12 DIAGNOSIS — K219 Gastro-esophageal reflux disease without esophagitis: Secondary | ICD-10-CM | POA: Diagnosis not present

## 2022-12-12 DIAGNOSIS — R208 Other disturbances of skin sensation: Secondary | ICD-10-CM | POA: Diagnosis not present

## 2022-12-12 DIAGNOSIS — M79642 Pain in left hand: Secondary | ICD-10-CM | POA: Diagnosis not present

## 2022-12-12 DIAGNOSIS — R29818 Other symptoms and signs involving the nervous system: Secondary | ICD-10-CM

## 2022-12-12 DIAGNOSIS — M79602 Pain in left arm: Secondary | ICD-10-CM | POA: Diagnosis not present

## 2022-12-12 DIAGNOSIS — R2681 Unsteadiness on feet: Secondary | ICD-10-CM | POA: Diagnosis not present

## 2022-12-12 DIAGNOSIS — R7309 Other abnormal glucose: Secondary | ICD-10-CM | POA: Diagnosis not present

## 2022-12-12 DIAGNOSIS — D126 Benign neoplasm of colon, unspecified: Secondary | ICD-10-CM | POA: Diagnosis not present

## 2022-12-12 DIAGNOSIS — M25512 Pain in left shoulder: Secondary | ICD-10-CM | POA: Diagnosis not present

## 2022-12-12 DIAGNOSIS — M6281 Muscle weakness (generalized): Secondary | ICD-10-CM | POA: Diagnosis not present

## 2022-12-12 DIAGNOSIS — Z79899 Other long term (current) drug therapy: Secondary | ICD-10-CM | POA: Diagnosis not present

## 2022-12-12 DIAGNOSIS — G959 Disease of spinal cord, unspecified: Secondary | ICD-10-CM | POA: Diagnosis not present

## 2022-12-12 DIAGNOSIS — E782 Mixed hyperlipidemia: Secondary | ICD-10-CM | POA: Diagnosis not present

## 2022-12-12 DIAGNOSIS — I10 Essential (primary) hypertension: Secondary | ICD-10-CM | POA: Diagnosis not present

## 2022-12-12 DIAGNOSIS — Z125 Encounter for screening for malignant neoplasm of prostate: Secondary | ICD-10-CM | POA: Diagnosis not present

## 2022-12-12 NOTE — Therapy (Signed)
OUTPATIENT PHYSICAL THERAPY NEURO TREATMENT   Patient Name: Johnny Robinson MRN: [id number] DOB:May 12, 1966, 57 y.o., male Today's Date: 12/12/2022   PCP:  Idelle Crouch, MD   REFERRING PROVIDER: Bary Leriche, PA-C  END OF SESSION:  PT End of Session - 12/12/22 0925     Visit Number 2    Number of Visits 13    Date for PT Re-Evaluation 01/17/23    Authorization Type [facility] [employer]    Authorization - Visit Number 2    Authorization - Number of Visits 25    PT Start Time 0930    PT Stop Time 1015    PT Time Calculation (min) 45 min    Equipment Utilized During Treatment Gait belt    Activity Tolerance Patient tolerated treatment well    Behavior During Therapy [facility] for tasks assessed/performed             Past Medical History:  Diagnosis Date   Arthritis    Chronic kidney disease 2016   RENAL INFARCT   GERD (gastroesophageal reflux disease)    Hypertension    Reflux    Subdural hematoma ([place]) 1991   Past Surgical History:  Procedure Laterality Date   BACK SURGERY  11/2017   burr hole  1992   to evacuate SDH   COLONOSCOPY WITH PROPOFOL N/A 09/02/2018   Procedure: COLONOSCOPY WITH PROPOFOL;  Surgeon: Lucilla Lame, MD;  Location: [facility] ENDOSCOPY;  Service: Endoscopy;  Laterality: N/A;   EXCISION MASS ABDOMINAL Left 05/28/2018   Procedure: EXCISIONAL BIOPSY MASS ABDOMINAL WALL;  Surgeon: Herbert Pun, MD;  Location: [facility] ORS;  Service: General;  Laterality: Left;   PERIPHERAL VASCULAR CATHETERIZATION Right 05/18/2015   Procedure: Renal Angiography;  Surgeon: Algernon Huxley, MD;  Location: [facility];  Service: Cardiovascular;  Laterality: Right;   PERIPHERAL VASCULAR CATHETERIZATION Bilateral 05/18/2015   Procedure: Renal Intervention;  Surgeon: Algernon Huxley, MD;  Location: [place] [facility];  Service: Cardiovascular;  Laterality: Bilateral;   TOTAL HIP ARTHROPLASTY Left 03/07/2022   Procedure: TOTAL HIP ARTHROPLASTY ANTERIOR APPROACH;  Surgeon:  Gaynelle Arabian, MD;  Location: [facility];  Service: Orthopedics;  Laterality: Left;   Patient Active Problem List   Diagnosis Date Noted   Left shoulder pain 11/30/2022   Urinary hesitancy 11/30/2022   Constipation 11/30/2022   Biceps tendinitis of left shoulder 11/30/2022   Multifocal pneumonia 11/30/2022   Chronic pain syndrome 11/21/2022   HNP (herniated nucleus pulposus) with myelopathy, cervical 11/15/2022   Cervical myelopathy ([place]) 11/13/2022   OA (osteoarthritis) of hip 03/07/2022   Primary osteoarthritis of left hip 03/07/2022   GERD (gastroesophageal reflux disease) 11/27/2021   Encounter for screening colonoscopy    Benign neoplasm of rectosigmoid junction    Hyperlipidemia 12/18/2017   Essential hypertension 12/17/2016   Chest pain 05/30/2015   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 Having instances of dizziness/lightheadedness.  Pt accompanied by: self  PERTINENT HISTORY: CKD, GERD, HTN, subdural hematoma 1991, back surgery 2019, L THA 2023  PAIN:  Are you having pain? Yes: NPRS scale: 4-5/10 Pain location: L neck down the elbow Pain description: ache Aggravating factors: raising arm Relieving factors: meds  PRECAUTIONS: Cervical and Fall  WEIGHT BEARING RESTRICTIONS: No  FALLS: Has patient fallen in last 6 months? No  LIVING  ENVIRONMENT: Lives with: lives with their spouse Lives in: House/apartment Stairs:  4 steps to enter without handrail; 3 story home  Has following equipment at home: Lobbyist, Environmental consultant - 2 wheeled, Electronics engineer, and Grab bars  PLOF: Independent, Vocation/Vocational requirements: standing, walking, some desk work, and Leisure: tennis (has not done this since [place] 2023)  PATIENT GOALS: return to tennis and fishing   OBJECTIVE:   TODAY'S TREATMENT: 12/12/22 Activity Comments  TUG test 10 sec w/out cane  Berg Balance Test 55/56  Standing on foam -feet together/apart EO/EC x 30 sec -semi-tandem EO/EC -foot on Bosu  Alt Bosu taps 3x10, drop sets -10# LLE -5# LLE  Standing hamstring curls 3x10, drop sets -10# LLE -5# LLE  LAQ 3x10, drop set -10# -5#  Gait training Use of SPC to promote smooth swing LLE and heel strike initial contact     DIAGNOSTIC FINDINGS: 11/13/22 cervical MRI:  Postoperative changes from prior ACDF at C3 through C6 without significant residual spinal stenosis.Patchy signal abnormality involving the central and left cord at the level of C5-6, concerning for contusion/injury.  11/23/22 cervical xray: Prior ACDF from C3-C6. Intact hardware without evidence of loosening. Persistent prevertebral soft tissue swelling.Mild degenerative disc disease at C6-C7. Mild to moderate multilevel facet arthropathy.  COGNITION: Overall cognitive status: Within functional limits for tasks assessed   SENSATION: WFL  COORDINATION: Alternating pronation/supination: limited ROM on L and slow Alternating toe tap: intact B Finger to nose: unable on L d/t weakness, R UE intact Heel to shin: limited ROM on L d/t weakness    MUSCLE TONE: intact B LEs   POSTURE: rounded shoulders and flexed trunk   LOWER EXTREMITY ROM:     Active  Right Eval Left Eval  Hip flexion    Hip extension    Hip abduction    Hip adduction    Hip internal rotation    Hip external rotation     Knee flexion    Knee extension    Ankle dorsiflexion 20 3  Ankle plantarflexion    Ankle inversion    Ankle eversion     (Blank rows = not tested)  LOWER EXTREMITY MMT:    MMT (in sitting) Right Eval Left Eval  Hip flexion 4+ 4-  Hip extension    Hip abduction 4 4-  Hip adduction 4 4-  Hip internal rotation    Hip external rotation    Knee flexion 5 4-  Knee extension 5 4  Ankle dorsiflexion 4+ 4  Ankle plantarflexion 4 4-  Ankle inversion    Ankle eversion    (Blank rows = not tested)   GAIT: Gait pattern: Decreased R step length, slightly decreased L knee flexion during swing through, good foot clearance and step through pattern Assistive device utilized: None Level of assistance: Modified independence   FUNCTIONAL TESTS:        PATIENT EDUCATION: Education details: prognosis, POC, HEP Person educated: Patient Education method: Explanation, Demonstration, Corporate treasurer  cues, Verbal cues, and Handouts Education comprehension: verbalized understanding  HOME EXERCISE PROGRAM: Access Code: [email or web address] URL: [email or web address] Date: 12/06/2022 Prepared by: [facility]  Exercises - Seated Hamstring Curl with Anchored Resistance  - 1 x daily - 5 x weekly - 2 sets - 10 reps - Marching with Resistance  - 1 x daily - 5 x weekly - 2 sets - 10 reps - Squat with Chair Touch  - 1 x daily - 5 x weekly - 2 sets - 10 reps - Heel Raises with Counter Support  - 1 x daily - 5 x weekly - 2 sets - 10 reps   GOALS: Goals reviewed with patient? Yes  SHORT TERM GOALS: Target date: 12/27/2022  Patient to be independent with initial HEP. Baseline: HEP initiated Goal status: INITIAL    LONG TERM GOALS: Target date: 01/17/2023  Patient to be independent with advanced HEP. Baseline: Not yet initiated  Goal status: INITIAL  Patient to demonstrate B LE strength >/=4+/5.  Baseline: See above Goal status: INITIAL  Patient to  demonstrate alternating reciprocal pattern when ascending and descending stairs with good stability and no handrail.  Baseline: requires handrail Goal status: INITIAL  Patient to score at least 26/30 on FGA in order to decrease risk of falls.  Baseline: 23/30 Goal status: INITIAL  Patient to demonstrate symmetrical step length and knee flexion during swing phase with ambulation without AD with good stability.  Baseline: unable Goal status: INITIAL  Patient to return to exercise regimen with modifications as needed.  Baseline: not initiated Goal status: INITIAL    ASSESSMENT:  CLINICAL IMPRESSION: Initiated with fall risk assessments per patient expressed desire to eliminate use of quad cane and demo low risk per TUG and Berg Balance Test. Continued tx with interventions to improve balance/proprioception on compliant surfaces with left side deficits. Muscular endurance training via drop sets to improve LLE facilitation. Advised to use something such as single point cane, trekking pole, or like to improve reciprocal gait pattern. Demo left hip flexor weakness and a circumduction type gait    OBJECTIVE IMPAIRMENTS: Abnormal gait, decreased activity tolerance, decreased balance, decreased endurance, difficulty walking, decreased ROM, decreased strength, improper body mechanics, postural dysfunction, and pain.   ACTIVITY LIMITATIONS: carrying, lifting, standing, squatting, stairs, transfers, bathing, toileting, dressing, reach over head, hygiene/grooming, locomotion level, and caring for others  PARTICIPATION LIMITATIONS: meal prep, cleaning, laundry, driving, shopping, community activity, occupation, yard work, and church  PERSONAL FACTORS: Age, Fitness, Past/current experiences, Time since onset of injury/illness/exacerbation, and 3+ comorbidities: CKD, GERD, HTN, subdural hematoma 1991, back surgery 2019, L THA 2023, C3-6 ACDF 11/12/22  are also affecting patient's functional outcome.    REHAB POTENTIAL: Good  CLINICAL DECISION MAKING: Evolving/moderate complexity  EVALUATION COMPLEXITY: Moderate  PLAN:  PT FREQUENCY: 1-2x/week  PT DURATION: 6 weeks  PLANNED INTERVENTIONS: Therapeutic exercises, Therapeutic activity, Neuromuscular re-education, Balance training, Gait training, Patient/Family education, Self Care, Joint mobilization, Stair training, Vestibular training, Canalith repositioning, Aquatic Therapy, Dry Needling, Electrical stimulation, Cryotherapy, Moist heat, Taping, Manual therapy, and Re-evaluation  PLAN FOR NEXT SESSION: review HEP; progress L LE strengthening, high level balance, stairs   10:10 AM, 12/12/22 M. Sherlyn Lees, PT, DPT Physical Therapist- [place] Office Number: [phone]

## 2022-12-15 ENCOUNTER — Other Ambulatory Visit (HOSPITAL_COMMUNITY): Payer: Self-pay

## 2022-12-17 ENCOUNTER — Other Ambulatory Visit (HOSPITAL_COMMUNITY): Payer: Self-pay

## 2022-12-18 ENCOUNTER — Other Ambulatory Visit (HOSPITAL_COMMUNITY): Payer: Self-pay

## 2022-12-19 ENCOUNTER — Other Ambulatory Visit (HOSPITAL_COMMUNITY): Payer: Self-pay

## 2022-12-19 ENCOUNTER — Telehealth: Payer: Self-pay

## 2022-12-19 ENCOUNTER — Other Ambulatory Visit: Payer: Self-pay | Admitting: Physical Medicine and Rehabilitation

## 2022-12-19 NOTE — Therapy (Signed)
OUTPATIENT PHYSICAL THERAPY NEURO TREATMENT   Patient Name: Johnny Robinson MRN: [id number] DOB:1965/12/10, 57 y.o., male Today's Date: 12/20/2022   PCP:  Marguarite Arbour, MD   REFERRING PROVIDER: Jacquelynn Cree, PA-C  END OF SESSION:  PT End of Session - 12/20/22 0926     Visit Number 3    Number of Visits 13    Date for PT Re-Evaluation 01/17/23    Authorization Type [facility] [employer]    Authorization - Visit Number 3    Authorization - Number of Visits 25    PT Start Time [phone]    PT Stop Time 0925    PT Time Calculation (min) 38 min    Equipment Utilized During Treatment Gait belt    Activity Tolerance Patient tolerated treatment well    Behavior During Therapy [facility] for tasks assessed/performed              Past Medical History:  Diagnosis Date   Arthritis    Chronic kidney disease 2016   RENAL INFARCT   GERD (gastroesophageal reflux disease)    Hypertension    Reflux    Subdural hematoma 1991   Past Surgical History:  Procedure Laterality Date   BACK SURGERY  11/2017   burr hole  1992   to evacuate SDH   COLONOSCOPY WITH PROPOFOL N/A 09/02/2018   Procedure: COLONOSCOPY WITH PROPOFOL;  Surgeon: Midge Minium, MD;  Location: [facility] ENDOSCOPY;  Service: Endoscopy;  Laterality: N/A;   EXCISION MASS ABDOMINAL Left 05/28/2018   Procedure: EXCISIONAL BIOPSY MASS ABDOMINAL WALL;  Surgeon: Carolan Shiver, MD;  Location: [facility] ORS;  Service: General;  Laterality: Left;   PERIPHERAL VASCULAR CATHETERIZATION Right 05/18/2015   Procedure: Renal Angiography;  Surgeon: Annice Needy, MD;  Location: [facility] INV[facility];  Service: Cardiovascular;  Laterality: Right;   PERIPHERAL VASCULAR CATHETERIZATION Bilateral 05/18/2015   Procedure: Renal Intervention;  Surgeon: Annice Needy, MD;  Location: [facility] INV[facility];  Service: Cardiovascular;  Laterality: Bilateral;   TOTAL HIP ARTHROPLASTY Left 03/07/2022   Procedure: TOTAL HIP ARTHROPLASTY ANTERIOR APPROACH;  Surgeon: Ollen Gross, MD;  Location: [facility];  Service: Orthopedics;  Laterality: Left;   Patient Active Problem List   Diagnosis Date Noted   Left shoulder pain 11/30/2022   Urinary hesitancy 11/30/2022   Constipation 11/30/2022   Biceps tendinitis of left shoulder 11/30/2022   Multifocal pneumonia 11/30/2022   Chronic pain syndrome 11/21/2022   HNP (herniated nucleus pulposus) with myelopathy, cervical 11/15/2022   Cervical myelopathy 11/13/2022   OA (osteoarthritis) of hip 03/07/2022   Primary osteoarthritis of left hip 03/07/2022   GERD (gastroesophageal reflux disease) 11/27/2021   Encounter for screening colonoscopy    Benign neoplasm of rectosigmoid junction    Hyperlipidemia 12/18/2017   Essential hypertension 12/17/2016   Chest pain 05/30/2015   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 Was a little sore. Feeling like the L LE weakness is his biggest limitation. Has been trying to navigate stairs step over step- has had to catch himself a few times.   Pt accompanied by: self  PERTINENT HISTORY: CKD, GERD, HTN, subdural hematoma 1991, back surgery 2019, L THA 2023  PAIN:  Are you having pain? Yes: NPRS scale: 4-5/10 Pain location: L arm Pain description: ache Aggravating factors: raising arm Relieving factors: meds  PRECAUTIONS: Cervical and Fall  WEIGHT BEARING RESTRICTIONS: No  FALLS:  Has patient fallen in last 6 months? No  LIVING ENVIRONMENT: Lives with: lives with their spouse Lives in: House/apartment Stairs:  4 steps to enter without handrail; 3 story home  Has following equipment at home: Counselling psychologist, Environmental consultant - 2 wheeled, Tour manager, and Grab bars  PLOF: Independent, Vocation/Vocational requirements: standing, walking, some desk work, and Leisure: tennis (has not done this since THA 2023)  PATIENT GOALS: return to tennis and fishing   OBJECTIVE:      TODAY'S TREATMENT: 12/20/22 Activity Comments  bridge 10x Lifting as high as possible without LB strain   bridge straight leg on pball 10x Good form  Single leg bridge 10x each LE Some difficulty   L Single leg squat on 64cm mat 2x8 Cueing to keep light pressure through bottom   toe tap on 1st, 2nd step, down on foam 10x each Good stability; pt noted more wobble with L LE stabilizing   L step downs with heel touch on 4" step CGA-min A required occasionally d/t some instability; better when holding on  L single leg heel raise 10x Decreasing ROM with increased reps   Gastroc stretch 30", toes on wall stretch 30" Cueing for positioning   HS curl standing with red TB 2x10 Limited ROM; cues to move throughout full ROM  Stairs Navigation  Several times step over step pattern with 1 handrail; cueing to descend slowly    HOME EXERCISE PROGRAM Last updated: 4/424 Access Code: [email or web address] URL: [email or web address] Date: 12/20/2022 Prepared by: [facility] - Outpatient  Rehab - [facility]  Exercises - Seated Hamstring Curl with Anchored Resistance  - 1 x daily - 5 x weekly - 2 sets - 10 reps - Marching with Resistance  - 1 x daily - 5 x weekly - 2 sets - 10 reps - Single Leg Bridge  - 1 x daily - 5 x weekly - 2 sets - 10 reps - Single Leg Squat with Chair Touch  - 1 x daily - 5 x weekly - 2 sets - 8 reps - Forward Step Down  - 1 x daily - 5 x weekly - 2 sets - 10 reps - Single Leg Heel  Raise with Counter Support  - 1 x daily - 5 x weekly - 2 sets - 5-10 reps - Standing Gastroc Stretch on Foam 1/2 Roll  - 1 x daily - 5 x weekly - 2 sets - 30 sec hold   PATIENT EDUCATION: Education details: HEP update Person educated: Patient Education method: Explanation, Demonstration, Tactile cues, Verbal cues, and Handouts Education comprehension: verbalized understanding and returned demonstration     DIAGNOSTIC FINDINGS: 11/13/22 cervical MRI:  Postoperative changes from prior ACDF at C3 through C6 without significant residual spinal stenosis.Patchy signal abnormality involving the central and left cord at the level of C5-6, concerning for contusion/injury.  11/23/22 cervical xray: Prior ACDF from C3-C6. Intact hardware without evidence  of loosening. Persistent prevertebral soft tissue swelling.Mild degenerative disc disease at C6-C7. Mild to moderate multilevel facet arthropathy.  COGNITION: Overall cognitive status: Within functional limits for tasks assessed   SENSATION: WFL  COORDINATION: Alternating pronation/supination: limited ROM on L and slow Alternating toe tap: intact B Finger to nose: unable on L d/t weakness, R UE intact Heel to shin: limited ROM on L d/t weakness    MUSCLE TONE: intact B LEs   POSTURE: rounded shoulders and flexed trunk   LOWER EXTREMITY ROM:     Active  Right Eval Left Eval  Hip flexion    Hip extension    Hip abduction    Hip adduction    Hip internal rotation    Hip external rotation    Knee flexion    Knee extension    Ankle dorsiflexion 20 3  Ankle plantarflexion    Ankle inversion    Ankle eversion     (Blank rows = not tested)  LOWER EXTREMITY MMT:    MMT (in sitting) Right Eval Left Eval  Hip flexion 4+ 4-  Hip extension    Hip abduction 4 4-  Hip adduction 4 4-  Hip internal rotation    Hip external rotation    Knee flexion 5 4-  Knee extension 5 4  Ankle dorsiflexion 4+ 4  Ankle plantarflexion 4 4-   Ankle inversion    Ankle eversion    (Blank rows = not tested)   GAIT: Gait pattern: Decreased R step length, slightly decreased L knee flexion during swing through, good foot clearance and step through pattern Assistive device utilized: None Level of assistance: Modified independence   FUNCTIONAL TESTS:        PATIENT EDUCATION: Education details: prognosis, POC, HEP Person educated: Patient Education method: Explanation, Demonstration, Tactile cues, Verbal cues, and Handouts Education comprehension: verbalized understanding  HOME EXERCISE PROGRAM: Access Code: [email or web address] URL: [email or web address] Date: 12/06/2022 Prepared by: [facility] - Outpatient  Rehab - [facility]  Exercises - Seated Hamstring Curl with Anchored Resistance  - 1 x daily - 5 x weekly - 2 sets - 10 reps - Marching with Resistance  - 1 x daily - 5 x weekly - 2 sets - 10 reps - Squat with Chair Touch  - 1 x daily - 5 x weekly - 2 sets - 10 reps - Heel Raises with Counter Support  - 1 x daily - 5 x weekly - 2 sets - 10 reps   GOALS: Goals reviewed with patient? Yes  SHORT TERM GOALS: Target date: 12/27/2022  Patient to be independent with initial HEP. Baseline: HEP initiated Goal status: IN PROGRESS    LONG TERM GOALS: Target date: 01/17/2023  Patient to be independent with advanced HEP. Baseline: Not yet initiated  Goal status: IN PROGRESS  Patient to demonstrate B LE strength >/=4+/5.  Baseline: See above Goal status: IN PROGRESS  Patient to demonstrate alternating reciprocal pattern when ascending and descending stairs with good stability and no handrail.  Baseline: requires handrail Goal status: IN PROGRESS  Patient to score at least 26/30 on FGA in order to decrease risk of falls.  Baseline: 23/30 Goal status: IN PROGRESS  Patient to demonstrate symmetrical step length and knee flexion during swing phase with ambulation without AD with good stability.   Baseline: unable Goal status: IN PROGRESS  Patient to return to exercise regimen with modifications as needed.  Baseline: not initiated Goal status: IN PROGRESS    ASSESSMENT:  CLINICAL IMPRESSION:  Patient arrived to session with report of getting a walking stick and progressing stairs at home. Able to progress exercises significantly today to work on biasing weaker L LE. Patient demonstrates limited quad and HS strength which was evident with exercises, but tolerated activities well. Stair climbing now with step through pattern with some limited muscle control. Patient reported understanding of all edu provided and without complaints at end of session.      OBJECTIVE IMPAIRMENTS: Abnormal gait, decreased activity tolerance, decreased balance, decreased endurance, difficulty walking, decreased ROM, decreased strength, improper body mechanics, postural dysfunction, and pain.   ACTIVITY LIMITATIONS: carrying, lifting, standing, squatting, stairs, transfers, bathing, toileting, dressing, reach over head, hygiene/grooming, locomotion level, and caring for others  PARTICIPATION LIMITATIONS: meal prep, cleaning, laundry, driving, shopping, community activity, occupation, yard work, and church  PERSONAL FACTORS: Age, Fitness, Past/current experiences, Time since onset of injury/illness/exacerbation, and 3+ comorbidities: CKD, GERD, HTN, subdural hematoma 1991, back surgery 2019, L THA 2023, C3-6 ACDF 11/12/22  are also affecting patient's functional outcome.   REHAB POTENTIAL: Good  CLINICAL DECISION MAKING: Evolving/moderate complexity  EVALUATION COMPLEXITY: Moderate  PLAN:  PT FREQUENCY: 1-2x/week  PT DURATION: 6 weeks  PLANNED INTERVENTIONS: Therapeutic exercises, Therapeutic activity, Neuromuscular re-education, Balance training, Gait training, Patient/Family education, Self Care, Joint mobilization, Stair training, Vestibular training, Canalith repositioning, Aquatic Therapy, Dry  Needling, Electrical stimulation, Cryotherapy, Moist heat, Taping, Manual therapy, and Re-evaluation  PLAN FOR NEXT SESSION: review HEP; progress L LE strengthening, high level balance, stairs   Anette Guarneri, PT, DPT 12/20/22 9:28 AM  [facility] [address] [place], [place] [address] Phone # [phone] Fax # [phone]

## 2022-12-19 NOTE — Telephone Encounter (Signed)
Patient wife shannon left a message at 12:22pm that her husband was referred for a colonoscopy and she is wanting to get that schedule for him. We received a referral for acid reflex not colonoscopy. [name] is on patient DPR. Returned shannon call and left a message for call back

## 2022-12-20 ENCOUNTER — Ambulatory Visit: Payer: Commercial Managed Care - PPO | Attending: Physical Medicine and Rehabilitation | Admitting: Occupational Therapy

## 2022-12-20 ENCOUNTER — Ambulatory Visit: Payer: Commercial Managed Care - PPO | Admitting: Physical Therapy

## 2022-12-20 ENCOUNTER — Encounter: Payer: Self-pay | Admitting: Physical Therapy

## 2022-12-20 DIAGNOSIS — R2681 Unsteadiness on feet: Secondary | ICD-10-CM

## 2022-12-20 DIAGNOSIS — R208 Other disturbances of skin sensation: Secondary | ICD-10-CM | POA: Insufficient documentation

## 2022-12-20 DIAGNOSIS — R278 Other lack of coordination: Secondary | ICD-10-CM | POA: Diagnosis not present

## 2022-12-20 DIAGNOSIS — M6281 Muscle weakness (generalized): Secondary | ICD-10-CM | POA: Insufficient documentation

## 2022-12-20 DIAGNOSIS — R29818 Other symptoms and signs involving the nervous system: Secondary | ICD-10-CM | POA: Diagnosis not present

## 2022-12-20 DIAGNOSIS — G959 Disease of spinal cord, unspecified: Secondary | ICD-10-CM | POA: Insufficient documentation

## 2022-12-20 DIAGNOSIS — R2689 Other abnormalities of gait and mobility: Secondary | ICD-10-CM | POA: Insufficient documentation

## 2022-12-20 DIAGNOSIS — M79602 Pain in left arm: Secondary | ICD-10-CM | POA: Insufficient documentation

## 2022-12-20 DIAGNOSIS — M79642 Pain in left hand: Secondary | ICD-10-CM | POA: Diagnosis not present

## 2022-12-20 NOTE — Therapy (Signed)
[facility] Treatment Session  Patient Name: Johnny Robinson MRN: [id number] DOB:[birth year], 57 y.o., male Today's Date: 12/20/2022  PCP: Idelle Crouch, MD REFERRING PROVIDER: Bary Leriche, PA-C  END OF SESSION:  OT End of Session - 12/20/22 0811     Visit Number 2    Number of Visits 17    Date for OT Re-Evaluation 02/01/23    Authorization Type [employer] employee - [employer]    OT Start Time [phone]    OT Stop Time [phone]    OT Time Calculation (min) 42 min              Past Medical History:  Diagnosis Date   Arthritis    Chronic kidney disease 2016   RENAL INFARCT   GERD (gastroesophageal reflux disease)    Hypertension    Reflux    Subdural hematoma ([place]) 1991   Past Surgical History:  Procedure Laterality Date   BACK SURGERY  11/2017   burr hole  1992   to evacuate SDH   COLONOSCOPY WITH PROPOFOL N/A 09/02/2018   Procedure: COLONOSCOPY WITH PROPOFOL;  Surgeon: Lucilla Lame, MD;  Location: [facility] ENDOSCOPY;  Service: Endoscopy;  Laterality: N/A;   EXCISION MASS ABDOMINAL Left 05/28/2018   Procedure: EXCISIONAL BIOPSY MASS ABDOMINAL WALL;  Surgeon: Herbert Pun, MD;  Location: [facility] ORS;  Service: General;  Laterality: Left;   PERIPHERAL VASCULAR CATHETERIZATION Right 05/18/2015   Procedure: Renal Angiography;  Surgeon: Algernon Huxley, MD;  Location: [place] [facility];  Service: Cardiovascular;  Laterality: Right;   PERIPHERAL VASCULAR CATHETERIZATION Bilateral 05/18/2015   Procedure: Renal Intervention;  Surgeon: Algernon Huxley, MD;  Location: [place] [facility];  Service: Cardiovascular;  Laterality: Bilateral;   TOTAL HIP ARTHROPLASTY Left 03/07/2022   Procedure: TOTAL HIP ARTHROPLASTY ANTERIOR APPROACH;  Surgeon: Gaynelle Arabian, MD;  Location: [facility] ORS;  Service: Orthopedics;  Laterality: Left;   Patient Active Problem List   Diagnosis Date Noted   Left shoulder pain 11/30/2022   Urinary hesitancy 11/30/2022   Constipation  11/30/2022   Biceps tendinitis of left shoulder 11/30/2022   Multifocal pneumonia 11/30/2022   Chronic pain syndrome 11/21/2022   HNP (herniated nucleus pulposus) with myelopathy, cervical 11/15/2022   Cervical myelopathy 11/13/2022   OA (osteoarthritis) of hip 03/07/2022   Primary osteoarthritis of left hip 03/07/2022   GERD (gastroesophageal reflux disease) 11/27/2021   Encounter for screening colonoscopy    Benign neoplasm of rectosigmoid junction    Hyperlipidemia 12/18/2017   Essential hypertension 12/17/2016   Chest pain 05/30/2015   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 Dr. Annette Stable expressed concerns of cord contusion due to worsening of  myelopathy and MRI spine done which showed post op changes form ACDF C3-C6 and patchy signal abnormality involving the central and left cord at C5/6 concerning for contusion/injury.  PRECAUTIONS: Cervical and Other: lifting precautions not > 5#  WEIGHT BEARING RESTRICTIONS: No  PAIN:  Are you having pain? Yes: NPRS scale: 4-5/10 Pain location: from neck to L elbow Pain description:  constant Aggravating factors: not staying on schedule pain pills Relieving factors: taking pain pills  FALLS: Has patient fallen in last 6 months? No  LIVING ENVIRONMENT: Lives with: lives with their spouse and 26 yo Lives in: House/apartment Stairs: Yes: Internal: full flight steps; on right going up and External: 4 steps; none Has following equipment at home: Quad cane small base, Walker - 2 wheeled, shower chair, bed side commode, and hand held shower head  PLOF: Independent, Independent with basic ADLs, and Vocation/Vocational requirements: practices law  PATIENT GOALS: pt would like to get arm progressing like leg   OBJECTIVE:   HAND DOMINANCE: Right  ADLs: Overall ADLs: requires increased time and thought to complete Transfers/ambulation related to ADLs: Mod I with SBQC Eating: requires assistance to cut food Grooming: Mod I UB Dressing: requiring assistance with buttons LB Dressing: requiring assistance with socks/tying shoes Toileting: Mod I Bathing: Mod I Tub Shower transfers: Mod I, has built in shower seat Equipment: Walk in shower  IADLs: Light housekeeping: has attempted vacuuming without issue Meal Prep: has attempted cooking, spouse or children assisting with retrieving items if >5# Medication management: spouse is filling pillbox and is reminding him to take  MOBILITY STATUS: Needs Assist: Mod I - Supervision with SBQC  POSTURE COMMENTS:  rounded shoulders and forward head  FUNCTIONAL OUTCOME MEASURES: Upper Extremity Functional Scale (UEFS): 33/80 =  41.25% function  UPPER EXTREMITY ROM:    Active ROM Right eval Left eval  Shoulder flexion  88 (requiring abduction to achieve)  Shoulder abduction  85  Shoulder adduction    Shoulder extension    Shoulder internal rotation  90%  Shoulder external rotation  30%  Elbow flexion  100  Elbow extension  WNL  Wrist flexion  WNL  Wrist extension  To neutral  Wrist ulnar deviation  trace  Wrist radial  deviation  trace  Wrist pronation    Wrist supination  To neutral  (Blank rows = not tested)  UPPER EXTREMITY MMT:     MMT Right eval Left eval  Shoulder flexion  2-/5  Shoulder abduction    Shoulder adduction    Shoulder extension    Shoulder internal rotation    Shoulder external rotation    Middle trapezius    Lower trapezius    Elbow flexion  4-/5  Elbow extension  4-/5  Wrist flexion  3-/5  Wrist extension  3-/5  Wrist ulnar deviation    Wrist radial deviation    Wrist pronation    Wrist supination    (Blank rows = not tested)  HAND FUNCTION: Loose gross grasp, unable to assess strength on dynamometer  COORDINATION: Finger Nose Finger test: able to reach chin to finger with LUE, decreased shoulder ROM limiting movement 9 Hole Peg test: Right: 23.03 sec; Left: able to place 5 pegs in 2 min time limit (pt needing to use side of container/multiple pegs to leverage to pick up one peg, also with difficulty with manipulation/translation in finger tips to orient to place in peg hole) Box and Blocks:  Right 44 blocks, Left 32 blocks  SENSATION: Reports duller sensation  on L compared to R  COGNITION: Overall cognitive status: Within functional limits for tasks assessed  VISION: Subjective report: no changes Baseline vision: Wears glasses for reading only   TODAY'S TREATMENT:                                                                                                                              12/20/22 Shoulder ROM: Engaged in snow angel x10 with focus on increased L shoulder ROM - pt demonstrating improved ROM with repetition.  Engaged in open book stretch with focus on motor control of LUE with horizontal abduction/adduction.  Pt unable to achieve full midline but focusing on motor control with movement.  Wall slides with shoulder flexion scaption in standing with focus on improved ROM and tolerance at increased ROM.  OT modified PNF pattern diagonal to allow for  increased mobility within accessible ROM in upright position. Weight: Pt asking questions about use of light weight dumbbells for forearm strengthening.  Engaged in supination/pronation, wrist flexion/extension, and elbow flexion with 2# dumbbell.  Pt requiring support/stabilization at table top for increased isolation as pt with ability to complete supination/pronation and elbow flexion only in stabilized position as pt still with decreased shoulder strength to maintain position unsupported.  Coordination: picking up, stacking, and rotating checker pieces in hand. Pt demonstrating initial mild difficulty with rotating of checker pieces in finger tips, however with repetition demonstrating improvements.  Pt able to pick up and stack with minimal difficulty.  PATIENT EDUCATION: Education details: ongoing condition specific education and UE ROM and strengthening exercises Person educated: Patient Education method: Explanation, Demonstration, Verbal cues, and Handouts Education comprehension: verbalized understanding and needs further education  HOME EXERCISE PROGRAM: Access Code: [email or web address] URL: [email or web address] Date: 12/20/2022 Prepared by: [facility]  Exercises - [facility]  - 1-2 x daily - 2 sets - 10 reps - Open Book Chest Rotation Stretch on Foam 1/2 Roll  - 1-2 x daily - 2 sets - 10 reps - Shoulder PNF D2 Flexion  - 1-2 x daily - 2 sets - 10 reps - Shoulder Flexion Wall Slide with Towel  - 1-2 x daily - 2 sets - 10 reps - Forearm Supination with Dumbbell  - 1 x daily - 2 sets - 10 reps - Seated Wrist Flexion with Dumbbell  - 1 x daily - 2 sets - 1 reps - Seated Single Arm Bicep Curls Supinated with Dumbbell  - 1 x daily - 2 sets - 10 reps   GOALS: Goals reviewed with patient? Yes  SHORT TERM GOALS: Target date: 01/04/23  Pt will be independent with coordination and ROM HEP. Baseline: Goal status: IN PROGRESS  2.  Pt will  demonstrate increased coordination to be able to place all 9 pegs into peg board during 9 hole peg test in 2 min time limit. Baseline: able to place 5 in 2 min time limit Goal status: IN  PROGRESS  3.  Pt will demonstrate increased L shoulder ROM to allow for ability to retreive light weight object from shoulder height cabinet. Baseline: 88* shoulder flexion with abduction Goal status: IN PROGRESS  4.  Pt will demonstrate improved pinch and coordination as needed to complete clothing fasteners (buttons, tying shoes, etc). Baseline:  Goal status: IN PROGRESS   LONG TERM GOALS: Target date: 02/01/23  Pt will be independent with advanced HEP. Baseline:  Goal status: IN PROGRESS  2.  Pt will demonstrate improved UE functional use for ADLs as evidenced by increasing box/ blocks score by 6 blocks with LUE Baseline: Right 44 blocks, Left 32 blocks Goal status: IN PROGRESS  3.  Pt will demonstrate improved fine motor coordination for ADLs as evidenced by being able to complete 9 hole peg test (place and remove) in 2 min time limit Baseline: able to place 5 in 2 min time limit Goal status: IN PROGRESS  4.  Pt will demonstrate increased L shoulder ROM and strength to tolerate holding phone to ear for 5 mins phone conversation. Baseline:  Goal status: IN PROGRESS  5.  Pt will report improved LUE functional use as evidenced by increased score on UEFS to > 60%. Baseline: 41.25% Goal status: IN PROGRESS    ASSESSMENT:  CLINICAL IMPRESSION: Pt asking good questions about current progress and improvements with supination/pronation.  OT educated on completion of exercises in supine and then supported positions to allow for improved ROM of shoulder and stability as needed for when focus on more distal movements.  Pt demonstrating improved shoulder ROM with snow angels and wall slides this session.  Pt continues to demonstrate decreased supination and elbow flexion when unsupported, requiring  stabilization due to impaired strength at shoulder.  PERFORMANCE DEFICITS: in functional skills including ADLs, IADLs, coordination, dexterity, sensation, ROM, strength, pain, Fine motor control, Gross motor control, balance, body mechanics, endurance, decreased knowledge of precautions, decreased knowledge of use of DME, and UE functional use and psychosocial skills including coping strategies and environmental adaptation.   IMPAIRMENTS: are limiting patient from ADLs, IADLs, rest and sleep, and work.   CO-MORBIDITIES: may have co-morbidities  that affects occupational performance. Patient will benefit from skilled OT to address above impairments and improve overall function.  MODIFICATION OR ASSISTANCE TO COMPLETE EVALUATION: Min-Moderate modification of tasks or assist with assess necessary to complete an evaluation.  OT OCCUPATIONAL PROFILE AND HISTORY: Detailed assessment: Review of records and additional review of physical, cognitive, psychosocial history related to current functional performance.  CLINICAL DECISION MAKING: LOW - limited treatment options, no task modification necessary  REHAB POTENTIAL: Good  EVALUATION COMPLEXITY: Moderate    PLAN:  OT FREQUENCY: 2x/week  OT DURATION: 8 weeks  PLANNED INTERVENTIONS: self care/ADL training, therapeutic exercise, therapeutic activity, neuromuscular re-education, manual therapy, passive range of motion, balance training, functional mobility training, electrical stimulation, ultrasound, moist heat, cryotherapy, patient/family education, psychosocial skills training, energy conservation, coping strategies training, and DME and/or AE instructions  RECOMMENDED OTHER SERVICES: NA  CONSULTED AND AGREED WITH PLAN OF CARE: Patient  PLAN FOR NEXT SESSION: Review and add to shoulder ROM as appropriate, Initiate [place] tasks and HEP   Keylah Darwish, [place], OTR/L 12/20/2022, 8:12 AM

## 2022-12-20 NOTE — Telephone Encounter (Signed)
Patient wife called and left a message for call back this morning

## 2022-12-20 NOTE — Telephone Encounter (Signed)
Return patient wife called and informed her that the referral was sent for gerd and he needed a visit before scheduling a colonoscopy. Schedule him a mychart visit with Dr. Allen Norris on 02/06/2023

## 2022-12-21 ENCOUNTER — Ambulatory Visit: Payer: Commercial Managed Care - PPO | Admitting: Occupational Therapy

## 2022-12-21 DIAGNOSIS — R208 Other disturbances of skin sensation: Secondary | ICD-10-CM | POA: Diagnosis not present

## 2022-12-21 DIAGNOSIS — R278 Other lack of coordination: Secondary | ICD-10-CM

## 2022-12-21 DIAGNOSIS — M79642 Pain in left hand: Secondary | ICD-10-CM | POA: Diagnosis not present

## 2022-12-21 DIAGNOSIS — M6281 Muscle weakness (generalized): Secondary | ICD-10-CM | POA: Diagnosis not present

## 2022-12-21 DIAGNOSIS — M79602 Pain in left arm: Secondary | ICD-10-CM

## 2022-12-21 DIAGNOSIS — R29818 Other symptoms and signs involving the nervous system: Secondary | ICD-10-CM | POA: Diagnosis not present

## 2022-12-21 DIAGNOSIS — R2681 Unsteadiness on feet: Secondary | ICD-10-CM | POA: Diagnosis not present

## 2022-12-21 DIAGNOSIS — R2689 Other abnormalities of gait and mobility: Secondary | ICD-10-CM | POA: Diagnosis not present

## 2022-12-21 DIAGNOSIS — G959 Disease of spinal cord, unspecified: Secondary | ICD-10-CM | POA: Diagnosis not present

## 2022-12-21 NOTE — Therapy (Signed)
OUTPATIENT PHYSICAL THERAPY NEURO TREATMENT   Patient Name: Johnny Robinson MRN: [id number] DOB:06/20/1966, 57 y.o., male Today's Date: 12/24/2022   PCP:  Marguarite ArbourSparks, Jeffrey D, MD   REFERRING PROVIDER: Jacquelynn CreeLove, Pamela S, PA-C  END OF SESSION:  PT End of Session - 12/24/22 1608     Visit Number 4    Number of Visits 13    Date for PT Re-Evaluation 01/17/23    Authorization Type [facility] [employer]    Authorization - Visit Number 4    Authorization - Number of Visits 25    PT Start Time 1531    PT Stop Time 1609    PT Time Calculation (min) 38 min    Equipment Utilized During Treatment Gait belt    Activity Tolerance Patient tolerated treatment well    Behavior During Therapy WFL for tasks assessed/performed               Past Medical History:  Diagnosis Date   Arthritis    Chronic kidney disease 2016   RENAL INFARCT   GERD (gastroesophageal reflux disease)    Hypertension    Reflux    Subdural hematoma 1991   Past Surgical History:  Procedure Laterality Date   BACK SURGERY  11/2017   burr hole  1992   to evacuate [facility]   COLONOSCOPY WITH PROPOFOL N/A 09/02/2018   Procedure: COLONOSCOPY WITH PROPOFOL;  Surgeon: Midge MiniumWohl, Darren, MD;  [facility];  Service: Endoscopy;  Laterality: N/A;   EXCISION MASS ABDOMINAL Left 05/28/2018   Procedure: EXCISIONAL BIOPSY MASS ABDOMINAL WALL;  Surgeon: Carolan Shiverintron-Diaz, Edgardo, MD;  Location: [facility] ORS;  Service: General;  Laterality: Left;   PERIPHERAL VASCULAR CATHETERIZATION Right 05/18/2015   Procedure: Renal Angiography;  Surgeon: Annice NeedyJason S Dew, MD;  Location: [facility] INVASIVE CV LAB;  Service: Cardiovascular;  Laterality: Right;   PERIPHERAL VASCULAR CATHETERIZATION Bilateral 05/18/2015   Procedure: Renal Intervention;  Surgeon: Annice NeedyJason S Dew, MD;  Location: [facility] INVASIVE CV LAB;  Service: Cardiovascular;  Laterality: Bilateral;   TOTAL HIP ARTHROPLASTY Left 03/07/2022   Procedure: TOTAL HIP ARTHROPLASTY ANTERIOR APPROACH;  Surgeon: Ollen GrossAluisio,  Frank, MD;  Location: [facility] ORS;  Service: Orthopedics;  Laterality: Left;   Patient Active Problem List   Diagnosis Date Noted   Left shoulder pain 11/30/2022   Urinary hesitancy 11/30/2022   Constipation 11/30/2022   Biceps tendinitis of left shoulder 11/30/2022   Multifocal pneumonia 11/30/2022   Chronic pain syndrome 11/21/2022   HNP (herniated nucleus pulposus) with myelopathy, cervical 11/15/2022   Cervical myelopathy 11/13/2022   OA (osteoarthritis) of hip 03/07/2022   Primary osteoarthritis of left hip 03/07/2022   GERD (gastroesophageal reflux disease) 11/27/2021   Encounter for screening colonoscopy    Benign neoplasm of rectosigmoid junction    Hyperlipidemia 12/18/2017   Essential hypertension 12/17/2016   Chest pain 05/30/2015   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 Had a lot of muscular soreness after last session.   Pt accompanied by: self  PERTINENT HISTORY: CKD, GERD, HTN, subdural hematoma 1991, back surgery 2019, L THA 2023  PAIN:  Are you having pain? Yes: NPRS scale: 0/10 Pain location: L arm Pain description: ache Aggravating factors: raising arm Relieving factors: meds  PRECAUTIONS: Cervical and Fall  WEIGHT BEARING RESTRICTIONS: No  FALLS: Has patient fallen in last 6 months? No  LIVING ENVIRONMENT: Lives with: lives with their spouse Lives in: House/apartment Stairs:  4 steps to enter  without handrail; 3 story home  Has following equipment at home: Counselling psychologistQuad cane small base, Environmental consultantWalker - 2 wheeled, Tour managerhower bench, and Grab bars  PLOF: Independent, Vocation/Vocational requirements: standing, walking, some desk work, and Leisure: tennis (has not done this since THA 2023)  PATIENT GOALS: return to tennis and fishing   OBJECTIVE:    TODAY'S TREATMENT: 12/24/22 Activity Comments  Scifit Les only L2.5 x 6 min    single leg heel raise 2x5 Limited ROM and quick to fatigue  Sitting heel raise with 15# on each knee 10x Improved ROM  forward step down 4" 10x  B handrail support  L single leg squat to 64cm mat 2x8 Using 1 hand support on chair; cueing to extend standing LE  single leg bridge 2x10 each LE  Limited ROM  sidelying hip ABD/ADD 2# 10x each  Cueing for proper alignment; good form  bird dog 10x  Manual assist to maintain good hip stability  Child's pose 5x5" Good tolerance   quadruped fire hydrant 10x  Manual assist to lock out L/R elbow        HOME EXERCISE PROGRAM Last updated: 4/424 Access Code: [email or web address] URL: [email or web address] Date: 12/20/2022 [facility] - [facility]patient  Re[facility] - [facility]  Exercises - Seated Hamstring Curl with Anchored Resistance  - 1 x daily - 5 x weekly - 2 sets - 10 reps - Marching with Resistance  - 1 x daily - 5 x weekly - 2 sets - 10 reps - Single Leg Bridge  - 1 x daily - 5 x weekly - 2 sets - 10 reps - Single Leg Squat with Chair Touch  - 1 x daily - 5 x weekly - 2 sets - 8 reps - Forward Step Down  - 1 x daily - 5 x weekly - 2 sets - 10 reps - Single Leg Heel Raise with Counter Support  - 1 x daily - 5 x weekly - 2 sets - 5-10 reps - Standing Gastroc Stretch on Foam 1/2 Roll  - 1 x daily - 5 x weekly - 2 sets - 30 sec hold   Below measures were taken at time of initial evaluation unless otherwise specified:    DIAGNOSTIC FINDINGS: 11/13/22 cervical MRI:  Postoperative changes from prior ACDF at C3  through C6 without significant residual spinal stenosis.Patchy signal abnormality involving the central and left cord at the level of C5-6, concerning for contusion/injury.  11/23/22 cervical xray: Prior ACDF from C3-C6. Intact hardware without evidence of loosening. Persistent prevertebral soft tissue swelling.Mild degenerative disc disease at C6-C7. Mild to moderate multilevel facet arthropathy.  COGNITION: Overall cognitive status: Within functional limits for tasks assessed   SENSATION: WFL  COORDINATION: Alternating pronation/supination: limited ROM on L and slow Alternating toe tap: intact B Finger to nose: unable on L d/t weakness, R UE intact Heel to shin: limited ROM on L d/t weakness    MUSCLE  TONE: intact B LEs   POSTURE: rounded shoulders and flexed trunk   LOWER EXTREMITY ROM:     Active  Right Eval Left Eval  Hip flexion    Hip extension    Hip abduction    Hip adduction    Hip internal rotation    Hip external rotation    Knee flexion    Knee extension    Ankle dorsiflexion 20 3  Ankle plantarflexion    Ankle inversion    Ankle eversion     (Blank rows = not tested)  LOWER EXTREMITY MMT:    MMT (in sitting) Right Eval Left Eval  Hip flexion 4+ 4-  Hip extension    Hip abduction 4 4-  Hip adduction 4 4-  Hip internal rotation    Hip external rotation    Knee flexion 5 4-  Knee extension 5 4  Ankle dorsiflexion 4+ 4  Ankle plantarflexion 4 4-  Ankle inversion    Ankle eversion    (Blank rows = not tested)   GAIT: Gait pattern: Decreased R step length, slightly decreased L knee flexion during swing through, good foot clearance and step through pattern Assistive device utilized: None Level of assistance: Modified independence   FUNCTIONAL TESTS:        PATIENT EDUCATION: Education details: prognosis, POC, HEP Person educated: Patient Education method: Explanation, Demonstration, Tactile cues, Verbal cues, and Handouts Education  comprehension: verbalized understanding  HOME EXERCISE PROGRAM: Access Code: [email or web address] URL: [email or web address] Date: 12/06/2022 Prepared by: [facility] - Outpatient  Rehab - [facility]  Exercises - Seated Hamstring Curl with Anchored Resistance  - 1 x daily - 5 x weekly - 2 sets - 10 reps - Marching with Resistance  - 1 x daily - 5 x weekly - 2 sets - 10 reps - Squat with Chair Touch  - 1 x daily - 5 x weekly - 2 sets - 10 reps - Heel Raises with Counter Support  - 1 x daily - 5 x weekly - 2 sets - 10 reps   GOALS: Goals reviewed with patient? Yes  SHORT TERM GOALS: Target date: 12/27/2022  Patient to be independent with initial HEP. Baseline: HEP initiated Goal status: MET 12/24/22    LONG TERM GOALS: Target date: 01/17/2023  Patient to be independent with advanced HEP. Baseline: Not yet initiated  Goal status: IN PROGRESS  Patient to demonstrate B LE strength >/=4+/5.  Baseline: See above Goal status: IN PROGRESS  Patient to demonstrate alternating reciprocal pattern when ascending and descending stairs with good stability and no handrail.  Baseline: requires handrail Goal status: IN PROGRESS  Patient to score at least 26/30 on FGA in order to decrease risk of falls.  Baseline: 23/30 Goal status: IN PROGRESS  Patient to demonstrate symmetrical step length and knee flexion during swing phase with ambulation without AD with good stability.  Baseline: unable Goal status: IN PROGRESS  Patient to return to exercise regimen with modifications as needed.  Baseline: not initiated Goal status: IN PROGRESS    ASSESSMENT:  CLINICAL IMPRESSION: Patient arrived to session with report of muscular soreness after last PT session Reviewed newest HEP update and provided cues for full ROM. Progressed hip strengthening with patient demonstrating good muscle activation with L LE. Initiated quadruped activities to improved core and hip stability. Patient tolerated  well and denied L UE pain with these activities. Manual cues required intermittently for equal utilization of all 4 limbs and avoiding hip rotation. Patient tolerated  session without complaints.      OBJECTIVE IMPAIRMENTS: Abnormal gait, decreased activity tolerance, decreased balance, decreased endurance, difficulty walking, decreased ROM, decreased strength, improper body mechanics, postural dysfunction, and pain.   ACTIVITY LIMITATIONS: carrying, lifting, standing, squatting, stairs, transfers, bathing, toileting, dressing, reach over head, hygiene/grooming, locomotion level, and caring for others  PARTICIPATION LIMITATIONS: meal prep, cleaning, laundry, driving, shopping, community activity, occupation, yard work, and church  PERSONAL FACTORS: Age, Fitness, Past/current experiences, Time since onset of injury/illness/exacerbation, and 3+ comorbidities: CKD, GERD, HTN, subdural hematoma 1991, back surgery 2019, L THA 2023, C3-6 ACDF 11/12/22  are also affecting patient's functional outcome.   REHAB POTENTIAL: Good  CLINICAL DECISION MAKING: Evolving/moderate complexity  EVALUATION COMPLEXITY: Moderate  PLAN:  PT FREQUENCY: 1-2x/week  PT DURATION: 6 weeks  PLANNED INTERVENTIONS: Therapeutic exercises, Therapeutic activity, Neuromuscular re-education, Balance training, Gait training, Patient/Family education, Self Care, Joint mobilization, Stair training, Vestibular training, Canalith repositioning, Aquatic Therapy, Dry Needling, Electrical stimulation, Cryotherapy, Moist heat, Taping, Manual therapy, and Re-evaluation  PLAN FOR NEXT SESSION: review HEP; progress L LE strengthening, high level balance, stairs   Anette Guarneri, PT, DPT 12/24/22 4:10 PM  [facility] at [facility] [address], [address] [place], [place] [address] Phone # [phone] Fax # [phone]

## 2022-12-21 NOTE — Therapy (Signed)
OUTPATIENT OCCUPATIONAL THERAPY NEURO Treatment Session  Patient Name: Johnny Robinson MRN: [id number] DOB:[birth year], 57 y.o., male Today's Date: 12/21/2022  PCP: Marguarite Arbour, MD REFERRING PROVIDER: Jacquelynn Cree, PA-C  END OF SESSION:  OT End of Session - 12/21/22 1024     Visit Number 3    Number of Visits 17    Date for OT Re-Evaluation 02/01/23    Authorization Type [employer] employee - [employer] PPO    OT Start Time 1021    OT Stop Time 1101    OT Time Calculation (min) 40 min               Past Medical History:  Diagnosis Date   Arthritis    Chronic kidney disease 2016   RENAL INFARCT   GERD (gastroesophageal reflux disease)    Hypertension    Reflux    Subdural hematoma 1991   Past Surgical History:  Procedure Laterality Date   BACK SURGERY  11/2017   burr hole  1992   to evacuate SDH   COLONOSCOPY WITH PROPOFOL N/A 09/02/2018   Procedure: COLONOSCOPY WITH PROPOFOL;  Surgeon: Midge Minium, MD;  Location: [facility] ENDOSCOPY;  Service: Endoscopy;  Laterality: N/A;   EXCISION MASS ABDOMINAL Left 05/28/2018   Procedure: EXCISIONAL BIOPSY MASS ABDOMINAL WALL;  Surgeon: Carolan Shiver, MD;  Location: [facility] ORS;  Service: General;  Laterality: Left;   PERIPHERAL VASCULAR CATHETERIZATION Right 05/18/2015   Procedure: Renal Angiography;  Surgeon: Annice Needy, MD;  Location: [facility] INVASIVE CV LAB;  Service: Cardiovascular;  Laterality: Right;   PERIPHERAL VASCULAR CATHETERIZATION Bilateral 05/18/2015   Procedure: Renal Intervention;  Surgeon: Annice Needy, MD;  Location: [facility] INVASIVE CV LAB;  Service: Cardiovascular;  Laterality: Bilateral;   TOTAL HIP ARTHROPLASTY Left 03/07/2022   Procedure: TOTAL HIP ARTHROPLASTY ANTERIOR APPROACH;  Surgeon: Ollen Gross, MD;  Location: [facility] ORS;  Service: Orthopedics;  Laterality: Left;   Patient Active Problem List   Diagnosis Date Noted   Left shoulder pain 11/30/2022   Urinary hesitancy 11/30/2022   Constipation  11/30/2022   Biceps tendinitis of left shoulder 11/30/2022   Multifocal pneumonia 11/30/2022   Chronic pain syndrome 11/21/2022   HNP (herniated nucleus pulposus) with myelopathy, cervical 11/15/2022   Cervical myelopathy 11/13/2022   OA (osteoarthritis) of hip 03/07/2022   Primary osteoarthritis of left hip 03/07/2022   GERD (gastroesophageal reflux disease) 11/27/2021   Encounter for screening colonoscopy    Benign neoplasm of rectosigmoid junction    Hyperlipidemia 12/18/2017   Essential hypertension 12/17/2016   Chest pain 05/30/2015   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 Dr. Jordan Likes expressed concerns of cord contusion due to  worsening of myelopathy and MRI spine done which showed post op changes form ACDF C3-C6 and patchy signal abnormality involving the central and left cord at C5/6 concerning for contusion/injury.  PRECAUTIONS: Cervical and Other: lifting precautions not > 5#  WEIGHT BEARING RESTRICTIONS: No  PAIN:  Are you having pain? Yes: NPRS scale: 3/10 Pain location: from neck to L elbow Pain  description: constant Aggravating factors: not staying on schedule pain pills Relieving factors: taking pain pills  FALLS: Has patient fallen in last 6 months? No  LIVING ENVIRONMENT: Lives with: lives with their spouse and 57 yo Lives in: House/apartment Stairs: Yes: Internal: full flight steps; on right going up and External: 4 steps; none Has following equipment at home: Quad cane small base, Walker - 2 wheeled, shower chair, bed side commode, and hand held shower head  PLOF: Independent, Independent with basic ADLs, and Vocation/Vocational requirements: practices law  PATIENT GOALS: pt would like to get arm progressing like leg   OBJECTIVE:   HAND DOMINANCE: Right  ADLs: Overall ADLs: requires increased time and thought to complete Transfers/ambulation related to ADLs: Mod I with SBQC Eating: requires assistance to cut food Grooming: Mod I UB Dressing: requiring assistance with buttons LB Dressing: requiring assistance with socks/tying shoes Toileting: Mod I Bathing: Mod I Tub Shower transfers: Mod I, has built in shower seat Equipment: Walk in shower  IADLs: Light housekeeping: has attempted vacuuming without issue Meal Prep: has attempted cooking, spouse or children assisting with retrieving items if >5# Medication management: spouse is filling pillbox and is reminding him to take  MOBILITY STATUS: Needs Assist: Mod I - Supervision with SBQC  POSTURE COMMENTS:  rounded shoulders and forward head  FUNCTIONAL OUTCOME MEASURES: Upper Extremity Functional Scale (UEFS): 33/80 =  41.25% function  UPPER EXTREMITY ROM:    Active ROM Right eval Left eval  Shoulder flexion  88 (requiring abduction to achieve)  Shoulder abduction  85  Shoulder adduction    Shoulder extension    Shoulder internal rotation  90%  Shoulder external rotation  30%  Elbow flexion  100  Elbow extension  WNL  Wrist flexion  WNL  Wrist extension  To neutral  Wrist ulnar deviation  trace   Wrist radial deviation  trace  Wrist pronation    Wrist supination  To neutral  (Blank rows = not tested)  UPPER EXTREMITY MMT:     MMT Right eval Left eval  Shoulder flexion  2-/5  Shoulder abduction    Shoulder adduction    Shoulder extension    Shoulder internal rotation    Shoulder external rotation    Middle trapezius    Lower trapezius    Elbow flexion  4-/5  Elbow extension  4-/5  Wrist flexion  3-/5  Wrist extension  3-/5  Wrist ulnar deviation    Wrist radial deviation    Wrist pronation    Wrist supination    (Blank rows = not tested)  HAND FUNCTION: Loose gross grasp, unable to assess strength on dynamometer  COORDINATION: Finger Nose Finger test: able to reach chin to finger with LUE, decreased shoulder ROM limiting movement 9 Hole Peg test: Right: 23.03 sec; Left: able to place 5 pegs in 2 min time limit (pt needing to use side of container/multiple pegs to leverage to pick up one peg, also with difficulty with manipulation/translation in finger tips to orient to place in peg hole) Box and Blocks:  Right 44 blocks, Left 32 blocks  SENSATION: Reports  duller sensation on L compared to R  COGNITION: Overall cognitive status: Within functional limits for tasks assessed  VISION: Subjective report: no changes Baseline vision: Wears glasses for reading only   TODAY'S TREATMENT:                                                                                       12/21/22 Saebo stim one: discussed application for elbow flexion and extension during exercises from previous session.  Discussed and showed pictures of application position for elbow flexion and to incorporate during bicep curls with 2# dowel.  Discussed and showed pictures of application position for elbow extension and to incorporate during modifed PNF pattern exercise.  Discussed movement with and without stimulation and how to sync up stimulation.  Pt deferred attempting it during session due to  wearing a button up shirt requiring increased effort to button/unbutton. Newman Pies toss: engaged in ball toss to self, bouncing ball, and then ball toss with therapist.  OT providing verbal and demonstration cues to utilize BUE together and to toss and/or bounce ball from hand to hand to facilitate use of LUE into ball toss/catch.  Transitioned to tossing therapist <> pt allowing OT to provide feedback and cues for increased incorporation of LUE into task.  Discussed potential benefit from saebo stim application at triceps during ball toss. Resistive clothespins: 2# with LUE for low functional reaching and sustained pinch, incorporating supination and pronation when placing and removing clothespins.  Pt demonstrating most difficulty with supination due to continued weakness in upper arm.    12/20/22 Shoulder ROM: Engaged in snow angel x10 with focus on increased L shoulder ROM - pt demonstrating improved ROM with repetition.  Engaged in open book stretch with focus on motor control of LUE with horizontal abduction/adduction.  Pt unable to achieve full midline but focusing on motor control with movement.  Wall slides with shoulder flexion scaption in standing with focus on improved ROM and tolerance at increased ROM.  OT modified PNF pattern diagonal to allow for increased mobility within accessible ROM in upright position. Weight: Pt asking questions about use of light weight dumbbells for forearm strengthening.  Engaged in supination/pronation, wrist flexion/extension, and elbow flexion with 2# dumbbell.  Pt requiring support/stabilization at table top for increased isolation as pt with ability to complete supination/pronation and elbow flexion only in stabilized position as pt still with decreased shoulder strength to maintain position unsupported.  Coordination: picking up, stacking, and rotating checker pieces in hand. Pt demonstrating initial mild difficulty with rotating of checker pieces in finger tips,  however with repetition demonstrating improvements.  Pt able to pick up and stack with minimal difficulty.  PATIENT EDUCATION: Education details: ongoing condition specific education and UE ROM and strengthening exercises Person educated: Patient Education method: Explanation, Demonstration, Verbal cues, and Handouts Education comprehension: verbalized understanding and needs further education  HOME EXERCISE PROGRAM: Access Code: [email or web address] URL: [email or web address] Date: 12/20/2022 Prepared by: [facility] [place] - Outpatient  Rehab - [facility]  Exercises - [facility]  - 1-2 x daily - 2 sets - 10 reps - Open Book Chest Rotation Stretch on Foam 1/2 Roll  - 1-2  x daily - 2 sets - 10 reps - Shoulder PNF D2 Flexion  - 1-2 x daily - 2 sets - 10 reps - Shoulder Flexion Wall Slide with Towel  - 1-2 x daily - 2 sets - 10 reps - Forearm Supination with Dumbbell  - 1 x daily - 2 sets - 10 reps - Seated Wrist Flexion with Dumbbell  - 1 x daily - 2 sets - 1 reps - Seated Single Arm Bicep Curls Supinated with Dumbbell  - 1 x daily - 2 sets - 10 reps   GOALS: Goals reviewed with patient? Yes  SHORT TERM GOALS: Target date: 01/04/23  Pt will be independent with coordination and ROM HEP. Baseline: Goal status: IN PROGRESS  2.  Pt will demonstrate increased coordination to be able to place all 9 pegs into peg board during 9 hole peg test in 2 min time limit. Baseline: able to place 5 in 2 min time limit Goal status: IN PROGRESS  3.  Pt will demonstrate increased L shoulder ROM to allow for ability to retreive light weight object from shoulder height cabinet. Baseline: 88* shoulder flexion with abduction Goal status: IN PROGRESS  4.  Pt will demonstrate improved pinch and coordination as needed to complete clothing fasteners (buttons, tying shoes, etc). Baseline:  Goal status: IN PROGRESS   LONG TERM GOALS: Target date: 02/01/23  Pt will be independent with advanced  HEP. Baseline:  Goal status: IN PROGRESS  2.  Pt will demonstrate improved UE functional use for ADLs as evidenced by increasing box/ blocks score by 6 blocks with LUE Baseline: Right 44 blocks, Left 32 blocks Goal status: IN PROGRESS  3.  Pt will demonstrate improved fine motor coordination for ADLs as evidenced by being able to complete 9 hole peg test (place and remove) in 2 min time limit Baseline: able to place 5 in 2 min time limit Goal status: IN PROGRESS  4.  Pt will demonstrate increased L shoulder ROM and strength to tolerate holding phone to ear for 5 mins phone conversation. Baseline:  Goal status: IN PROGRESS  5.  Pt will report improved LUE functional use as evidenced by increased score on UEFS to > 60%. Baseline: 41.25% Goal status: IN PROGRESS    ASSESSMENT:  CLINICAL IMPRESSION: Pt demonstrating good motivation to engage in ball toss/catch activity, however continues to demonstrate decreased LUE involvement into task benefiting from verbal and demonstration cues. Pt pleased with recommendations to incorporate Saebo stim one into ball toss, PNF, and elbow flexion/extension exercises.    PERFORMANCE DEFICITS: in functional skills including ADLs, IADLs, coordination, dexterity, sensation, ROM, strength, pain, Fine motor control, Gross motor control, balance, body mechanics, endurance, decreased knowledge of precautions, decreased knowledge of use of DME, and UE functional use and psychosocial skills including coping strategies and environmental adaptation.   IMPAIRMENTS: are limiting patient from ADLs, IADLs, rest and sleep, and work.   CO-MORBIDITIES: may have co-morbidities  that affects occupational performance. Patient will benefit from skilled OT to address above impairments and improve overall function.  MODIFICATION OR ASSISTANCE TO COMPLETE EVALUATION: Min-Moderate modification of tasks or assist with assess necessary to complete an evaluation.  OT  OCCUPATIONAL PROFILE AND HISTORY: Detailed assessment: Review of records and additional review of physical, cognitive, psychosocial history related to current functional performance.  CLINICAL DECISION MAKING: LOW - limited treatment options, no task modification necessary  REHAB POTENTIAL: Good  EVALUATION COMPLEXITY: Moderate    PLAN:  OT FREQUENCY: 2x/week  OT DURATION:  8 weeks  PLANNED INTERVENTIONS: self care/ADL training, therapeutic exercise, therapeutic activity, neuromuscular re-education, manual therapy, passive range of motion, balance training, functional mobility training, electrical stimulation, ultrasound, moist heat, cryotherapy, patient/family education, psychosocial skills training, energy conservation, coping strategies training, and DME and/or AE instructions  RECOMMENDED OTHER SERVICES: NA  CONSULTED AND AGREED WITH PLAN OF CARE: Patient  PLAN FOR NEXT SESSION: Review and add to shoulder ROM as appropriate, Initiate FMC tasks and HEP   Titania Gault, OTR/L 12/21/2022, 10:30 AM

## 2022-12-24 ENCOUNTER — Ambulatory Visit: Payer: Commercial Managed Care - PPO | Admitting: Physical Therapy

## 2022-12-24 ENCOUNTER — Encounter: Payer: Self-pay | Admitting: Physical Therapy

## 2022-12-24 ENCOUNTER — Ambulatory Visit: Payer: Commercial Managed Care - PPO | Admitting: Occupational Therapy

## 2022-12-24 DIAGNOSIS — M79642 Pain in left hand: Secondary | ICD-10-CM | POA: Diagnosis not present

## 2022-12-24 DIAGNOSIS — R278 Other lack of coordination: Secondary | ICD-10-CM

## 2022-12-24 DIAGNOSIS — R208 Other disturbances of skin sensation: Secondary | ICD-10-CM

## 2022-12-24 DIAGNOSIS — M6281 Muscle weakness (generalized): Secondary | ICD-10-CM | POA: Diagnosis not present

## 2022-12-24 DIAGNOSIS — M79602 Pain in left arm: Secondary | ICD-10-CM | POA: Diagnosis not present

## 2022-12-24 DIAGNOSIS — R2689 Other abnormalities of gait and mobility: Secondary | ICD-10-CM | POA: Diagnosis not present

## 2022-12-24 DIAGNOSIS — R29818 Other symptoms and signs involving the nervous system: Secondary | ICD-10-CM | POA: Diagnosis not present

## 2022-12-24 DIAGNOSIS — G959 Disease of spinal cord, unspecified: Secondary | ICD-10-CM | POA: Diagnosis not present

## 2022-12-24 DIAGNOSIS — R2681 Unsteadiness on feet: Secondary | ICD-10-CM

## 2022-12-24 NOTE — Therapy (Signed)
OUTPATIENT OCCUPATIONAL THERAPY NEURO Treatment Session  Patient Name: Johnny Robinson MRN: [id number] DOB:1966/07/08, 57 y.o., male Today's Date: 12/24/2022  PCP: Marguarite Arbour, MD REFERRING PROVIDER: Jacquelynn Cree, PA-C  END OF SESSION:  OT End of Session - 12/24/22 1701     Visit Number 4    Number of Visits 17    Date for OT Re-Evaluation 02/01/23    Authorization Type [employer] employee - [employer] PPO    OT Start Time 1450    OT Stop Time 1530    OT Time Calculation (min) 40 min                Past Medical History:  Diagnosis Date   Arthritis    Chronic kidney disease 2016   RENAL INFARCT   GERD (gastroesophageal reflux disease)    Hypertension    Reflux    Subdural hematoma 1991   Past Surgical History:  Procedure Laterality Date   BACK SURGERY  11/2017   burr hole  1992   to evacuate SDH   COLONOSCOPY WITH PROPOFOL N/A 09/02/2018   Procedure: COLONOSCOPY WITH PROPOFOL;  Surgeon: Midge Minium, MD;  Location: [facility] ENDOSCOPY;  Service: Endoscopy;  Laterality: N/A;   EXCISION MASS ABDOMINAL Left 05/28/2018   Procedure: EXCISIONAL BIOPSY MASS ABDOMINAL WALL;  Surgeon: Carolan Shiver, MD;  Location: [facility] ORS;  Service: General;  Laterality: Left;   PERIPHERAL VASCULAR CATHETERIZATION Right 05/18/2015   Procedure: Renal Angiography;  Surgeon: Annice Needy, MD;  Location: [facility] INVASIVE CV LAB;  Service: Cardiovascular;  Laterality: Right;   PERIPHERAL VASCULAR CATHETERIZATION Bilateral 05/18/2015   Procedure: Renal Intervention;  Surgeon: Annice Needy, MD;  Location: [facility] INVASIVE CV LAB;  Service: Cardiovascular;  Laterality: Bilateral;   TOTAL HIP ARTHROPLASTY Left 03/07/2022   Procedure: TOTAL HIP ARTHROPLASTY ANTERIOR APPROACH;  Surgeon: Ollen Gross, MD;  Location: [facility] ORS;  Service: Orthopedics;  Laterality: Left;   Patient Active Problem List   Diagnosis Date Noted   Left shoulder pain 11/30/2022   Urinary hesitancy 11/30/2022   Constipation  11/30/2022   Biceps tendinitis of left shoulder 11/30/2022   Multifocal pneumonia 11/30/2022   Chronic pain syndrome 11/21/2022   HNP (herniated nucleus pulposus) with myelopathy, cervical 11/15/2022   Cervical myelopathy 11/13/2022   OA (osteoarthritis) of hip 03/07/2022   Primary osteoarthritis of left hip 03/07/2022   GERD (gastroesophageal reflux disease) 11/27/2021   Encounter for screening colonoscopy    Benign neoplasm of rectosigmoid junction    Hyperlipidemia 12/18/2017   Essential hypertension 12/17/2016   Chest pain 05/30/2015   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 Dr. Jordan Likes expressed concerns of cord contusion due to worsening  of myelopathy and MRI spine done which showed post op changes form ACDF C3-C6 and patchy signal abnormality involving the central and left cord at C5/6 concerning for contusion/injury.  PRECAUTIONS: Cervical and Other: lifting precautions not > 5#  WEIGHT BEARING RESTRICTIONS: No  PAIN:  Are you having pain? Yes: NPRS scale: 3/10 Pain location: from neck to L elbow Pain description:  constant Aggravating factors: not staying on schedule pain pills Relieving factors: taking pain pills  FALLS: Has patient fallen in last 6 months? No  LIVING ENVIRONMENT: Lives with: lives with their spouse and 65 yo Lives in: House/apartment Stairs: Yes: Internal: full flight steps; on right going up and External: 4 steps; none Has following equipment at home: Quad cane small base, Walker - 2 wheeled, shower chair, bed side commode, and hand held shower head  PLOF: Independent, Independent with basic ADLs, and Vocation/Vocational requirements: practices law  PATIENT GOALS: pt would like to get arm progressing like leg   OBJECTIVE:   HAND DOMINANCE: Right  ADLs: Overall ADLs: requires increased time and thought to complete Transfers/ambulation related to ADLs: Mod I with SBQC Eating: requires assistance to cut food Grooming: Mod I UB Dressing: requiring assistance with buttons LB Dressing: requiring assistance with socks/tying shoes Toileting: Mod I Bathing: Mod I Tub Shower transfers: Mod I, has built in shower seat Equipment: Walk in shower  IADLs: Light housekeeping: has attempted vacuuming without issue Meal Prep: has attempted cooking, spouse or children assisting with retrieving items if >5# Medication management: spouse is filling pillbox and is reminding him to take  MOBILITY STATUS: Needs Assist: Mod I - Supervision with SBQC  POSTURE COMMENTS:  rounded shoulders and forward head  FUNCTIONAL OUTCOME MEASURES: Upper Extremity Functional Scale (UEFS): 33/80 =  41.25% function  UPPER EXTREMITY ROM:    Active ROM Right eval Left eval  Shoulder flexion  88 (requiring abduction to achieve)  Shoulder abduction  85  Shoulder adduction    Shoulder extension    Shoulder internal rotation  90%  Shoulder external rotation  30%  Elbow flexion  100  Elbow extension  WNL  Wrist flexion  WNL  Wrist extension  To neutral  Wrist ulnar deviation  trace  Wrist radial  deviation  trace  Wrist pronation    Wrist supination  To neutral  (Blank rows = not tested)  UPPER EXTREMITY MMT:     MMT Right eval Left eval  Shoulder flexion  2-/5  Shoulder abduction    Shoulder adduction    Shoulder extension    Shoulder internal rotation    Shoulder external rotation    Middle trapezius    Lower trapezius    Elbow flexion  4-/5  Elbow extension  4-/5  Wrist flexion  3-/5  Wrist extension  3-/5  Wrist ulnar deviation    Wrist radial deviation    Wrist pronation    Wrist supination    (Blank rows = not tested)  HAND FUNCTION: Loose gross grasp, unable to assess strength on dynamometer  COORDINATION: Finger Nose Finger test: able to reach chin to finger with LUE, decreased shoulder ROM limiting movement 9 Hole Peg test: Right: 23.03 sec; Left: able to place 5 pegs in 2 min time limit (pt needing to use side of container/multiple pegs to leverage to pick up one peg, also with difficulty with manipulation/translation in finger tips to orient to place in peg hole) Box and Blocks:  Right 44 blocks, Left 32 blocks  SENSATION: Reports duller  sensation on L compared to R  COGNITION: Overall cognitive status: Within functional limits for tasks assessed  VISION: Subjective report: no changes Baseline vision: Wears glasses for reading only   TODAY'S TREATMENT:                                                                                       12/24/22 Wall slides: engaged in wall slides with LUE with focus on ROM within pain tolerance, modified task to utilization with ball with use of BUE to attempt to increase ROM.  Pt completed with ability to increase shoulder ROM without increased pain when utilizing RUE over LUE at end range. Pegs: engaged in coordination activity with medium and small sized pegs with focus on placing, rotating, and in-hand manipulation to challenge pinch and coordination.  OT increased challenge to incorporate reaching across  midline - pt with reports of increased difficulty and min increase in pain with reaching across midline.  OT also encouraging pt to pick up pegs in functional position, as pt frequently over pronating to pick up pegs.  Pt demonstrating difficulty with in-hand manipulation due to decreased sustained grasp, frequently dropping pegs especially as hand fatigues.    12/21/22 Saebo stim one: discussed application for elbow flexion and extension during exercises from previous session.  Discussed and showed pictures of application position for elbow flexion and to incorporate during bicep curls with 2# dowel.  Discussed and showed pictures of application position for elbow extension and to incorporate during modifed PNF pattern exercise.  Discussed movement with and without stimulation and how to sync up stimulation.  Pt deferred attempting it during session due to wearing a button up shirt requiring increased effort to button/unbutton. Newman PiesBall toss: engaged in ball toss to self, bouncing ball, and then ball toss with therapist.  OT providing verbal and demonstration cues to utilize BUE together and to toss and/or bounce ball from hand to hand to facilitate use of LUE into ball toss/catch.  Transitioned to tossing therapist <> pt allowing OT to provide feedback and cues for increased incorporation of LUE into task.  Discussed potential benefit from saebo stim application at triceps during ball toss. Resistive clothespins: 2# with LUE for low functional reaching and sustained pinch, incorporating supination and pronation when placing and removing clothespins.  Pt demonstrating most difficulty with supination due to continued weakness in upper arm.    12/20/22 Shoulder ROM: Engaged in snow angel x10 with focus on increased L shoulder ROM - pt demonstrating improved ROM with repetition.  Engaged in open book stretch with focus on motor control of LUE with horizontal abduction/adduction.  Pt unable to achieve full midline  but focusing on motor control with movement.  Wall slides with shoulder flexion scaption in standing with focus on improved ROM and tolerance at increased ROM.  OT modified PNF pattern diagonal to allow for increased mobility within accessible ROM in upright position. Weight: Pt asking questions about use of light weight dumbbells for forearm strengthening.  Engaged in supination/pronation, wrist flexion/extension, and elbow flexion with 2# dumbbell.  Pt requiring support/stabilization at table top for increased isolation as pt with ability to complete supination/pronation and  elbow flexion only in stabilized position as pt still with decreased shoulder strength to maintain position unsupported.  Coordination: picking up, stacking, and rotating checker pieces in hand. Pt demonstrating initial mild difficulty with rotating of checker pieces in finger tips, however with repetition demonstrating improvements.  Pt able to pick up and stack with minimal difficulty.  PATIENT EDUCATION: Education details: ongoing condition specific education and UE ROM and strengthening exercises Person educated: Patient Education method: Explanation, Demonstration, Verbal cues, and Handouts Education comprehension: verbalized understanding and needs further education  HOME EXERCISE PROGRAM: Access Code: [email or web address] URL: [email or web address] Date: 12/20/2022 Prepared by: [facility] - Outpatient  Rehab - [facility]  Exercises - [facility]  - 1-2 x daily - 2 sets - 10 reps - Open Book Chest Rotation Stretch on Foam 1/2 Roll  - 1-2 x daily - 2 sets - 10 reps - Shoulder PNF D2 Flexion  - 1-2 x daily - 2 sets - 10 reps - Shoulder Flexion Wall Slide with Towel  - 1-2 x daily - 2 sets - 10 reps - Forearm Supination with Dumbbell  - 1 x daily - 2 sets - 10 reps - Seated Wrist Flexion with Dumbbell  - 1 x daily - 2 sets - 1 reps - Seated Single Arm Bicep Curls Supinated with Dumbbell  - 1 x daily - 2 sets -  10 reps   GOALS: Goals reviewed with patient? Yes  SHORT TERM GOALS: Target date: 01/04/23  Pt will be independent with coordination and ROM HEP. Baseline: Goal status: IN PROGRESS  2.  Pt will demonstrate increased coordination to be able to place all 9 pegs into peg board during 9 hole peg test in 2 min time limit. Baseline: able to place 5 in 2 min time limit Goal status: IN PROGRESS  3.  Pt will demonstrate increased L shoulder ROM to allow for ability to retreive light weight object from shoulder height cabinet. Baseline: 88* shoulder flexion with abduction Goal status: IN PROGRESS  4.  Pt will demonstrate improved pinch and coordination as needed to complete clothing fasteners (buttons, tying shoes, etc). Baseline:  Goal status: IN PROGRESS   LONG TERM GOALS: Target date: 02/01/23  Pt will be independent with advanced HEP. Baseline:  Goal status: IN PROGRESS  2.  Pt will demonstrate improved UE functional use for ADLs as evidenced by increasing box/ blocks score by 6 blocks with LUE Baseline: Right 44 blocks, Left 32 blocks Goal status: IN PROGRESS  3.  Pt will demonstrate improved fine motor coordination for ADLs as evidenced by being able to complete 9 hole peg test (place and remove) in 2 min time limit Baseline: able to place 5 in 2 min time limit Goal status: IN PROGRESS  4.  Pt will demonstrate increased L shoulder ROM and strength to tolerate holding phone to ear for 5 mins phone conversation. Baseline:  Goal status: IN PROGRESS  5.  Pt will report improved LUE functional use as evidenced by increased score on UEFS to > 60%. Baseline: 41.25% Goal status: IN PROGRESS    ASSESSMENT:  CLINICAL IMPRESSION: Pt demonstrating good awareness and understanding of task modifications this session to increase challenge and awareness of hand placement/positioning during functional and structured tasks.  Pt demonstrating increased shoulder ROM and functional  coordination and grasp with medium and small pegs this session.  PERFORMANCE DEFICITS: in functional skills including ADLs, IADLs, coordination, dexterity, sensation, ROM, strength, pain, Fine motor control, Gross motor control, balance, body  mechanics, endurance, decreased knowledge of precautions, decreased knowledge of use of DME, and UE functional use and psychosocial skills including coping strategies and environmental adaptation.   IMPAIRMENTS: are limiting patient from ADLs, IADLs, rest and sleep, and work.   CO-MORBIDITIES: may have co-morbidities  that affects occupational performance. Patient will benefit from skilled OT to address above impairments and improve overall function.  MODIFICATION OR ASSISTANCE TO COMPLETE EVALUATION: Min-Moderate modification of tasks or assist with assess necessary to complete an evaluation.  OT OCCUPATIONAL PROFILE AND HISTORY: Detailed assessment: Review of records and additional review of physical, cognitive, psychosocial history related to current functional performance.  CLINICAL DECISION MAKING: LOW - limited treatment options, no task modification necessary  REHAB POTENTIAL: Good  EVALUATION COMPLEXITY: Moderate    PLAN:  OT FREQUENCY: 2x/week  OT DURATION: 8 weeks  PLANNED INTERVENTIONS: self care/ADL training, therapeutic exercise, therapeutic activity, neuromuscular re-education, manual therapy, passive range of motion, balance training, functional mobility training, electrical stimulation, ultrasound, moist heat, cryotherapy, patient/family education, psychosocial skills training, energy conservation, coping strategies training, and DME and/or AE instructions  RECOMMENDED OTHER SERVICES: NA  CONSULTED AND AGREED WITH PLAN OF CARE: Patient  PLAN FOR NEXT SESSION: Review and add to shoulder ROM as appropriate, Initiate FMC tasks and HEP   Iolanda Folson, OTR/L 12/24/2022, 5:01 PM

## 2022-12-26 ENCOUNTER — Other Ambulatory Visit (HOSPITAL_COMMUNITY): Payer: Self-pay

## 2022-12-26 ENCOUNTER — Ambulatory Visit: Payer: Commercial Managed Care - PPO | Admitting: Physical Therapy

## 2022-12-26 ENCOUNTER — Ambulatory Visit: Payer: Commercial Managed Care - PPO | Admitting: Occupational Therapy

## 2022-12-26 ENCOUNTER — Encounter: Payer: Self-pay | Admitting: Physical Therapy

## 2022-12-26 DIAGNOSIS — M79642 Pain in left hand: Secondary | ICD-10-CM

## 2022-12-26 DIAGNOSIS — R208 Other disturbances of skin sensation: Secondary | ICD-10-CM | POA: Diagnosis not present

## 2022-12-26 DIAGNOSIS — G959 Disease of spinal cord, unspecified: Secondary | ICD-10-CM | POA: Diagnosis not present

## 2022-12-26 DIAGNOSIS — M79602 Pain in left arm: Secondary | ICD-10-CM

## 2022-12-26 DIAGNOSIS — M6281 Muscle weakness (generalized): Secondary | ICD-10-CM

## 2022-12-26 DIAGNOSIS — R2689 Other abnormalities of gait and mobility: Secondary | ICD-10-CM | POA: Diagnosis not present

## 2022-12-26 DIAGNOSIS — R2681 Unsteadiness on feet: Secondary | ICD-10-CM | POA: Diagnosis not present

## 2022-12-26 DIAGNOSIS — R278 Other lack of coordination: Secondary | ICD-10-CM

## 2022-12-26 DIAGNOSIS — R29818 Other symptoms and signs involving the nervous system: Secondary | ICD-10-CM | POA: Diagnosis not present

## 2022-12-26 MED ORDER — GABAPENTIN 300 MG PO CAPS
300.0000 mg | ORAL_CAPSULE | Freq: Three times a day (TID) | ORAL | 3 refills | Status: DC
  Filled 2022-12-26: qty 90, 30d supply, fill #0

## 2022-12-26 NOTE — Therapy (Signed)
OUTPATIENT OCCUPATIONAL THERAPY NEURO Treatment Session  Patient Name: Johnny Robinson MRN: [id number] DOB:Dec 24, 1965, 57 y.o., male Today's Date: 12/26/2022  PCP: Marguarite Arbour, MD REFERRING PROVIDER: Jacquelynn Cree, PA-C  END OF SESSION:  OT End of Session - 12/26/22 1329     Visit Number 5    Number of Visits 17    Date for OT Re-Evaluation 02/01/23    Authorization Type [employer] employee - [employer] P[employer]    OT Start Time [phone]    OT Stop Time 1400    OT Time Calculation (min) 43 min                 Past Medical History:  Diagnosis Date   Arthritis    Chronic kidney disease 2016   RENAL INFARCT   GERD (gastroesophageal reflux disease)    Hypertension    Reflux    Subdural hematoma 1991   Past Surgical History:  Procedure Laterality Date   BACK SURGERY  11/2017   burr hole  1992   to evacuate SDH   COLONOSCOPY WITH PROPOFOL N/A 09/02/2018   Procedure: COLONOSCOPY WITH PROPOFOL;  Surgeon: Midge Minium, MD;  Location: [facility] ENDOSCOPY;  Service: Endoscopy;  Laterality: N/A;   EXCISION MASS ABDOMINAL Left 05/28/2018   Procedure: EXCISIONAL BIOPSY MASS ABDOMINAL WALL;  Surgeon: Carolan Shiver, MD;  Location: [facility] ORS;  Service: General;  Laterality: Left;   PERIPHERAL VASCULAR CATHETERIZATION Right 05/18/2015   Procedure: Renal Angiography;  Surgeon: Annice Needy, MD;  Location: [facility] INVASIVE CV LAB;  Service: Cardiovascular;  Laterality: Right;   PERIPHERAL VASCULAR CATHETERIZATION Bilateral 05/18/2015   Procedure: Renal Intervention;  Surgeon: Annice Needy, MD;  Location: [facility] INVASIVE CV LAB;  Service: Cardiovascular;  Laterality: Bilateral;   TOTAL HIP ARTHROPLASTY Left 03/07/2022   Procedure: TOTAL HIP ARTHROPLASTY ANTERIOR APPROACH;  Surgeon: Ollen Gross, MD;  Location: [facility] ORS;  Service: Orthopedics;  Laterality: Left;   Patient Active Problem List   Diagnosis Date Noted   Left shoulder pain 11/30/2022   Urinary hesitancy 11/30/2022   Constipation  11/30/2022   Biceps tendinitis of left shoulder 11/30/2022   Multifocal pneumonia 11/30/2022   Chronic pain syndrome 11/21/2022   HNP (herniated nucleus pulposus) with myelopathy, cervical 11/15/2022   Cervical myelopathy 11/13/2022   OA (osteoarthritis) of hip 03/07/2022   Primary osteoarthritis of left hip 03/07/2022   GERD (gastroesophageal reflux disease) 11/27/2021   Encounter for screening colonoscopy    Benign neoplasm of rectosigmoid junction    Hyperlipidemia 12/18/2017   Essential hypertension 12/17/2016   Chest pain 05/30/2015   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 Dr. Jordan Likes expressed concerns of cord contusion due to worsening of myelopathy and MRI spine done which showed post  op changes form ACDF C3-C6 and patchy signal abnormality involving the central and left cord at C5/6 concerning for contusion/injury.  PRECAUTIONS: Cervical and Other: lifting precautions not > 5#  WEIGHT BEARING RESTRICTIONS: No  PAIN:  Are you having pain? Yes: NPRS scale: 3/10 Pain location: L shoulder Pain description: constant Aggravating factors: not staying on schedule pain pills Relieving factors:  taking pain pills  FALLS: Has patient fallen in last 6 months? No  LIVING ENVIRONMENT: Lives with: lives with their spouse and 53 yo Lives in: House/apartment Stairs: Yes: Internal: full flight steps; on right going up and External: 4 steps; none Has following equipment at home: Quad cane small base, Walker - 2 wheeled, shower chair, bed side commode, and hand held shower head  PLOF: Independent, Independent with basic ADLs, and Vocation/Vocational requirements: practices law  PATIENT GOALS: pt would like to get arm progressing like leg   OBJECTIVE:   HAND DOMINANCE: Right  ADLs: Overall ADLs: requires increased time and thought to complete Transfers/ambulation related to ADLs: Mod I with SBQC Eating: requires assistance to cut food Grooming: Mod I UB Dressing: requiring assistance with buttons LB Dressing: requiring assistance with socks/tying shoes Toileting: Mod I Bathing: Mod I Tub Shower transfers: Mod I, has built in shower seat Equipment: Walk in shower  IADLs: Light housekeeping: has attempted vacuuming without issue Meal Prep: has attempted cooking, spouse or children assisting with retrieving items if >5# Medication management: spouse is filling pillbox and is reminding him to take  MOBILITY STATUS: Needs Assist: Mod I - Supervision with SBQC  POSTURE COMMENTS:  rounded shoulders and forward head  FUNCTIONAL OUTCOME MEASURES: Upper Extremity Functional Scale (UEFS): 33/80 =  41.25% function  UPPER EXTREMITY ROM:    Active ROM Right eval Left eval  Shoulder flexion  88 (requiring abduction to achieve)  Shoulder abduction  85  Shoulder adduction    Shoulder extension    Shoulder internal rotation  90%  Shoulder external rotation  30%  Elbow flexion  100  Elbow extension  WNL  Wrist flexion  WNL  Wrist extension  To neutral  Wrist ulnar deviation  trace  Wrist radial deviation  trace  Wrist pronation    Wrist supination  To neutral  (Blank rows =  not tested)  UPPER EXTREMITY MMT:     MMT Right eval Left eval  Shoulder flexion  2-/5  Shoulder abduction    Shoulder adduction    Shoulder extension    Shoulder internal rotation    Shoulder external rotation    Middle trapezius    Lower trapezius    Elbow flexion  4-/5  Elbow extension  4-/5  Wrist flexion  3-/5  Wrist extension  3-/5  Wrist ulnar deviation    Wrist radial deviation    Wrist pronation    Wrist supination    (Blank rows = not tested)  HAND FUNCTION: Loose gross grasp, unable to assess strength on dynamometer  COORDINATION: Finger Nose Finger test: able to reach chin to finger with LUE, decreased shoulder ROM limiting movement 9 Hole Peg test: Right: 23.03 sec; Left: able to place 5 pegs in 2 min time limit (pt needing to use side of container/multiple pegs to leverage to pick up one peg, also with difficulty with manipulation/translation in finger tips to orient to place in peg hole) Box and Blocks:  Right 44 blocks, Left 32 blocks  SENSATION: Reports duller sensation on L compared to R  COGNITION: Overall cognitive status: Within  functional limits for tasks assessed  VISION: Subjective report: no changes Baseline vision: Wears glasses for reading only   TODAY'S TREATMENT:                                                                                       12/26/22 Saebo glide:  Shoulder flexion to ~100*, 2 sets of 10 Shoulder abduction at 45* angle, 2 sets of 10 UE ROM/strengthening Chest press with 1# medicine ball with BUE for strengthening and symmetry and pt with inability to complete with single LUE. PNF pattern diagonals with 1# and then 2# medicine ball with good technique, utilizing BUE for technique and increased facilitation of LUE. Wall pushups: completed x8 and x5 with focus on hand placement and positioning.  OT educating on modifications to hand and feet placement to increase and decrease challenge. Supine shoulder  ROM/strengthening Engaged in shoulder flexion with pt able to achieve ~150* in supine with no reports of pain Single arm chest press and BUE chest press with improved motor control of LUE Completed shoulder flexion with BUE while holding 1# medicine ball for symmetry and increased strengthening.  Discussed functional positioning of UE during each ROM to facilitate increased movement without compensatory movements.   12/24/22 Wall slides: engaged in wall slides with LUE with focus on ROM within pain tolerance, modified task to utilization with ball with use of BUE to attempt to increase ROM.  Pt completed with ability to increase shoulder ROM without increased pain when utilizing RUE over LUE at end range. Pegs: engaged in coordination activity with medium and small sized pegs with focus on placing, rotating, and in-hand manipulation to challenge pinch and coordination.  OT increased challenge to incorporate reaching across midline - pt with reports of increased difficulty and min increase in pain with reaching across midline.  OT also encouraging pt to pick up pegs in functional position, as pt frequently over pronating to pick up pegs.  Pt demonstrating difficulty with in-hand manipulation due to decreased sustained grasp, frequently dropping pegs especially as hand fatigues.    12/21/22 Saebo stim one: discussed application for elbow flexion and extension during exercises from previous session.  Discussed and showed pictures of application position for elbow flexion and to incorporate during bicep curls with 2# dowel.  Discussed and showed pictures of application position for elbow extension and to incorporate during modifed PNF pattern exercise.  Discussed movement with and without stimulation and how to sync up stimulation.  Pt deferred attempting it during session due to wearing a button up shirt requiring increased effort to button/unbutton. Newman PiesBall toss: engaged in ball toss to self, bouncing ball, and  then ball toss with therapist.  OT providing verbal and demonstration cues to utilize BUE together and to toss and/or bounce ball from hand to hand to facilitate use of LUE into ball toss/catch.  Transitioned to tossing therapist <> pt allowing OT to provide feedback and cues for increased incorporation of LUE into task.  Discussed potential benefit from saebo stim application at triceps during ball toss. Resistive clothespins: 2# with LUE for low functional reaching and sustained pinch, incorporating supination and pronation when placing and removing  clothespins.  Pt demonstrating most difficulty with supination due to continued weakness in upper arm.  PATIENT EDUCATION: Education details: ongoing condition specific education and UE ROM and strengthening exercises Person educated: Patient Education method: Explanation, Demonstration, Verbal cues, and Handouts Education comprehension: verbalized understanding and needs further education  HOME EXERCISE PROGRAM: Access Code: [email or web address] URL: [email or web address] Date: 12/20/2022 Prepared by: [facility] - Outpatient  Rehab - [facility]  Exercises - [facility]  - 1-2 x daily - 2 sets - 10 reps - Open Book Chest Rotation Stretch on Foam 1/2 Roll  - 1-2 x daily - 2 sets - 10 reps - Shoulder PNF D2 Flexion  - 1-2 x daily - 2 sets - 10 reps - Shoulder Flexion Wall Slide with Towel  - 1-2 x daily - 2 sets - 10 reps - Forearm Supination with Dumbbell  - 1 x daily - 2 sets - 10 reps - Seated Wrist Flexion with Dumbbell  - 1 x daily - 2 sets - 1 reps - Seated Single Arm Bicep Curls Supinated with Dumbbell  - 1 x daily - 2 sets - 10 reps   GOALS: Goals reviewed with patient? Yes  SHORT TERM GOALS: Target date: 01/04/23  Pt will be independent with coordination and ROM HEP. Baseline: Goal status: IN PROGRESS  2.  Pt will demonstrate increased coordination to be able to place all 9 pegs into peg board during 9 hole peg test in 2  min time limit. Baseline: able to place 5 in 2 min time limit Goal status: IN PROGRESS  3.  Pt will demonstrate increased L shoulder ROM to allow for ability to retreive light weight object from shoulder height cabinet. Baseline: 88* shoulder flexion with abduction Goal status: IN PROGRESS  4.  Pt will demonstrate improved pinch and coordination as needed to complete clothing fasteners (buttons, tying shoes, etc). Baseline:  Goal status: IN PROGRESS   LONG TERM GOALS: Target date: 02/01/23  Pt will be independent with advanced HEP. Baseline:  Goal status: IN PROGRESS  2.  Pt will demonstrate improved UE functional use for ADLs as evidenced by increasing box/ blocks score by 6 blocks with LUE Baseline: Right 44 blocks, Left 32 blocks Goal status: IN PROGRESS  3.  Pt will demonstrate improved fine motor coordination for ADLs as evidenced by being able to complete 9 hole peg test (place and remove) in 2 min time limit Baseline: able to place 5 in 2 min time limit Goal status: IN PROGRESS  4.  Pt will demonstrate increased L shoulder ROM and strength to tolerate holding phone to ear for 5 mins phone conversation. Baseline:  Goal status: IN PROGRESS  5.  Pt will report improved LUE functional use as evidenced by increased score on UEFS to > 60%. Baseline: 41.25% Goal status: IN PROGRESS    ASSESSMENT:  CLINICAL IMPRESSION: Pt demonstrating good awareness and understanding of task modifications this session to increase challenge and awareness of hand placement/positioning during functional and structured tasks.  Pt continues to demonstrate decreased strength on L UE, especially with increased weight and/or ROM 90* against gravity.  Pt demonstrating increased ROM in supine and tolerance to increased weight with BUE or in partial WB position.  PERFORMANCE DEFICITS: in functional skills including ADLs, IADLs, coordination, dexterity, sensation, ROM, strength, pain, Fine motor control,  Gross motor control, balance, body mechanics, endurance, decreased knowledge of precautions, decreased knowledge of use of DME, and UE functional use and psychosocial skills including coping strategies  and environmental adaptation.   IMPAIRMENTS: are limiting patient from ADLs, IADLs, rest and sleep, and work.   CO-MORBIDITIES: may have co-morbidities  that affects occupational performance. Patient will benefit from skilled OT to address above impairments and improve overall function.  MODIFICATION OR ASSISTANCE TO COMPLETE EVALUATION: Min-Moderate modification of tasks or assist with assess necessary to complete an evaluation.  OT OCCUPATIONAL PROFILE AND HISTORY: Detailed assessment: Review of records and additional review of physical, cognitive, psychosocial history related to current functional performance.  CLINICAL DECISION MAKING: LOW - limited treatment options, no task modification necessary  REHAB POTENTIAL: Good  EVALUATION COMPLEXITY: Moderate    PLAN:  OT FREQUENCY: 2x/week  OT DURATION: 8 weeks  PLANNED INTERVENTIONS: self care/ADL training, therapeutic exercise, therapeutic activity, neuromuscular re-education, manual therapy, passive range of motion, balance training, functional mobility training, electrical stimulation, ultrasound, moist heat, cryotherapy, patient/family education, psychosocial skills training, energy conservation, coping strategies training, and DME and/or AE instructions  RECOMMENDED OTHER SERVICES: NA  CONSULTED AND AGREED WITH PLAN OF CARE: Patient  PLAN FOR NEXT SESSION: Review and add to shoulder ROM as appropriate, Initiate FMC tasks and HEP, may be able to engage in resistance bands and/or other strengthening for shoulder   Rayquon Uselman, OTR/L 12/26/2022, 1:30 PM

## 2022-12-26 NOTE — Therapy (Unsigned)
OUTPATIENT PHYSICAL THERAPY NEURO TREATMENT   Patient Name: Johnny Robinson MRN: [id number] DOB:Feb 02, 1966, 57 y.o., male Today's Date: 12/24/2022   PCP:  Marguarite Arbour, MD   REFERRING PROVIDER: Jacquelynn Cree, PA-C  END OF SESSION:  PT End of Session - 12/24/22 1608     Visit Number 4    Number of Visits 13    Date for PT Re-Evaluation 01/17/23    Authorization Type [facility] [employer]    Authorization - Visit Number 4    Authorization - Number of Visits 25    PT Start Time 1531    PT Stop Time 1609    PT Time Calculation (min) 38 min    Equipment Utilized During Treatment Gait belt    Activity Tolerance Patient tolerated treatment well    Behavior During Therapy WFL for tasks assessed/performed               Past Medical History:  Diagnosis Date   Arthritis    Chronic kidney disease 2016   RENAL INFARCT   GERD (gastroesophageal reflux disease)    Hypertension    Reflux    Subdural hematoma 1991   Past Surgical History:  Procedure Laterality Date   BACK SURGERY  11/2017   burr hole  1992   to evacuate SDH   COLONOSCOPY WITH PROPOFOL N/A 09/02/2018   Procedure: COLONOSCOPY WITH PROPOFOL;  Surgeon: Midge Minium, MD;  Location: [facility] ENDOSCOPY;  Service: Endoscopy;  Laterality: N/A;   EXCISION MASS ABDOMINAL Left 05/28/2018   Procedure: EXCISIONAL BIOPSY MASS ABDOMINAL WALL;  Surgeon: Carolan Shiver, MD;  Location: [facility] ORS;  Service: General;  Laterality: Left;   PERIPHERAL VASCULAR CATHETERIZATION Right 05/18/2015   Procedure: Renal Angiography;  Surgeon: Annice Needy, MD;  Location: [facility] INVASIVE CV LAB;  Service: Cardiovascular;  Laterality: Right;   PERIPHERAL VASCULAR CATHETERIZATION Bilateral 05/18/2015   Procedure: Renal Intervention;  Surgeon: Annice Needy, MD;  Location: [facility] INVASIVE CV LAB;  Service: Cardiovascular;  Laterality: Bilateral;   TOTAL HIP ARTHROPLASTY Left 03/07/2022   Procedure: TOTAL HIP ARTHROPLASTY ANTERIOR APPROACH;  Surgeon: Ollen Gross, MD;  Location: [facility] ORS;  Service: Orthopedics;  Laterality: Left;   Patient Active Problem List   Diagnosis Date Noted   Left shoulder pain 11/30/2022   Urinary hesitancy 11/30/2022   Constipation 11/30/2022   Biceps tendinitis of left shoulder 11/30/2022   Multifocal pneumonia 11/30/2022   Chronic pain syndrome 11/21/2022   HNP (herniated nucleus pulposus) with myelopathy, cervical 11/15/2022   Cervical myelopathy 11/13/2022   OA (osteoarthritis) of hip 03/07/2022   Primary osteoarthritis of left hip 03/07/2022   GERD (gastroesophageal reflux disease) 11/27/2021   Encounter for screening colonoscopy    Benign neoplasm of rectosigmoid junction    Hyperlipidemia 12/18/2017   Essential hypertension 12/17/2016   Chest pain 05/30/2015   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  Pt accompanied by: self  PERTINENT HISTORY: CKD, GERD, HTN, subdural hematoma 1991, back surgery 2019, L THA 2023  PAIN:  Are you having pain? Yes: NPRS scale: 3/10 Pain location: L arm Pain description: ache Aggravating factors: raising arm Relieving factors: meds  PRECAUTIONS: Cervical and Fall  WEIGHT BEARING RESTRICTIONS: No  FALLS: Has patient fallen in last 6 months? No  LIVING ENVIRONMENT: Lives with: lives with their spouse Lives in: House/apartment Stairs:  4 steps to enter without handrail; 3 story home  Has following  equipment at home: Counselling psychologist, Environmental consultant - 2 wheeled, Tour manager, and Grab bars  PLOF: Independent, Vocation/Vocational requirements: standing, walking, some desk work, and Leisure: tennis (has not done this since THA 2023)  PATIENT GOALS: return to tennis and fishing   OBJECTIVE:    TODAY'S TREATMENT: 12/26/2022 Activity Comments  NuStep, Level 5>, BLEs only, for leg strengthening x 6 minutes >80-89 SPM  Sit to stand from mat standing on Airex, 2 x 10 reps 2nd set with LLE tucked posteriorly  Sitting heel raise with 15# on each knee 2 x 10 3 sec hold  Dynamic balance: Tandem gait March Tandem march Wearing 2# weight, 3 reps x 20 ft.  Bilat heel toe raises x 10, then stagger stance forward/back weightshift x 10 reps 2# LLE  Solid surface: Feet apart/feet together EO and EC 30 sec  Cues to use visual target to help with stability  Partial tandem stance 15 second EO and EC Mild sway with EC  SLS, EO 3 x 15 sec LLE Light UE support    TODAY'S TREATMENT: 12/24/22 Activity Comments  Scifit Les only L2.5 x 6 min    single leg heel raise 2x5 Limited ROM and quick to fatigue  Sitting heel raise with 15# on each knee 10x Improved ROM  forward step down 4" 10x  B handrail support  L single leg squat to 64cm mat 2x8 Using 1 hand support on chair; cueing to extend standing LE  single leg bridge 2x10 each LE  Limited ROM  sidelying hip ABD/ADD 2# 10x each  Cueing for proper alignment; good form  bird dog 10x  Manual assist to maintain good hip stability  Child's pose 5x5" Good tolerance   quadruped fire hydrant 10x  Manual assist to lock out L/R elbow        HOME EXERCISE PROGRAM Last updated: 4/424 Access Code: [email or web address] URL: [email or web address] Date: 12/20/2022 Prepared by: [facility] - Outpatient  Rehab - [facility]  Exercises - Seated Hamstring Curl with Anchored Resistance  - 1 x daily - 5 x weekly - 2 sets - 10 reps - Marching with Resistance  -  1 x daily - 5 x weekly - 2 sets - 10 reps - Single Leg Bridge  - 1 x daily - 5 x weekly - 2 sets - 10 reps - Single Leg Squat with Chair Touch  - 1 x daily - 5 x weekly - 2 sets - 8 reps - Forward Step Down  - 1 x daily - 5 x weekly - 2 sets - 10 reps - Single Leg Heel Raise with Counter Support  - 1 x daily - 5 x weekly - 2 sets - 5-10 reps - Standing Gastroc Stretch on Foam 1/2 Roll  - 1 x daily - 5 x weekly - 2 sets - 30 sec hold   Below measures were taken at time of initial evaluation  unless otherwise specified:    DIAGNOSTIC FINDINGS: 11/13/22 cervical MRI:  Postoperative changes from prior ACDF at C3 through C6 without significant residual spinal stenosis.Patchy signal abnormality involving the central and left cord at the level of C5-6, concerning for contusion/injury.  11/23/22 cervical xray: Prior ACDF from C3-C6. Intact hardware without evidence of loosening. Persistent prevertebral soft tissue swelling.Mild degenerative disc disease at C6-C7. Mild to moderate multilevel facet arthropathy.  COGNITION: Overall cognitive status: Within functional limits for tasks assessed   SENSATION: WFL  COORDINATION: Alternating pronation/supination: limited ROM on L and slow Alternating toe tap: intact B Finger to nose: unable on L d/t weakness, R UE intact Heel to shin: limited ROM on L d/t weakness    MUSCLE TONE: intact B LEs   POSTURE: rounded shoulders and flexed trunk   LOWER EXTREMITY ROM:     Active  Right Eval Left Eval  Hip flexion    Hip extension    Hip abduction    Hip adduction    Hip internal rotation    Hip external rotation    Knee flexion    Knee extension    Ankle dorsiflexion 20 3  Ankle plantarflexion    Ankle inversion    Ankle eversion     (Blank rows = not tested)  LOWER EXTREMITY MMT:    MMT (in sitting) Right Eval Left Eval  Hip flexion 4+ 4-  Hip extension    Hip abduction 4 4-  Hip adduction 4 4-  Hip internal rotation    Hip  external rotation    Knee flexion 5 4-  Knee extension 5 4  Ankle dorsiflexion 4+ 4  Ankle plantarflexion 4 4-  Ankle inversion    Ankle eversion    (Blank rows = not tested)   GAIT: Gait pattern: Decreased R step length, slightly decreased L knee flexion during swing through, good foot clearance and step through pattern Assistive device utilized: None Level of assistance: Modified independence   FUNCTIONAL TESTS:        PATIENT EDUCATION: Education details: prognosis, POC, HEP Person educated: Patient Education method: Explanation, Demonstration, Tactile cues, Verbal cues, and Handouts Education comprehension: verbalized understanding  HOME EXERCISE PROGRAM: Access Code: [email or web address] URL: [email or web address] Date: 12/06/2022 Prepared by: [facility] - Outpatient  Rehab - [facility]  Exercises - Seated Hamstring Curl with Anchored Resistance  - 1 x daily - 5 x weekly - 2 sets - 10 reps - Marching with Resistance  - 1 x daily - 5 x weekly - 2 sets - 10 reps - Squat with Chair Touch  - 1 x daily - 5 x weekly - 2 sets - 10 reps - Heel Raises with Counter Support  - 1 x daily - 5 x weekly - 2 sets - 10 reps   GOALS: Goals reviewed with patient? Yes  SHORT TERM GOALS: Target date: 12/27/2022  Patient to be independent with initial HEP. Baseline: HEP initiated Goal status: MET 12/24/22    LONG TERM GOALS: Target date: 01/17/2023  Patient to be independent with advanced HEP. Baseline: Not yet initiated  Goal status: IN PROGRESS  Patient to demonstrate B LE strength >/=4+/5.  Baseline: See above Goal status: IN PROGRESS  Patient to demonstrate alternating reciprocal pattern when ascending and descending stairs with good stability and no handrail.  Baseline: requires handrail Goal status: IN PROGRESS  Patient to score at least 26/30 on FGA in order to decrease risk of falls.  Baseline: 23/30 Goal status: IN PROGRESS  Patient to demonstrate  symmetrical step length and knee flexion during swing phase with ambulation without AD with good stability.  Baseline: unable Goal status: IN PROGRESS  Patient to return to exercise regimen with modifications as needed.  Baseline: not initiated Goal status: IN PROGRESS    ASSESSMENT:  CLINICAL IMPRESSION: ***Patient arrived to session with report of muscular soreness after last PT session Reviewed newest HEP update and provided cues for full ROM. Progressed hip strengthening with patient demonstrating good muscle activation with L LE. Initiated quadruped activities to improved core and hip stability. Patient tolerated well and denied L UE pain with these activities. Manual cues required intermittently for equal utilization of all 4 limbs and avoiding hip rotation. Patient tolerated session without complaints.      OBJECTIVE IMPAIRMENTS: Abnormal gait, decreased activity tolerance, decreased balance, decreased endurance, difficulty walking, decreased ROM, decreased strength, improper body mechanics, postural dysfunction, and pain.   ACTIVITY LIMITATIONS: carrying, lifting, standing, squatting, stairs, transfers, bathing, toileting, dressing, reach over head, hygiene/grooming, locomotion level, and caring for others  PARTICIPATION LIMITATIONS: meal prep, cleaning, laundry, driving, shopping, community activity, occupation, yard work, and church  PERSONAL FACTORS: Age, Fitness, Past/current experiences, Time since onset of injury/illness/exacerbation, and 3+ comorbidities: CKD, GERD, HTN, subdural hematoma 1991, back surgery 2019, L THA 2023, C3-6 ACDF 11/12/22  are also affecting patient's functional outcome.   REHAB POTENTIAL: Good  CLINICAL DECISION MAKING: Evolving/moderate complexity  EVALUATION COMPLEXITY: Moderate  PLAN:  PT FREQUENCY: 1-2x/week  PT DURATION: 6 weeks  PLANNED INTERVENTIONS: Therapeutic exercises, Therapeutic activity, Neuromuscular re-education, Balance  training, Gait training, Patient/Family education, Self Care, Joint mobilization, Stair training, Vestibular training, Canalith repositioning, Aquatic Therapy, Dry Needling, Electrical stimulation, Cryotherapy, Moist heat, Taping, Manual therapy, and Re-evaluation  PLAN FOR NEXT SESSION: ***review HEP; progress L LE strengthening, high level balance, stairs   Lonia Bloodmy Ashlan Dignan, PT 12/26/22 12:32 PM Phone: [phone] Fax: [phone]  [facility] [address], [address] [place], [place] [address] Phone # [phone] Fax # [phone]

## 2022-12-27 ENCOUNTER — Ambulatory Visit
Admission: RE | Admit: 2022-12-27 | Discharge: 2022-12-27 | Disposition: A | Discharge status: Home / Self Care | Payer: Commercial Managed Care - PPO | Source: Ambulatory Visit | Attending: Family Medicine | Admitting: Family Medicine

## 2022-12-27 DIAGNOSIS — R29898 Other symptoms and signs involving the musculoskeletal system: Secondary | ICD-10-CM

## 2022-12-27 DIAGNOSIS — G9389 Other specified disorders of brain: Secondary | ICD-10-CM | POA: Diagnosis not present

## 2022-12-27 DIAGNOSIS — R531 Weakness: Secondary | ICD-10-CM | POA: Diagnosis not present

## 2022-12-27 DIAGNOSIS — M47814 Spondylosis without myelopathy or radiculopathy, thoracic region: Secondary | ICD-10-CM | POA: Diagnosis not present

## 2022-12-27 DIAGNOSIS — M5124 Other intervertebral disc displacement, thoracic region: Secondary | ICD-10-CM | POA: Diagnosis not present

## 2022-12-28 NOTE — Therapy (Signed)
OUTPATIENT PHYSICAL THERAPY NEURO TREATMENT   Patient Name: Johnny Robinson MRN: [id number] DOB:1966/01/04, 57 y.o., male Today's Date: 12/31/2022   PCP:  Marguarite Arbour, MD   REFERRING PROVIDER: Jacquelynn Cree, PA-C  END OF SESSION:  PT End of Session - 12/31/22 1526     Visit Number 6    Number of Visits 13    Date for PT Re-Evaluation 01/17/23    Authorization Type [facility] [employer]    Authorization - Visit Number 6    Authorization - Number of Visits 25    PT Start Time 1448    PT Stop Time 1526    PT Time Calculation (min) 38 min    Equipment Utilized During Treatment Gait belt    Activity Tolerance Patient tolerated treatment well    Behavior During Therapy WFL for tasks assessed/performed                Past Medical History:  Diagnosis Date   Arthritis    Chronic kidney disease 2016   RENAL INFARCT   GERD (gastroesophageal reflux disease)    Hypertension    Reflux    Subdural hematoma 1991   Past Surgical History:  Procedure Laterality Date   BACK SURGERY  11/2017   burr hole  1992   to evacuate SDH   COLONOSCOPY WITH PROPOFOL N/A 09/02/2018   Procedure: COLONOSCOPY WITH PROPOFOL;  Surgeon: Midge Minium, MD;  Location: [facility] ENDOSCOPY;  Service: Endoscopy;  Laterality: N/A;   EXCISION MASS ABDOMINAL Left 05/28/2018   Procedure: EXCISIONAL BIOPSY MASS ABDOMINAL WALL;  Surgeon: Carolan Shiver, MD;  Location: [facility] ORS;  Service: General;  Laterality: Left;   PERIPHERAL VASCULAR CATHETERIZATION Right 05/18/2015   Procedure: Renal Angiography;  Surgeon: Annice Needy, MD;  Location: [facility] INVASIVE CV LAB;  Service: Cardiovascular;  Laterality: Right;   PERIPHERAL VASCULAR CATHETERIZATION Bilateral 05/18/2015   Procedure: Renal Intervention;  Surgeon: Annice Needy, MD;  Location: [facility] INVASIVE CV LAB;  Service: Cardiovascular;  Laterality: Bilateral;   TOTAL HIP ARTHROPLASTY Left 03/07/2022   Procedure: TOTAL HIP ARTHROPLASTY ANTERIOR APPROACH;  Surgeon:  Ollen Gross, MD;  Location: [facility] ORS;  Service: Orthopedics;  Laterality: Left;   Patient Active Problem List   Diagnosis Date Noted   Nerve pain 12/31/2022   Left shoulder pain 11/30/2022   Urinary hesitancy 11/30/2022   Constipation 11/30/2022   Biceps tendinitis of left shoulder 11/30/2022   Multifocal pneumonia 11/30/2022   Chronic pain syndrome 11/21/2022   HNP (herniated nucleus pulposus) with myelopathy, cervical 11/15/2022   Cervical myelopathy 11/13/2022   OA (osteoarthritis) of hip 03/07/2022   Primary osteoarthritis of left hip 03/07/2022   GERD (gastroesophageal reflux disease) 11/27/2021   Encounter for screening colonoscopy    Benign neoplasm of rectosigmoid junction    Hyperlipidemia 12/18/2017   Essential hypertension 12/17/2016   Chest pain 05/30/2015   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 Wife did most of it. Is coming off using the pain meds.   Pt accompanied by: self  PERTINENT HISTORY: CKD, GERD, HTN, subdural hematoma 1991, back surgery 2019, L THA 2023  PAIN:  Are you having pain? Yes: NPRS scale: 3/10 Pain location: L arm Pain description: ache Aggravating factors: raising arm Relieving factors: meds  PRECAUTIONS: Cervical and Fall  WEIGHT BEARING RESTRICTIONS: No  FALLS: Has patient fallen in last 6 months? No  LIVING ENVIRONMENT: Lives with: lives  with their spouse Lives in: House/apartment Stairs:  4 steps to enter without handrail; 3 story home  Has following equipment at home: Counselling psychologist, Environmental consultant - 2 wheeled, Tour manager, and Grab bars  PLOF: Independent, Vocation/Vocational requirements: standing, walking, some desk work, and Leisure: tennis (has not done this since THA 2023)  PATIENT GOALS: return to tennis and fishing   OBJECTIVE:     TODAY'S TREATMENT: 12/31/22 Activity Comments  TM 1.7 MPH x 5 min  Cues for wider BOS and heel toe pattern particularly on L side; with and without B UE support  backwards walking + side to side ball toss Cueing for wider BOS  tandem walk + side to side ball toss Good stability but difficulty catching/throwing with L  walking on heels/toes 4x 43ft L heel unable to maintain heel raise; narrow BOS  Alt heel raise 20x  Good ability   wall squats with red TB pball 10x, with with L foot back 10x  Good alignment  Sidelying clam red TB 10x each Much more challenging on L  Modified side plank lift offs laying on R side 10x    bird dog 10x Improved hip stability today but hip strop still evident   modified plank on forearms/knees Able to perform with c/o L UE "shaking" but no pain  Tall plank 2x30" Manual assist to maintain L elbow straight; very difficult       HOME EXERCISE PROGRAM Last updated: 12/31/22 Access Code: [email or web address] URL: [email or web address] Date: 12/31/2022 Prepared by: [facility] - Outpatient  Rehab - [facility]  Exercises - Seated Hamstring Curl with Anchored Resistance  - 1 x daily - 5 x weekly - 2 sets - 10 reps - Marching with Resistance  - 1 x daily - 5 x weekly - 2 sets - 10 reps - Single Leg Bridge  - 1 x daily - 5 x weekly - 2 sets - 10 reps - Single Leg Squat with Chair Touch  - 1 x daily - 5 x weekly - 2 sets - 8 reps - Forward Step Down  - 1 x daily - 5 x weekly - 2 sets - 10 reps - Standing Gastroc Stretch on Foam 1/2 Roll  - 1 x daily  - 5 x weekly - 2 sets - 30 sec hold - Alternating Heel Raises  - 1 x daily - 5 x weekly - 2 sets - 20 reps    PATIENT EDUCATION: Education details: HEP update Person educated: Patient Education method: Explanation, Demonstration, Tactile cues, Verbal cues, and Handouts Education comprehension: verbalized understanding and returned demonstration    Below measures were taken at time of initial evaluation unless otherwise specified:    DIAGNOSTIC FINDINGS: 11/13/22 cervical MRI:  Postoperative changes from prior ACDF at C3 through C6 without significant residual spinal stenosis.Patchy signal abnormality involving the central and left cord at the level of C5-6, concerning for contusion/injury.  11/23/22 cervical xray: Prior ACDF from  C3-C6. Intact hardware without evidence of loosening. Persistent prevertebral soft tissue swelling.Mild degenerative disc disease at C6-C7. Mild to moderate multilevel facet arthropathy.  COGNITION: Overall cognitive status: Within functional limits for tasks assessed   SENSATION: WFL  COORDINATION: Alternating pronation/supination: limited ROM on L and slow Alternating toe tap: intact B Finger to nose: unable on L d/t weakness, R UE intact Heel to shin: limited ROM on L d/t weakness    MUSCLE TONE: intact B LEs   POSTURE: rounded shoulders and flexed trunk   LOWER EXTREMITY ROM:     Active  Right Eval Left Eval  Hip flexion    Hip extension    Hip abduction    Hip adduction    Hip internal rotation    Hip external rotation    Knee flexion    Knee extension    Ankle dorsiflexion 20 3  Ankle plantarflexion    Ankle inversion    Ankle eversion     (Blank rows = not tested)  LOWER EXTREMITY MMT:    MMT (in sitting) Right Eval Left Eval  Hip flexion 4+ 4-  Hip extension    Hip abduction 4 4-  Hip adduction 4 4-  Hip internal rotation    Hip external rotation    Knee flexion 5 4-  Knee extension 5 4  Ankle dorsiflexion 4+ 4   Ankle plantarflexion 4 4-  Ankle inversion    Ankle eversion    (Blank rows = not tested)   GAIT: Gait pattern: Decreased R step length, slightly decreased L knee flexion during swing through, good foot clearance and step through pattern Assistive device utilized: None Level of assistance: Modified independence   FUNCTIONAL TESTS:        PATIENT EDUCATION: Education details: prognosis, POC, HEP Person educated: Patient Education method: Explanation, Demonstration, Tactile cues, Verbal cues, and Handouts Education comprehension: verbalized understanding  HOME EXERCISE PROGRAM: Access Code: [email or web address] URL: [email or web address] Date: 12/06/2022 Prepared by: [facility] - Outpatient  Rehab - [facility]  Exercises - Seated Hamstring Curl with Anchored Resistance  - 1 x daily - 5 x weekly - 2 sets - 10 reps - Marching with Resistance  - 1 x daily - 5 x weekly - 2 sets - 10 reps - Squat with Chair Touch  - 1 x daily - 5 x weekly - 2 sets - 10 reps - Heel Raises with Counter Support  - 1 x daily - 5 x weekly - 2 sets - 10 reps   GOALS: Goals reviewed with patient? Yes  SHORT TERM GOALS: Target date: 12/27/2022  Patient to be independent with initial HEP. Baseline: HEP initiated Goal status: MET 12/24/22    LONG TERM GOALS: Target date: 01/17/2023  Patient to be independent with advanced HEP. Baseline: Not yet initiated  Goal status: IN PROGRESS  Patient to demonstrate B LE strength >/=4+/5.  Baseline: See above Goal status: IN PROGRESS  Patient to demonstrate alternating reciprocal pattern when ascending and descending stairs with good stability and no handrail.  Baseline: requires handrail Goal status: IN PROGRESS  Patient to score at least 26/30 on FGA in order to decrease risk of falls.  Baseline: 23/30 Goal status: IN PROGRESS  Patient to demonstrate symmetrical step length and knee flexion during swing phase with ambulation without AD with  good stability.  Baseline: unable Goal status: IN PROGRESS  Patient to return to exercise regimen with modifications as needed.  Baseline: not initiated Goal status: IN PROGRESS  ASSESSMENT:  CLINICAL IMPRESSION: Patient arrived to session without new complaints. Worked on gait training on treadmill while correcting gait pattern. Patient frequently with narrow BOS and tendency for slight scissoring and L ankle inversion d/t weakness vs. tone. Balance activities were performed with fairly good stability today. More difficulty evident with core and hip strengthening, but seems to be improving. L UE is a limiting factor in quadruped activities. No complaints upon leaving.   OBJECTIVE IMPAIRMENTS: Abnormal gait, decreased activity tolerance, decreased balance, decreased endurance, difficulty walking, decreased ROM, decreased strength, improper body mechanics, postural dysfunction, and pain.   ACTIVITY LIMITATIONS: carrying, lifting, standing, squatting, stairs, transfers, bathing, toileting, dressing, reach over head, hygiene/grooming, locomotion level, and caring for others  PARTICIPATION LIMITATIONS: meal prep, cleaning, laundry, driving, shopping, community activity, occupation, yard work, and church  PERSONAL FACTORS: Age, Fitness, Past/current experiences, Time since onset of injury/illness/exacerbation, and 3+ comorbidities: CKD, GERD, HTN, subdural hematoma 1991, back surgery 2019, L THA 2023, C3-6 ACDF 11/12/22  are also affecting patient's functional outcome.   REHAB POTENTIAL: Good  CLINICAL DECISION MAKING: Evolving/moderate complexity  EVALUATION COMPLEXITY: Moderate  PLAN:  PT FREQUENCY: 1-2x/week  PT DURATION: 6 weeks  PLANNED INTERVENTIONS: Therapeutic exercises, Therapeutic activity, Neuromuscular re-education, Balance training, Gait training, Patient/Family education, Self Care, Joint mobilization, Stair training, Vestibular training, Canalith repositioning, Aquatic  Therapy, Dry Needling, Electrical stimulation, Cryotherapy, Moist heat, Taping, Manual therapy, and Re-evaluation  PLAN FOR NEXT SESSION: Continue to progress L LE strengthening, high level balance, stairs   Anette Guarneri, PT, DPT 12/31/22 3:29 PM  [facility] [address], [address] [place], [place] [address] Phone # [phone] Fax # [phone]

## 2022-12-29 ENCOUNTER — Other Ambulatory Visit (HOSPITAL_COMMUNITY): Payer: Self-pay

## 2022-12-31 ENCOUNTER — Ambulatory Visit: Payer: Commercial Managed Care - PPO | Admitting: Physical Therapy

## 2022-12-31 ENCOUNTER — Other Ambulatory Visit (HOSPITAL_COMMUNITY): Payer: Self-pay

## 2022-12-31 ENCOUNTER — Ambulatory Visit: Payer: Commercial Managed Care - PPO | Admitting: Occupational Therapy

## 2022-12-31 ENCOUNTER — Encounter: Payer: Self-pay | Admitting: Physical Therapy

## 2022-12-31 ENCOUNTER — Encounter: Payer: Self-pay | Admitting: Physical Medicine and Rehabilitation

## 2022-12-31 ENCOUNTER — Encounter
Payer: Commercial Managed Care - PPO | Attending: Physical Medicine and Rehabilitation | Admitting: Physical Medicine and Rehabilitation

## 2022-12-31 VITALS — BP 115/76 | HR 80 | Ht 73.0 in | Wt 183.0 lb

## 2022-12-31 DIAGNOSIS — R2681 Unsteadiness on feet: Secondary | ICD-10-CM

## 2022-12-31 DIAGNOSIS — M79642 Pain in left hand: Secondary | ICD-10-CM

## 2022-12-31 DIAGNOSIS — M79602 Pain in left arm: Secondary | ICD-10-CM | POA: Diagnosis not present

## 2022-12-31 DIAGNOSIS — G959 Disease of spinal cord, unspecified: Secondary | ICD-10-CM | POA: Diagnosis not present

## 2022-12-31 DIAGNOSIS — M6281 Muscle weakness (generalized): Secondary | ICD-10-CM | POA: Diagnosis not present

## 2022-12-31 DIAGNOSIS — R2689 Other abnormalities of gait and mobility: Secondary | ICD-10-CM

## 2022-12-31 DIAGNOSIS — R29818 Other symptoms and signs involving the nervous system: Secondary | ICD-10-CM | POA: Diagnosis not present

## 2022-12-31 DIAGNOSIS — M792 Neuralgia and neuritis, unspecified: Secondary | ICD-10-CM | POA: Insufficient documentation

## 2022-12-31 DIAGNOSIS — R278 Other lack of coordination: Secondary | ICD-10-CM | POA: Diagnosis not present

## 2022-12-31 DIAGNOSIS — R208 Other disturbances of skin sensation: Secondary | ICD-10-CM | POA: Diagnosis not present

## 2022-12-31 MED ORDER — GABAPENTIN 300 MG PO CAPS
300.0000 mg | ORAL_CAPSULE | Freq: Three times a day (TID) | ORAL | 5 refills | Status: DC
  Filled 2022-12-31 – 2023-01-04 (×2): qty 90, 30d supply, fill #0
  Filled 2023-03-13: qty 90, 30d supply, fill #1
  Filled 2023-04-15: qty 90, 30d supply, fill #2
  Filled 2023-06-15: qty 90, 30d supply, fill #3
  Filled 2023-08-07: qty 90, 30d supply, fill #4
  Filled 2023-09-09: qty 90, 30d supply, fill #5

## 2022-12-31 NOTE — Patient Instructions (Addendum)
Pt is a 57 year old R handed male with Cervical myelopathy and L sided weakness s/p C3-6 ACDF; myofascial pain s/p trigger point injections, L shoulder pain- prior multifocal pneumonia-  Here for hospital f/u on Cervical myelopathy.    Got headset to work around at work, so can write/take notes at work.  Suggest either dragon- or word to writing programs can get on phone- .    2.  Use Saebo for L shoulder to see if can get shoulder strength, help pain.   3. Went over has a C5 upper motor neuron issue- based on muscle atrophy and Hoffman's in LUE  4. Con't Oxy only as needed- dn't use with driving, working, etc.   5. Can get back to driving- start in back roads. But then can expand from there.    6.  Discussed recovery at length and that my only concern - of L shoulder  7. F/U in 62months- but want you to call in 1 month- or my chart to let me know where things are at.   8. Can start Muscle relaxants if gets muscle spasms/tightness- or getting stiffer- call me if needs something.  9. Con't Gabapentin  300 mg 2x/day and a third dose if needed  10. Stay off Flomax- ran out 2 days ago.   11. Senna as needed  12. Will see if things change- if not send to Urology.

## 2022-12-31 NOTE — Progress Notes (Signed)
Subjective:    Patient ID: Johnny Robinson, male    DOB: 1966/05/19, 57 y.o.   MRN: [id number]  HPI Pt is a 57 year old male with Cervical myelopathy and L sided weakness s/p C3-6 ACDF; myofascial pain s/p trigger point injections, L shoulder pain- prior multifocal pneumonia-  Here for hospital f/u on Cervical myelopathy.    Things going well- no assistive device.   Was asking when ready to be discharged from therapy- tied shoes this AM-   Can go out and walk 3 miles- did 2 weeks ago- can climb stairs as well.   All the movements there.   L shoulder "no good" and L hand working well enough can do most things want to do.   Can help with R arm- has a big divot where muscle was- in posterior shoulder- cna use R arm to get L arm up in the arm, but cnanot do active ROM of L shoulder-  Can walk L hand up the wall.   Cannot turn arm over at elbow without weight/on leg.   Feels good about the hand being able to close and do things with L hand  Using Saebo on the L shoulder- will make shoulder muscle contract-  hasn't done   As of today, off Oxycodone- last night first night went without Oxy and has some/a lot left.   Cuts them in half and seems to work.   Asking when can drive-  Office in [place]- and wife works remote.   Dr Jordan Likes says can drive now-saw him Wednesday last week.   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  Just saw result son My chart- Sees Dr Katrinka Blazing early May  Went over MRI thoracic spine- has a lot of pushing on ventral thecal sac.    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    Pain Inventory Average Pain 4 Pain Right Now 3 My pain is intermittent, dull, and aching  LOCATION OF PAIN  left shoulder  BOWEL Number of stools per week: 5 Oral laxative use Yes  Type of laxative OTC pill   BLADDER Normal   Mobility walk without assistance how many minutes can you walk? No  problem ability to climb steps?  yes do you drive?  no Do you have any goals in this area?  yes  Function what is your job? On medical leave.  Neuro/Psych No problems in this area  Prior Studies Any changes since last visit?  yes CT/MRI & Ultra Sound  Physicians involved in your care Any changes since last visit?  no   Family History  Problem Relation Age of Onset   Nephrolithiasis Mother    Cancer Father    Social History   Socioeconomic History   Marital status: Married    Spouse name: Johnny Robinson   Number of children: 3   Years of education: JD   Highest education level: Professional school degree (e.g., MD, DDS, DVM, JD)  Occupational History   Occupation: Clinical research associate  Tobacco Use   Smoking status: Never   Smokeless tobacco: Never  Vaping Use   Vaping Use: Never used  Substance and Sexual Activity   Alcohol use: Yes    Comment: occasional   Drug use: No   Sexual activity: Yes    Birth control/protection: None  Other Topics Concern   Not on file  Social History Narrative   Not  on file   Social Determinants of Health   Financial Resource Strain: Not on file  Food Insecurity: No Food Insecurity (11/13/2022)   Hunger Vital Sign    Worried About Running Out of Food in the Last Year: Never true    Ran Out of Food in the Last Year: Never true  Transportation Needs: No Transportation Needs (11/13/2022)   PRAPARE - Administrator, Civil Service (Medical): No    Lack of Transportation (Non-Medical): No  Physical Activity: Not on file  Stress: Not on file  Social Connections: Not on file   Past Surgical History:  Procedure Laterality Date   BACK SURGERY  11/2017   burr hole  1992   to evacuate SDH   COLONOSCOPY WITH PROPOFOL N/A 09/02/2018   Procedure: COLONOSCOPY WITH PROPOFOL;  Surgeon: Midge Minium, MD;  Location[facility] ENDOSCOPY;  Service: Endoscopy;  Laterality: N/A;   EXCISION MASS ABDOMINAL Left 05/28/2018   Procedure: EXCISIONAL BIOPSY MASS  ABDOMINAL WALL;  Surgeon: Carolan Shiver, MD;  Location: [facility] ORS;  Service: General;  Laterality: Left;   PERIPHERAL VASCULAR CATHETERIZATION Right 05/18/2015   Procedure: Renal Angiography;  Surgeon: Annice Needy, MD;  Location: [facility] INVASIVE CV LAB;  Service: Cardiovascular;  Laterality: Right;   PERIPHERAL VASCULAR CATHETERIZATION Bilateral 05/18/2015   Procedure: Renal Intervention;  Surgeon: Annice Needy, MD;  Location: [facility] INVASIVE CV LAB;  Service: Cardiovascular;  Laterality: Bilateral;   TOTAL HIP ARTHROPLASTY Left 03/07/2022   Procedure: TOTAL HIP ARTHROPLASTY ANTERIOR APPROACH;  Surgeon: Ollen Gross, MD;  Location: [facility] ORS;  Service: Orthopedics;  Laterality: Left;   Past Medical History:  Diagnosis Date   Arthritis    Chronic kidney disease 2016   RENAL INFARCT   GERD (gastroesophageal reflux disease)    Hypertension    Reflux    Subdural hematoma 1991   Ht 6\' 1"  (1.854 m)   Wt 183 lb (83 kg)   BMI 24.14 kg/m   Opioid Risk Score:   Fall Risk Score:  `1  Depression screen PHQ 2/9      No data to display          Review of Systems  Musculoskeletal:        Pain in left shoulder & upper arm  All other systems reviewed and are negative.      Objective:   Physical Exam Awake, alert, appropriate, accompanied by wife, NAD MS: RUE 5/5 LUE- deltoid 4-/5; biceps 4-/5; triceps 4-/5 WE 3+/5; grip 4-/5 and FA 2+/5 RLE_ 5/5 LLE- 5-5 in HF, KE, F, DF and PF  Muscle atrophy of L infraspinatus, upper traps and supraspinatus Neuro: Hoffman's (+) LUE Brisk hoffman's 2-3 beats clonus in LLE No increased tone in L side on exam Decreased sensation to light touch- C5- T1 on L arm Intact to light touch in LLE and torso        Assessment & Plan:   Pt is a 57 year old R handed male with Cervical myelopathy and L sided weakness s/p C3-6 ACDF; myofascial pain s/p trigger point injections, L shoulder pain- prior multifocal pneumonia-  Here for hospital f/u on  Cervical myelopathy.    Got headset to work around at work, so can write/take notes at work.  Suggest either dragon- or word to writing programs can get on phone- .    2.  Use Saebo for L shoulder to see if can get shoulder strength, help pain.   3. Went over has a C5  upper motor neuron issue- based on muscle atrophy and Hoffman's in LUE  4. Con't Oxy only as needed- dn't use with driving, working, etc.   5. Can get back to driving- start in back roads. But then can expand from there.    6.  Discussed recovery at length and that my only concern - of L shoulder  7. F/U in 3months- but want you to call in 1 month- or my chart to let me know where things are at.   8. Can start Muscle relaxants if gets muscle spasms/tightness- or getting stiffer- call me if needs something.   9. Con't Gabapentin  300 mg 2x/day and a third dose if needed  10. Stay off Flomax- ran out 2 days ago.   11. Senna as needed  12. Will see if things change- if not, send to Urology.   I spent a total of 42   minutes on total care today- >50% coordination of care- due to diagnosis, d/w pt and wife about recovery-

## 2022-12-31 NOTE — Therapy (Signed)
OUTPATIENT OCCUPATIONAL THERAPY NEURO Treatment Session  Patient Name: Johnny Robinson MRN: [id number] DOB:07-Mar-1966, 57 y.o., male Today's Date: 12/31/2022  PCP: Marguarite Arbour, MD REFERRING PROVIDER: Jacquelynn Cree, PA-C  END OF SESSION:  OT End of Session - 12/31/22 1408     Visit Number 6    Number of Visits 17    Date for OT Re-Evaluation 02/01/23    Authorization Type [employer] employee - [employer] PPO    OT Start Time [phone]    OT Stop Time 1444    OT Time Calculation (min) 42 min                  Past Medical History:  Diagnosis Date   Arthritis    Chronic kidney disease 2016   RENAL INFARCT   GERD (gastroesophageal reflux disease)    Hypertension    Reflux    Subdural hematoma 1991   Past Surgical History:  Procedure Laterality Date   BACK SURGERY  11/2017   burr hole  1992   to evacuate SDH   COLONOSCOPY WITH PROPOFOL N/A 09/02/2018   Procedure: COLONOSCOPY WITH PROPOFOL;  Surgeon: Midge Minium, MD;  Location: [facility] ENDOSCOPY;  Service: Endoscopy;  Laterality: N/A;   EXCISION MASS ABDOMINAL Left 05/28/2018   Procedure: EXCISIONAL BIOPSY MASS ABDOMINAL WALL;  Surgeon: Carolan Shiver, MD;  Location: [facility] ORS;  Service: General;  Laterality: Left;   PERIPHERAL VASCULAR CATHETERIZATION Right 05/18/2015   Procedure: Renal Angiography;  Surgeon: Annice Needy, MD;  Location: [facility] INVASIVE CV LAB;  Service: Cardiovascular;  Laterality: Right;   PERIPHERAL VASCULAR CATHETERIZATION Bilateral 05/18/2015   Procedure: Renal Intervention;  Surgeon: Annice Needy, MD;  Location: [facility] INVASIVE CV LAB;  Service: Cardiovascular;  Laterality: Bilateral;   TOTAL HIP ARTHROPLASTY Left 03/07/2022   Procedure: TOTAL HIP ARTHROPLASTY ANTERIOR APPROACH;  Surgeon: Ollen Gross, MD;  Location: [facility] ORS;  Service: Orthopedics;  Laterality: Left;   Patient Active Problem List   Diagnosis Date Noted   Nerve pain 12/31/2022   Left shoulder pain 11/30/2022   Urinary hesitancy  11/30/2022   Constipation 11/30/2022   Biceps tendinitis of left shoulder 11/30/2022   Multifocal pneumonia 11/30/2022   Chronic pain syndrome 11/21/2022   HNP (herniated nucleus pulposus) with myelopathy, cervical 11/15/2022   Cervical myelopathy 11/13/2022   OA (osteoarthritis) of hip 03/07/2022   Primary osteoarthritis of left hip 03/07/2022   GERD (gastroesophageal reflux disease) 11/27/2021   Encounter for screening colonoscopy    Benign neoplasm of rectosigmoid junction    Hyperlipidemia 12/18/2017   Essential hypertension 12/17/2016   Chest pain 05/30/2015   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  Pt reports being able to tie shoes today which surprised him.   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 Dr. Jordan Likes expressed concerns of cord contusion due to worsening of myelopathy and MRI spine done which showed post op changes form ACDF C3-C6 and patchy signal abnormality involving the central and left cord at C5/6 concerning for contusion/injury.  PRECAUTIONS: Cervical and Other: lifting precautions not > 5#  WEIGHT BEARING RESTRICTIONS: No  PAIN:  Are you having pain? Yes: NPRS scale:  3/10 Pain location: L shoulder Pain description: constant Aggravating factors: not staying on schedule pain pills Relieving factors: taking pain pills  FALLS: Has patient fallen in last 6 months? No  LIVING ENVIRONMENT: Lives with: lives with their spouse and 83 yo Lives in: House/apartment Stairs: Yes: Internal: full flight steps; on right going up and External: 4 steps; none Has following equipment at home: Quad cane small base, Walker - 2 wheeled, shower chair, bed side commode, and hand held shower head  PLOF: Independent, Independent with basic ADLs, and Vocation/Vocational requirements: practices law  PATIENT GOALS: pt would like to get arm progressing like leg   OBJECTIVE:   HAND DOMINANCE: Right  ADLs: Overall ADLs: requires increased time and thought to complete Transfers/ambulation related to ADLs: Mod I with SBQC Eating: requires assistance to cut food Grooming: Mod I UB Dressing: requiring assistance with buttons LB Dressing: requiring assistance with socks/tying shoes Toileting: Mod I Bathing: Mod I Tub Shower transfers: Mod I, has built in shower seat Equipment: Walk in shower  IADLs: Light housekeeping: has attempted vacuuming without issue Meal Prep: has attempted cooking, spouse or children assisting with retrieving items if >5# Medication management: spouse is filling pillbox and is reminding him to take  MOBILITY STATUS: Needs Assist: Mod I - Supervision with SBQC  POSTURE COMMENTS:  rounded shoulders and forward head  FUNCTIONAL OUTCOME MEASURES: Upper Extremity Functional Scale (UEFS): 33/80 =  41.25% function  UPPER EXTREMITY ROM:    Active ROM Right eval Left eval  Shoulder flexion  88 (requiring abduction to achieve)  Shoulder abduction  85  Shoulder adduction    Shoulder extension    Shoulder internal rotation  90%  Shoulder external rotation  30%  Elbow flexion  100  Elbow extension  WNL  Wrist flexion  WNL  Wrist extension  To  neutral  Wrist ulnar deviation  trace  Wrist radial deviation  trace  Wrist pronation    Wrist supination  To neutral  (Blank rows = not tested)  UPPER EXTREMITY MMT:     MMT Right eval Left eval  Shoulder flexion  2-/5  Shoulder abduction    Shoulder adduction    Shoulder extension    Shoulder internal rotation    Shoulder external rotation    Middle trapezius    Lower trapezius    Elbow flexion  4-/5  Elbow extension  4-/5  Wrist flexion  3-/5  Wrist extension  3-/5  Wrist ulnar deviation    Wrist radial deviation    Wrist pronation    Wrist supination    (Blank rows = not tested)  HAND FUNCTION: Loose gross grasp, unable to assess strength on dynamometer  COORDINATION: Finger Nose Finger test: able to reach chin to finger with LUE, decreased shoulder ROM limiting movement 9 Hole Peg test: Right: 23.03 sec; Left: able to place 5 pegs in 2 min time limit (pt needing to use side of container/multiple pegs to leverage to pick up one peg, also with difficulty with manipulation/translation in finger tips to orient to place in peg  hole) Box and Blocks:  Right 44 blocks, Left 32 blocks  SENSATION: Reports duller sensation on L compared to R  COGNITION: Overall cognitive status: Within functional limits for tasks assessed  VISION: Subjective report: no changes Baseline vision: Wears glasses for reading only   TODAY'S TREATMENT:                                                                                       12/31/22 Connect 4:  picking up and placing pieces into Connect 4 grid with focus on shoulder flexion in conjunction with grasp and coordination.  Pt demonstrating min difficulty with shoulder flexion to place into grid, but able to complete with increased effort.  OT increased challenge to picking up 5 pieces in palm and translation palm to fingers to place into grid. Pt with increased challenge and dropping of pieces during in-hand manipulation. OT further  modified task to changing to placing coins into coin slot to further challenge wrist extension and ulnar deviation as pt previously using gravity to aid in placing into Connect 4 grid.  Discussed functional carryover to office tasks and home tasks. 9 hole peg test: 1:06.84 sec.  Pt demonstrating significant improvements, however still demonstrating gross grasp and use of positioning of UE to aid in manipulation/coordination. Placing items into cabinet at shoulder and mod-high range with focus on use of BUE together to incresae ROM and tolerance of LUE with movement.  Discussed NMR with use of BUE together for continued focus on increased functional ROM and strengthening.   12/26/22 Saebo glide:  Shoulder flexion to ~100*, 2 sets of 10 Shoulder abduction at 45* angle, 2 sets of 10 UE ROM/strengthening Chest press with 1# medicine ball with BUE for strengthening and symmetry and pt with inability to complete with single LUE. PNF pattern diagonals with 1# and then 2# medicine ball with good technique, utilizing BUE for technique and increased facilitation of LUE. Wall pushups: completed x8 and x5 with focus on hand placement and positioning.  OT educating on modifications to hand and feet placement to increase and decrease challenge. Supine shoulder ROM/strengthening Engaged in shoulder flexion with pt able to achieve ~150* in supine with no reports of pain Single arm chest press and BUE chest press with improved motor control of LUE Completed shoulder flexion with BUE while holding 1# medicine ball for symmetry and increased strengthening.  Discussed functional positioning of UE during each ROM to facilitate increased movement without compensatory movements.   12/24/22 Wall slides: engaged in wall slides with LUE with focus on ROM within pain tolerance, modified task to utilization with ball with use of BUE to attempt to increase ROM.  Pt completed with ability to increase shoulder ROM without  increased pain when utilizing RUE over LUE at end range. Pegs: engaged in coordination activity with medium and small sized pegs with focus on placing, rotating, and in-hand manipulation to challenge pinch and coordination.  OT increased challenge to incorporate reaching across midline - pt with reports of increased difficulty and min increase in pain with reaching across midline.  OT also encouraging pt to pick up pegs in functional position, as pt frequently  over pronating to pick up pegs.  Pt demonstrating difficulty with in-hand manipulation due to decreased sustained grasp, frequently dropping pegs especially as hand fatigues.   PATIENT EDUCATION: Education details: ongoing condition specific education and UE ROM and strengthening exercises Person educated: Patient Education method: Explanation, Demonstration, Verbal cues, and Handouts Education comprehension: verbalized understanding and needs further education  HOME EXERCISE PROGRAM: Access Code: [email or web address] URL: [email or web address] Date: 12/20/2022 Prepared by: [facility] - Outpatient  Rehab - [facility]  Exercises - [facility]  - 1-2 x daily - 2 sets - 10 reps - Open Book Chest Rotation Stretch on Foam 1/2 Roll  - 1-2 x daily - 2 sets - 10 reps - Shoulder PNF D2 Flexion  - 1-2 x daily - 2 sets - 10 reps - Shoulder Flexion Wall Slide with Towel  - 1-2 x daily - 2 sets - 10 reps - Forearm Supination with Dumbbell  - 1 x daily - 2 sets - 10 reps - Seated Wrist Flexion with Dumbbell  - 1 x daily - 2 sets - 1 reps - Seated Single Arm Bicep Curls Supinated with Dumbbell  - 1 x daily - 2 sets - 10 reps   GOALS: Goals reviewed with patient? Yes  SHORT TERM GOALS: Target date: 01/04/23  Pt will be independent with coordination and ROM HEP. Baseline: Goal status: MET - 12/31/22  2.  Pt will demonstrate increased coordination to be able to place all 9 pegs into peg board during 9 hole peg test in 2 min time  limit. Baseline: able to place 5 in 2 min time limit Goal status: MET - 1:06.84 on 12/31/22  3.  Pt will demonstrate increased L shoulder ROM to allow for ability to retreive light weight object from shoulder height cabinet. Baseline: 88* shoulder flexion with abduction Goal status: IN PROGRESS  4.  Pt will demonstrate improved pinch and coordination as needed to complete clothing fasteners (buttons, tying shoes, etc). Baseline:  Goal status: IN PROGRESS   LONG TERM GOALS: Target date: 02/01/23  Pt will be independent with advanced HEP. Baseline:  Goal status: IN PROGRESS  2.  Pt will demonstrate improved UE functional use for ADLs as evidenced by increasing box/ blocks score by 6 blocks with LUE Baseline: Right 44 blocks, Left 32 blocks Goal status: IN PROGRESS  3.  Pt will demonstrate improved fine motor coordination for ADLs as evidenced by being able to complete 9 hole peg test (place and remove) in 2 min time limit Baseline: able to place 5 in 2 min time limit Goal status: MET - 1:06.84 on 12/31/22  4.  Pt will demonstrate increased L shoulder ROM and strength to tolerate holding phone to ear for 5 mins phone conversation. Baseline:  Goal status: IN PROGRESS  5.  Pt will report improved LUE functional use as evidenced by increased score on UEFS to > 60%. Baseline: 41.25% Goal status: IN PROGRESS    ASSESSMENT:  CLINICAL IMPRESSION: Pt demonstrating good awareness and understanding of task modifications this session to increase challenge and awareness of hand placement/positioning during functional and structured tasks.  Pt demonstrating improved coordination and motor control, even with functional reach to incorporate shoulder flexion against gravity.  Pt utilizing BUE with increased overhead reaching and BUE exercises to facilitate increased ROM.  PERFORMANCE DEFICITS: in functional skills including ADLs, IADLs, coordination, dexterity, sensation, ROM, strength, pain,  Fine motor control, Gross motor control, balance, body mechanics, endurance, decreased knowledge of precautions, decreased knowledge of  use of DME, and UE functional use and psychosocial skills including coping strategies and environmental adaptation.   IMPAIRMENTS: are limiting patient from ADLs, IADLs, rest and sleep, and work.   CO-MORBIDITIES: may have co-morbidities  that affects occupational performance. Patient will benefit from skilled OT to address above impairments and improve overall function.  MODIFICATION OR ASSISTANCE TO COMPLETE EVALUATION: Min-Moderate modification of tasks or assist with assess necessary to complete an evaluation.  OT OCCUPATIONAL PROFILE AND HISTORY: Detailed assessment: Review of records and additional review of physical, cognitive, psychosocial history related to current functional performance.  CLINICAL DECISION MAKING: LOW - limited treatment options, no task modification necessary  REHAB POTENTIAL: Good  EVALUATION COMPLEXITY: Moderate    PLAN:  OT FREQUENCY: 2x/week  OT DURATION: 8 weeks  PLANNED INTERVENTIONS: self care/ADL training, therapeutic exercise, therapeutic activity, neuromuscular re-education, manual therapy, passive range of motion, balance training, functional mobility training, electrical stimulation, ultrasound, moist heat, cryotherapy, patient/family education, psychosocial skills training, energy conservation, coping strategies training, and DME and/or AE instructions  RECOMMENDED OTHER SERVICES: NA  CONSULTED AND AGREED WITH PLAN OF CARE: Patient  PLAN FOR NEXT SESSION: Review and add to shoulder ROM as appropriate, Initiate FMC tasks and HEP, may be able to engage in resistance bands and/or other strengthening for shoulder   Thao Vanover, OTR/L 12/31/2022, 2:08 PM

## 2023-01-01 ENCOUNTER — Other Ambulatory Visit (HOSPITAL_BASED_OUTPATIENT_CLINIC_OR_DEPARTMENT_OTHER): Payer: Self-pay

## 2023-01-02 ENCOUNTER — Other Ambulatory Visit: Payer: Self-pay

## 2023-01-02 ENCOUNTER — Other Ambulatory Visit (HOSPITAL_COMMUNITY): Payer: Self-pay

## 2023-01-02 ENCOUNTER — Ambulatory Visit: Payer: Commercial Managed Care - PPO

## 2023-01-02 ENCOUNTER — Ambulatory Visit: Payer: Commercial Managed Care - PPO | Admitting: Occupational Therapy

## 2023-01-02 ENCOUNTER — Other Ambulatory Visit: Payer: Self-pay | Admitting: Physical Medicine and Rehabilitation

## 2023-01-02 DIAGNOSIS — R278 Other lack of coordination: Secondary | ICD-10-CM

## 2023-01-02 DIAGNOSIS — G959 Disease of spinal cord, unspecified: Secondary | ICD-10-CM | POA: Diagnosis not present

## 2023-01-02 DIAGNOSIS — R2689 Other abnormalities of gait and mobility: Secondary | ICD-10-CM

## 2023-01-02 DIAGNOSIS — M79642 Pain in left hand: Secondary | ICD-10-CM

## 2023-01-02 DIAGNOSIS — R2681 Unsteadiness on feet: Secondary | ICD-10-CM

## 2023-01-02 DIAGNOSIS — M6281 Muscle weakness (generalized): Secondary | ICD-10-CM

## 2023-01-02 DIAGNOSIS — R29818 Other symptoms and signs involving the nervous system: Secondary | ICD-10-CM | POA: Diagnosis not present

## 2023-01-02 DIAGNOSIS — R208 Other disturbances of skin sensation: Secondary | ICD-10-CM | POA: Diagnosis not present

## 2023-01-02 DIAGNOSIS — M79602 Pain in left arm: Secondary | ICD-10-CM | POA: Diagnosis not present

## 2023-01-02 MED ORDER — SENNOSIDES-DOCUSATE SODIUM 8.6-50 MG PO TABS
2.0000 | ORAL_TABLET | Freq: Every day | ORAL | 0 refills | Status: DC
  Filled 2023-01-02: qty 60, 30d supply, fill #0

## 2023-01-02 MED ORDER — TAMSULOSIN HCL 0.4 MG PO CAPS
0.4000 mg | ORAL_CAPSULE | Freq: Every day | ORAL | 0 refills | Status: DC
  Filled 2023-01-02: qty 30, 30d supply, fill #0

## 2023-01-02 NOTE — Therapy (Signed)
OUTPATIENT OCCUPATIONAL THERAPY NEURO Treatment Session  Patient Name: Johnny Robinson MRN: [id number] DOB:12-Jul-1966, 57 y.o., male Today's Date: 01/02/2023  PCP: Marguarite Arbour, MD REFERRING PROVIDER: Jacquelynn Cree, PA-C  END OF SESSION:  OT End of Session - 01/02/23 0945     Visit Number 7    Number of Visits 17    Date for OT Re-Evaluation 02/01/23    Authorization Type [employer] employee - [employer] P[employer]    OT Start Time [phone]    OT Stop Time 1014    OT Time Calculation (min) 42 min                   Past Medical History:  Diagnosis Date   Arthritis    Chronic kidney disease 2016   RENAL INFARCT   GERD (gastroesophageal reflux disease)    Hypertension    Reflux    Subdural hematoma 1991   Past Surgical History:  Procedure Laterality Date   BACK SURGERY  11/2017   burr hole  1992   to evacuate SDH   COLONOSCOPY WITH PROPOFOL N/A 09/02/2018   Procedure: COLONOSCOPY WITH PROPOFOL;  Surgeon: Midge Minium, MD;  Location: [facility] ENDOSCOPY;  Service: Endoscopy;  Laterality: N/A;   EXCISION MASS ABDOMINAL Left 05/28/2018   Procedure: EXCISIONAL BIOPSY MASS ABDOMINAL WALL;  Surgeon: Carolan Shiver, MD;  Location: [facility] ORS;  Service: General;  Laterality: Left;   PERIPHERAL VASCULAR CATHETERIZATION Right 05/18/2015   Procedure: Renal Angiography;  Surgeon: Annice Needy, MD;  Location: [facility] INVASIVE CV LAB;  Service: Cardiovascular;  Laterality: Right;   PERIPHERAL VASCULAR CATHETERIZATION Bilateral 05/18/2015   Procedure: Renal Intervention;  Surgeon: Annice Needy, MD;  Location: [facility] INVASIVE CV LAB;  Service: Cardiovascular;  Laterality: Bilateral;   TOTAL HIP ARTHROPLASTY Left 03/07/2022   Procedure: TOTAL HIP ARTHROPLASTY ANTERIOR APPROACH;  Surgeon: Ollen Gross, MD;  Location: [facility] ORS;  Service: Orthopedics;  Laterality: Left;   Patient Active Problem List   Diagnosis Date Noted   Nerve pain 12/31/2022   Left shoulder pain 11/30/2022   Urinary hesitancy  11/30/2022   Constipation 11/30/2022   Biceps tendinitis of left shoulder 11/30/2022   Multifocal pneumonia 11/30/2022   Chronic pain syndrome 11/21/2022   HNP (herniated nucleus pulposus) with myelopathy, cervical 11/15/2022   Cervical myelopathy 11/13/2022   OA (osteoarthritis) of hip 03/07/2022   Primary osteoarthritis of left hip 03/07/2022   GERD (gastroesophageal reflux disease) 11/27/2021   Encounter for screening colonoscopy    Benign neoplasm of rectosigmoid junction    Hyperlipidemia 12/18/2017   Essential hypertension 12/17/2016   Chest pain 05/30/2015   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 Dr. Jordan Likes expressed concerns of cord  contusion due to worsening of myelopathy and MRI spine done which showed post op changes form ACDF C3-C6 and patchy signal abnormality involving the central and left cord at C5/6 concerning for contusion/injury.  PRECAUTIONS: Cervical and Other: lifting precautions not > 5#  WEIGHT BEARING RESTRICTIONS: No  PAIN:  Are you having pain? No  FALLS: Has patient fallen in last 6 months? No  LIVING  ENVIRONMENT: Lives with: lives with their spouse and 23 yo Lives in: House/apartment Stairs: Yes: Internal: full flight steps; on right going up and External: 4 steps; none Has following equipment at home: Quad cane small base, Walker - 2 wheeled, shower chair, bed side commode, and hand held shower head  PLOF: Independent, Independent with basic ADLs, and Vocation/Vocational requirements: practices law  PATIENT GOALS: pt would like to get arm progressing like leg   OBJECTIVE:   HAND DOMINANCE: Right  ADLs: Overall ADLs: requires increased time and thought to complete Transfers/ambulation related to ADLs: Mod I with SBQC Eating: requires assistance to cut food Grooming: Mod I UB Dressing: requiring assistance with buttons LB Dressing: requiring assistance with socks/tying shoes Toileting: Mod I Bathing: Mod I Tub Shower transfers: Mod I, has built in shower seat Equipment: Walk in shower  IADLs: Light housekeeping: has attempted vacuuming without issue Meal Prep: has attempted cooking, spouse or children assisting with retrieving items if >5# Medication management: spouse is filling pillbox and is reminding him to take  MOBILITY STATUS: Needs Assist: Mod I - Supervision with SBQC  POSTURE COMMENTS:  rounded shoulders and forward head  FUNCTIONAL OUTCOME MEASURES: Upper Extremity Functional Scale (UEFS): 33/80 =  41.25% function  UPPER EXTREMITY ROM:    Active ROM Right eval Left eval  Shoulder flexion  88 (requiring abduction to achieve)  Shoulder abduction  85  Shoulder adduction    Shoulder extension    Shoulder internal rotation  90%  Shoulder external rotation  30%  Elbow flexion  100  Elbow extension  WNL  Wrist flexion  WNL  Wrist extension  To neutral  Wrist ulnar deviation  trace  Wrist radial deviation  trace  Wrist pronation    Wrist supination  To neutral  (Blank rows = not tested)  UPPER EXTREMITY MMT:     MMT Right eval Left eval  Shoulder  flexion  2-/5  Shoulder abduction    Shoulder adduction    Shoulder extension    Shoulder internal rotation    Shoulder external rotation    Middle trapezius    Lower trapezius    Elbow flexion  4-/5  Elbow extension  4-/5  Wrist flexion  3-/5  Wrist extension  3-/5  Wrist ulnar deviation    Wrist radial deviation    Wrist pronation    Wrist supination    (Blank rows = not tested)  HAND FUNCTION: Loose gross grasp, unable to assess strength on dynamometer  COORDINATION: Finger Nose Finger test: able to reach chin to finger with LUE, decreased shoulder ROM limiting movement 9 Hole Peg test: Right: 23.03 sec; Left: able to place 5 pegs in 2 min time limit (pt needing to use side of container/multiple pegs to leverage to pick up one peg, also with difficulty with manipulation/translation in finger tips to orient to place in peg hole) Box and Blocks:  Right 44 blocks, Left 32 blocks  SENSATION: Reports duller sensation on L compared to R  COGNITION: Overall cognitive status: Within functional limits for tasks assessed  VISION: Subjective report: no  changes Baseline vision: Wears glasses for reading only   TODAY'S TREATMENT:                                                                                       01/02/23 Resistance Clothespins: 2, 4# with LUE for mid functional reaching and sustained pinch. Pt req'd min cues and demonstration for movement pattern to challenge alternating grips and forearm rotation.  Pt demonstrating improved success with key grip > 3 point pinch.  Pt able to achieve shoulder flexion to ~90* during reaching with increased challenge to place on vertical dowel. Increase focus placed on 3 point pinch with repetition to challenge pinch and small functional reach. Dynamic reaching: engaged in reaching with LUE into cabinet at mod height with focus on increased shoulder flexion.  Pt able to reach ~100* to place and remove cups with LUE.  Pt demonstrating  increased effort with increased ROM, but able to complete without assist from other UE. Coordination: engaged in picking up small sticks with focus on [facility] and pinch to pick up with focus on precision and motor control.  Then placing stick through slots with focus on [facility] with pinch and wrist movements to challenge motor control with incorporation of shoulder ROM.      12/31/22 Connect 4:  picking up and placing pieces into Connect 4 grid with focus on shoulder flexion in conjunction with grasp and coordination.  Pt demonstrating min difficulty with shoulder flexion to place into grid, but able to complete with increased effort.  OT increased challenge to picking up 5 pieces in palm and translation palm to fingers to place into grid. Pt with increased challenge and dropping of pieces during in-hand manipulation. OT further modified task to changing to placing coins into coin slot to further challenge wrist extension and ulnar deviation as pt previously using gravity to aid in placing into Connect 4 grid.  Discussed functional carryover to office tasks and home tasks. 9 hole peg test: 1:06.84 sec.  Pt demonstrating significant improvements, however still demonstrating gross grasp and use of positioning of UE to aid in manipulation/coordination. Placing items into cabinet at shoulder and mod-high range with focus on use of BUE together to incresae ROM and tolerance of LUE with movement.  Discussed NMR with use of BUE together for continued focus on increased functional ROM and strengthening.   12/26/22 Saebo glide:  Shoulder flexion to ~100*, 2 sets of 10 Shoulder abduction at 45* angle, 2 sets of 10 UE ROM/strengthening Chest press with 1# medicine ball with BUE for strengthening and symmetry and pt with inability to complete with single LUE. PNF pattern diagonals with 1# and then 2# medicine ball with good technique, utilizing BUE for technique and increased facilitation of LUE. Wall pushups: completed  x8 and x5 with focus on hand placement and positioning.  OT educating on modifications to hand and feet placement to increase and decrease challenge. Supine shoulder ROM/strengthening Engaged in shoulder flexion with pt able to achieve ~150* in supine with no reports of pain Single arm chest press and BUE chest press with improved motor control of LUE Completed shoulder flexion with BUE while holding 1#  medicine ball for symmetry and increased strengthening.  Discussed functional positioning of UE during each ROM to facilitate increased movement without compensatory movements.    PATIENT EDUCATION: Education details: ongoing condition specific education and UE ROM and strengthening exercises Person educated: Patient Education method: Explanation, Demonstration, Verbal cues, and Handouts Education comprehension: verbalized understanding and needs further education  HOME EXERCISE PROGRAM: Access Code: [email or web address] URL: [email or web address] Date: 12/20/2022 Prepared by: [facility] - Outpatient  Rehab - [facility]  Exercises - [facility]  - 1-2 x daily - 2 sets - 10 reps - Open Book Chest Rotation Stretch on Foam 1/2 Roll  - 1-2 x daily - 2 sets - 10 reps - Shoulder PNF D2 Flexion  - 1-2 x daily - 2 sets - 10 reps - Shoulder Flexion Wall Slide with Towel  - 1-2 x daily - 2 sets - 10 reps - Forearm Supination with Dumbbell  - 1 x daily - 2 sets - 10 reps - Seated Wrist Flexion with Dumbbell  - 1 x daily - 2 sets - 1 reps - Seated Single Arm Bicep Curls Supinated with Dumbbell  - 1 x daily - 2 sets - 10 reps   GOALS: Goals reviewed with patient? Yes  SHORT TERM GOALS: Target date: 01/04/23  Pt will be independent with coordination and ROM HEP. Baseline: Goal status: MET - 12/31/22  2.  Pt will demonstrate increased coordination to be able to place all 9 pegs into peg board during 9 hole peg test in 2 min time limit. Baseline: able to place 5 in 2 min time limit Goal  status: MET - 1:06.84 on 12/31/22  3.  Pt will demonstrate increased L shoulder ROM to allow for ability to retreive light weight object from shoulder height cabinet. Baseline: 88* shoulder flexion with abduction Goal status: IN PROGRESS  4.  Pt will demonstrate improved pinch and coordination as needed to complete clothing fasteners (buttons, tying shoes, etc). Baseline:  Goal status: MET - 01/02/23   LONG TERM GOALS: Target date: 02/01/23  Pt will be independent with advanced HEP. Baseline:  Goal status: IN PROGRESS  2.  Pt will demonstrate improved UE functional use for ADLs as evidenced by increasing box/ blocks score by 6 blocks with LUE Baseline: Right 44 blocks, Left 32 blocks Goal status: IN PROGRESS  3.  Pt will demonstrate improved fine motor coordination for ADLs as evidenced by being able to complete 9 hole peg test (place and remove) in 2 min time limit Baseline: able to place 5 in 2 min time limit Goal status: MET - 1:06.84 on 12/31/22  4.  Pt will demonstrate increased L shoulder ROM and strength to tolerate holding phone to ear for 5 mins phone conversation. Baseline:  Goal status: IN PROGRESS  5.  Pt will report improved LUE functional use as evidenced by increased score on UEFS to > 60%. Baseline: 41.25% Goal status: IN PROGRESS    ASSESSMENT:  CLINICAL IMPRESSION: Pt demonstrating improved coordination and motor control, even with functional reach to incorporate shoulder flexion against gravity.  Pt able to achieve > 90* shoulder flexion with LUE during reaching task and demonstrating improved quality of movement during pinch tasks with resistive clothespins.  PERFORMANCE DEFICITS: in functional skills including ADLs, IADLs, coordination, dexterity, sensation, ROM, strength, pain, Fine motor control, Gross motor control, balance, body mechanics, endurance, decreased knowledge of precautions, decreased knowledge of use of DME, and UE functional use and  psychosocial skills including coping strategies and  environmental adaptation.   IMPAIRMENTS: are limiting patient from ADLs, IADLs, rest and sleep, and work.   CO-MORBIDITIES: may have co-morbidities  that affects occupational performance. Patient will benefit from skilled OT to address above impairments and improve overall function.  MODIFICATION OR ASSISTANCE TO COMPLETE EVALUATION: Min-Moderate modification of tasks or assist with assess necessary to complete an evaluation.  OT OCCUPATIONAL PROFILE AND HISTORY: Detailed assessment: Review of records and additional review of physical, cognitive, psychosocial history related to current functional performance.  CLINICAL DECISION MAKING: LOW - limited treatment options, no task modification necessary  REHAB POTENTIAL: Good  EVALUATION COMPLEXITY: Moderate    PLAN:  OT FREQUENCY: 2x/week  OT DURATION: 8 weeks  PLANNED INTERVENTIONS: self care/ADL training, therapeutic exercise, therapeutic activity, neuromuscular re-education, manual therapy, passive range of motion, balance training, functional mobility training, electrical stimulation, ultrasound, moist heat, cryotherapy, patient/family education, psychosocial skills training, energy conservation, coping strategies training, and DME and/or AE instructions  RECOMMENDED OTHER SERVICES: NA  CONSULTED AND AGREED WITH PLAN OF CARE: Patient  PLAN FOR NEXT SESSION: Review and add to shoulder ROM as appropriate, Initiate FMC tasks and HEP, may be able to engage in resistance bands and/or other strengthening for shoulder   Cyndal Kasson, OTR/L 01/02/2023, 9:45 AM

## 2023-01-02 NOTE — Therapy (Signed)
OUTPATIENT PHYSICAL THERAPY NEURO TREATMENT   Patient Name: Johnny Robinson MRN: [id number] DOB:09-21-1965, 57 y.o., male Today's Date: 01/02/2023   PCP:  Marguarite Arbour, MD   REFERRING PROVIDER: Jacquelynn Cree, PA-C  END OF SESSION:  PT End of Session - 01/02/23 0847     Visit Number 7    Number of Visits 13    Date for PT Re-Evaluation 01/17/23    Authorization Type [facility] [employer]    Authorization - Visit Number 7    Authorization - Number of Visits 25    PT Start Time 0845    PT Stop Time 0930    PT Time Calculation (min) 45 min    Equipment Utilized During Treatment Gait belt    Activity Tolerance Patient tolerated treatment well    Behavior During Therapy [facility] for tasks assessed/performed                Past Medical History:  Diagnosis Date   Arthritis    Chronic kidney disease 2016   RENAL INFARCT   GERD (gastroesophageal reflux disease)    Hypertension    Reflux    Subdural hematoma 1991   Past Surgical History:  Procedure Laterality Date   BACK SURGERY  11/2017   burr hole  1992   to evacuate SDH   COLONOSCOPY WITH PROPOFOL N/A 09/02/2018   Procedure: COLONOSCOPY WITH PROPOFOL;  Surgeon: Midge Minium, MD;  Location: [facility] ENDOSCOPY;  Service: Endoscopy;  Laterality: N/A;   EXCISION MASS ABDOMINAL Left 05/28/2018   Procedure: EXCISIONAL BIOPSY MASS ABDOMINAL WALL;  Surgeon: Carolan Shiver, MD;  Location: [facility] ORS;  Service: General;  Laterality: Left;   PERIPHERAL VASCULAR CATHETERIZATION Right 05/18/2015   Procedure: Renal Angiography;  Surgeon: Annice Needy, MD;  Location: [facility] INVASIVE CV LAB;  Service: Cardiovascular;  Laterality: Right;   PERIPHERAL VASCULAR CATHETERIZATION Bilateral 05/18/2015   Procedure: Renal Intervention;  Surgeon: Annice Needy, MD;  Location: [facility] INVASIVE CV LAB;  Service: Cardiovascular;  Laterality: Bilateral;   TOTAL HIP ARTHROPLASTY Left 03/07/2022   Procedure: TOTAL HIP ARTHROPLASTY ANTERIOR APPROACH;  Surgeon:  Ollen Gross, MD;  Location: [facility] ORS;  Service: Orthopedics;  Laterality: Left;   Patient Active Problem List   Diagnosis Date Noted   Nerve pain 12/31/2022   Left shoulder pain 11/30/2022   Urinary hesitancy 11/30/2022   Constipation 11/30/2022   Biceps tendinitis of left shoulder 11/30/2022   Multifocal pneumonia 11/30/2022   Chronic pain syndrome 11/21/2022   HNP (herniated nucleus pulposus) with myelopathy, cervical 11/15/2022   Cervical myelopathy 11/13/2022   OA (osteoarthritis) of hip 03/07/2022   Primary osteoarthritis of left hip 03/07/2022   GERD (gastroesophageal reflux disease) 11/27/2021   Encounter for screening colonoscopy    Benign neoplasm of rectosigmoid junction    Hyperlipidemia 12/18/2017   Essential hypertension 12/17/2016   Chest pain 05/30/2015   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   Renal infarct 05/16/2015    ONSET DATE: 11/13/22  REFERRING DIAG: G95.9 (ICD-10-CM) - Cervical myelopathy (HCC)  THERAPY DIAG:  Muscle weakness (generalized)  Unsteadiness on feet  Other abnormalities of gait and mobility  Other symptoms and signs involving the nervous system  Cervical myelopathy  Rationale for Evaluation and Treatment: Rehabilitation  SUBJECTIVE:  SUBJECTIVE STATEMENT: Continuing to progress with LUE, legs are feeling better, just weak  Pt accompanied by: self  PERTINENT HISTORY: CKD, GERD, HTN, subdural hematoma 1991, back surgery 2019, L THA 2023  PAIN:  Are you having pain? Yes: NPRS scale: 3/10 Pain location: L arm Pain description: ache Aggravating factors: raising arm Relieving factors: meds  PRECAUTIONS: Cervical and Fall  WEIGHT BEARING RESTRICTIONS: No  FALLS: Has patient fallen in last 6 months? No  LIVING ENVIRONMENT: Lives with: lives  with their spouse Lives in: House/apartment Stairs:  4 steps to enter without handrail; 3 story home  Has following equipment at home: Counselling psychologist, Environmental consultant - 2 wheeled, Tour manager, and Grab bars  PLOF: Independent, Vocation/Vocational requirements: standing, walking, some desk work, and Leisure: tennis (has not done this since THA 2023)  PATIENT GOALS: return to tennis and fishing   OBJECTIVE:   TODAY'S TREATMENT: 01/02/23 Activity Comments  NU-step resistance intervals x 8 min 30 sec heavy/slow; 60 sec light/fast  Resisted walking 2x2 min, 20# Retro to forwrad Forward to retro   Sit-stand to alt stair tap 3x10 5#, 8" step  Standing PRE 5# 2x10 -Hip flexion -hip abd -hamstring curls  Table top planks On bosu x 2 min  Push-up planks on bosu 3x15 sec, blocking left elbow to ensure extension  Retrowalk x 2 min       TODAY'S TREATMENT: 12/31/22 Activity Comments  TM 1.7 MPH x 5 min  Cues for wider BOS and heel toe pattern particularly on L side; with and without B UE support  backwards walking + side to side ball toss Cueing for wider BOS  tandem walk + side to side ball toss Good stability but difficulty catching/throwing with L  walking on heels/toes 4x 22ft L heel unable to maintain heel raise; narrow BOS  Alt heel raise 20x  Good ability   wall squats with red TB pball 10x, with with L foot back 10x  Good alignment  Sidelying clam red TB 10x each Much more challenging on L  Modified side plank lift offs laying on R side 10x    bird dog 10x Improved hip stability today but hip strop still evident   modified plank on forearms/knees Able to perform with c/o L UE "shaking" but no pain  Tall plank 2x30" Manual assist to maintain L elbow straight; very difficult       HOME EXERCISE PROGRAM Last updated: 12/31/22 Access Code: [email or web address] URL: [email or web address] Date: 12/31/2022 Prepared by: [facility] - Outpatient  Rehab - [facility]  Exercises -  Seated Hamstring Curl with Anchored Resistance  - 1 x daily - 5 x weekly - 2 sets - 10 reps - Marching with Resistance  - 1 x daily - 5 x weekly - 2 sets - 10 reps - Single Leg Bridge  - 1 x daily - 5 x weekly - 2 sets - 10 reps - Single Leg Squat with Chair Touch  - 1 x daily - 5 x weekly - 2 sets - 8 reps - Forward Step Down  - 1 x daily - 5 x weekly - 2 sets - 10 reps - Standing Gastroc Stretch on Foam 1/2 Roll  - 1 x daily - 5 x weekly - 2 sets - 30 sec hold - Alternating Heel Raises  - 1 x daily - 5 x weekly - 2 sets - 20 reps    PATIENT EDUCATION: Education details: HEP update Person educated: Patient Education method: Explanation,  Demonstration, Tactile cues, Verbal cues, and Handouts Education comprehension: verbalized understanding and returned demonstration    Below measures were taken at time of initial evaluation unless otherwise specified:    DIAGNOSTIC FINDINGS: 11/13/22 cervical MRI:  Postoperative changes from prior ACDF at C3 through C6 without significant residual spinal stenosis.Patchy signal abnormality involving the central and left cord at the level of C5-6, concerning for contusion/injury.  11/23/22 cervical xray: Prior ACDF from C3-C6. Intact hardware without evidence of loosening. Persistent prevertebral soft tissue swelling.Mild degenerative disc disease at C6-C7. Mild to moderate multilevel facet arthropathy.  COGNITION: Overall cognitive status: Within functional limits for tasks assessed   SENSATION: WFL  COORDINATION: Alternating pronation/supination: limited ROM on L and slow Alternating toe tap: intact B Finger to nose: unable on L d/t weakness, R UE intact Heel to shin: limited ROM on L d/t weakness    MUSCLE TONE: intact B LEs   POSTURE: rounded shoulders and flexed trunk   LOWER EXTREMITY ROM:     Active  Right Eval Left Eval  Hip flexion    Hip extension    Hip abduction    Hip adduction    Hip internal rotation    Hip external  rotation    Knee flexion    Knee extension    Ankle dorsiflexion 20 3  Ankle plantarflexion    Ankle inversion    Ankle eversion     (Blank rows = not tested)  LOWER EXTREMITY MMT:    MMT (in sitting) Right Eval Left Eval  Hip flexion 4+ 4-  Hip extension    Hip abduction 4 4-  Hip adduction 4 4-  Hip internal rotation    Hip external rotation    Knee flexion 5 4-  Knee extension 5 4  Ankle dorsiflexion 4+ 4  Ankle plantarflexion 4 4-  Ankle inversion    Ankle eversion    (Blank rows = not tested)   GAIT: Gait pattern: Decreased R step length, slightly decreased L knee flexion during swing through, good foot clearance and step through pattern Assistive device utilized: None Level of assistance: Modified independence   FUNCTIONAL TESTS:        PATIENT EDUCATION: Education details: prognosis, POC, HEP Person educated: Patient Education method: Explanation, Demonstration, Tactile cues, Verbal cues, and Handouts Education comprehension: verbalized understanding  HOME EXERCISE PROGRAM: Access Code: [email or web address] URL: [email or web address] Date: 12/06/2022 Prepared by: [facility] - Outpatient  Rehab - [facility]  Exercises - Seated Hamstring Curl with Anchored Resistance  - 1 x daily - 5 x weekly - 2 sets - 10 reps - Marching with Resistance  - 1 x daily - 5 x weekly - 2 sets - 10 reps - Squat with Chair Touch  - 1 x daily - 5 x weekly - 2 sets - 10 reps - Heel Raises with Counter Support  - 1 x daily - 5 x weekly - 2 sets - 10 reps   GOALS: Goals reviewed with patient? Yes  SHORT TERM GOALS: Target date: 12/27/2022  Patient to be independent with initial HEP. Baseline: HEP initiated Goal status: MET 12/24/22    LONG TERM GOALS: Target date: 01/17/2023  Patient to be independent with advanced HEP. Baseline: Not yet initiated  Goal status: IN PROGRESS  Patient to demonstrate B LE strength >/=4+/5.  Baseline: See above Goal status: IN  PROGRESS  Patient to demonstrate alternating reciprocal pattern when ascending and descending stairs with good stability and no handrail.  Baseline: requires handrail  Goal status: IN PROGRESS  Patient to score at least 26/30 on FGA in order to decrease risk of falls.  Baseline: 23/30 Goal status: IN PROGRESS  Patient to demonstrate symmetrical step length and knee flexion during swing phase with ambulation without AD with good stability.  Baseline: unable Goal status: IN PROGRESS  Patient to return to exercise regimen with modifications as needed.  Baseline: not initiated Goal status: IN PROGRESS    ASSESSMENT:  CLINICAL IMPRESSION: Continued with activities for general strength and power development for BLE with continued weakness evident in LLE as compared to right as ROM limited during resisted movements in comparison.  Additional training with focus on LUE weightbearing and core strengthening to improve stability and functional use of UE and improving coordination and overcoming tone which impacts reciprocal inhibition.  Training to facilitate single limb support and change of direction.  Tolerated session well with no adverse response and progressing with higher level mobility activities. Continued sessions to meet POC objectives  OBJECTIVE IMPAIRMENTS: Abnormal gait, decreased activity tolerance, decreased balance, decreased endurance, difficulty walking, decreased ROM, decreased strength, improper body mechanics, postural dysfunction, and pain.   ACTIVITY LIMITATIONS: carrying, lifting, standing, squatting, stairs, transfers, bathing, toileting, dressing, reach over head, hygiene/grooming, locomotion level, and caring for others  PARTICIPATION LIMITATIONS: meal prep, cleaning, laundry, driving, shopping, community activity, occupation, yard work, and church  PERSONAL FACTORS: Age, Fitness, Past/current experiences, Time since onset of injury/illness/exacerbation, and 3+  comorbidities: CKD, GERD, HTN, subdural hematoma 1991, back surgery 2019, L THA 2023, C3-6 ACDF 11/12/22  are also affecting patient's functional outcome.   REHAB POTENTIAL: Good  CLINICAL DECISION MAKING: Evolving/moderate complexity  EVALUATION COMPLEXITY: Moderate  PLAN:  PT FREQUENCY: 1-2x/week  PT DURATION: 6 weeks  PLANNED INTERVENTIONS: Therapeutic exercises, Therapeutic activity, Neuromuscular re-education, Balance training, Gait training, Patient/Family education, Self Care, Joint mobilization, Stair training, Vestibular training, Canalith repositioning, Aquatic Therapy, Dry Needling, Electrical stimulation, Cryotherapy, Moist heat, Taping, Manual therapy, and Re-evaluation  PLAN FOR NEXT SESSION: Continue to progress L LE strengthening, high level balance, stairs   9:54 AM, 01/02/23 M. Shary Decamp, PT, DPT Physical Therapist- [place] Office Number: [phone]

## 2023-01-03 ENCOUNTER — Other Ambulatory Visit (HOSPITAL_COMMUNITY): Payer: Self-pay

## 2023-01-04 ENCOUNTER — Other Ambulatory Visit (HOSPITAL_COMMUNITY): Payer: Self-pay

## 2023-01-04 ENCOUNTER — Other Ambulatory Visit: Payer: Self-pay | Admitting: Family Medicine

## 2023-01-04 MED ORDER — ATORVASTATIN CALCIUM 20 MG PO TABS
20.0000 mg | ORAL_TABLET | Freq: Every day | ORAL | 3 refills | Status: DC
  Filled 2023-01-04: qty 90, 90d supply, fill #0
  Filled 2023-04-02: qty 30, 30d supply, fill #1
  Filled 2023-05-06 – 2023-05-18 (×2): qty 30, 30d supply, fill #2
  Filled 2023-06-15: qty 30, 30d supply, fill #3
  Filled 2023-07-13: qty 30, 30d supply, fill #4
  Filled 2023-08-07: qty 30, 30d supply, fill #5
  Filled 2023-09-09: qty 30, 30d supply, fill #6
  Filled 2023-10-09: qty 30, 30d supply, fill #7
  Filled 2023-11-11: qty 30, 30d supply, fill #8
  Filled 2023-12-17: qty 30, 30d supply, fill #9

## 2023-01-05 ENCOUNTER — Other Ambulatory Visit (HOSPITAL_COMMUNITY): Payer: Self-pay

## 2023-01-07 ENCOUNTER — Other Ambulatory Visit (HOSPITAL_COMMUNITY): Payer: Self-pay

## 2023-01-07 ENCOUNTER — Ambulatory Visit: Payer: Commercial Managed Care - PPO | Admitting: Physical Therapy

## 2023-01-07 ENCOUNTER — Encounter: Payer: Self-pay | Admitting: Physical Therapy

## 2023-01-07 ENCOUNTER — Ambulatory Visit: Payer: Commercial Managed Care - PPO | Admitting: Occupational Therapy

## 2023-01-07 DIAGNOSIS — G959 Disease of spinal cord, unspecified: Secondary | ICD-10-CM | POA: Diagnosis not present

## 2023-01-07 DIAGNOSIS — R208 Other disturbances of skin sensation: Secondary | ICD-10-CM | POA: Diagnosis not present

## 2023-01-07 DIAGNOSIS — M79642 Pain in left hand: Secondary | ICD-10-CM | POA: Diagnosis not present

## 2023-01-07 DIAGNOSIS — R2681 Unsteadiness on feet: Secondary | ICD-10-CM | POA: Diagnosis not present

## 2023-01-07 DIAGNOSIS — M6281 Muscle weakness (generalized): Secondary | ICD-10-CM

## 2023-01-07 DIAGNOSIS — M79602 Pain in left arm: Secondary | ICD-10-CM | POA: Diagnosis not present

## 2023-01-07 DIAGNOSIS — R2689 Other abnormalities of gait and mobility: Secondary | ICD-10-CM | POA: Diagnosis not present

## 2023-01-07 DIAGNOSIS — R278 Other lack of coordination: Secondary | ICD-10-CM

## 2023-01-07 DIAGNOSIS — R29818 Other symptoms and signs involving the nervous system: Secondary | ICD-10-CM | POA: Diagnosis not present

## 2023-01-07 MED ORDER — DULOXETINE HCL 20 MG PO CPEP
20.0000 mg | ORAL_CAPSULE | Freq: Every day | ORAL | 0 refills | Status: DC
  Filled 2023-01-07: qty 30, 30d supply, fill #0

## 2023-01-07 NOTE — Therapy (Signed)
OUTPATIENT PHYSICAL THERAPY NEURO TREATMENT   Patient Name: Johnny Robinson MRN: [id number] DOB:1966/04/21, 57 y.o., male Today's Date: 01/07/2023   PCP:  Marguarite Arbour, MD   REFERRING PROVIDER: Jacquelynn Cree, PA-C  END OF SESSION:  PT End of Session - 01/07/23 0935     Visit Number 8    Number of Visits 13    Date for PT Re-Evaluation 01/17/23    Authorization Type [facility] [employer]    Authorization - Visit Number 8    Authorization - Number of Visits 25    PT Start Time 0935    PT Stop Time 1017    PT Time Calculation (min) 42 min    Equipment Utilized During Treatment Gait belt    Activity Tolerance Patient tolerated treatment well    Behavior During Therapy WFL for tasks assessed/performed                Past Medical History:  Diagnosis Date   Arthritis    Chronic kidney disease 2016   RENAL INFARCT   GERD (gastroesophageal reflux disease)    Hypertension    Reflux    Subdural hematoma 1991   Past Surgical History:  Procedure Laterality Date   BACK SURGERY  11/2017   burr hole  1992   to evacuate SDH   COLONOSCOPY WITH PROPOFOL N/A 09/02/2018   Procedure: COLONOSCOPY WITH PROPOFOL;  Surgeon: Midge Minium, MD;  Location: [facility] ENDOSCOPY;  Service: Endoscopy;  Laterality: N/A;   EXCISION MASS ABDOMINAL Left 05/28/2018   Procedure: EXCISIONAL BIOPSY MASS ABDOMINAL WALL;  Surgeon: Carolan Shiver, MD;  Location: [facility] ORS;  Service: General;  Laterality: Left;   PERIPHERAL VASCULAR CATHETERIZATION Right 05/18/2015   Procedure: Renal Angiography;  Surgeon: Annice Needy, MD;  Location: [facility] INVASIVE CV LAB;  Service: Cardiovascular;  Laterality: Right;   PERIPHERAL VASCULAR CATHETERIZATION Bilateral 05/18/2015   Procedure: Renal Intervention;  Surgeon: Annice Needy, MD;  Location: [facility] INVASIVE CV LAB;  Service: Cardiovascular;  Laterality: Bilateral;   TOTAL HIP ARTHROPLASTY Left 03/07/2022   Procedure: TOTAL HIP ARTHROPLASTY ANTERIOR APPROACH;  Surgeon:  Ollen Gross, MD;  Location: [facility] ORS;  Service: Orthopedics;  Laterality: Left;   Patient Active Problem List   Diagnosis Date Noted   Nerve pain 12/31/2022   Left shoulder pain 11/30/2022   Urinary hesitancy 11/30/2022   Constipation 11/30/2022   Biceps tendinitis of left shoulder 11/30/2022   Multifocal pneumonia 11/30/2022   Chronic pain syndrome 11/21/2022   HNP (herniated nucleus pulposus) with myelopathy, cervical 11/15/2022   Cervical myelopathy 11/13/2022   OA (osteoarthritis) of hip 03/07/2022   Primary osteoarthritis of left hip 03/07/2022   GERD (gastroesophageal reflux disease) 11/27/2021   Encounter for screening colonoscopy    Benign neoplasm of rectosigmoid junction    Hyperlipidemia 12/18/2017   Essential hypertension 12/17/2016   Chest pain 05/30/2015   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  Pt accompanied by: self  PERTINENT HISTORY: CKD, GERD, HTN, subdural hematoma 1991, back surgery 2019, L THA 2023  PAIN:  Are you having pain? Yes: NPRS scale: 3/10 Pain location: L arm Pain description: ache Aggravating factors: raising arm Relieving factors: meds  PRECAUTIONS: Cervical and Fall  WEIGHT BEARING RESTRICTIONS: No  FALLS: Has patient fallen in last 6 months? No  LIVING ENVIRONMENT: Lives with: lives with their spouse Lives in: House/apartment Stairs:  4 steps to enter without handrail; 3 story home  Has following equipment at home:  Counselling psychologist, Environmental consultant - 2 wheeled, Tour manager, and Grab bars  PLOF: Independent, Vocation/Vocational requirements: standing, walking, some desk work, and Leisure: tennis (has not done this since THA 2023)  PATIENT GOALS: return to tennis and fishing   OBJECTIVE:    TODAY'S TREATMENT: 01/07/2023 Activity Comments  Standing marching in place 5#, 2 x 10   5# weight on LLE: 2 x 10 -forward step over/back over target -side step over/back over target -triple step tap to targets-forward/side/back Mild decrease in step height with LLE with fatigue  2 min dynamic walking each: -sidestepping R and L -marching forward and back -forward/backward walking -forward tandem -forward lunge steps Good attention to movement pattern and L foot placement  Forward step up/opposite leg lifts x 15 reps Light UE support         PATIENT EDUCATION: Education details: Answered questions about joining gym-machines that might benefit him at the gym Person educated: Patient Education method: Explanation Education comprehension: verbalized understanding      HOME EXERCISE PROGRAM Last updated: 12/31/22 Access Code: [email or web address] URL: [email or web address] Date: 12/31/2022 Prepared by: [facility] - Outpatient  Rehab - [facility]  Exercises - Seated Hamstring Curl with Anchored Resistance  - 1 x daily - 5 x weekly - 2 sets - 10 reps - Marching with Resistance  - 1 x daily - 5 x weekly - 2 sets - 10 reps - Single Leg Bridge  - 1 x daily - 5 x weekly - 2 sets - 10 reps - Single Leg Squat with Chair Touch  - 1 x daily - 5 x weekly - 2 sets - 8 reps - Forward Step Down  - 1 x daily - 5 x weekly - 2 sets - 10 reps - Standing Gastroc Stretch on Foam 1/2 Roll  - 1 x daily - 5 x weekly - 2 sets - 30 sec hold - Alternating Heel Raises  - 1 x daily - 5 x weekly - 2 sets - 20 reps    PATIENT EDUCATION: Education details: HEP update Person educated: Patient Education method:  Explanation, Demonstration, Tactile cues, Verbal cues, and Handouts Education comprehension: verbalized understanding and returned demonstration    Below measures were taken at time of initial evaluation unless otherwise specified:    DIAGNOSTIC FINDINGS: 11/13/22 cervical MRI:  Postoperative changes from prior ACDF at C3 through C6 without significant residual spinal stenosis.Patchy signal abnormality involving the central and left cord at the level of C5-6, concerning for contusion/injury.  11/23/22 cervical xray: Prior ACDF from C3-C6. Intact hardware without evidence of loosening. Persistent prevertebral soft tissue swelling.Mild degenerative disc disease at C6-C7. Mild to moderate multilevel facet arthropathy.  COGNITION: Overall cognitive status: Within functional limits for tasks assessed   SENSATION: WFL  COORDINATION: Alternating pronation/supination: limited ROM on L and slow Alternating toe tap: intact B Finger to nose: unable on L d/t weakness, R UE intact Heel  to shin: limited ROM on L d/t weakness    MUSCLE TONE: intact B LEs   POSTURE: rounded shoulders and flexed trunk   LOWER EXTREMITY ROM:     Active  Right Eval Left Eval  Hip flexion    Hip extension    Hip abduction    Hip adduction    Hip internal rotation    Hip external rotation    Knee flexion    Knee extension    Ankle dorsiflexion 20 3  Ankle plantarflexion    Ankle inversion    Ankle eversion     (Blank rows = not tested)  LOWER EXTREMITY MMT:    MMT (in sitting) Right Eval Left Eval  Hip flexion 4+ 4-  Hip extension    Hip abduction 4 4-  Hip adduction 4 4-  Hip internal rotation    Hip external rotation    Knee flexion 5 4-  Knee extension 5 4  Ankle dorsiflexion 4+ 4  Ankle plantarflexion 4 4-  Ankle inversion    Ankle eversion    (Blank rows = not tested)   GAIT: Gait pattern: Decreased R step length, slightly decreased L knee flexion during swing through, good foot  clearance and step through pattern Assistive device utilized: None Level of assistance: Modified independence   FUNCTIONAL TESTS:        PATIENT EDUCATION: Education details: prognosis, POC, HEP Person educated: Patient Education method: Explanation, Demonstration, Tactile cues, Verbal cues, and Handouts Education comprehension: verbalized understanding  HOME EXERCISE PROGRAM: Access Code: [email or web address] URL: [email or web address] Date: 12/06/2022 Prepared by: [facility] - Outpatient  Rehab - [facility]  Exercises - Seated Hamstring Curl with Anchored Resistance  - 1 x daily - 5 x weekly - 2 sets - 10 reps - Marching with Resistance  - 1 x daily - 5 x weekly - 2 sets - 10 reps - Squat with Chair Touch  - 1 x daily - 5 x weekly - 2 sets - 10 reps - Heel Raises with Counter Support  - 1 x daily - 5 x weekly - 2 sets - 10 reps   GOALS: Goals reviewed with patient? Yes  SHORT TERM GOALS: Target date: 12/27/2022  Patient to be independent with initial HEP. Baseline: HEP initiated Goal status: MET 12/24/22    LONG TERM GOALS: Target date: 01/17/2023  Patient to be independent with advanced HEP. Baseline: Not yet initiated  Goal status: IN PROGRESS  Patient to demonstrate B LE strength >/=4+/5.  Baseline: See above Goal status: IN PROGRESS  Patient to demonstrate alternating reciprocal pattern when ascending and descending stairs with good stability and no handrail.  Baseline: requires handrail Goal status: IN PROGRESS  Patient to score at least 26/30 on FGA in order to decrease risk of falls.  Baseline: 23/30 Goal status: IN PROGRESS  Patient to demonstrate symmetrical step length and knee flexion during swing phase with ambulation without AD with good stability.  Baseline: unable Goal status: IN PROGRESS  Patient to return to exercise regimen with modifications as needed.  Baseline: not initiated Goal status: IN  PROGRESS    ASSESSMENT:  CLINICAL IMPRESSION: With weighted activities on LLE, pt noted to have decreased foot clearance, decreased amplitude of movement on LLE, especially with fatigue.  With dynamic balance activities, he does not have any overt LOB, and attends well to foot placement/foot clearance. He continues to benefit from skilled PT towards goals for improved functional mobility and higher level balance activities. OBJECTIVE IMPAIRMENTS:  Abnormal gait, decreased activity tolerance, decreased balance, decreased endurance, difficulty walking, decreased ROM, decreased strength, improper body mechanics, postural dysfunction, and pain.   ACTIVITY LIMITATIONS: carrying, lifting, standing, squatting, stairs, transfers, bathing, toileting, dressing, reach over head, hygiene/grooming, locomotion level, and caring for others  PARTICIPATION LIMITATIONS: meal prep, cleaning, laundry, driving, shopping, community activity, occupation, yard work, and church  PERSONAL FACTORS: Age, Fitness, Past/current experiences, Time since onset of injury/illness/exacerbation, and 3+ comorbidities: CKD, GERD, HTN, subdural hematoma 1991, back surgery 2019, L THA 2023, C3-6 ACDF 11/12/22  are also affecting patient's functional outcome.   REHAB POTENTIAL: Good  CLINICAL DECISION MAKING: Evolving/moderate complexity  EVALUATION COMPLEXITY: Moderate  PLAN:  PT FREQUENCY: 1-2x/week  PT DURATION: 6 weeks  PLANNED INTERVENTIONS: Therapeutic exercises, Therapeutic activity, Neuromuscular re-education, Balance training, Gait training, Patient/Family education, Self Care, Joint mobilization, Stair training, Vestibular training, Canalith repositioning, Aquatic Therapy, Dry Needling, Electrical stimulation, Cryotherapy, Moist heat, Taping, Manual therapy, and Re-evaluation  PLAN FOR NEXT SESSION: Work towards LTGs for strength, balance, gait (pt is feeling arm is most significant problem and he is returning to  work-discuss possibility of going down in frequency for PT?)  Lonia Blood, PT 01/07/23 12:02 PM Phone: [phone] Fax: [phone]  [facility] at [facility] [address], [address] [place], [place] [address] Phone # [phone] Fax # [phone]

## 2023-01-07 NOTE — Therapy (Signed)
OUTPATIENT OCCUPATIONAL THERAPY NEURO Treatment Session  Patient Name: Johnny Robinson MRN: [id number] DOB:1965-12-01, 57 y.o., male Today's Date: 01/07/2023  PCP: Marguarite Arbour, MD REFERRING PROVIDER: Jacquelynn Cree, PA-C  END OF SESSION:  OT End of Session - 01/07/23 1023     Visit Number 8    Number of Visits 17    Date for OT Re-Evaluation 02/01/23    Authorization Type [employer] employee - [employer] PPO    OT Start Time 1018    OT Stop Time 1100    OT Time Calculation (min) 42 min                    Past Medical History:  Diagnosis Date   Arthritis    Chronic kidney disease 2016   RENAL INFARCT   GERD (gastroesophageal reflux disease)    Hypertension    Reflux    Subdural hematoma 1991   Past Surgical History:  Procedure Laterality Date   BACK SURGERY  11/2017   burr hole  1992   to evacuate SDH   COLONOSCOPY WITH PROPOFOL N/A 09/02/2018   Procedure: COLONOSCOPY WITH PROPOFOL;  Surgeon: Midge Minium, MD;  Location: [facility] ENDOSCOPY;  Service: Endoscopy;  Laterality: N/A;   EXCISION MASS ABDOMINAL Left 05/28/2018   Procedure: EXCISIONAL BIOPSY MASS ABDOMINAL WALL;  Surgeon: Carolan Shiver, MD;  Location: [facility] ORS;  Service: General;  Laterality: Left;   PERIPHERAL VASCULAR CATHETERIZATION Right 05/18/2015   Procedure: Renal Angiography;  Surgeon: Annice Needy, MD;  Location: [facility] INVASIVE CV LAB;  Service: Cardiovascular;  Laterality: Right;   PERIPHERAL VASCULAR CATHETERIZATION Bilateral 05/18/2015   Procedure: Renal Intervention;  Surgeon: Annice Needy, MD;  Location: [facility] INVASIVE CV LAB;  Service: Cardiovascular;  Laterality: Bilateral;   TOTAL HIP ARTHROPLASTY Left 03/07/2022   Procedure: TOTAL HIP ARTHROPLASTY ANTERIOR APPROACH;  Surgeon: Ollen Gross, MD;  Location: [facility] ORS;  Service: Orthopedics;  Laterality: Left;   Patient Active Problem List   Diagnosis Date Noted   Nerve pain 12/31/2022   Left shoulder pain 11/30/2022   Urinary hesitancy  11/30/2022   Constipation 11/30/2022   Biceps tendinitis of left shoulder 11/30/2022   Multifocal pneumonia 11/30/2022   Chronic pain syndrome 11/21/2022   HNP (herniated nucleus pulposus) with myelopathy, cervical 11/15/2022   Cervical myelopathy 11/13/2022   OA (osteoarthritis) of hip 03/07/2022   Primary osteoarthritis of left hip 03/07/2022   GERD (gastroesophageal reflux disease) 11/27/2021   Encounter for screening colonoscopy    Benign neoplasm of rectosigmoid junction    Hyperlipidemia 12/18/2017   Essential hypertension 12/17/2016   Chest pain 05/30/2015   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 Dr. Jordan Likes expressed concerns  of cord contusion due to worsening of myelopathy and MRI spine done which showed post op changes form ACDF C3-C6 and patchy signal abnormality involving the central and left cord at C5/6 concerning for contusion/injury.  PRECAUTIONS: Cervical and Other: lifting precautions not > 5#  WEIGHT BEARING RESTRICTIONS: No  PAIN:  Are you having pain? Yes: NPRS scale: 2/10 Pain location: L shoulder Pain description:  aching Aggravating factors: certain movements Relieving factors: rest  FALLS: Has patient fallen in last 6 months? No  LIVING ENVIRONMENT: Lives with: lives with their spouse and 25 yo Lives in: House/apartment Stairs: Yes: Internal: full flight steps; on right going up and External: 4 steps; none Has following equipment at home: Quad cane small base, Walker - 2 wheeled, shower chair, bed side commode, and hand held shower head  PLOF: Independent, Independent with basic ADLs, and Vocation/Vocational requirements: practices law  PATIENT GOALS: pt would like to get arm progressing like leg   OBJECTIVE:   HAND DOMINANCE: Right  ADLs: Overall ADLs: requires increased time and thought to complete Transfers/ambulation related to ADLs: Mod I with SBQC Eating: requires assistance to cut food Grooming: Mod I UB Dressing: requiring assistance with buttons LB Dressing: requiring assistance with socks/tying shoes Toileting: Mod I Bathing: Mod I Tub Shower transfers: Mod I, has built in shower seat Equipment: Walk in shower  IADLs: Light housekeeping: has attempted vacuuming without issue Meal Prep: has attempted cooking, spouse or children assisting with retrieving items if >5# Medication management: spouse is filling pillbox and is reminding him to take  MOBILITY STATUS: Needs Assist: Mod I - Supervision with SBQC  POSTURE COMMENTS:  rounded shoulders and forward head  FUNCTIONAL OUTCOME MEASURES: Upper Extremity Functional Scale (UEFS): 33/80 =  41.25% function  UPPER EXTREMITY ROM:    Active ROM Right eval Left eval  Shoulder flexion  88 (requiring abduction to achieve)  Shoulder abduction  85  Shoulder adduction    Shoulder extension    Shoulder internal rotation  90%  Shoulder external rotation  30%  Elbow flexion  100  Elbow extension  WNL  Wrist flexion  WNL  Wrist extension  To neutral  Wrist ulnar deviation  trace  Wrist radial deviation  trace  Wrist  pronation    Wrist supination  To neutral  (Blank rows = not tested)  UPPER EXTREMITY MMT:     MMT Right eval Left eval  Shoulder flexion  2-/5  Shoulder abduction    Shoulder adduction    Shoulder extension    Shoulder internal rotation    Shoulder external rotation    Middle trapezius    Lower trapezius    Elbow flexion  4-/5  Elbow extension  4-/5  Wrist flexion  3-/5  Wrist extension  3-/5  Wrist ulnar deviation    Wrist radial deviation    Wrist pronation    Wrist supination    (Blank rows = not tested)  HAND FUNCTION: Loose gross grasp, unable to assess strength on dynamometer  COORDINATION: Finger Nose Finger test: able to reach chin to finger with LUE, decreased shoulder ROM limiting movement 9 Hole Peg test: Right: 23.03 sec; Left: able to place 5 pegs in 2 min time limit (pt needing to use side of container/multiple pegs to leverage to pick up one peg, also with difficulty with manipulation/translation in finger tips to orient to place in peg hole) Box and Blocks:  Right 44 blocks, Left 32 blocks  SENSATION: Reports duller sensation on L  compared to R  COGNITION: Overall cognitive status: Within functional limits for tasks assessed  VISION: Subjective report: no changes Baseline vision: Wears glasses for reading only   TODAY'S TREATMENT:                                                                                       01/07/23 Coordination:  animal stacking activity with focus on picking up and manipulating animal pieces to stack on top of other pieces.  Pt demonstrating good manipulation of pieces, requiring increased effort with integration of shoulder into reaching and stacking.   In-hand manipulation with initially picking up small beads, transitioning to stones as pt with inability to maintain small beads in grasp due to decreased finger flexion to maintain tightly closed fist.  Pt still with some drops with stones, however much improved ability to  focus on grasp, in-hand manipulation, and translation with larger items.  OT challenging pt to see how many he can fit in hand and then still translate one at a time to finger tips to place in cup.   Newman Pies toss: more gross motor coordination with focus on tossing ball within ipsilateral hand and from hand to hand with focus on motor control and ROM.  Pt demonstrating decreased supination on L limiting full catch/release especially when completing with ipsilateral hand.  Pt encouraged to continue addressing supination with weights from previous session and incorporating during functional tasks.    01/02/23 Resistance Clothespins: 2, 4# with LUE for mid functional reaching and sustained pinch. Pt req'd min cues and demonstration for movement pattern to challenge alternating grips and forearm rotation.  Pt demonstrating improved success with key grip > 3 point pinch.  Pt able to achieve shoulder flexion to ~90* during reaching with increased challenge to place on vertical dowel. Increase focus placed on 3 point pinch with repetition to challenge pinch and small functional reach. Dynamic reaching: engaged in reaching with LUE into cabinet at mod height with focus on increased shoulder flexion.  Pt able to reach ~100* to place and remove cups with LUE.  Pt demonstrating increased effort with increased ROM, but able to complete without assist from other UE. Coordination: engaged in picking up small sticks with focus on [facility] and pinch to pick up with focus on precision and motor control.  Then placing stick through slots with focus on [facility] with pinch and wrist movements to challenge motor control with incorporation of shoulder ROM.      12/31/22 Connect 4:  picking up and placing pieces into Connect 4 grid with focus on shoulder flexion in conjunction with grasp and coordination.  Pt demonstrating min difficulty with shoulder flexion to place into grid, but able to complete with increased effort.  OT increased  challenge to picking up 5 pieces in palm and translation palm to fingers to place into grid. Pt with increased challenge and dropping of pieces during in-hand manipulation. OT further modified task to changing to placing coins into coin slot to further challenge wrist extension and ulnar deviation as pt previously using gravity to aid in placing into Connect 4 grid.  Discussed functional carryover to office tasks and home  tasks. 9 hole peg test: 1:06.84 sec.  Pt demonstrating significant improvements, however still demonstrating gross grasp and use of positioning of UE to aid in manipulation/coordination. Placing items into cabinet at shoulder and mod-high range with focus on use of BUE together to incresae ROM and tolerance of LUE with movement.  Discussed NMR with use of BUE together for continued focus on increased functional ROM and strengthening.    PATIENT EDUCATION: Education details: ongoing condition specific education and UE ROM and strengthening exercises Person educated: Patient Education method: Explanation, Demonstration, Verbal cues, and Handouts Education comprehension: verbalized understanding and needs further education  HOME EXERCISE PROGRAM: Access Code: [email or web address] URL: [email or web address] Date: 12/20/2022 Prepared by: [facility] - Outpatient  Rehab - [facility]  Exercises - [facility] General Par  - 1-2 x daily - 2 sets - 10 reps - Open Book Chest Rotation Stretch on Foam 1/2 Roll  - 1-2 x daily - 2 sets - 10 reps - Shoulder PNF D2 Flexion  - 1-2 x daily - 2 sets - 10 reps - Shoulder Flexion Wall Slide with Towel  - 1-2 x daily - 2 sets - 10 reps - Forearm Supination with Dumbbell  - 1 x daily - 2 sets - 10 reps - Seated Wrist Flexion with Dumbbell  - 1 x daily - 2 sets - 1 reps - Seated Single Arm Bicep Curls Supinated with Dumbbell  - 1 x daily - 2 sets - 10 reps   GOALS: Goals reviewed with patient? Yes  SHORT TERM GOALS: Target date: 01/04/23  Pt will  be independent with coordination and ROM HEP. Baseline: Goal status: MET - 12/31/22  2.  Pt will demonstrate increased coordination to be able to place all 9 pegs into peg board during 9 hole peg test in 2 min time limit. Baseline: able to place 5 in 2 min time limit Goal status: MET - 1:06.84 on 12/31/22  3.  Pt will demonstrate increased L shoulder ROM to allow for ability to retreive light weight object from shoulder height cabinet. Baseline: 88* shoulder flexion with abduction Goal status: IN PROGRESS  4.  Pt will demonstrate improved pinch and coordination as needed to complete clothing fasteners (buttons, tying shoes, etc). Baseline:  Goal status: MET - 01/02/23   LONG TERM GOALS: Target date: 02/01/23  Pt will be independent with advanced HEP. Baseline:  Goal status: IN PROGRESS  2.  Pt will demonstrate improved UE functional use for ADLs as evidenced by increasing box/ blocks score by 6 blocks with LUE Baseline: Right 44 blocks, Left 32 blocks Goal status: IN PROGRESS  3.  Pt will demonstrate improved fine motor coordination for ADLs as evidenced by being able to complete 9 hole peg test (place and remove) in 2 min time limit Baseline: able to place 5 in 2 min time limit Goal status: MET - 1:06.84 on 12/31/22  4.  Pt will demonstrate increased L shoulder ROM and strength to tolerate holding phone to ear for 5 mins phone conversation. Baseline:  Goal status: IN PROGRESS  5.  Pt will report improved LUE functional use as evidenced by increased score on UEFS to > 60%. Baseline: 41.25% Goal status: IN PROGRESS    ASSESSMENT:  CLINICAL IMPRESSION: Pt continues to demonstrate decreased forearm supination and full grasp impacting in-hand manipulation and tossing ball.  Pt reports tightness in fingers and continued impaired sensation, especially along ulnar distribution.  Pt expressing desire to start going to the gym, however due to decreased  grasp and limited ROM in LUE  cautioned pt with full return.  Discussed possibly working with weight stack during future session and incorporating sustained grasp into resistance and/or weight exercises.  PERFORMANCE DEFICITS: in functional skills including ADLs, IADLs, coordination, dexterity, sensation, ROM, strength, pain, Fine motor control, Gross motor control, balance, body mechanics, endurance, decreased knowledge of precautions, decreased knowledge of use of DME, and UE functional use and psychosocial skills including coping strategies and environmental adaptation.   IMPAIRMENTS: are limiting patient from ADLs, IADLs, rest and sleep, and work.   CO-MORBIDITIES: may have co-morbidities  that affects occupational performance. Patient will benefit from skilled OT to address above impairments and improve overall function.  MODIFICATION OR ASSISTANCE TO COMPLETE EVALUATION: Min-Moderate modification of tasks or assist with assess necessary to complete an evaluation.  OT OCCUPATIONAL PROFILE AND HISTORY: Detailed assessment: Review of records and additional review of physical, cognitive, psychosocial history related to current functional performance.  CLINICAL DECISION MAKING: LOW - limited treatment options, no task modification necessary  REHAB POTENTIAL: Good  EVALUATION COMPLEXITY: Moderate    PLAN:  OT FREQUENCY: 2x/week  OT DURATION: 8 weeks  PLANNED INTERVENTIONS: self care/ADL training, therapeutic exercise, therapeutic activity, neuromuscular re-education, manual therapy, passive range of motion, balance training, functional mobility training, electrical stimulation, ultrasound, moist heat, cryotherapy, patient/family education, psychosocial skills training, energy conservation, coping strategies training, and DME and/or AE instructions  RECOMMENDED OTHER SERVICES: NA  CONSULTED AND AGREED WITH PLAN OF CARE: Patient  PLAN FOR NEXT SESSION: Review and add to shoulder ROM as appropriate, may be able to  engage in resistance bands and/or other strengthening for shoulder - as pt wanting to return to the gym, engage in reaching behind head to simulate holding phone to ear, continue to address coordination with shoulder ROM.   Rosalio Loud, OTR/L 01/07/2023, 10:23 AM

## 2023-01-08 ENCOUNTER — Other Ambulatory Visit (INDEPENDENT_AMBULATORY_CARE_PROVIDER_SITE_OTHER): Payer: Self-pay | Admitting: Vascular Surgery

## 2023-01-08 ENCOUNTER — Ambulatory Visit (INDEPENDENT_AMBULATORY_CARE_PROVIDER_SITE_OTHER): Payer: Commercial Managed Care - PPO

## 2023-01-08 DIAGNOSIS — M79604 Pain in right leg: Secondary | ICD-10-CM

## 2023-01-08 DIAGNOSIS — R209 Unspecified disturbances of skin sensation: Secondary | ICD-10-CM | POA: Diagnosis not present

## 2023-01-08 DIAGNOSIS — M79605 Pain in left leg: Secondary | ICD-10-CM | POA: Diagnosis not present

## 2023-01-08 LAB — VAS US ABI WITH/WO TBI
Left ABI: 1.17
Right ABI: 1.26

## 2023-01-09 ENCOUNTER — Ambulatory Visit: Payer: Commercial Managed Care - PPO

## 2023-01-09 ENCOUNTER — Ambulatory Visit: Payer: Commercial Managed Care - PPO | Admitting: Occupational Therapy

## 2023-01-09 DIAGNOSIS — G959 Disease of spinal cord, unspecified: Secondary | ICD-10-CM | POA: Diagnosis not present

## 2023-01-09 DIAGNOSIS — R2681 Unsteadiness on feet: Secondary | ICD-10-CM

## 2023-01-09 DIAGNOSIS — M6281 Muscle weakness (generalized): Secondary | ICD-10-CM | POA: Diagnosis not present

## 2023-01-09 DIAGNOSIS — R208 Other disturbances of skin sensation: Secondary | ICD-10-CM | POA: Diagnosis not present

## 2023-01-09 DIAGNOSIS — M79642 Pain in left hand: Secondary | ICD-10-CM | POA: Diagnosis not present

## 2023-01-09 DIAGNOSIS — R29818 Other symptoms and signs involving the nervous system: Secondary | ICD-10-CM | POA: Diagnosis not present

## 2023-01-09 DIAGNOSIS — R278 Other lack of coordination: Secondary | ICD-10-CM

## 2023-01-09 DIAGNOSIS — M79602 Pain in left arm: Secondary | ICD-10-CM

## 2023-01-09 DIAGNOSIS — R2689 Other abnormalities of gait and mobility: Secondary | ICD-10-CM

## 2023-01-09 NOTE — Therapy (Signed)
OUTPATIENT OCCUPATIONAL THERAPY NEURO Treatment Session  Patient Name: Johnny Robinson MRN: [id number] DOB:11-Nov-1965, 57 y.o., male Today's Date: 01/09/2023  PCP: Marguarite Arbour, MD REFERRING PROVIDER: Jacquelynn Cree, PA-C  END OF SESSION:  OT End of Session - 01/09/23 1021     Visit Number 9    Number of Visits 17    Date for OT Re-Evaluation 02/01/23    Authorization Type [employer] employee - [employer] PPO    OT Start Time 1018    OT Stop Time 1100    OT Time Calculation (min) 42 min                     Past Medical History:  Diagnosis Date   Arthritis    Chronic kidney disease 2016   RENAL INFARCT   GERD (gastroesophageal reflux disease)    Hypertension    Reflux    Subdural hematoma 1991   Past Surgical History:  Procedure Laterality Date   BACK SURGERY  11/2017   burr hole  1992   to evacuate SDH   COLONOSCOPY WITH PROPOFOL N/A 09/02/2018   Procedure: COLONOSCOPY WITH PROPOFOL;  Surgeon: Midge Minium, MD;  Location: [facility] ENDOSCOPY;  Service: Endoscopy;  Laterality: N/A;   EXCISION MASS ABDOMINAL Left 05/28/2018   Procedure: EXCISIONAL BIOPSY MASS ABDOMINAL WALL;  Surgeon: Carolan Shiver, MD;  Location: [facility] ORS;  Service: General;  Laterality: Left;   PERIPHERAL VASCULAR CATHETERIZATION Right 05/18/2015   Procedure: Renal Angiography;  Surgeon: Annice Needy, MD;  Location: [facility] INVASIVE CV LAB;  Service: Cardiovascular;  Laterality: Right;   PERIPHERAL VASCULAR CATHETERIZATION Bilateral 05/18/2015   Procedure: Renal Intervention;  Surgeon: Annice Needy, MD;  Location: [facility] INVASIVE CV LAB;  Service: Cardiovascular;  Laterality: Bilateral;   TOTAL HIP ARTHROPLASTY Left 03/07/2022   Procedure: TOTAL HIP ARTHROPLASTY ANTERIOR APPROACH;  Surgeon: Ollen Gross, MD;  Location: [facility] ORS;  Service: Orthopedics;  Laterality: Left;   Patient Active Problem List   Diagnosis Date Noted   Nerve pain 12/31/2022   Left shoulder pain 11/30/2022   Urinary  hesitancy 11/30/2022   Constipation 11/30/2022   Biceps tendinitis of left shoulder 11/30/2022   Multifocal pneumonia 11/30/2022   Chronic pain syndrome 11/21/2022   HNP (herniated nucleus pulposus) with myelopathy, cervical 11/15/2022   Cervical myelopathy 11/13/2022   OA (osteoarthritis) of hip 03/07/2022   Primary osteoarthritis of left hip 03/07/2022   GERD (gastroesophageal reflux disease) 11/27/2021   Encounter for screening colonoscopy    Benign neoplasm of rectosigmoid junction    Hyperlipidemia 12/18/2017   Essential hypertension 12/17/2016   Chest pain 05/30/2015   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 Dr. Jordan Likes expressed concerns  of cord contusion due to worsening of myelopathy and MRI spine done which showed post op changes form ACDF C3-C6 and patchy signal abnormality involving the central and left cord at C5/6 concerning for contusion/injury.  PRECAUTIONS: Cervical and Other: lifting precautions not > 5#  WEIGHT BEARING RESTRICTIONS: No  PAIN:  Are you having pain? Yes: NPRS scale: 2/10 Pain location: L shoulder Pain description:  aching Aggravating factors: certain movements Relieving factors: rest  FALLS: Has patient fallen in last 6 months? No  LIVING ENVIRONMENT: Lives with: lives with their spouse and 25 yo Lives in: House/apartment Stairs: Yes: Internal: full flight steps; on right going up and External: 4 steps; none Has following equipment at home: Quad cane small base, Walker - 2 wheeled, shower chair, bed side commode, and hand held shower head  PLOF: Independent, Independent with basic ADLs, and Vocation/Vocational requirements: practices law  PATIENT GOALS: pt would like to get arm progressing like leg   OBJECTIVE:   HAND DOMINANCE: Right  ADLs: Overall ADLs: requires increased time and thought to complete Transfers/ambulation related to ADLs: Mod I with SBQC Eating: requires assistance to cut food Grooming: Mod I UB Dressing: requiring assistance with buttons LB Dressing: requiring assistance with socks/tying shoes Toileting: Mod I Bathing: Mod I Tub Shower transfers: Mod I, has built in shower seat Equipment: Walk in shower  IADLs: Light housekeeping: has attempted vacuuming without issue Meal Prep: has attempted cooking, spouse or children assisting with retrieving items if >5# Medication management: spouse is filling pillbox and is reminding him to take  MOBILITY STATUS: Needs Assist: Mod I - Supervision with SBQC  POSTURE COMMENTS:  rounded shoulders and forward head  FUNCTIONAL OUTCOME MEASURES: Upper Extremity Functional Scale (UEFS): 33/80 =  41.25% function  UPPER EXTREMITY ROM:    Active ROM Right eval Left eval  Shoulder flexion  88 (requiring abduction to achieve)  Shoulder abduction  85  Shoulder adduction    Shoulder extension    Shoulder internal rotation  90%  Shoulder external rotation  30%  Elbow flexion  100  Elbow extension  WNL  Wrist flexion  WNL  Wrist extension  To neutral  Wrist ulnar deviation  trace  Wrist radial deviation  trace  Wrist  pronation    Wrist supination  To neutral  (Blank rows = not tested)  UPPER EXTREMITY MMT:     MMT Right eval Left eval  Shoulder flexion  2-/5  Shoulder abduction    Shoulder adduction    Shoulder extension    Shoulder internal rotation    Shoulder external rotation    Middle trapezius    Lower trapezius    Elbow flexion  4-/5  Elbow extension  4-/5  Wrist flexion  3-/5  Wrist extension  3-/5  Wrist ulnar deviation    Wrist radial deviation    Wrist pronation    Wrist supination    (Blank rows = not tested)  HAND FUNCTION: Loose gross grasp, unable to assess strength on dynamometer  COORDINATION: Finger Nose Finger test: able to reach chin to finger with LUE, decreased shoulder ROM limiting movement 9 Hole Peg test: Right: 23.03 sec; Left: able to place 5 pegs in 2 min time limit (pt needing to use side of container/multiple pegs to leverage to pick up one peg, also with difficulty with manipulation/translation in finger tips to orient to place in peg hole) Box and Blocks:  Right 44 blocks, Left 32 blocks  SENSATION: Reports duller sensation on L  compared to R  COGNITION: Overall cognitive status: Within functional limits for tasks assessed  VISION: Subjective report: no changes Baseline vision: Wears glasses for reading only   TODAY'S TREATMENT:                                                                                       01/09/23 Dynamic reaching: engaged in picking up small rings with LUE and crossing midline to place over vertical dowel to challenge coordination and shoulder ROM.  Pt dropping initial ring, however with improved success with repetition. Pt demonstrating fatigue in shoulder with repetition, requiring assist from R to complete final 2 rings. Finger flexion/extension: Pt reports purchasing and hand grip strengthener, reporting too challenging.  Therefore OT educated on use of rubber band and digiflex for finger flexion and extension.  Pt  attempting isolated finger flexion and extension with rubber band and whole hand/finger flexion with improved ability to complete. Theraputty: OT instructed in full hand grasp, tip to tip pinch, 3 point pinch, key pinch, pinch and pull, and finger extension with yellow theraputty.  Pt completing each with moderate effort but ability to make indentions in putty. Box and blocks: L 41 blocks, pt reports fatigue in shoulder ~45 seconds in.    01/07/23 Coordination:  animal stacking activity with focus on picking up and manipulating animal pieces to stack on top of other pieces.  Pt demonstrating good manipulation of pieces, requiring increased effort with integration of shoulder into reaching and stacking.   In-hand manipulation with initially picking up small beads, transitioning to stones as pt with inability to maintain small beads in grasp due to decreased finger flexion to maintain tightly closed fist.  Pt still with some drops with stones, however much improved ability to focus on grasp, in-hand manipulation, and translation with larger items.  OT challenging pt to see how many he can fit in hand and then still translate one at a time to finger tips to place in cup.   Newman Pies toss: more gross motor coordination with focus on tossing ball within ipsilateral hand and from hand to hand with focus on motor control and ROM.  Pt demonstrating decreased supination on L limiting full catch/release especially when completing with ipsilateral hand.  Pt encouraged to continue addressing supination with weights from previous session and incorporating during functional tasks.    01/02/23 Resistance Clothespins: 2, 4# with LUE for mid functional reaching and sustained pinch. Pt req'd min cues and demonstration for movement pattern to challenge alternating grips and forearm rotation.  Pt demonstrating improved success with key grip > 3 point pinch.  Pt able to achieve shoulder flexion to ~90* during reaching with  increased challenge to place on vertical dowel. Increase focus placed on 3 point pinch with repetition to challenge pinch and small functional reach. Dynamic reaching: engaged in reaching with LUE into cabinet at mod height with focus on increased shoulder flexion.  Pt able to reach ~100* to place and remove cups with LUE.  Pt demonstrating increased effort with increased ROM, but able to complete without assist from other UE. Coordination: engaged in picking up small sticks with focus on [facility] and  pinch to pick up with focus on precision and motor control.  Then placing stick through slots with focus on [facility] with pinch and wrist movements to challenge motor control with incorporation of shoulder ROM.     PATIENT EDUCATION: Education details: ongoing condition specific education and UE ROM and strengthening exercises Person educated: Patient Education method: Explanation, Demonstration, Verbal cues, and Handouts Education comprehension: verbalized understanding and needs further education  HOME EXERCISE PROGRAM: Access Code: [email or web address] URL: [email or web address] Date: 12/20/2022 Prepared by: [facility] - Outpatient  Rehab - [facility]  Exercises - [facility]  - 1-2 x daily - 2 sets - 10 reps - Open Book Chest Rotation Stretch on Foam 1/2 Roll  - 1-2 x daily - 2 sets - 10 reps - Shoulder PNF D2 Flexion  - 1-2 x daily - 2 sets - 10 reps - Shoulder Flexion Wall Slide with Towel  - 1-2 x daily - 2 sets - 10 reps - Forearm Supination with Dumbbell  - 1 x daily - 2 sets - 10 reps - Seated Wrist Flexion with Dumbbell  - 1 x daily - 2 sets - 1 reps - Seated Single Arm Bicep Curls Supinated with Dumbbell  - 1 x daily - 2 sets - 10 reps  Access Code: [email or web address] URL: [email or web address] Date: 01/09/2023 Prepared by: [facility] - Outpatient  Rehab - [facility]  Exercises - Putty Squeezes  - 1 x daily - 1 sets - 10 reps - Rolling Putty on Table  - 1 x daily - 1  sets - 10 reps - Tip Pinch with Putty  - 1 x daily - 1 sets - 10 reps - 3-Point Pinch with Putty  - 1 x daily - 1 sets - 10 reps - Key Pinch with Putty  - 1 x daily - 1 sets - 10 reps - Finger Pinch and Pull with Putty  - 1 x daily - 1 sets - 10 reps - Finger Exension with Putty  - 1 x daily - 1 sets - 10 reps - Quick Finger Spreading with Rubber Band  - 1 x daily - 1 sets - 10 reps - Seated Finger Flexion with Resistance  - 1 x daily - 1 sets - 10 reps   GOALS: Goals reviewed with patient? Yes  SHORT TERM GOALS: Target date: 01/04/23  Pt will be independent with coordination and ROM HEP. Baseline: Goal status: MET - 12/31/22  2.  Pt will demonstrate increased coordination to be able to place all 9 pegs into peg board during 9 hole peg test in 2 min time limit. Baseline: able to place 5 in 2 min time limit Goal status: MET - 1:06.84 on 12/31/22  3.  Pt will demonstrate increased L shoulder ROM to allow for ability to retreive light weight object from shoulder height cabinet. Baseline: 88* shoulder flexion with abduction Goal status: IN PROGRESS  4.  Pt will demonstrate improved pinch and coordination as needed to complete clothing fasteners (buttons, tying shoes, etc). Baseline:  Goal status: MET - 01/02/23   LONG TERM GOALS: Target date: 02/01/23  Pt will be independent with advanced HEP. Baseline:  Goal status: IN PROGRESS  2.  Pt will demonstrate improved UE functional use for ADLs as evidenced by increasing box/ blocks score by 6 blocks with LUE Baseline: Right 44 blocks, Left 32 blocks Goal status: MET - 41 blocks on 01/09/23  3.  Pt will demonstrate improved fine motor coordination for ADLs as evidenced by  being able to complete 9 hole peg test (place and remove) in 2 min time limit Baseline: able to place 5 in 2 min time limit Goal status: MET - 1:06.84 on 12/31/22  4.  Pt will demonstrate increased L shoulder ROM and strength to tolerate holding phone to ear for 5 mins  phone conversation. Baseline:  Goal status: IN PROGRESS  5.  Pt will report improved LUE functional use as evidenced by increased score on UEFS to > 60%. Baseline: 41.25% Goal status: IN PROGRESS    ASSESSMENT:  CLINICAL IMPRESSION: Pt continues to demonstrate and report decreased endurance with increased reaching and tasks incorporating increased shoulder flexion.  Pt demonstrating good technique with theraputty and asking appropriate questions about strengthening for grasp and pinch.  OT reiterated awareness of body positioning during table top activities to purposefully minimize of increase shoulder challenge based on purpose of activity.  PERFORMANCE DEFICITS: in functional skills including ADLs, IADLs, coordination, dexterity, sensation, ROM, strength, pain, Fine motor control, Gross motor control, balance, body mechanics, endurance, decreased knowledge of precautions, decreased knowledge of use of DME, and UE functional use and psychosocial skills including coping strategies and environmental adaptation.   IMPAIRMENTS: are limiting patient from ADLs, IADLs, rest and sleep, and work.   CO-MORBIDITIES: may have co-morbidities  that affects occupational performance. Patient will benefit from skilled OT to address above impairments and improve overall function.  MODIFICATION OR ASSISTANCE TO COMPLETE EVALUATION: Min-Moderate modification of tasks or assist with assess necessary to complete an evaluation.  OT OCCUPATIONAL PROFILE AND HISTORY: Detailed assessment: Review of records and additional review of physical, cognitive, psychosocial history related to current functional performance.  CLINICAL DECISION MAKING: LOW - limited treatment options, no task modification necessary  REHAB POTENTIAL: Good  EVALUATION COMPLEXITY: Moderate    PLAN:  OT FREQUENCY: 2x/week  OT DURATION: 8 weeks  PLANNED INTERVENTIONS: self care/ADL training, therapeutic exercise, therapeutic activity,  neuromuscular re-education, manual therapy, passive range of motion, balance training, functional mobility training, electrical stimulation, ultrasound, moist heat, cryotherapy, patient/family education, psychosocial skills training, energy conservation, coping strategies training, and DME and/or AE instructions  RECOMMENDED OTHER SERVICES: NA  CONSULTED AND AGREED WITH PLAN OF CARE: Patient  PLAN FOR NEXT SESSION: Review and add to shoulder ROM as appropriate, may be able to engage in resistance bands and/or other strengthening for shoulder - as pt wanting to return to the gym, engage in reaching behind head to simulate holding phone to ear, continue to address coordination with shoulder ROM.   Rosalio Loud, OTR/L 01/09/2023, 10:21 AM

## 2023-01-09 NOTE — Therapy (Signed)
OUTPATIENT PHYSICAL THERAPY NEURO TREATMENT   Patient Name: Johnny Robinson MRN: [id number] DOB:April 11, 1966, 57 y.o., male Today's Date: 01/09/2023   PCP:  Marguarite Arbour, MD   REFERRING PROVIDER: Jacquelynn Cree, PA-C  END OF SESSION:  PT End of Session - 01/09/23 1100     Visit Number 9    Number of Visits 13    Date for PT Re-Evaluation 01/17/23    Authorization Type [facility] [employer]    Authorization - Visit Number 9    Authorization - Number of Visits 25    PT Start Time 1100    PT Stop Time 1145    PT Time Calculation (min) 45 min    Equipment Utilized During Treatment Gait belt    Activity Tolerance Patient tolerated treatment well    Behavior During Therapy WFL for tasks assessed/performed                Past Medical History:  Diagnosis Date   Arthritis    Chronic kidney disease 2016   RENAL INFARCT   GERD (gastroesophageal reflux disease)    Hypertension    Reflux    Subdural hematoma 1991   Past Surgical History:  Procedure Laterality Date   BACK SURGERY  11/2017   burr hole  1992   to evacuate [facility]   COLONOSCOPY WITH PROPOFOL N/A 09/02/2018   Procedure: COLONOSCOPY WITH PROPOFOL;  Surgeon: Midge Minium, MD;  Location: [facility] ENDOSCOPY;  Service: Endoscopy;  Laterality: N/A;   EXCISION MASS ABDOMINAL Left 05/28/2018   Procedure: EXCISIONAL BIOPSY MASS ABDOMINAL WALL;  Surgeon: Carolan Shiver, MD;  Location: [facility] ORS;  Service: General;  Laterality: Left;   PERIPHERAL VASCULAR CATHETERIZATION Right 05/18/2015   Procedure: Renal Angiography;  Surgeon: Annice Needy, MD;  Location: [facility] INVASIVE CV LAB;  Service: Cardiovascular;  Laterality: Right;   PERIPHERAL VASCULAR CATHETERIZATION Bilateral 05/18/2015   Procedure: Renal Intervention;  Surgeon: Annice Needy, MD;  Location: [facility] INVASIVE CV LAB;  Service: Cardiovascular;  Laterality: Bilateral;   TOTAL HIP ARTHROPLASTY Left 03/07/2022   Procedure: TOTAL HIP ARTHROPLASTY ANTERIOR APPROACH;  Surgeon:  Ollen Gross, MD;  Location: [facility] ORS;  Service: Orthopedics;  Laterality: Left;   Patient Active Problem List   Diagnosis Date Noted   Nerve pain 12/31/2022   Left shoulder pain 11/30/2022   Urinary hesitancy 11/30/2022   Constipation 11/30/2022   Biceps tendinitis of left shoulder 11/30/2022   Multifocal pneumonia 11/30/2022   Chronic pain syndrome 11/21/2022   HNP (herniated nucleus pulposus) with myelopathy, cervical 11/15/2022   Cervical myelopathy 11/13/2022   OA (osteoarthritis) of hip 03/07/2022   Primary osteoarthritis of left hip 03/07/2022   GERD (gastroesophageal reflux disease) 11/27/2021   Encounter for screening colonoscopy    Benign neoplasm of rectosigmoid junction    Hyperlipidemia 12/18/2017   Essential hypertension 12/17/2016   Chest pain 05/30/2015   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  Pt accompanied by: self  PERTINENT HISTORY: CKD, GERD, HTN, subdural hematoma 1991, back surgery 2019, L THA 2023  PAIN:  Are you having pain? Yes: NPRS scale: 3/10 Pain location: L arm Pain description: ache Aggravating factors: raising arm Relieving factors: meds  PRECAUTIONS: Cervical and Fall  WEIGHT BEARING RESTRICTIONS: No  FALLS: Has patient fallen in last 6 months? No  LIVING ENVIRONMENT: Lives with: lives with their spouse Lives in: House/apartment Stairs:  4 steps to enter without handrail; 3 story home  Has  following equipment at home: Counselling psychologist, Environmental consultant - 2 wheeled, Tour manager, and Grab bars  PLOF: Independent, Vocation/Vocational requirements: standing, walking, some desk work, and Leisure: tennis (has not done this since THA 2023)  PATIENT GOALS: return to tennis and fishing   OBJECTIVE:   TODAY'S TREATMENT: 01/09/23 Activity Comments  Supine hip flexion 5-15# 3x10 total   Supine bridge with hip abd and static shoulder flexion hold 3x10  Walking push-up plank on Bosu 3x10 For UE weightbearing and core stabilization. Bosu inverted on low mat table  Rythmic stabilization 3x30 sec Left hand ball to wall  Single arm row 3x10  10# using cuff with ring instead of handle to accommodate lack of left grip  Resisted walking 2x2 min   Tandem walk to retrowalk 2x45 ft       TODAY'S TREATMENT: 01/07/2023 Activity Comments  Standing marching in place 5#, 2 x 10   5# weight on LLE: 2 x 10 -forward step over/back over target -side step over/back over target -triple step tap to targets-forward/side/back Mild decrease in step height with LLE with fatigue  2 min dynamic walking each: -sidestepping R and L -marching forward and back -forward/backward walking -forward tandem -forward lunge steps Good attention to movement pattern and L foot placement  Forward step up/opposite leg lifts x 15 reps Light UE support         PATIENT EDUCATION: Education details: Answered questions about joining gym-machines that might benefit him at the gym Person educated: Patient Education method: Explanation Education comprehension: verbalized understanding      HOME EXERCISE PROGRAM Last updated: 12/31/22 Access Code: [email or web address] URL: [email or web address] Date: 12/31/2022 Prepared by: [facility] - Outpatient  Rehab - [facility]  Exercises - Seated Hamstring Curl with Anchored Resistance  - 1 x daily - 5 x weekly - 2 sets - 10 reps - Marching with Resistance  - 1 x daily - 5  x weekly - 2 sets - 10 reps - Single Leg Bridge  - 1 x daily - 5 x weekly - 2 sets - 10 reps - Single Leg Squat with Chair Touch  - 1 x daily - 5 x weekly - 2 sets - 8 reps - Forward Step Down  - 1 x daily - 5 x weekly - 2 sets - 10 reps - Standing Gastroc Stretch on Foam 1/2 Roll  - 1 x daily - 5 x weekly - 2 sets - 30 sec hold - Alternating Heel Raises  - 1 x daily - 5 x weekly - 2 sets - 20 reps    PATIENT EDUCATION: Education details: HEP update Person educated: Patient Education method: Explanation, Demonstration, Tactile cues, Verbal cues, and Handouts Education comprehension: verbalized understanding and returned demonstration    Below measures were taken at time of initial evaluation unless otherwise specified:    DIAGNOSTIC FINDINGS: 11/13/22 cervical MRI:  Postoperative changes from prior ACDF at C3 through C6 without  significant residual spinal stenosis.Patchy signal abnormality involving the central and left cord at the level of C5-6, concerning for contusion/injury.  11/23/22 cervical xray: Prior ACDF from C3-C6. Intact hardware without evidence of loosening. Persistent prevertebral soft tissue swelling.Mild degenerative disc disease at C6-C7. Mild to moderate multilevel facet arthropathy.  COGNITION: Overall cognitive status: Within functional limits for tasks assessed   SENSATION: WFL  COORDINATION: Alternating pronation/supination: limited ROM on L and slow Alternating toe tap: intact B Finger to nose: unable on L d/t weakness, R UE intact Heel to shin: limited ROM on L d/t weakness    MUSCLE TONE: intact B LEs   POSTURE: rounded shoulders and flexed trunk   LOWER EXTREMITY ROM:     Active  Right Eval Left Eval  Hip flexion    Hip extension    Hip abduction    Hip adduction    Hip internal rotation    Hip external rotation    Knee flexion    Knee extension    Ankle dorsiflexion 20 3  Ankle plantarflexion    Ankle inversion    Ankle eversion      (Blank rows = not tested)  LOWER EXTREMITY MMT:    MMT (in sitting) Right Eval Left Eval  Hip flexion 4+ 4-  Hip extension    Hip abduction 4 4-  Hip adduction 4 4-  Hip internal rotation    Hip external rotation    Knee flexion 5 4-  Knee extension 5 4  Ankle dorsiflexion 4+ 4  Ankle plantarflexion 4 4-  Ankle inversion    Ankle eversion    (Blank rows = not tested)   GAIT: Gait pattern: Decreased R step length, slightly decreased L knee flexion during swing through, good foot clearance and step through pattern Assistive device utilized: None Level of assistance: Modified independence   FUNCTIONAL TESTS:        PATIENT EDUCATION: Education details: prognosis, POC, HEP Person educated: Patient Education method: Explanation, Demonstration, Tactile cues, Verbal cues, and Handouts Education comprehension: verbalized understanding  HOME EXERCISE PROGRAM: Access Code: [email or web address] URL: [email or web address] Date: 12/06/2022 Prepared by: [facility] - Outpatient  Rehab - [facility]  Exercises - Seated Hamstring Curl with Anchored Resistance  - 1 x daily - 5 x weekly - 2 sets - 10 reps - Marching with Resistance  - 1 x daily - 5 x weekly - 2 sets - 10 reps - Squat with Chair Touch  - 1 x daily - 5 x weekly - 2 sets - 10 reps - Heel Raises with Counter Support  - 1 x daily - 5 x weekly - 2 sets - 10 reps   GOALS: Goals reviewed with patient? Yes  SHORT TERM GOALS: Target date: 12/27/2022  Patient to be independent with initial HEP. Baseline: HEP initiated Goal status: MET 12/24/22    LONG TERM GOALS: Target date: 01/17/2023  Patient to be independent with advanced HEP. Baseline: Not yet initiated  Goal status: IN PROGRESS  Patient to demonstrate B LE strength >/=4+/5.  Baseline: See above Goal status: IN PROGRESS  Patient to demonstrate alternating reciprocal pattern when ascending and descending stairs with good stability and no  handrail.  Baseline: requires handrail Goal status: IN PROGRESS  Patient to score at least 26/30 on FGA in order to decrease risk of falls.  Baseline: 23/30 Goal status: IN PROGRESS  Patient to demonstrate symmetrical step length and knee flexion during swing phase with ambulation without AD with good stability.  Baseline: unable Goal status: IN PROGRESS  Patient to return to exercise regimen with modifications as needed.  Baseline: not initiated Goal status: IN PROGRESS    ASSESSMENT:  CLINICAL IMPRESSION: Initiated with gross motor strength/control for LLE and then performance of LUE activities for stabilization and recruitment per request for gym routine ideas w/ focus on weightbearing and compound movements w/ modifcation to adpat lack of left hand grip. Balance and isolation activities for BLE to improve weight shift and narrow BOS to good effect with unsteadiness during tandem. Continued sessions to advance POC details OBJECTIVE IMPAIRMENTS: Abnormal gait, decreased activity tolerance, decreased balance, decreased endurance, difficulty walking, decreased ROM, decreased strength, improper body mechanics, postural dysfunction, and pain.   ACTIVITY LIMITATIONS: carrying, lifting, standing, squatting, stairs, transfers, bathing, toileting, dressing, reach over head, hygiene/grooming, locomotion level, and caring for others  PARTICIPATION LIMITATIONS: meal prep, cleaning, laundry, driving, shopping, community activity, occupation, yard work, and church  PERSONAL FACTORS: Age, Fitness, Past/current experiences, Time since onset of injury/illness/exacerbation, and 3+ comorbidities: CKD, GERD, HTN, subdural hematoma 1991, back surgery 2019, L THA 2023, C3-6 ACDF 11/12/22  are also affecting patient's functional outcome.   REHAB POTENTIAL: Good  CLINICAL DECISION MAKING: Evolving/moderate complexity  EVALUATION COMPLEXITY: Moderate  PLAN:  PT FREQUENCY: 1-2x/week  PT DURATION: 6  weeks  PLANNED INTERVENTIONS: Therapeutic exercises, Therapeutic activity, Neuromuscular re-education, Balance training, Gait training, Patient/Family education, Self Care, Joint mobilization, Stair training, Vestibular training, Canalith repositioning, Aquatic Therapy, Dry Needling, Electrical stimulation, Cryotherapy, Moist heat, Taping, Manual therapy, and Re-evaluation  PLAN FOR NEXT SESSION: Work towards LTGs for strength, balance, gait (pt is feeling arm is most significant problem and he is returning to work-discuss possibility of going down in frequency for PT?)  11:49 AM, 01/09/23 M. Shary Decamp, PT, DPT Physical Therapist- [place] Office Number: [phone]

## 2023-01-14 ENCOUNTER — Encounter: Payer: Self-pay | Admitting: Physical Therapy

## 2023-01-14 ENCOUNTER — Ambulatory Visit: Payer: Commercial Managed Care - PPO | Admitting: Occupational Therapy

## 2023-01-14 ENCOUNTER — Ambulatory Visit: Payer: Commercial Managed Care - PPO | Admitting: Physical Therapy

## 2023-01-14 DIAGNOSIS — R2681 Unsteadiness on feet: Secondary | ICD-10-CM | POA: Diagnosis not present

## 2023-01-14 DIAGNOSIS — G959 Disease of spinal cord, unspecified: Secondary | ICD-10-CM | POA: Diagnosis not present

## 2023-01-14 DIAGNOSIS — M79642 Pain in left hand: Secondary | ICD-10-CM | POA: Diagnosis not present

## 2023-01-14 DIAGNOSIS — M6281 Muscle weakness (generalized): Secondary | ICD-10-CM | POA: Diagnosis not present

## 2023-01-14 DIAGNOSIS — R29818 Other symptoms and signs involving the nervous system: Secondary | ICD-10-CM | POA: Diagnosis not present

## 2023-01-14 DIAGNOSIS — R2689 Other abnormalities of gait and mobility: Secondary | ICD-10-CM

## 2023-01-14 DIAGNOSIS — R208 Other disturbances of skin sensation: Secondary | ICD-10-CM | POA: Diagnosis not present

## 2023-01-14 DIAGNOSIS — M79602 Pain in left arm: Secondary | ICD-10-CM | POA: Diagnosis not present

## 2023-01-14 DIAGNOSIS — R278 Other lack of coordination: Secondary | ICD-10-CM | POA: Diagnosis not present

## 2023-01-14 NOTE — Therapy (Signed)
OUTPATIENT PHYSICAL THERAPY NEURO TREATMENT   Patient Name: Johnny Robinson MRN: [id number] DOB:07-11-1966, 57 y.o., male Today's Date: 01/14/2023   PCP:  Marguarite Arbour, MD   REFERRING PROVIDER: Jacquelynn Cree, PA-C  END OF SESSION:  PT End of Session - 01/14/23 0933     Visit Number 10    Number of Visits 13    Date for PT Re-Evaluation 01/17/23    Authorization Type [facility] [employer]    Authorization - Visit Number 10    Authorization - Number of Visits 25    PT Start Time 0935    PT Stop Time 1013    PT Time Calculation (min) 38 min    Equipment Utilized During Treatment --    Activity Tolerance Patient tolerated treatment well    Behavior During Therapy [facility] for tasks assessed/performed                Past Medical History:  Diagnosis Date   Arthritis    Chronic kidney disease 2016   RENAL INFARCT   GERD (gastroesophageal reflux disease)    Hypertension    Reflux    Subdural hematoma (HCC) 1991   Past Surgical History:  Procedure Laterality Date   BACK SURGERY  11/2017   burr hole  1992   to evacuate [facility]   COLONOSCOPY WITH PROPOFOL N/A 09/02/2018   Procedure: COLONOSCOPY WITH PROPOFOL;  Surgeon: Midge Minium, MD;  Location: [facility];  Service: Endoscopy;  Laterality: N/A;   EXCISION MASS ABDOMINAL Left 05/28/2018   Procedure: EXCISIONAL BIOPSY MASS ABDOMINAL WALL;  Surgeon: Carolan Shiver, MD;  Location: [facility] ORS;  Service: General;  Laterality: Left;   PERIPHERAL VASCULAR CATHETERIZATION Right 05/18/2015   Procedure: Renal Angiography;  Surgeon: Annice Needy, MD;  Location: [facility] INVASIVE CV LAB;  Service: Cardiovascular;  Laterality: Right;   PERIPHERAL VASCULAR CATHETERIZATION Bilateral 05/18/2015   Procedure: Renal Intervention;  Surgeon: Annice Needy, MD;  Location: [facility] INVASIVE CV LAB;  Service: Cardiovascular;  Laterality: Bilateral;   TOTAL HIP ARTHROPLASTY Left 03/07/2022   Procedure: TOTAL HIP ARTHROPLASTY ANTERIOR APPROACH;  Surgeon:  Ollen Gross, MD;  Location: [facility] ORS;  Service: Orthopedics;  Laterality: Left;   Patient Active Problem List   Diagnosis Date Noted   Nerve pain 12/31/2022   Left shoulder pain 11/30/2022   Urinary hesitancy 11/30/2022   Constipation 11/30/2022   Biceps tendinitis of left shoulder 11/30/2022   Multifocal pneumonia 11/30/2022   Chronic pain syndrome 11/21/2022   HNP (herniated nucleus pulposus) with myelopathy, cervical 11/15/2022   Cervical myelopathy (HCC) 11/13/2022   OA (osteoarthritis) of hip 03/07/2022   Primary osteoarthritis of left hip 03/07/2022   GERD (gastroesophageal reflux disease) 11/27/2021   Encounter for screening colonoscopy    Benign neoplasm of rectosigmoid junction    Hyperlipidemia 12/18/2017   Essential hypertension 12/17/2016   Chest pain 05/30/2015   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 Have gotten back to gym-stationary bike, machine weights, rowing machine.  Pt accompanied by: self  PERTINENT HISTORY: CKD, GERD, HTN, subdural hematoma 1991, back surgery 2019, L THA 2023  PAIN:  Are you having pain? Yes: NPRS scale: 3/10 Pain location: L arm Pain description: ache Aggravating factors: raising arm Relieving factors: meds  PRECAUTIONS: Cervical and Fall  WEIGHT BEARING RESTRICTIONS: No  FALLS: Has patient fallen in last 6 months? No  LIVING ENVIRONMENT: Lives with: lives with their  spouse Lives in: House/apartment Stairs:  4 steps to enter without handrail; 3 story home  Has following equipment at home: Counselling psychologist, Environmental consultant - 2 wheeled, Tour manager, and Grab bars  PLOF: Independent, Vocation/Vocational requirements: standing, walking, some desk work, and Leisure: tennis (has not done this since THA 2023)  PATIENT GOALS: return to tennis and fishing   OBJECTIVE:    TODAY'S TREATMENT: 01/14/2023 Activity Comments  Ankle exercises:  stagger stance heel raises for plantarflexion 2 x 10 reps   Standing hip flexion, 3 x 10 reps LLE, 5#   Standing hip abduction 3 x 10 Standing hip extension 3 x 10 5#  FGA:  27/30 Improved from 23/30  MMT-see below for LLE strength measures   Resisted forward gait 8 reps, 35>30#, then resisted sidestepping, 25#>20# Cues for posture   Access Code: [email or web address] URL: [email or web address] Date: 01/14/2023 Prepared by: [facility] - Outpatient  Rehab - [facility]  Program Notes Stagger foot position with Left foot in back:  heel raises 2-3 sets of 10 reps  Exercises - Seated Hamstring Curl with Anchored Resistance  - 1 x daily - 5 x weekly - 2 sets - 10 reps - Marching with Resistance  - 1 x daily - 5 x weekly - 2 sets - 10 reps - Single Leg Bridge  - 1 x daily - 5 x weekly - 2 sets - 10 reps - Single Leg Squat with Chair Touch  - 1 x daily - 5 x weekly - 2 sets - 8 reps - Forward Step Down  - 1 x daily - 5 x weekly - 2 sets - 10 reps - Standing Gastroc Stretch on Foam 1/2 Roll  - 1 x daily - 5 x weekly - 2 sets - 30 sec hold - Alternating Heel Raises  - 1 x daily - 5 x weekly - 2 sets - 20 reps - Standing Hip Abduction with Counter Support  - 1 x daily - 5 x weekly - 3 sets - 10 reps - Standing Hip Extension with Counter Support  - 1 x daily - 5 x weekly - 3 sets - 10 reps   [facility] PT Assessment - 01/14/23 0001       Functional Gait  Assessment   Gait assessed  Yes    Gait Level Surface Walks 20 ft in less  than 5.5 sec, no assistive devices, good speed, no evidence for imbalance, normal gait pattern, deviates no more than 6 in outside of the 12 in walkway width.    Change in Gait Speed Able to change speed, demonstrates mild gait deviations, deviates 6-10 in outside of the 12 in walkway width, or no gait deviations, unable to achieve a major change in velocity, or uses a change in velocity, or uses an assistive device.    Gait with Horizontal Head Turns Performs head turns smoothly with slight change in gait velocity (eg, minor disruption to smooth  gait path), deviates 6-10 in outside 12 in walkway width, or uses an assistive device.    Gait with Vertical Head Turns Performs task with slight change in gait velocity (eg, minor disruption to smooth gait path), deviates 6 - 10 in outside 12 in walkway width or uses assistive device    Gait and Pivot Turn Pivot turns safely within 3 sec and stops quickly with no loss of balance.    Step Over Obstacle Is able to step over 2 stacked shoe boxes taped together (9 in total height) without changing gait speed. No evidence of imbalance.    Gait with Narrow Base of Support Is able to ambulate for 10 steps heel to toe with no staggering.    Gait with Eyes Closed Walks 20 ft, no assistive devices, good speed, no evidence of imbalance, normal gait pattern, deviates no more than 6 in outside 12 in walkway width. Ambulates 20 ft in less than 7 sec.    Ambulating Backwards Walks 20 ft, no assistive devices, good speed, no evidence for imbalance, normal gait    Steps Alternating feet, no rail.    Total Score 27    FGA comment: improved from 23/30               PATIENT EDUCATION: Education details: HEP updates Person educated: Patient Education method: Explanation, Demonstration, and Handouts Education comprehension: verbalized understanding and returned demonstration       Below measures were taken at time of initial evaluation unless otherwise  specified:    DIAGNOSTIC FINDINGS: 11/13/22 cervical MRI:  Postoperative changes from prior ACDF at C3 through C6 without significant residual spinal stenosis.Patchy signal abnormality involving the central and left cord at the level of C5-6, concerning for contusion/injury.  11/23/22 cervical xray: Prior ACDF from C3-C6. Intact hardware without evidence of loosening. Persistent prevertebral soft tissue swelling.Mild degenerative disc disease at C6-C7. Mild to moderate multilevel facet arthropathy.  COGNITION: Overall cognitive status: Within functional limits for tasks assessed   SENSATION: WFL  COORDINATION: Alternating pronation/supination: limited ROM on L and slow Alternating toe tap: intact B Finger to nose: unable on L d/t weakness, R UE intact Heel to shin: limited ROM on L d/t weakness    MUSCLE TONE: intact B LEs   POSTURE: rounded shoulders and flexed trunk   LOWER EXTREMITY ROM:     Active  Right Eval Left Eval  Hip flexion    Hip extension    Hip abduction    Hip adduction    Hip internal rotation    Hip external rotation    Knee flexion    Knee extension    Ankle dorsiflexion 20 3  Ankle plantarflexion    Ankle inversion    Ankle eversion     (Blank rows = not tested)  LOWER EXTREMITY MMT:    MMT (in sitting) Right Eval Left Eval Left 01/14/2023  Hip flexion 4+ 4- 4  Hip extension     Hip abduction 4 4- 4+  Hip adduction 4 4- 4+  Hip internal rotation     Hip external rotation     Knee flexion 5 4- 4+  Knee extension 5 4 4+  Ankle dorsiflexion 4+ 4 4+  Ankle plantarflexion 4 4- 4  Ankle inversion   4  Ankle eversion   4  (Blank rows = not tested)   GAIT: Gait pattern: Decreased R step length, slightly decreased L knee flexion during swing through, good foot clearance and step through pattern  Assistive device utilized: None Level of assistance: Modified independence   FUNCTIONAL TESTS:   [facility] PT Assessment - 01/14/23 0001        Functional Gait  Assessment   Gait assessed  Yes    Gait Level Surface Walks 20 ft in less than 5.5 sec, no assistive devices, good speed, no evidence for imbalance, normal gait pattern, deviates no more than 6 in outside of the 12 in walkway width.    Change in Gait Speed Able to change speed, demonstrates mild gait deviations, deviates 6-10 in outside of the 12 in walkway width, or no gait deviations, unable to achieve a major change in velocity, or uses a change in velocity, or uses an assistive device.    Gait with Horizontal Head Turns Performs head turns smoothly with slight change in gait velocity (eg, minor disruption to smooth gait path), deviates 6-10 in outside 12 in walkway width, or uses an assistive device.    Gait with Vertical Head Turns Performs task with slight change in gait velocity (eg, minor disruption to smooth gait path), deviates 6 - 10 in outside 12 in walkway width or uses assistive device    Gait and Pivot Turn Pivot turns safely within 3 sec and stops quickly with no loss of balance.    Step Over Obstacle Is able to step over 2 stacked shoe boxes taped together (9 in total height) without changing gait speed. No evidence of imbalance.    Gait with Narrow Base of Support Is able to ambulate for 10 steps heel to toe with no staggering.    Gait with Eyes Closed Walks 20 ft, no assistive devices, good speed, no evidence of imbalance, normal gait pattern, deviates no more than 6 in outside 12 in walkway width. Ambulates 20 ft in less than 7 sec.    Ambulating Backwards Walks 20 ft, no assistive devices, good speed, no evidence for imbalance, normal gait    Steps Alternating feet, no rail.    Total Score 27    FGA comment: improved from 23/30                 PATIENT EDUCATION: Education details: prognosis, POC, HEP Person educated: Patient Education method: Explanation, Demonstration, Tactile cues, Verbal cues, and Handouts Education comprehension: verbalized  understanding  HOME EXERCISE PROGRAM: Access Code: [email or web address] URL: [email or web address] Date: 12/06/2022 Prepared by: [facility] - Outpatient  Rehab - [facility]  Exercises - Seated Hamstring Curl with Anchored Resistance  - 1 x daily - 5 x weekly - 2 sets - 10 reps - Marching with Resistance  - 1 x daily - 5 x weekly - 2 sets - 10 reps - Squat with Chair Touch  - 1 x daily - 5 x weekly - 2 sets - 10 reps - Heel Raises with Counter Support  - 1 x daily - 5 x weekly - 2 sets - 10 reps   GOALS: Goals reviewed with patient? Yes  SHORT TERM GOALS: Target date: 12/27/2022  Patient to be independent with initial HEP. Baseline: HEP initiated Goal status: MET 12/24/22    LONG TERM GOALS: Target date: 01/17/2023  Patient to be independent with advanced HEP. Baseline: Not yet initiated  Goal status: IN PROGRESS  Patient to demonstrate B LE strength >/=4+/5.  Baseline: See above Goal status: PARTIALLY MET  Patient to demonstrate alternating reciprocal pattern when ascending and descending stairs with good stability and no handrail.  Baseline: requires handrail Goal status: MET  Patient  to score at least 26/30 on FGA in order to decrease risk of falls.  Baseline: 23/30>27/30 01/14/2023 Goal status: MET  Patient to demonstrate symmetrical step length and knee flexion during swing phase with ambulation without AD with good stability.  Baseline: unable Goal status: IN PROGRESS  Patient to return to exercise regimen with modifications as needed.  Baseline: not initiated Goal status: IN PROGRESS    ASSESSMENT:  CLINICAL IMPRESSION: Began assessing LTGs.  Pt has partially met LTG 2 for improved strength.  Pt progressing towards 4+/5, but not fully there through LLE.  LTG 3 and 4 met for improved stair negotiation and FGA.  Pt has returned to gym and is using machines.  He continues to note fatigue in LLE, which changes gait pattern; he does demo and verbalize  awareness of this.  He is on target to meet remaining goals with plan for discharge next visit. OBJECTIVE IMPAIRMENTS: Abnormal gait, decreased activity tolerance, decreased balance, decreased endurance, difficulty walking, decreased ROM, decreased strength, improper body mechanics, postural dysfunction, and pain.   ACTIVITY LIMITATIONS: carrying, lifting, standing, squatting, stairs, transfers, bathing, toileting, dressing, reach over head, hygiene/grooming, locomotion level, and caring for others  PARTICIPATION LIMITATIONS: meal prep, cleaning, laundry, driving, shopping, community activity, occupation, yard work, and church  PERSONAL FACTORS: Age, Fitness, Past/current experiences, Time since onset of injury/illness/exacerbation, and 3+ comorbidities: CKD, GERD, HTN, subdural hematoma 1991, back surgery 2019, L THA 2023, C3-6 ACDF 11/12/22  are also affecting patient's functional outcome.   REHAB POTENTIAL: Good  CLINICAL DECISION MAKING: Evolving/moderate complexity  EVALUATION COMPLEXITY: Moderate  PLAN:  PT FREQUENCY: 1-2x/week  PT DURATION: 6 weeks  PLANNED INTERVENTIONS: Therapeutic exercises, Therapeutic activity, Neuromuscular re-education, Balance training, Gait training, Patient/Family education, Self Care, Joint mobilization, Stair training, Vestibular training, Canalith repositioning, Aquatic Therapy, Dry Needling, Electrical stimulation, Cryotherapy, Moist heat, Taping, Manual therapy, and Re-evaluation  PLAN FOR NEXT SESSION: Check remaining LTGs and plan for discharge.    Lonia Blood, PT 01/14/23 10:19 AM Phone: [phone] Fax: [phone]  [facility] at [facility] [address] [place], [address] [place], [place] [address] Phone # [phone] Fax # [phone]

## 2023-01-16 ENCOUNTER — Ambulatory Visit: Payer: Commercial Managed Care - PPO | Admitting: Occupational Therapy

## 2023-01-16 ENCOUNTER — Other Ambulatory Visit (HOSPITAL_COMMUNITY): Payer: Self-pay

## 2023-01-16 ENCOUNTER — Ambulatory Visit: Payer: Commercial Managed Care - PPO | Attending: Physical Medicine and Rehabilitation

## 2023-01-16 DIAGNOSIS — R278 Other lack of coordination: Secondary | ICD-10-CM

## 2023-01-16 DIAGNOSIS — R2689 Other abnormalities of gait and mobility: Secondary | ICD-10-CM | POA: Insufficient documentation

## 2023-01-16 DIAGNOSIS — M6281 Muscle weakness (generalized): Secondary | ICD-10-CM

## 2023-01-16 DIAGNOSIS — M79642 Pain in left hand: Secondary | ICD-10-CM | POA: Diagnosis not present

## 2023-01-16 DIAGNOSIS — M79602 Pain in left arm: Secondary | ICD-10-CM | POA: Insufficient documentation

## 2023-01-16 DIAGNOSIS — R208 Other disturbances of skin sensation: Secondary | ICD-10-CM | POA: Diagnosis not present

## 2023-01-16 DIAGNOSIS — R2681 Unsteadiness on feet: Secondary | ICD-10-CM

## 2023-01-16 DIAGNOSIS — M5412 Radiculopathy, cervical region: Secondary | ICD-10-CM | POA: Diagnosis not present

## 2023-01-16 NOTE — Therapy (Signed)
OUTPATIENT PHYSICAL THERAPY NEURO TREATMENT and D/C Summary   Patient Name: Johnny Robinson MRN: [id number] DOB:May 08, 1966, 57 y.o., male Today's Date: 01/16/2023   PCP:  Marguarite Arbour, MD REFERRING PROVIDER: Jacquelynn Cree, PA-C  PHYSICAL THERAPY DISCHARGE SUMMARY  Visits from Start of Care: 11  Current functional level related to goals / functional outcomes: Patient has met or partially met STG/LTG and demo improved status per score of FGA   Remaining deficits: Some residual weakness in LLE and LUE    Education / Equipment: HEP and gym routine structure   Patient agrees to discharge. Patient goals were partially met. Patient is being discharged due to being pleased with the current functional level.   END OF SESSION:  PT End of Session - 01/16/23 0924     Visit Number 11    Number of Visits 13    Date for PT Re-Evaluation 01/17/23    Authorization Type [facility] [employer]    Authorization - Visit Number 11    Authorization - Number of Visits 25    PT Start Time 0930    PT Stop Time 1015    PT Time Calculation (min) 45 min    Activity Tolerance Patient tolerated treatment well    Behavior During Therapy [facility] for tasks assessed/performed                Past Medical History:  Diagnosis Date   Arthritis    Chronic kidney disease 2016   RENAL INFARCT   GERD (gastroesophageal reflux disease)    Hypertension    Reflux    Subdural hematoma (HCC) 1991   Past Surgical History:  Procedure Laterality Date   BACK SURGERY  11/2017   burr hole  1992   to evacuate SDH   COLONOSCOPY WITH PROPOFOL N/A 09/02/2018   Procedure: COLONOSCOPY WITH PROPOFOL;  Surgeon: Midge Minium, MD;  Location: [facility] ENDOSCOPY;  Service: Endoscopy;  Laterality: N/A;   EXCISION MASS ABDOMINAL Left 05/28/2018   Procedure: EXCISIONAL BIOPSY MASS ABDOMINAL WALL;  Surgeon: Carolan Shiver, MD;  Location: [facility] ORS;  Service: General;  Laterality: Left;   PERIPHERAL VASCULAR CATHETERIZATION Right  05/18/2015   Procedure: Renal Angiography;  Surgeon: Annice Needy, MD;  Location: [facility] INVASIVE CV LAB;  Service: Cardiovascular;  Laterality: Right;   PERIPHERAL VASCULAR CATHETERIZATION Bilateral 05/18/2015   Procedure: Renal Intervention;  Surgeon: Annice Needy, MD;  Location: [facility] INVASIVE CV LAB;  Service: Cardiovascular;  Laterality: Bilateral;   TOTAL HIP ARTHROPLASTY Left 03/07/2022   Procedure: TOTAL HIP ARTHROPLASTY ANTERIOR APPROACH;  Surgeon: Ollen Gross, MD;  Location: [facility] ORS;  Service: Orthopedics;  Laterality: Left;   Patient Active Problem List   Diagnosis Date Noted   Nerve pain 12/31/2022   Left shoulder pain 11/30/2022   Urinary hesitancy 11/30/2022   Constipation 11/30/2022   Biceps tendinitis of left shoulder 11/30/2022   Multifocal pneumonia 11/30/2022   Chronic pain syndrome 11/21/2022   HNP (herniated nucleus pulposus) with myelopathy, cervical 11/15/2022   Cervical myelopathy (HCC) 11/13/2022   OA (osteoarthritis) of hip 03/07/2022   Primary osteoarthritis of left hip 03/07/2022   GERD (gastroesophageal reflux disease) 11/27/2021   Encounter for screening colonoscopy    Benign neoplasm of rectosigmoid junction    Hyperlipidemia 12/18/2017   Essential hypertension 12/17/2016   Chest pain 05/30/2015   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    ONSET DATE: 11/13/22  REFERRING DIAG: G95.9 (ICD-10-CM) - Cervical myelopathy (HCC)  THERAPY  DIAG:  Muscle weakness (generalized)  Unsteadiness on feet  Other abnormalities of gait and mobility  Rationale for Evaluation and Treatment: Rehabilitation  SUBJECTIVE:                                                                                                                                                                                             SUBJECTIVE STATEMENT: Joined the gym for exercise   Pt accompanied by: self  PERTINENT HISTORY: CKD, GERD, HTN, subdural  hematoma 1991, back surgery 2019, L THA 2023  PAIN:  Are you having pain? Yes: NPRS scale: 3/10 Pain location: L arm Pain description: ache Aggravating factors: raising arm Relieving factors: meds  PRECAUTIONS: Cervical and Fall  WEIGHT BEARING RESTRICTIONS: No  FALLS: Has patient fallen in last 6 months? No  LIVING ENVIRONMENT: Lives with: lives with their spouse Lives in: House/apartment Stairs:  4 steps to enter without handrail; 3 story home  Has following equipment at home: Counselling psychologist, Environmental consultant - 2 wheeled, Tour manager, and Grab bars  PLOF: Independent, Vocation/Vocational requirements: standing, walking, some desk work, and Leisure: tennis (has not done this since THA 2023)  PATIENT GOALS: return to tennis and fishing   OBJECTIVE:   TODAY'S TREATMENT: 01/16/23 Activity Comments  Gym routine ideas -suitcase carry -gym machine modifications  LE strength -forwrad step-ups -lateral step downs  Single leg calf raise 3x10 Foot elevated on BOSU for support  gait Over various surfaces independent  Balance training Unstable and compliant surfaces        TODAY'S TREATMENT: 01/14/2023 Activity Comments  Ankle exercises:  stagger stance heel raises for plantarflexion 2 x 10 reps   Standing hip flexion, 3 x 10 reps LLE, 5#   Standing hip abduction 3 x 10 Standing hip extension 3 x 10 5#  FGA:  27/30 Improved from 23/30  MMT-see below for LLE strength measures   Resisted forward gait 8 reps, 35>30#, then resisted sidestepping, 25#>20# Cues for posture   Access Code: [email or web address] URL: [email or web address] Date: 01/14/2023 Prepared by: [facility] - Outpatient  Rehab - [facility]  Program Notes Stagger foot position with Left foot in back:  heel raises 2-3 sets of 10 reps  Exercises - Seated Hamstring Curl with Anchored Resistance  - 1 x daily - 5 x weekly - 2 sets - 10 reps - Marching with Resistance  - 1 x daily - 5 x weekly - 2 sets - 10  reps - Single Leg Bridge  - 1 x daily - 5 x weekly - 2 sets - 10 reps - Single Leg Squat with Chair Touch  -  1 x daily - 5 x weekly - 2 sets - 8 reps - Forward Step Down  - 1 x daily - 5 x weekly - 2 sets - 10 reps - Standing Gastroc Stretch on Foam 1/2 Roll  - 1 x daily - 5 x weekly - 2 sets - 30 sec hold - Alternating Heel Raises  - 1 x daily - 5 x weekly - 2 sets - 20 reps - Standing Hip Abduction with Counter Support  - 1 x daily - 5 x weekly - 3 sets - 10 reps - Standing Hip Extension with Counter Support  - 1 x daily - 5 x weekly - 3 sets - 10 reps       PATIENT EDUCATION: Education details: HEP updates Person educated: Patient Education method: Explanation, Demonstration, and Handouts Education comprehension: verbalized understanding and returned demonstration       Below measures were taken at time of initial evaluation unless otherwise specified:    DIAGNOSTIC FINDINGS: 11/13/22 cervical MRI:  Postoperative changes from prior ACDF at C3 through C6 without significant residual spinal stenosis.Patchy signal abnormality involving the central and left cord at the level of C5-6, concerning for contusion/injury.  11/23/22 cervical xray: Prior ACDF from C3-C6. Intact hardware without evidence of loosening. Persistent prevertebral soft tissue swelling.Mild degenerative disc disease at C6-C7. Mild to moderate multilevel facet arthropathy.  COGNITION: Overall cognitive status: Within functional limits for tasks assessed   SENSATION: WFL  COORDINATION: Alternating pronation/supination: limited ROM on L and slow Alternating toe tap: intact B Finger to nose: unable on L d/t weakness, R UE intact Heel to shin: limited ROM on L d/t weakness    MUSCLE TONE: intact B LEs   POSTURE: rounded shoulders and flexed trunk   LOWER EXTREMITY ROM:     Active  Right Eval Left Eval  Hip flexion    Hip extension    Hip abduction    Hip adduction    Hip internal rotation     Hip external rotation    Knee flexion    Knee extension    Ankle dorsiflexion 20 3  Ankle plantarflexion    Ankle inversion    Ankle eversion     (Blank rows = not tested)  LOWER EXTREMITY MMT:    MMT (in sitting) Right Eval Left Eval Left 01/14/2023  Hip flexion 4+ 4- 4  Hip extension     Hip abduction 4 4- 4+  Hip adduction 4 4- 4+  Hip internal rotation     Hip external rotation     Knee flexion 5 4- 4+  Knee extension 5 4 4+  Ankle dorsiflexion 4+ 4 4+  Ankle plantarflexion 4 4- 4  Ankle inversion   4  Ankle eversion   4  (Blank rows = not tested)   GAIT: Gait pattern: Decreased R step length, slightly decreased L knee flexion during swing through, good foot clearance and step through pattern Assistive device utilized: None Level of assistance: Modified independence   FUNCTIONAL TESTS:         PATIENT EDUCATION: Education details: prognosis, POC, HEP Person educated: Patient Education method: Explanation, Demonstration, Tactile cues, Verbal cues, and Handouts Education comprehension: verbalized understanding  HOME EXERCISE PROGRAM: Access Code: [email or web address] URL: [email or web address] Date: 12/06/2022 Prepared by: [facility] - Outpatient  Rehab - [facility]  Exercises - Seated Hamstring Curl with Anchored Resistance  - 1 x daily - 5 x weekly - 2 sets - 10 reps - Marching with Resistance  -  1 x daily - 5 x weekly - 2 sets - 10 reps - Squat with Chair Touch  - 1 x daily - 5 x weekly - 2 sets - 10 reps - Heel Raises with Counter Support  - 1 x daily - 5 x weekly - 2 sets - 10 reps   GOALS: Goals reviewed with patient? Yes  SHORT TERM GOALS: Target date: 12/27/2022  Patient to be independent with initial HEP. Baseline: HEP initiated Goal status: MET 12/24/22    LONG TERM GOALS: Target date: 01/17/2023  Patient to be independent with advanced HEP. Baseline: Not yet initiated  Goal status: MET  Patient to demonstrate B LE  strength >/=4+/5.  Baseline: See above Goal status: PARTIALLY MET  Patient to demonstrate alternating reciprocal pattern when ascending and descending stairs with good stability and no handrail.  Baseline: requires handrail Goal status: MET  Patient to score at least 26/30 on FGA in order to decrease risk of falls.  Baseline: 23/30>27/30 01/14/2023 Goal status: MET  Patient to demonstrate symmetrical step length and knee flexion during swing phase with ambulation without AD with good stability.  Baseline: unable Goal status: MET  Patient to return to exercise regimen with modifications as needed.  Baseline: not initiated Goal status: MET    ASSESSMENT:  CLINICAL IMPRESSION: Pt able to demo independence in functional mobility over various surfaces and demands.  LLE function has overall improved very well with some residual weakness in left dorsiflexion/plantarflexon, knee extension, and hip flexion.  Pt has been very disciplined in his activity and demonstrates independence in HEP recall and fitness facility routine.. Demonsrtaetd relevant modifications for gym-based activities for strength as well as explanation of structure.  Patient feels comfortable in self-mgmt at this time and will D/C to HEP OBJECTIVE IMPAIRMENTS: Abnormal gait, decreased activity tolerance, decreased balance, decreased endurance, difficulty walking, decreased ROM, decreased strength, improper body mechanics, postural dysfunction, and pain.   ACTIVITY LIMITATIONS: carrying, lifting, standing, squatting, stairs, transfers, bathing, toileting, dressing, reach over head, hygiene/grooming, locomotion level, and caring for others  PARTICIPATION LIMITATIONS: meal prep, cleaning, laundry, driving, shopping, community activity, occupation, yard work, and church  PERSONAL FACTORS: Age, Fitness, Past/current experiences, Time since onset of injury/illness/exacerbation, and 3+ comorbidities: CKD, GERD, HTN, subdural  hematoma 1991, back surgery 2019, L THA 2023, C3-6 ACDF 11/12/22  are also affecting patient's functional outcome.   REHAB POTENTIAL: Good  CLINICAL DECISION MAKING: Evolving/moderate complexity  EVALUATION COMPLEXITY: Moderate  PLAN:  PT FREQUENCY: 1-2x/week  PT DURATION: 6 weeks  PLANNED INTERVENTIONS: Therapeutic exercises, Therapeutic activity, Neuromuscular re-education, Balance training, Gait training, Patient/Family education, Self Care, Joint mobilization, Stair training, Vestibular training, Canalith repositioning, Aquatic Therapy, Dry Needling, Electrical stimulation, Cryotherapy, Moist heat, Taping, Manual therapy, and Re-evaluation  PLAN FOR NEXT SESSION: Check remaining LTGs and plan for discharge.    10:13 AM, 01/16/23 M. Shary Decamp, PT, DPT Physical Therapist- [place] Office Number: [phone]

## 2023-01-16 NOTE — Progress Notes (Unsigned)
[facility] [address] [place] [address] Phone: [phone] Subjective:    I'm seeing this patient by the request  of:  Marguarite Arbour, MD  CC: Neck and left shoulder pain follow-up  [id number]  12/06/2022 Patient does have significant left-sided weakness that is likely from the cord contusion and the postsurgical changes.  We discussed with patient as well as significant other that this can be consistent with the injury.  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  Patient states that initially also had significant weakness noted of the left lower extremity.  Some likely that seem to come back much quicker he states.  Patient has been working with occupational therapy and physical therapy and is making progress.  We did discuss with patient having this on the left side and with the C7 distribution as well and what is appears to be some mild pronator drift that we should consider the possibility of an MRI of the brain just to make sure that there is no other ischemic episode that could have occurred.  In addition of this because of it being more of the C7 and T1 also seem to be part of the weakness I would think that and potentially a thoracic MRI would also be beneficial to make sure that there is no resolving hematoma that could be contributing.  We discussed that both of these would not make any significant changes of the management other than preventative care if there was any type of CVA.  Patient and caregiver though do want to further evaluate to make sure that we are doing everything appropriately.  Will get the imaging to further evaluate encourage patient to continue to go to occupational therapy as well as physical therapy.  Patient would like to get off the oxycodone and will increase gabapentin to 300 mg at night as well as Cymbalta 20 mg during the day.  Warned of potential side effects.  Follow-up  with me again after imaging to discuss further.  Total time reviewing patient's records as well as discussing with patient and significant other 51 minutes     Update 01/17/2023 Johnny Robinson is a 57 y.o. male coming in with complaint of L shoulder and neck pain. Patient states continues to make some improvement at the moment.  Happy with the exam so far.  Patient is off the oxycodone.  Patient is accompanied with wife and states that the pain seems to be much more under control but with the Cymbalta possible sexual dysfunction noted.     Past Medical History:  Diagnosis Date   Arthritis    Chronic kidney disease 2016   RENAL INFARCT   GERD (gastroesophageal reflux disease)    Hypertension    Reflux    Subdural hematoma (HCC) 1991   Past Surgical History:  Procedure Laterality Date   BACK SURGERY  11/2017   burr hole  1992   to evacuate SDH   COLONOSCOPY WITH PROPOFOL N/A 09/02/2018   Procedure: COLONOSCOPY WITH PROPOFOL;  Surgeon: Midge Minium, MD;  Location: [facility] ENDOSCOPY;  Service: Endoscopy;  Laterality: N/A;   EXCISION MASS ABDOMINAL Left 05/28/2018   Procedure: EXCISIONAL BIOPSY MASS ABDOMINAL WALL;  Surgeon: Carolan Shiver, MD;  Location: [facility] ORS;  Service: General;  Laterality: Left;   PERIPHERAL VASCULAR CATHETERIZATION Right 05/18/2015   Procedure: Renal Angiography;  Surgeon: Annice Needy, MD;  Location: [facility] INVASIVE CV LAB;  Service: Cardiovascular;  Laterality: Right;   PERIPHERAL VASCULAR CATHETERIZATION Bilateral 05/18/2015   Procedure: Renal Intervention;  Surgeon: Annice Needy, MD;  Location: [facility] INVASIVE CV LAB;  Service: Cardiovascular;  Laterality: Bilateral;   TOTAL HIP ARTHROPLASTY Left 03/07/2022   Procedure: TOTAL HIP ARTHROPLASTY ANTERIOR APPROACH;  Surgeon: Ollen Gross, MD;  Location: [facility] ORS;  Service: Orthopedics;  Laterality: Left;   Social History   Socioeconomic History   Marital status: Married    Spouse name: Carollee Herter   Number of children:  3   Years of education: JD   Highest education level: Professional school degree (e.g., MD, DDS, DVM, JD)  Occupational History   Occupation: Clinical research associate  Tobacco Use   Smoking status: Never   Smokeless tobacco: Never  Vaping Use   Vaping Use: Never used  Substance and Sexual Activity   Alcohol use: Yes    Comment: occasional   Drug use: No   Sexual activity: Yes    Birth control/protection: None  Other Topics Concern   Not on file  Social History Narrative   Not on file   Social Determinants of Health   Financial Resource Strain: Not on file  Food Insecurity: No Food Insecurity (11/13/2022)   Hunger Vital Sign    Worried About Running Out of Food in the Last Year: Never true    Ran Out of Food in the Last Year: Never true  Transportation Needs: No Transportation Needs (11/13/2022)   PRAPARE - Administrator, Civil Service (Medical): No    Lack of Transportation (Non-Medical): No  Physical Activity: Not on file  Stress: Not on file  Social Connections: Not on file   No Known Allergies Family History  Problem Relation Age of Onset   Nephrolithiasis Mother    Cancer Father      Current Outpatient Medications (Cardiovascular):    atorvastatin (LIPITOR) 20 MG tablet, Take 1 tablet (20 mg total) by mouth daily.   metoprolol succinate (TOPROL-XL) 25 MG 24 hr tablet, Take 1 tablet (25 mg total) by mouth once daily   Current Outpatient Medications (Analgesics):    aspirin EC 81 MG tablet, Take 81 mg by mouth daily.   HYDROcodone-acetaminophen (NORCO) 10-325 MG tablet, Take 1 tablet by mouth every 6 (six) hours as needed.   Current Outpatient Medications (Other):    cyclobenzaprine (FLEXERIL) 5 MG tablet, Take 1 tablet (5 mg total) by mouth 3 (three) times daily as needed for muscle spasms.   diclofenac Sodium (VOLTAREN) 1 % GEL, Apply 4 g topically 4 (four) times daily.   DULoxetine (CYMBALTA) 20 MG capsule, Take 1 capsule by mouth daily.   DULoxetine (CYMBALTA)  20 MG capsule, Take 1 capsule (20 mg total) by mouth daily.   gabapentin (NEURONTIN) 100 MG capsule, Take by mouth. (Patient not taking: Reported on 12/31/2022)   gabapentin (NEURONTIN) 300 MG capsule, Take 1 capsule (300 mg total) by mouth 3 (three) times daily.   Multiple Vitamin (MULTIVITAMIN WITH MINERALS) TABS tablet, Take 1 tablet by mouth in the morning.   omeprazole (PRILOSEC) 40 MG capsule, TAKE ONE CAPSULE BY MOUTH EVERY MORNING (Patient taking differently: Take 40 mg by mouth daily.)   senna-docusate (SENEXON-S) 8.6-50 MG tablet, Take 2 tablets by mouth daily with supper.   terbinafine (LAMISIL) 250 MG tablet, Take 1 tablet (250 mg total) by mouth daily.   Reviewed prior external information including notes and imaging from  primary care provider As well as notes that were available from care everywhere and other  healthcare systems.  Past medical history, social, surgical and family history all reviewed in electronic medical record.  No pertanent information unless stated regarding to the chief complaint.   Review of Systems:  No headache, visual changes, nausea, vomiting, diarrhea, constipation, dizziness, abdominal pain, skin rash, fevers, chills, night sweats, weight loss, swollen lymph nodes, body aches, joint swelling, chest pain, shortness of breath, mood changes. POSITIVE muscle aches  Objective  Blood pressure 104/70, pulse 75, height 6\' 1"  (1.854 m), weight 183 lb (83 kg), SpO2 96 %.   General: No apparent distress alert and oriented x3 mood and affect normal, dressed appropriately.  HEENT: Pupils equal, extraocular movements intact  Respiratory: Patient's speak in full sentences and does not appear short of breath  Cardiovascular: No lower extremity edema, non tender, no erythema  Patient does still have weakness of the left upper extremity and some over the lower extremity.  Patient still has some limited sidebending of the neck bilaterally but does seem to be  significantly more comfortable and much more coherent.    Impression and Recommendations:    The above documentation has been reviewed and is accurate and complete Judi Saa, DO

## 2023-01-16 NOTE — Therapy (Signed)
OUTPATIENT OCCUPATIONAL THERAPY NEURO Treatment Session  Patient Name: Johnny Robinson MRN: [id number] DOB:July 26, 1966, 57 y.o., male Today's Date: 01/16/2023  PCP: Marguarite Arbour, MD REFERRING PROVIDER: Jacquelynn Cree, PA-C  END OF SESSION:  OT End of Session - 01/16/23 1025     Visit Number 10    Number of Visits 17    Date for OT Re-Evaluation 02/01/23    Authorization Type [employer] employee - [employer] PPO    OT Start Time 1020    OT Stop Time 1102    OT Time Calculation (min) 42 min                      Past Medical History:  Diagnosis Date   Arthritis    Chronic kidney disease 2016   RENAL INFARCT   GERD (gastroesophageal reflux disease)    Hypertension    Reflux    Subdural hematoma (HCC) 1991   Past Surgical History:  Procedure Laterality Date   BACK SURGERY  11/2017   burr hole  1992   to evacuate SDH   COLONOSCOPY WITH PROPOFOL N/A 09/02/2018   Procedure: COLONOSCOPY WITH PROPOFOL;  Surgeon: Midge Minium, MD;  Location: [facility];  Service: Endoscopy;  Laterality: N/A;   EXCISION MASS ABDOMINAL Left 05/28/2018   Procedure: EXCISIONAL BIOPSY MASS ABDOMINAL WALL;  Surgeon: Carolan Shiver, MD;  Location: [facility];  Service: General;  Laterality: Left;   PERIPHERAL VASCULAR CATHETERIZATION Right 05/18/2015   Procedure: Renal Angiography;  Surgeon: Annice Needy, MD;  Location: [facility];  Service: Cardiovascular;  Laterality: Right;   PERIPHERAL VASCULAR CATHETERIZATION Bilateral 05/18/2015   Procedure: Renal Intervention;  Surgeon: Annice Needy, MD;  Location: [facility];  Service: Cardiovascular;  Laterality: Bilateral;   TOTAL HIP ARTHROPLASTY Left 03/07/2022   Procedure: TOTAL HIP ARTHROPLASTY ANTERIOR APPROACH;  Surgeon: Ollen Gross, MD;  Location: [facility];  Service: Orthopedics;  Laterality: Left;   Patient Active Problem List   Diagnosis Date Noted   Nerve pain 12/31/2022   Left shoulder pain 11/30/2022   Urinary  hesitancy 11/30/2022   Constipation 11/30/2022   Biceps tendinitis of left shoulder 11/30/2022   Multifocal pneumonia 11/30/2022   Chronic pain syndrome 11/21/2022   HNP (herniated nucleus pulposus) with myelopathy, cervical 11/15/2022   Cervical myelopathy (HCC) 11/13/2022   OA (osteoarthritis) of hip 03/07/2022   Primary osteoarthritis of left hip 03/07/2022   GERD (gastroesophageal reflux disease) 11/27/2021   Encounter for screening colonoscopy    Benign neoplasm of rectosigmoid junction    Hyperlipidemia 12/18/2017   Essential hypertension 12/17/2016   Chest pain 05/30/2015   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 Dr. Jordan Likes expressed concerns of cord contusion due to worsening of myelopathy and MRI spine done which showed post op changes form ACDF C3-C6 and patchy signal abnormality involving the central and left cord at C5/6 concerning for contusion/injury.  PRECAUTIONS: Cervical and Other: lifting precautions not > 5#  WEIGHT BEARING  RESTRICTIONS: No  PAIN:  Are you having pain? No  FALLS: Has patient fallen in last 6 months? No  LIVING ENVIRONMENT: Lives with: lives with their spouse and 42 yo Lives in: House/apartment Stairs: Yes: Internal: full flight steps; on right going up and External: 4 steps; none Has following equipment at home: Quad cane small base, Walker - 2 wheeled, shower chair, bed side commode, and hand held shower head  PLOF: Independent, Independent with basic ADLs, and Vocation/Vocational requirements: practices law  PATIENT GOALS: pt would like to get arm progressing like leg   OBJECTIVE:   HAND DOMINANCE: Right  ADLs: Overall ADLs: requires increased time and thought to complete Transfers/ambulation related to ADLs: Mod I with SBQC Eating: requires assistance to cut food Grooming: Mod I UB Dressing: requiring assistance with buttons LB Dressing: requiring assistance with socks/tying shoes Toileting: Mod I Bathing: Mod I Tub Shower transfers: Mod I, has built in shower seat Equipment: Walk in shower  IADLs: Light housekeeping: has attempted vacuuming without issue Meal Prep: has attempted cooking, spouse or children assisting with retrieving items if >5# Medication management: spouse is filling pillbox and is reminding him to take  MOBILITY STATUS: Needs Assist: Mod I - Supervision with SBQC  POSTURE COMMENTS:  rounded shoulders and forward head  FUNCTIONAL OUTCOME MEASURES: Upper Extremity Functional Scale (UEFS): 33/80 =  41.25% function  UPPER EXTREMITY ROM:    Active ROM Right eval Left eval Left 01/16/23  Shoulder flexion  88 (requiring abduction to achieve) 110  Shoulder abduction  85 90  Shoulder adduction     Shoulder extension     Shoulder internal rotation  90% WNL  Shoulder external rotation  30% 30%  Elbow flexion  100 WNL  Elbow extension  WNL WNL  Wrist flexion  WNL WNL  Wrist extension  To neutral 10*  Wrist ulnar deviation  trace 20*  Wrist radial  deviation  trace 18*  Wrist pronation     Wrist supination  To neutral To neutral (without bracing, does have full range when bracing at elbow)  (Blank rows = not tested)  UPPER EXTREMITY MMT:     MMT Right eval Left eval Left 01/16/23  Shoulder flexion  2-/5 3-/5  Shoulder abduction     Shoulder adduction     Shoulder extension     Shoulder internal rotation     Shoulder external rotation     Middle trapezius     Lower trapezius     Elbow flexion  4-/5   Elbow extension  4-/5   Wrist flexion  3-/5 3/5  Wrist extension  3-/5 3/5  Wrist ulnar deviation     Wrist radial deviation     Wrist pronation     Wrist supination     (Blank rows = not tested)  HAND FUNCTION: Loose gross grasp, unable to assess strength on dynamometer  COORDINATION: Finger Nose Finger test: able to reach chin to finger with LUE, decreased shoulder ROM limiting movement 9 Hole Peg test: Right: 23.03 sec; Left: able to place 5 pegs  in 2 min time limit (pt needing to use side of container/multiple pegs to leverage to pick up one peg, also with difficulty with manipulation/translation in finger tips to orient to place in peg hole) Box and Blocks:  Right 44 blocks, Left 32 blocks  SENSATION: Reports duller sensation on L compared to R  COGNITION: Overall cognitive status: Within functional limits for tasks assessed  VISION: Subjective report: no changes Baseline vision: Wears glasses for reading only   TODAY'S TREATMENT:                                                                                       01/16/23 Measurements taken, see above. UE ROM: engaged in L shoulder exercises in supine to allow for gravity eliminated position.  Pt demonstrating inability to complete internal/external rotation in neutral, therefore transitioned to completing in abduction.  Pt able to achieve within 20% of full ROM with external rotation.  Incorporated use of dowel for further external rotation in abduction.  Pt  tolerating increased shoulder flexion with use of BUE with dowel, reports ability to complete increased tasks in gym as long as able to use BUE together.  PNF pattern D1 and D2 with focus on increased quality of movement.  Pt able to complete movement, however requiring cues for movements at end ranges especially with forearm supination.     01/09/23 Dynamic reaching: engaged in picking up small rings with LUE and crossing midline to place over vertical dowel to challenge coordination and shoulder ROM.  Pt dropping initial ring, however with improved success with repetition. Pt demonstrating fatigue in shoulder with repetition, requiring assist from R to complete final 2 rings. Finger flexion/extension: Pt reports purchasing and hand grip strengthener, reporting too challenging.  Therefore OT educated on use of rubber band and digiflex for finger flexion and extension.  Pt attempting isolated finger flexion and extension with rubber band and whole hand/finger flexion with improved ability to complete. Theraputty: OT instructed in full hand grasp, tip to tip pinch, 3 point pinch, key pinch, pinch and pull, and finger extension with yellow theraputty.  Pt completing each with moderate effort but ability to make indentions in putty. Box and blocks: L 41 blocks, pt reports fatigue in shoulder ~45 seconds in.    01/07/23 Coordination:  animal stacking activity with focus on picking up and manipulating animal pieces to stack on top of other pieces.  Pt demonstrating good manipulation of pieces, requiring increased effort with integration of shoulder into reaching and stacking.   In-hand manipulation with initially picking up small beads, transitioning to stones as pt with inability to maintain small beads in grasp due to decreased finger flexion to maintain tightly closed fist.  Pt still with some drops with stones, however much improved ability to focus on grasp, in-hand manipulation, and translation with  larger items.  OT challenging pt to see how many he can fit in hand and then still translate one at a time to finger tips to place in cup.   Newman Pies toss: more gross motor coordination with focus on tossing ball within ipsilateral hand and from hand to hand with focus on motor control  and ROM.  Pt demonstrating decreased supination on L limiting full catch/release especially when completing with ipsilateral hand.  Pt encouraged to continue addressing supination with weights from previous session and incorporating during functional tasks.    01/02/23 Resistance Clothespins: 2, 4# with LUE for mid functional reaching and sustained pinch. Pt req'd min cues and demonstration for movement pattern to challenge alternating grips and forearm rotation.  Pt demonstrating improved success with key grip > 3 point pinch.  Pt able to achieve shoulder flexion to ~90* during reaching with increased challenge to place on vertical dowel. Increase focus placed on 3 point pinch with repetition to challenge pinch and small functional reach. Dynamic reaching: engaged in reaching with LUE into cabinet at mod height with focus on increased shoulder flexion.  Pt able to reach ~100* to place and remove cups with LUE.  Pt demonstrating increased effort with increased ROM, but able to complete without assist from other UE. Coordination: engaged in picking up small sticks with focus on [facility] and pinch to pick up with focus on precision and motor control.  Then placing stick through slots with focus on [facility] with pinch and wrist movements to challenge motor control with incorporation of shoulder ROM.     PATIENT EDUCATION: Education details: ongoing condition specific education and UE ROM and strengthening exercises Person educated: Patient Education method: Explanation, Demonstration, Verbal cues, and Handouts Education comprehension: verbalized understanding and needs further education  HOME EXERCISE PROGRAM: Access Code:  [email or web address] URL: [email or web address] Date: 01/16/2023 Prepared by: [facility] - Outpatient  Rehab - [facility]  Exercises - [facility]  - 1-2 x daily - 2 sets - 10 reps - Open Book Chest Rotation Stretch on Foam 1/2 Roll  - 1-2 x daily - 2 sets - 10 reps - Shoulder PNF D2 Flexion  - 1-2 x daily - 2 sets - 10 reps - Shoulder Flexion Wall Slide with Towel  - 1-2 x daily - 2 sets - 10 reps - Forearm Supination with Dumbbell  - 1 x daily - 2 sets - 10 reps - Seated Wrist Flexion with Dumbbell  - 1 x daily - 2 sets - 1 reps - Seated Single Arm Bicep Curls Supinated with Dumbbell  - 1 x daily - 2 sets - 10 reps - Supine Shoulder External and Internal Rotation in Abduction  - 1 x daily - 2 sets - 10 reps - Supine Shoulder External Rotation in 45 Degrees Abduction AAROM with Dowel  - 1 x daily - 2 sets - 10 reps - Supine Shoulder Flexion Extension AAROM with Dowel  - 1 x daily - 2 sets - 10 reps - Supine Single Arm Shoulder PNF D1 Flexion  - 1 x daily - 1-2 sets - 10 reps - Supine PNF D2  - 1 x daily - 1-2 sets - 10 reps  Access Code: [email or web address] URL: [email or web address] Date: 01/09/2023 Prepared by: [facility] - Outpatient  Rehab - [facility]  Exercises - Putty Squeezes  - 1 x daily - 1 sets - 10 reps - Rolling Putty on Table  - 1 x daily - 1 sets - 10 reps - Tip Pinch with Putty  - 1 x daily - 1 sets - 10 reps - 3-Point Pinch with Putty  - 1 x daily - 1 sets - 10 reps - Key Pinch with Putty  - 1 x daily - 1 sets - 10 reps - Finger Pinch and Pull with Putty  - 1 x  daily - 1 sets - 10 reps - Finger Exension with Putty  - 1 x daily - 1 sets - 10 reps - Quick Finger Spreading with Rubber Band  - 1 x daily - 1 sets - 10 reps - Seated Finger Flexion with Resistance  - 1 x daily - 1 sets - 10 reps   GOALS: Goals reviewed with patient? Yes  SHORT TERM GOALS: Target date: 01/04/23  Pt will be independent with coordination and ROM HEP. Baseline: Goal  status: MET - 12/31/22  2.  Pt will demonstrate increased coordination to be able to place all 9 pegs into peg board during 9 hole peg test in 2 min time limit. Baseline: able to place 5 in 2 min time limit Goal status: MET - 1:06.84 on 12/31/22  3.  Pt will demonstrate increased L shoulder ROM to allow for ability to retreive light weight object from shoulder height cabinet. Baseline: 88* shoulder flexion with abduction Goal status: IN PROGRESS  4.  Pt will demonstrate improved pinch and coordination as needed to complete clothing fasteners (buttons, tying shoes, etc). Baseline:  Goal status: MET - 01/02/23   LONG TERM GOALS: Target date: 02/01/23  Pt will be independent with advanced HEP. Baseline:  Goal status: IN PROGRESS  2.  Pt will demonstrate improved UE functional use for ADLs as evidenced by increasing box/ blocks score by 6 blocks with LUE Baseline: Right 44 blocks, Left 32 blocks Goal status: MET - 41 blocks on 01/09/23  3.  Pt will demonstrate improved fine motor coordination for ADLs as evidenced by being able to complete 9 hole peg test (place and remove) in 2 min time limit Baseline: able to place 5 in 2 min time limit Goal status: MET - 1:06.84 on 12/31/22  4.  Pt will demonstrate increased L shoulder ROM and strength to tolerate holding phone to ear for 5 mins phone conversation. Baseline:  Goal status: IN PROGRESS  5.  Pt will report improved LUE functional use as evidenced by increased score on UEFS to > 60%. Baseline: 41.25% Goal status: IN PROGRESS    ASSESSMENT:  CLINICAL IMPRESSION: Pt continues to demonstrate and report decreased endurance with increased reaching and tasks incorporating increased shoulder flexion and particularly internal/external rotation.  Pt demonstrating improved tolerance to increased ROM when in supine, reviewing impact of gravity minimized position to allow for increased ROM without impact of gravity. Pt demonstrating good tolerance  to increased ROM with use of BUE together.  PERFORMANCE DEFICITS: in functional skills including ADLs, IADLs, coordination, dexterity, sensation, ROM, strength, pain, Fine motor control, Gross motor control, balance, body mechanics, endurance, decreased knowledge of precautions, decreased knowledge of use of DME, and UE functional use and psychosocial skills including coping strategies and environmental adaptation.   IMPAIRMENTS: are limiting patient from ADLs, IADLs, rest and sleep, and work.   CO-MORBIDITIES: may have co-morbidities  that affects occupational performance. Patient will benefit from skilled OT to address above impairments and improve overall function.  MODIFICATION OR ASSISTANCE TO COMPLETE EVALUATION: Min-Moderate modification of tasks or assist with assess necessary to complete an evaluation.  OT OCCUPATIONAL PROFILE AND HISTORY: Detailed assessment: Review of records and additional review of physical, cognitive, psychosocial history related to current functional performance.  CLINICAL DECISION MAKING: LOW - limited treatment options, no task modification necessary  REHAB POTENTIAL: Good  EVALUATION COMPLEXITY: Moderate    PLAN:  OT FREQUENCY: 2x/week  OT DURATION: 8 weeks  PLANNED INTERVENTIONS: self care/ADL training, therapeutic exercise,  therapeutic activity, neuromuscular re-education, manual therapy, passive range of motion, balance training, functional mobility training, electrical stimulation, ultrasound, moist heat, cryotherapy, patient/family education, psychosocial skills training, energy conservation, coping strategies training, and DME and/or AE instructions  RECOMMENDED OTHER SERVICES: NA  CONSULTED AND AGREED WITH PLAN OF CARE: Patient  PLAN FOR NEXT SESSION: Review and add to shoulder ROM as appropriate, may be able to engage in resistance bands and/or other strengthening for shoulder - as pt wanting to return to the gym, engage in reaching behind  head to simulate holding phone to ear, continue to address coordination with shoulder ROM.   Rosalio Loud, OTR/L 01/16/2023, 12:59 PM

## 2023-01-17 ENCOUNTER — Ambulatory Visit: Payer: Commercial Managed Care - PPO | Admitting: Family Medicine

## 2023-01-17 ENCOUNTER — Other Ambulatory Visit: Payer: Self-pay

## 2023-01-17 VITALS — BP 104/70 | HR 75 | Ht 73.0 in | Wt 183.0 lb

## 2023-01-17 DIAGNOSIS — G8929 Other chronic pain: Secondary | ICD-10-CM

## 2023-01-17 DIAGNOSIS — M5000 Cervical disc disorder with myelopathy, unspecified cervical region: Secondary | ICD-10-CM

## 2023-01-17 DIAGNOSIS — M25512 Pain in left shoulder: Secondary | ICD-10-CM | POA: Diagnosis not present

## 2023-01-17 NOTE — Assessment & Plan Note (Signed)
Patient had the weakness previously but seems to be doing significantly better at the moment.  Still think that this is more neurologic in nature and do feel that the atrophy should improve though.  Increase activity slowly.  Discussed posture and ergonomic exercises.  Patient did have side effects to the Cymbalta so we will discontinue and start gabapentin.  If needed we can increase to 3 times a day.  Follow-up again in 6 to 8 weeks otherwise.

## 2023-01-17 NOTE — Patient Instructions (Signed)
Stop Cymbalta for 5 days and see how you do If pain gets worse we can add mid day gabapentin Keep hands in peripheral vision Good to go to gym See you again in 3 months

## 2023-01-17 NOTE — Assessment & Plan Note (Signed)
Still believe that this is secondary to the injury of the musculature in the area nerve impingement.  Discussed with patient that I do feel that he is making some progress.  Discontinued the Cymbalta secondary to side effects and continue just the gabapentin 300 mg twice a day.  May be able to increase to 300 mg 3 times a day if needed.  Continue to increase activity slowly otherwise.  Follow-up with me again in 3 months otherwise.  Total time with patient discussing different treatment options as well as the prognosis 31 minutes

## 2023-01-18 ENCOUNTER — Other Ambulatory Visit (HOSPITAL_COMMUNITY): Payer: Self-pay

## 2023-01-18 MED ORDER — OMEPRAZOLE 40 MG PO CPDR
DELAYED_RELEASE_CAPSULE | ORAL | 3 refills | Status: DC
  Filled 2023-01-18: qty 90, 90d supply, fill #0
  Filled 2023-04-15: qty 30, 30d supply, fill #1
  Filled 2023-05-18: qty 30, 30d supply, fill #2
  Filled 2023-06-15: qty 30, 30d supply, fill #3
  Filled 2023-07-13: qty 30, 30d supply, fill #4
  Filled 2023-08-23: qty 30, 30d supply, fill #5
  Filled 2023-09-30: qty 30, 30d supply, fill #6
  Filled 2023-10-27: qty 30, 30d supply, fill #7
  Filled 2023-11-24: qty 30, 30d supply, fill #8
  Filled 2023-12-27 – 2023-12-28 (×2): qty 30, 30d supply, fill #9

## 2023-01-21 ENCOUNTER — Ambulatory Visit: Payer: Commercial Managed Care - PPO | Admitting: Occupational Therapy

## 2023-01-21 DIAGNOSIS — M79642 Pain in left hand: Secondary | ICD-10-CM | POA: Diagnosis not present

## 2023-01-21 DIAGNOSIS — M79602 Pain in left arm: Secondary | ICD-10-CM

## 2023-01-21 DIAGNOSIS — R208 Other disturbances of skin sensation: Secondary | ICD-10-CM | POA: Diagnosis not present

## 2023-01-21 DIAGNOSIS — R2681 Unsteadiness on feet: Secondary | ICD-10-CM

## 2023-01-21 DIAGNOSIS — R2689 Other abnormalities of gait and mobility: Secondary | ICD-10-CM | POA: Diagnosis not present

## 2023-01-21 DIAGNOSIS — R278 Other lack of coordination: Secondary | ICD-10-CM | POA: Diagnosis not present

## 2023-01-21 DIAGNOSIS — M6281 Muscle weakness (generalized): Secondary | ICD-10-CM

## 2023-01-21 NOTE — Therapy (Signed)
OUTPATIENT OCCUPATIONAL THERAPY NEURO Treatment Session  Patient Name: Johnny Robinson MRN: [id number] DOB:June 16, 1966, 57 y.o., male Today's Date: 01/23/2023  PCP: Marguarite Arbour, MD REFERRING PROVIDER: Jacquelynn Cree, PA-C  END OF SESSION:  OT End of Session - 01/23/23 1018     Visit Number 12    Number of Visits 17    Date for OT Re-Evaluation 02/01/23    Authorization Type [employer] employee - [employer] PPO    OT Start Time 1018    OT Stop Time 1102    OT Time Calculation (min) 44 min    Activity Tolerance Patient tolerated treatment well;No increased pain;Patient limited by fatigue    Behavior During Therapy [facility] for tasks assessed/performed             Past Medical History:  Diagnosis Date   Arthritis    Chronic kidney disease 2016   RENAL INFARCT   GERD (gastroesophageal reflux disease)    Hypertension    Reflux    Subdural hematoma (HCC) 1991   Past Surgical History:  Procedure Laterality Date   BACK SURGERY  11/2017   burr hole  1992   to evacuate SDH   COLONOSCOPY WITH PROPOFOL N/A 09/02/2018   Procedure: COLONOSCOPY WITH PROPOFOL;  Surgeon: Midge Minium, MD;  Location: [facility] ENDOSCOPY;  Service: Endoscopy;  Laterality: N/A;   EXCISION MASS ABDOMINAL Left 05/28/2018   Procedure: EXCISIONAL BIOPSY MASS ABDOMINAL WALL;  Surgeon: Carolan Shiver, MD;  Location: [facility] ORS;  Service: General;  Laterality: Left;   PERIPHERAL VASCULAR CATHETERIZATION Right 05/18/2015   Procedure: Renal Angiography;  Surgeon: Annice Needy, MD;  Location: [facility] INVASIVE CV LAB;  Service: Cardiovascular;  Laterality: Right;   PERIPHERAL VASCULAR CATHETERIZATION Bilateral 05/18/2015   Procedure: Renal Intervention;  Surgeon: Annice Needy, MD;  Location: [facility] INVASIVE CV LAB;  Service: Cardiovascular;  Laterality: Bilateral;   TOTAL HIP ARTHROPLASTY Left 03/07/2022   Procedure: TOTAL HIP ARTHROPLASTY ANTERIOR APPROACH;  Surgeon: Ollen Gross, MD;  Location: [facility] ORS;  Service: Orthopedics;   Laterality: Left;   Patient Active Problem List   Diagnosis Date Noted   Nerve pain 12/31/2022   Left shoulder pain 11/30/2022   Urinary hesitancy 11/30/2022   Constipation 11/30/2022   Biceps tendinitis of left shoulder 11/30/2022   Multifocal pneumonia 11/30/2022   Chronic pain syndrome 11/21/2022   HNP (herniated nucleus pulposus) with myelopathy, cervical 11/15/2022   Cervical myelopathy (HCC) 11/13/2022   OA (osteoarthritis) of hip 03/07/2022   Primary osteoarthritis of left hip 03/07/2022   GERD (gastroesophageal reflux disease) 11/27/2021   Encounter for screening colonoscopy    Benign neoplasm of rectosigmoid junction    Hyperlipidemia 12/18/2017   Essential hypertension 12/17/2016   Chest pain 05/30/2015   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    ONSET DATE: 11/13/22  REFERRING DIAG: G95.9 (ICD-10-CM) - Cervical myelopathy  THERAPY DIAG:  Muscle weakness (generalized)  Other lack of coordination  Pain in left arm  Unsteadiness on feet  Pain in left hand  Other disturbances of skin sensation  Rationale for Evaluation and Treatment: Rehabilitation  PERTINENT HISTORY: 57 year old R handed male with 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 Dr.  Pool expressed concerns of cord contusion due to worsening of myelopathy and MRI spine done which showed post op changes form ACDF C3-C6 and patchy signal abnormality involving the central and left cord at C5/6 concerning for contusion/injury.  PRECAUTIONS: Cervical and Other: lifting precautions not > 5#  WEIGHT BEARING RESTRICTIONS: No  SUBJECTIVE:   SUBJECTIVE STATEMENT: Pt states  that he works out at a gym but is not really sure if he is doing the best or correct activities.  He states not doing much at home in terms of a home exercise program.  Pt accompanied by: self and significant other   PAIN:  Are you having pain?  Not at rest today  FALLS: Has patient fallen in last 6 months? No  LIVING ENVIRONMENT: Lives with: lives with their spouse and 52 yo Lives in: House/apartment Stairs: Yes: Internal: full flight steps; on right going up and External: 4 steps; none Has following equipment at home: Quad cane small base, Walker - 2 wheeled, shower chair, bed side commode, and hand held shower head  PLOF: Independent, Independent with basic ADLs, and Vocation/Vocational requirements: practices law  PATIENT GOALS: pt would like to get arm progressing like leg    OBJECTIVE: (All objective assessments below are from initial evaluation on: 12/06/22 unless otherwise specified.)   HAND DOMINANCE: Right  ADLs: Overall ADLs: requires increased time and thought to complete Transfers/ambulation related to ADLs: Mod I with SBQC Eating: requires assistance to cut food Grooming: Mod I UB Dressing: requiring assistance with buttons LB Dressing: requiring assistance with socks/tying shoes Toileting: Mod I Bathing: Mod I Tub Shower transfers: Mod I, has built in shower seat Equipment: Walk in shower  IADLs: Light housekeeping: has attempted vacuuming without issue Meal Prep: has attempted cooking, spouse or children assisting with retrieving items if >5# Medication management: spouse is filling pillbox and is reminding him to take  MOBILITY STATUS: Needs Assist: Mod I - Supervision with SBQC  POSTURE COMMENTS:  rounded shoulders and forward head  FUNCTIONAL OUTCOME MEASURES: Upper Extremity Functional Scale (UEFS): 33/80 =  41.25% function  UPPER EXTREMITY ROM:    Active ROM Left eval Left 01/16/23  Shoulder flexion 88 (requiring abduction to achieve) 110   Shoulder abduction 85 90  Shoulder adduction    Shoulder extension    Shoulder internal rotation 90% WNL  Shoulder external rotation 30% 30%  Elbow flexion 100 WNL  Elbow extension WNL WNL  Wrist flexion WNL WNL  Wrist extension To neutral 10*  Wrist ulnar deviation trace 20*  Wrist radial deviation trace 18*  Wrist pronation    Wrist supination To neutral To neutral (without bracing, does have full range when bracing at elbow)  (Blank rows = not tested)  UPPER EXTREMITY MMT:     MMT Left eval Left 01/16/23  Shoulder flexion 2-/5 3-/5  Shoulder abduction    Shoulder adduction    Shoulder extension    Shoulder internal rotation    Shoulder external rotation    Middle trapezius    Lower trapezius    Elbow flexion 4-/5   Elbow extension 4-/5   Wrist flexion 3-/5 3/5  Wrist extension 3-/5 3/5  Wrist ulnar deviation    Wrist radial deviation    Wrist pronation    Wrist supination    (Blank rows = not tested)  HAND FUNCTION: Loose gross grasp, unable to assess strength on dynamometer  COORDINATION: Finger Nose Finger test: able to reach chin to finger with LUE, decreased shoulder  ROM limiting movement 9 Hole Peg test: Right: 23.03 sec; Left: able to place 5 pegs in 2 min time limit (pt needing to use side of container/multiple pegs to leverage to pick up one peg, also with difficulty with manipulation/translation in finger tips to orient to place in peg hole) Box and Blocks:  Right 44 blocks, Left 32 blocks  SENSATION: Reports duller sensation on L compared to R  COGNITION: Overall cognitive status: Within functional limits for tasks assessed  VISION: Subjective report: no changes Baseline vision: Wears glasses for reading only   TODAY'S TREATMENT:                                                                                       01/23/23: He has signs of significant ulnar nerve impingement or problems in the left arm.  OT educates on the importance of decreasing  entrapment through stretches and nerve gliding.  Also being consistent with fine motor skills coordination activities to make improvements was also emphasized on.  Because he has significant wasting of his scapular muscles he also lacks proximal stability including almost all external rotation (infraspinatus is very atrophied).  Today he was given a large and comprehensive home exercise program to try to work on nerve gliding, stretching out the tight internal rotators and shoulder, strengthening his entire left arm from his shoulder and scapula down to his hand.  We were only able to do the portions listed below today and very quickly, so they will need reviewed in upcoming sessions.  Additionally, we only had time to discuss strengthening in 2 planes of motion at the shoulder but we will need to add strengthening for biceps triceps forearm wrist and hand (ulnar nerve muscles mainly) as well.  He states understanding education given today and will do his best to work on it until seen again.  Exercises - Ulnar Nerve Flossing  - 3-4 x daily - 5-10 reps - Seated Scapular Retraction  - 3-4 x daily - 10-15 reps - 5 sec hold - Seated Shoulder Flexion Towel Slide at Table Top  - 4 x daily - 3-5 reps - 15 hold - Seated Shoulder Abduction Towel Slide at Table Top  - 3-4 x daily - 3-5 reps - 15 hold - Seated Shoulder External Rotation PROM on Table  - 3-4 x daily - 3-5 reps - 15 sec hold - Sleeper Stretch  - 3-4 x daily - 3-5 reps - 15 hold - Standing neck/upper traps stretch  - 4-6 x daily - 3-5 reps - 15 sec hold - Wrist Prayer Stretch  - 4 x daily - 3-5 reps - 15 sec hold - Seated Wrist Flexion Stretch  - 3-4 x daily - 3 reps - 10 second  hold - PUSH KNUCKLES DOWN  - 4 x daily - 3-5 reps - 15 seconds hold - HOOK Stretch  - 4 x daily - 3-5 reps - 15-20 sec hold - BACK KNUCKLE STRETCHES   - 4 x daily - 3-5 reps - 15 sec hold - Seated Finger Composite Flexion Stretch  - 4 x daily - 3-5 reps - 15 hold - Thumb  stretch  -  4 x daily - 3-5 reps - 15-20 sec hold - Standing Shoulder Row with Anchored Resistance  - 2-4 x daily - 1-2 sets - 10-15 reps - Sidelying Shoulder External Rotation with Dumbbell  - 3-4 x daily - 3-4 x weekly - 1-2 sets - 10-15 reps  In-Hand Manipulation Skills Rotation:  Hold pen, try to "twirl" like a baton, keeping flat with surface of table. Try going BOTH directions 10x  Flip:  Hold pen in writing position, flip in an arch to "erase" position, then back to "write" position. Do not lift hand off table.  10x  Translation:  Open hand palm up,  put an object in your palm and then use your fingers and thumb to move it to the tips of your fingers, pinched against your thumb.  10x (bigger is easier (fat marker), smaller is harder (penny)) Shift:  Hold pen like a dart, start "shifting" it forward until you are holding it at the base, then shift it backwards until you are holding it at the tip again. 10x (like putting a key in a key hole)    PATIENT EDUCATION: Educion details: ongoing condition specific education and UE ROM and strengatthening exercises Person educated: Patient Education method: Explanation, Demonstration, Verbal cues, and Handouts Education comprehension: verbalized understanding and needs further education  HOME EXERCISE PROGRAM: 01/23/23: Access Code: [email or web address] URL: [email or web address] Date: 01/23/2023 Prepared by: Fannie Knee  01/16/23: Access Code: [email or web address] URL: [email or web address] Date: 01/16/2023 Prepared by: [facility] - Outpatient  Rehab - [facility]  01/09/23: Access Code: [email or web address] URL: [email or web address] Date: 01/09/2023 Prepared by: [facility] - Outpatient  Rehab - [facility]   GOALS: Goals reviewed with patient? Yes  SHORT TERM GOALS: Target date: 01/04/23  Pt will be independent with coordination and ROM HEP. Baseline: Goal status: MET - 12/31/22  2.  Pt will demonstrate  increased coordination to be able to place all 9 pegs into peg board during 9 hole peg test in 2 min time limit. Baseline: able to place 5 in 2 min time limit Goal status: MET - 1:06.84 on 12/31/22  3.  Pt will demonstrate increased L shoulder ROM to allow for ability to retreive light weight object from shoulder height cabinet. Baseline: 88* shoulder flexion with abduction Goal status: IN PROGRESS  4.  Pt will demonstrate improved pinch and coordination as needed to complete clothing fasteners (buttons, tying shoes, etc). Baseline:  Goal status: MET - 01/02/23   LONG TERM GOALS: Target date: 02/01/23  Pt will be independent with advanced HEP. Baseline:  Goal status: IN PROGRESS  2.  Pt will demonstrate improved UE functional use for ADLs as evidenced by increasing box/ blocks score by 6 blocks with LUE Baseline: Right 44 blocks, Left 32 blocks Goal status: MET - 41 blocks on 01/09/23  3.  Pt will demonstrate improved fine motor coordination for ADLs as evidenced by being able to complete 9 hole peg test (place and remove) in 2 min time limit Baseline: able to place 5 in 2 min time limit Goal status: MET - 1:06.84 on 12/31/22  4.  Pt will demonstrate increased L shoulder ROM and strength to tolerate holding phone to ear for 5 mins phone conversation. Baseline:  Goal status: IN PROGRESS  5.  Pt will report improved LUE functional use as evidenced by increased score on UEFS to > 60%. Baseline: 41.25% Goal status: IN PROGRESS    ASSESSMENT:  CLINICAL IMPRESSION: 01/23/23: OT took new approach today to managing stiffness and  weakness from the shoulder down to the hand, focusing more on ulnar nerve entrapment.  He states understanding but we still need to do some review of these things and add a few strengthening exercises to help him with his "gym routine" that he has largely been making up on his own.   PLAN:  OT FREQUENCY: 2x/week  OT DURATION: 8 weeks  PLANNED INTERVENTIONS:  self care/ADL training, therapeutic exercise, therapeutic activity, neuromuscular re-education, manual therapy, passive range of motion, balance training, functional mobility training, electrical stimulation, ultrasound, moist heat, cryotherapy, patient/family education, psychosocial skills training, energy conservation, coping strategies training, and DME and/or AE instructions  RECOMMENDED OTHER SERVICES: NA  CONSULTED AND AGREED WITH PLAN OF CARE: Patient  PLAN FOR NEXT SESSION:  Keep emphasizing stretching into internal and external rotation of the shoulder as well as strengthening the external rotators (teres minor and infraspinatus groups).  Review fine motor skills as needed keep on ulnar nerve gliding.  Needs a progress visit to determine goal status next session  Fannie Knee, OTR/L 01/23/2023, 1:46 PM

## 2023-01-21 NOTE — Therapy (Signed)
OUTPATIENT OCCUPATIONAL THERAPY NEURO Treatment Session  Patient Name: Johnny Robinson MRN: [id number] DOB:1966/01/07, 57 y.o., male Today's Date: 01/21/2023  PCP: Marguarite Arbour, MD REFERRING PROVIDER: Jacquelynn Cree, PA-C  END OF SESSION:  OT End of Session - 01/21/23 0934     Visit Number 11    Number of Visits 17    Date for OT Re-Evaluation 02/01/23    Authorization Type [employer] employee - [employer]    OT Start Time [phone]    OT Stop Time 1015    OT Time Calculation (min) 43 min                      Past Medical History:  Diagnosis Date   Arthritis    Chronic kidney disease 2016   RENAL INFARCT   GERD (gastroesophageal reflux disease)    Hypertension    Reflux    Subdural hematoma (HCC) 1991   Past Surgical History:  Procedure Laterality Date   BACK SURGERY  11/2017   burr hole  1992   to evacuate SDH   COLONOSCOPY WITH PROPOFOL N/A 09/02/2018   Procedure: COLONOSCOPY WITH PROPOFOL;  Surgeon: Midge Minium, MD;  Location: [facility] ENDOSCOPY;  Service: Endoscopy;  Laterality: N/A;   EXCISION MASS ABDOMINAL Left 05/28/2018   Procedure: EXCISIONAL BIOPSY MASS ABDOMINAL WALL;  Surgeon: Carolan Shiver, MD;  Location: [facility] ORS;  Service: General;  Laterality: Left;   PERIPHERAL VASCULAR CATHETERIZATION Right 05/18/2015   Procedure: Renal Angiography;  Surgeon: Annice Needy, MD;  Location: [facility] INVASIVE CV LAB;  Service: Cardiovascular;  Laterality: Right;   PERIPHERAL VASCULAR CATHETERIZATION Bilateral 05/18/2015   Procedure: Renal Intervention;  Surgeon: Annice Needy, MD;  Location: [facility] INVASIVE CV LAB;  Service: Cardiovascular;  Laterality: Bilateral;   TOTAL HIP ARTHROPLASTY Left 03/07/2022   Procedure: TOTAL HIP ARTHROPLASTY ANTERIOR APPROACH;  Surgeon: Ollen Gross, MD;  Location: [facility] ORS;  Service: Orthopedics;  Laterality: Left;   Patient Active Problem List   Diagnosis Date Noted   Nerve pain 12/31/2022   Left shoulder pain 11/30/2022   Urinary  hesitancy 11/30/2022   Constipation 11/30/2022   Biceps tendinitis of left shoulder 11/30/2022   Multifocal pneumonia 11/30/2022   Chronic pain syndrome 11/21/2022   HNP (herniated nucleus pulposus) with myelopathy, cervical 11/15/2022   Cervical myelopathy (HCC) 11/13/2022   OA (osteoarthritis) of hip 03/07/2022   Primary osteoarthritis of left hip 03/07/2022   GERD (gastroesophageal reflux disease) 11/27/2021   Encounter for screening colonoscopy    Benign neoplasm of rectosigmoid junction    Hyperlipidemia 12/18/2017   Essential hypertension 12/17/2016   Chest pain 05/30/2015   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 Dr. Jordan Likes expressed concerns of  cord contusion due to worsening of myelopathy and MRI spine done which showed post op changes form ACDF C3-C6 and patchy signal abnormality involving the central and left cord at C5/6 concerning for contusion/injury.  PRECAUTIONS: Cervical and Other: lifting precautions not > 5#  WEIGHT BEARING RESTRICTIONS: No  PAIN:  Are you having pain? No  FALLS: Has patient fallen in last 6 months? No  LIVING ENVIRONMENT: Lives  with: lives with their spouse and 32 yo Lives in: House/apartment Stairs: Yes: Internal: full flight steps; on right going up and External: 4 steps; none Has following equipment at home: Quad cane small base, Walker - 2 wheeled, shower chair, bed side commode, and hand held shower head  PLOF: Independent, Independent with basic ADLs, and Vocation/Vocational requirements: practices law  PATIENT GOALS: pt would like to get arm progressing like leg   OBJECTIVE:   HAND DOMINANCE: Right  ADLs: Overall ADLs: requires increased time and thought to complete Transfers/ambulation related to ADLs: Mod I with SBQC Eating: requires assistance to cut food Grooming: Mod I UB Dressing: requiring assistance with buttons LB Dressing: requiring assistance with socks/tying shoes Toileting: Mod I Bathing: Mod I Tub Shower transfers: Mod I, has built in shower seat Equipment: Walk in shower  IADLs: Light housekeeping: has attempted vacuuming without issue Meal Prep: has attempted cooking, spouse or children assisting with retrieving items if >5# Medication management: spouse is filling pillbox and is reminding him to take  MOBILITY STATUS: Needs Assist: Mod I - Supervision with SBQC  POSTURE COMMENTS:  rounded shoulders and forward head  FUNCTIONAL OUTCOME MEASURES: Upper Extremity Functional Scale (UEFS): 33/80 =  41.25% function 01/21/23: 46/80 = 57.5% function  UPPER EXTREMITY ROM:    Active ROM Right eval Left eval Left 01/16/23  Shoulder flexion  88 (requiring abduction to achieve) 110  Shoulder abduction  85 90  Shoulder adduction     Shoulder extension     Shoulder internal rotation  90% WNL  Shoulder external rotation  30% 30%  Elbow flexion  100 WNL  Elbow extension  WNL WNL  Wrist flexion  WNL WNL  Wrist extension  To neutral 10*  Wrist ulnar deviation  trace 20*  Wrist radial deviation  trace 18*  Wrist pronation     Wrist supination  To neutral To neutral (without bracing,  does have full range when bracing at elbow)  (Blank rows = not tested)  UPPER EXTREMITY MMT:     MMT Right eval Left eval Left 01/16/23  Shoulder flexion  2-/5 3-/5  Shoulder abduction     Shoulder adduction     Shoulder extension     Shoulder internal rotation     Shoulder external rotation     Middle trapezius     Lower trapezius     Elbow flexion  4-/5   Elbow extension  4-/5   Wrist flexion  3-/5 3/5  Wrist extension  3-/5 3/5  Wrist ulnar deviation     Wrist radial deviation     Wrist pronation     Wrist supination     (Blank rows = not tested)  HAND FUNCTION: Loose gross grasp, unable to assess strength on dynamometer 01/21/23: R: 74#, L: 10#  COORDINATION: Finger Nose Finger test: able to reach chin to finger with LUE, decreased shoulder ROM limiting movement 9 Hole Peg test: Right: 23.03 sec; Left: able to place 5 pegs in 2 min time limit (pt needing to use side of container/multiple pegs to leverage  to pick up one peg, also with difficulty with manipulation/translation in finger tips to orient to place in peg hole) Box and Blocks:  Right 44 blocks, Left 32 blocks  SENSATION: Reports duller sensation on L compared to R  COGNITION: Overall cognitive status: Within functional limits for tasks assessed  VISION: Subjective report: no changes Baseline vision: Wears glasses for reading only   TODAY'S TREATMENT:                                                                                       01/21/23 UEFS: 46/80 = 57.5% Coordination: engaged in picking up variety of sized pegs from table top and out to side to facilitate increased functional reach/ROM of shoulder.  Pt initially placing pegs with use of pronation, therapist providing demonstration and encouragement to attempt to incorporate supination when placing pegs.  Pt reporting increased fatigue with repetition and attempts at supination.  OT placing bowl to Left of body to facilitate increased shoulder  abduction and supination.  Pt with difficulty supinating past neutral.  Attempted to facilitate increased hand grasp with pt removing multiple small pegs and place in palm of hand.  Pt with decreased ability to close hand, especially ulnarly therefore positioning together in grip instead of palm to compensate.   Hand grip: Engaged in grip strengthening against 10# resistance ball with focus on full grasp to facilitate hand closure to allow for engagement in translation tasks.  Reiterated grasp exercises with theraputty and discussed functional grasp activities at home.     01/16/23 Measurements taken, see above. UE ROM: engaged in L shoulder exercises in supine to allow for gravity eliminated position.  Pt demonstrating inability to complete internal/external rotation in neutral, therefore transitioned to completing in abduction.  Pt able to achieve within 20% of full ROM with external rotation.  Incorporated use of dowel for further external rotation in abduction.  Pt tolerating increased shoulder flexion with use of BUE with dowel, reports ability to complete increased tasks in gym as long as able to use BUE together.  PNF pattern D1 and D2 with focus on increased quality of movement.  Pt able to complete movement, however requiring cues for movements at end ranges especially with forearm supination.     01/09/23 Dynamic reaching: engaged in picking up small rings with LUE and crossing midline to place over vertical dowel to challenge coordination and shoulder ROM.  Pt dropping initial ring, however with improved success with repetition. Pt demonstrating fatigue in shoulder with repetition, requiring assist from R to complete final 2 rings. Finger flexion/extension: Pt reports purchasing and hand grip strengthener, reporting too challenging.  Therefore OT educated on use of rubber band and digiflex for finger flexion and extension.  Pt attempting isolated finger flexion and extension with rubber band  and whole hand/finger flexion with improved ability to complete. Theraputty: OT instructed in full hand grasp, tip to tip pinch, 3 point pinch, key pinch, pinch and pull, and finger extension with yellow theraputty.  Pt completing each with moderate effort but ability to make indentions in putty. Box and blocks: L 41 blocks, pt reports fatigue in shoulder ~45 seconds in.  PATIENT EDUCATION: Education details: ongoing condition specific education and UE ROM and strengthening exercises Person educated: Patient Education method: Explanation, Demonstration, Verbal cues, and Handouts Education comprehension: verbalized understanding and needs further education  HOME EXERCISE PROGRAM: Access Code: [email or web address] URL: [email or web address] Date: 01/16/2023 Prepared by: [facility] - Outpatient  Rehab - [facility]  Exercises - [facility]  - 1-2 x daily - 2 sets - 10 reps - Open Book Chest Rotation Stretch on Foam 1/2 Roll  - 1-2 x daily - 2 sets - 10 reps - Shoulder PNF D2 Flexion  - 1-2 x daily - 2 sets - 10 reps - Shoulder Flexion Wall Slide with Towel  - 1-2 x daily - 2 sets - 10 reps - Forearm Supination with Dumbbell  - 1 x daily - 2 sets - 10 reps - Seated Wrist Flexion with Dumbbell  - 1 x daily - 2 sets - 1 reps - Seated Single Arm Bicep Curls Supinated with Dumbbell  - 1 x daily - 2 sets - 10 reps - Supine Shoulder External and Internal Rotation in Abduction  - 1 x daily - 2 sets - 10 reps - Supine Shoulder External Rotation in 45 Degrees Abduction AAROM with Dowel  - 1 x daily - 2 sets - 10 reps - Supine Shoulder Flexion Extension AAROM with Dowel  - 1 x daily - 2 sets - 10 reps - Supine Single Arm Shoulder PNF D1 Flexion  - 1 x daily - 1-2 sets - 10 reps - Supine PNF D2  - 1 x daily - 1-2 sets - 10 reps  Access Code: [email or web address] URL: [email or web address] Date: 01/09/2023 Prepared by: [facility] - Outpatient  Rehab - [facility]  Exercises - Putty  Squeezes  - 1 x daily - 1 sets - 10 reps - Rolling Putty on Table  - 1 x daily - 1 sets - 10 reps - Tip Pinch with Putty  - 1 x daily - 1 sets - 10 reps - 3-Point Pinch with Putty  - 1 x daily - 1 sets - 10 reps - Key Pinch with Putty  - 1 x daily - 1 sets - 10 reps - Finger Pinch and Pull with Putty  - 1 x daily - 1 sets - 10 reps - Finger Exension with Putty  - 1 x daily - 1 sets - 10 reps - Quick Finger Spreading with Rubber Band  - 1 x daily - 1 sets - 10 reps - Seated Finger Flexion with Resistance  - 1 x daily - 1 sets - 10 reps   GOALS: Goals reviewed with patient? Yes  SHORT TERM GOALS: Target date: 01/04/23  Pt will be independent with coordination and ROM HEP. Baseline: Goal status: MET - 12/31/22  2.  Pt will demonstrate increased coordination to be able to place all 9 pegs into peg board during 9 hole peg test in 2 min time limit. Baseline: able to place 5 in 2 min time limit Goal status: MET - 1:06.84 on 12/31/22  3.  Pt will demonstrate increased L shoulder ROM to allow for ability to retreive light weight object from shoulder height cabinet. Baseline: 88* shoulder flexion with abduction Goal status: IN PROGRESS  4.  Pt will demonstrate improved pinch and coordination as needed to complete clothing fasteners (buttons, tying shoes, etc). Baseline:  Goal status: MET - 01/02/23   LONG TERM GOALS: Target date: 02/01/23  Pt will be independent with advanced HEP. Baseline:  Goal  status: IN PROGRESS  2.  Pt will demonstrate improved UE functional use for ADLs as evidenced by increasing box/ blocks score by 6 blocks with LUE Baseline: Right 44 blocks, Left 32 blocks Goal status: MET - 41 blocks on 01/09/23  3.  Pt will demonstrate improved fine motor coordination for ADLs as evidenced by being able to complete 9 hole peg test (place and remove) in 2 min time limit Baseline: able to place 5 in 2 min time limit Goal status: MET - 1:06.84 on 12/31/22  4.  Pt will  demonstrate increased L shoulder ROM and strength to tolerate holding phone to ear for 5 mins phone conversation. Baseline:  Goal status: IN PROGRESS  5.  Pt will report improved LUE functional use as evidenced by increased score on UEFS to > 60%. Baseline: 41.25% Goal status: IN PROGRESS    ASSESSMENT:  CLINICAL IMPRESSION: Pt continues to demonstrate and report decreased endurance with increased reaching and tasks incorporating increased shoulder flexion and particularly internal/external rotation.  Pt demonstrating good compensatory strategies during peg board activity, however OT attempting to facilitate increased supination and hand closure.  Pt is demonstrating increased engagement in functional tasks in clinic and at home, however reporting frequent attempts at use of LUE and then switching to RUE.  Educated on use of LUE as gross-diminished assist and utilizing RUE as "backup" when attempting to remove lightweight objects from cabinet or fridge.  PERFORMANCE DEFICITS: in functional skills including ADLs, IADLs, coordination, dexterity, sensation, ROM, strength, pain, Fine motor control, Gross motor control, balance, body mechanics, endurance, decreased knowledge of precautions, decreased knowledge of use of DME, and UE functional use and psychosocial skills including coping strategies and environmental adaptation.   IMPAIRMENTS: are limiting patient from ADLs, IADLs, rest and sleep, and work.   CO-MORBIDITIES: may have co-morbidities  that affects occupational performance. Patient will benefit from skilled OT to address above impairments and improve overall function.  MODIFICATION OR ASSISTANCE TO COMPLETE EVALUATION: Min-Moderate modification of tasks or assist with assess necessary to complete an evaluation.  OT OCCUPATIONAL PROFILE AND HISTORY: Detailed assessment: Review of records and additional review of physical, cognitive, psychosocial history related to current functional  performance.  CLINICAL DECISION MAKING: LOW - limited treatment options, no task modification necessary  REHAB POTENTIAL: Good  EVALUATION COMPLEXITY: Moderate    PLAN:  OT FREQUENCY: 2x/week  OT DURATION: 8 weeks  PLANNED INTERVENTIONS: self care/ADL training, therapeutic exercise, therapeutic activity, neuromuscular re-education, manual therapy, passive range of motion, balance training, functional mobility training, electrical stimulation, ultrasound, moist heat, cryotherapy, patient/family education, psychosocial skills training, energy conservation, coping strategies training, and DME and/or AE instructions  RECOMMENDED OTHER SERVICES: NA  CONSULTED AND AGREED WITH PLAN OF CARE: Patient  PLAN FOR NEXT SESSION: Review and add to shoulder ROM as appropriate, may be able to engage in resistance bands and/or other strengthening for shoulder - as pt wanting to return to the gym, engage in reaching behind head to simulate holding phone to ear, continue to address coordination with shoulder ROM.   Montravious Weigelt, OTR/L 01/21/2023, 10:06 AM

## 2023-01-23 ENCOUNTER — Encounter: Payer: Self-pay | Admitting: Rehabilitative and Restorative Service Providers"

## 2023-01-23 ENCOUNTER — Ambulatory Visit: Payer: Commercial Managed Care - PPO | Admitting: Rehabilitative and Restorative Service Providers"

## 2023-01-23 DIAGNOSIS — R208 Other disturbances of skin sensation: Secondary | ICD-10-CM | POA: Diagnosis not present

## 2023-01-23 DIAGNOSIS — R2681 Unsteadiness on feet: Secondary | ICD-10-CM | POA: Diagnosis not present

## 2023-01-23 DIAGNOSIS — M79602 Pain in left arm: Secondary | ICD-10-CM

## 2023-01-23 DIAGNOSIS — M6281 Muscle weakness (generalized): Secondary | ICD-10-CM

## 2023-01-23 DIAGNOSIS — R278 Other lack of coordination: Secondary | ICD-10-CM | POA: Diagnosis not present

## 2023-01-23 DIAGNOSIS — R2689 Other abnormalities of gait and mobility: Secondary | ICD-10-CM | POA: Diagnosis not present

## 2023-01-23 DIAGNOSIS — M79642 Pain in left hand: Secondary | ICD-10-CM | POA: Diagnosis not present

## 2023-01-29 ENCOUNTER — Ambulatory Visit: Payer: Commercial Managed Care - PPO | Admitting: Occupational Therapy

## 2023-01-29 DIAGNOSIS — M79642 Pain in left hand: Secondary | ICD-10-CM | POA: Diagnosis not present

## 2023-01-29 DIAGNOSIS — M79602 Pain in left arm: Secondary | ICD-10-CM

## 2023-01-29 DIAGNOSIS — R2681 Unsteadiness on feet: Secondary | ICD-10-CM | POA: Diagnosis not present

## 2023-01-29 DIAGNOSIS — M6281 Muscle weakness (generalized): Secondary | ICD-10-CM | POA: Diagnosis not present

## 2023-01-29 DIAGNOSIS — R208 Other disturbances of skin sensation: Secondary | ICD-10-CM

## 2023-01-29 DIAGNOSIS — R2689 Other abnormalities of gait and mobility: Secondary | ICD-10-CM | POA: Diagnosis not present

## 2023-01-29 DIAGNOSIS — R278 Other lack of coordination: Secondary | ICD-10-CM | POA: Diagnosis not present

## 2023-01-29 NOTE — Therapy (Signed)
OUTPATIENT OCCUPATIONAL THERAPY NEURO  Treatment Session & Progress Note  Patient Name: Johnny Robinson MRN: [id number] DOB:03/21/1966, 57 y.o., male Today's Date: 01/29/2023  PCP: Marguarite Arbour, MD REFERRING PROVIDER: Jacquelynn Cree, PA-C  Occupational Therapy Progress Note  Dates of Reporting Period: 12/06/22 to 01/29/23   END OF SESSION:  OT End of Session - 01/29/23 1154     Visit Number 13    Number of Visits 17    Date for OT Re-Evaluation 02/01/23    Authorization Type [employer] employee - [employer] P[employer]    OT Start Time 1015    OT Stop Time 1100    OT Time Calculation (min) 45 min    Activity Tolerance Patient tolerated treatment well;No increased pain;Patient limited by fatigue    Behavior During Therapy [facility] for tasks assessed/performed              Past Medical History:  Diagnosis Date   Arthritis    Chronic kidney disease 2016   RENAL INFARCT   GERD (gastroesophageal reflux disease)    Hypertension    Reflux    Subdural hematoma (HCC) 1991   Past Surgical History:  Procedure Laterality Date   BACK SURGERY  11/2017   burr hole  1992   to evacuate SDH   COLONOSCOPY WITH PROPOFOL N/A 09/02/2018   Procedure: COLONOSCOPY WITH PROPOFOL;  Surgeon: Midge Minium, MD;  Location: [facility];  Service: Endoscopy;  Laterality: N/A;   EXCISION MASS ABDOMINAL Left 05/28/2018   Procedure: EXCISIONAL BIOPSY MASS ABDOMINAL WALL;  Surgeon: Carolan Shiver, MD;  Location: [facility] OR[facility];  Service: General;  Laterality: Left;   PERIPHERAL VASCULAR CATHETERIZATION Right 05/18/2015   Procedure: Renal Angiography;  Surgeon: Annice Needy, MD;  Location: [facility];  Service: Cardiovascular;  Laterality: Right;   PERIPHERAL VASCULAR CATHETERIZATION Bilateral 05/18/2015   Procedure: Renal Intervention;  Surgeon: Annice Needy, MD;  Location: [facility] CV [facility];  Service: Cardiovascular;  Laterality: Bilateral;   TOTAL HIP ARTHROPLASTY Left 03/07/2022   Procedure:  TOTAL HIP ARTHROPLASTY ANTERIOR APPROACH;  Surgeon: Ollen Gross, MD;  Location: [facility];  Service: Orthopedics;  Laterality: Left;   Patient Active Problem List   Diagnosis Date Noted   Nerve pain 12/31/2022   Left shoulder pain 11/30/2022   Urinary hesitancy 11/30/2022   Constipation 11/30/2022   Biceps tendinitis of left shoulder 11/30/2022   Multifocal pneumonia 11/30/2022   Chronic pain syndrome 11/21/2022   HNP (herniated nucleus pulposus) with myelopathy, cervical 11/15/2022   Cervical myelopathy (HCC) 11/13/2022   OA (osteoarthritis) of hip 03/07/2022   Primary osteoarthritis of left hip 03/07/2022   GERD (gastroesophageal reflux disease) 11/27/2021   Encounter for screening colonoscopy    Benign neoplasm of rectosigmoid junction    Hyperlipidemia 12/18/2017   Essential hypertension 12/17/2016   Chest pain 05/30/2015   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    ONSET DATE: 11/13/22  REFERRING DIAG: G95.9 (ICD-10-CM) - Cervical myelopathy  THERAPY DIAG:  Muscle weakness (generalized)  Other lack of coordination  Pain in left arm  Unsteadiness on feet  Pain in left hand  Other disturbances of skin sensation  Rationale for Evaluation and Treatment: Rehabilitation  PERTINENT HISTORY: 57 year old R handed male with 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 Dr. Jordan Likes expressed concerns of cord contusion due to worsening of myelopathy and MRI spine done which showed post op changes form ACDF C3-C6 and patchy signal abnormality involving the central and left cord at C5/6 concerning for contusion/injury.  PRECAUTIONS: Cervical and Other:  lifting precautions not > 5#  WEIGHT BEARING RESTRICTIONS: No  SUBJECTIVE:   SUBJECTIVE STATEMENT: Pt reports doing the rubber band stretches and prayer stretch consistently.  Pt reports feeling there is a tension in the hand.  Pt accompanied by: self and significant other   PAIN:  Are you having pain?  Not at rest today  FALLS: Has patient fallen in last 6 months? No  LIVING ENVIRONMENT: Lives with: lives with their spouse and 78 yo Lives in: House/apartment Stairs: Yes: Internal: full flight steps; on right going up and External: 4 steps; none Has following equipment at home: Quad cane small base, Walker - 2 wheeled, shower chair, bed side commode, and hand held shower head  PLOF: Independent, Independent with basic ADLs, and Vocation/Vocational requirements: practices law  PATIENT GOALS: pt would like to get arm progressing like leg    OBJECTIVE: (All objective assessments below are from initial evaluation on: 12/06/22 unless otherwise specified.)   HAND DOMINANCE: Right  ADLs: Overall ADLs: requires increased time and thought to complete Transfers/ambulation related to ADLs: Mod I with SBQC Eating: requires assistance to cut food Grooming: Mod I UB Dressing: requiring assistance with buttons LB Dressing: requiring assistance with socks/tying shoes Toileting: Mod I Bathing: Mod I Tub Shower transfers: Mod I, has built in shower seat Equipment: Walk in shower  IADLs: Light housekeeping: has attempted vacuuming without issue Meal Prep: has attempted cooking, spouse or children assisting with retrieving items if >5# Medication management: spouse is filling pillbox and is reminding him to take  MOBILITY STATUS: Needs Assist: Mod I - Supervision with SBQC  POSTURE COMMENTS:  rounded shoulders and forward head  FUNCTIONAL OUTCOME MEASURES: Upper Extremity Functional Scale (UEFS): 33/80 =  41.25% function  UPPER EXTREMITY ROM:    Active ROM Left eval  Left 01/16/23  Shoulder flexion 88 (requiring abduction to achieve) 110  Shoulder abduction 85 90  Shoulder adduction    Shoulder extension    Shoulder internal rotation 90% WNL  Shoulder external rotation 30% 30%  Elbow flexion 100 WNL  Elbow extension WNL WNL  Wrist flexion WNL WNL  Wrist extension To neutral 10*  Wrist ulnar deviation trace 20*  Wrist radial deviation trace 18*  Wrist pronation    Wrist supination To neutral To neutral (without bracing, does have full range when bracing at elbow)  (Blank rows = not tested)  UPPER EXTREMITY MMT:     MMT Left eval Left 01/16/23  Shoulder flexion 2-/5 3-/5  Shoulder abduction    Shoulder adduction    Shoulder extension    Shoulder internal rotation    Shoulder external rotation    Middle trapezius    Lower trapezius    Elbow flexion 4-/5   Elbow extension 4-/5   Wrist flexion 3-/5 3/5  Wrist extension 3-/5 3/5  Wrist ulnar deviation    Wrist radial deviation    Wrist pronation    Wrist supination    (Blank rows = not tested)  HAND FUNCTION: Loose gross grasp, unable to assess strength on dynamometer  COORDINATION: Finger Nose Finger test: able to reach chin to finger with  LUE, decreased shoulder ROM limiting movement 9 Hole Peg test: Right: 23.03 sec; Left: able to place 5 pegs in 2 min time limit (pt needing to use side of container/multiple pegs to leverage to pick up one peg, also with difficulty with manipulation/translation in finger tips to orient to place in peg hole) Box and Blocks:  Right 44 blocks, Left 32 blocks  SENSATION: Reports duller sensation on L compared to R  COGNITION: Overall cognitive status: Within functional limits for tasks assessed  VISION: Subjective report: no changes Baseline vision: Wears glasses for reading only   TODAY'S TREATMENT:                                                                                       01/29/23 UEFS: reviewed results from most recent  administration of UEFS with discussion of areas of most concern functionally to identify functional goals.  Pt reports difficulty with tying a neck tie, sustained grasp on cup of coffee or briefcase in L hand, and concerns with coordination with typing. Typing: engaged in 1 min typing activity with 22 WPM with 5 errors.  Discussed ulnar distribution impacting typing and hitting shift key with L hand.   UE exercise: engaged in isolated finger extension with tapping on table top with flat fingers progressing to curved fingers as need for typing.  OT instructed in thumb opposition and extension with focus on motor control and precision.  Reiterated composite finger flexion with sustained hold ~15 seconds to facilitate increased stretch and ROM.  Reviewed shift and translation with marker and then pen for increased challenge.   01/23/23: He has signs of significant ulnar nerve impingement or problems in the left arm.  OT educates on the importance of decreasing entrapment through stretches and nerve gliding.  Also being consistent with fine motor skills coordination activities to make improvements was also emphasized on.  Because he has significant wasting of his scapular muscles he also lacks proximal stability including almost all external rotation (infraspinatus is very atrophied).  Today he was given a large and comprehensive home exercise program to try to work on nerve gliding, stretching out the tight internal rotators and shoulder, strengthening his entire left arm from his shoulder and scapula down to his hand.  We were only able to do the portions listed below today and very quickly, so they will need reviewed in upcoming sessions.  Additionally, we only had time to discuss strengthening in 2 planes of motion at the shoulder but we will need to add strengthening for biceps triceps forearm wrist and hand (ulnar nerve muscles mainly) as well.  He states understanding education given today and will do his best  to work on it until seen again.  Exercises - Ulnar Nerve Flossing  - 3-4 x daily - 5-10 reps - Seated Scapular Retraction  - 3-4 x daily - 10-15 reps - 5 sec hold - Seated Shoulder Flexion Towel Slide at Table Top  - 4 x daily - 3-5 reps - 15 hold - Seated Shoulder Abduction Towel Slide at Table Top  - 3-4 x daily - 3-5 reps - 15 hold - Seated Shoulder External Rotation  PROM on Table  - 3-4 x daily - 3-5 reps - 15 sec hold - Sleeper Stretch  - 3-4 x daily - 3-5 reps - 15 hold - Standing neck/upper traps stretch  - 4-6 x daily - 3-5 reps - 15 sec hold - Wrist Prayer Stretch  - 4 x daily - 3-5 reps - 15 sec hold - Seated Wrist Flexion Stretch  - 3-4 x daily - 3 reps - 10 second  hold - PUSH KNUCKLES DOWN  - 4 x daily - 3-5 reps - 15 seconds hold - HOOK Stretch  - 4 x daily - 3-5 reps - 15-20 sec hold - BACK KNUCKLE STRETCHES   - 4 x daily - 3-5 reps - 15 sec hold - Seated Finger Composite Flexion Stretch  - 4 x daily - 3-5 reps - 15 hold - Thumb stretch  - 4 x daily - 3-5 reps - 15-20 sec hold - Standing Shoulder Row with Anchored Resistance  - 2-4 x daily - 1-2 sets - 10-15 reps - Sidelying Shoulder External Rotation with Dumbbell  - 3-4 x daily - 3-4 x weekly - 1-2 sets - 10-15 reps  In-Hand Manipulation Skills Rotation:  Hold pen, try to "twirl" like a baton, keeping flat with surface of table. Try going BOTH directions 10x  Flip:  Hold pen in writing position, flip in an arch to "erase" position, then back to "write" position. Do not lift hand off table.  10x  Translation:  Open hand palm up,  put an object in your palm and then use your fingers and thumb to move it to the tips of your fingers, pinched against your thumb.  10x (bigger is easier (fat marker), smaller is harder (penny)) Shift:  Hold pen like a dart, start "shifting" it forward until you are holding it at the base, then shift it backwards until you are holding it at the tip again. 10x (like putting a key in a key hole)     PATIENT EDUCATION: Educion details: ongoing condition specific education and UE ROM and strengatthening exercises Person educated: Patient Education method: Explanation, Demonstration, Verbal cues, and Handouts Education comprehension: verbalized understanding and needs further education  HOME EXERCISE PROGRAM: 01/23/23: Access Code: [email or web address] URL: [email or web address] Date: 01/23/2023 Prepared by: Fannie Knee  01/16/23: Access Code: [email or web address] URL: [email or web address] Date: 01/16/2023 Prepared by: [facility] - Outpatient  Rehab - [facility]  01/09/23: Access Code: [email or web address] URL: [email or web address] Date: 01/09/2023 Prepared by: [facility] - Outpatient  Rehab - [facility]   GOALS: Goals reviewed with patient? Yes  SHORT TERM GOALS: Target date: 01/04/23  Pt will be independent with coordination and ROM HEP. Baseline: Goal status: MET - 12/31/22  2.  Pt will demonstrate increased coordination to be able to place all 9 pegs into peg board during 9 hole peg test in 2 min time limit. Baseline: able to place 5 in 2 min time limit Goal status: MET - 1:06.84 on 12/31/22  3.  Pt will demonstrate increased L shoulder ROM to allow for ability to retreive light weight object from shoulder height cabinet. Baseline: 88* shoulder flexion with abduction Goal status: IN PROGRESS  4.  Pt will demonstrate improved pinch and coordination as needed to complete clothing fasteners (buttons, tying shoes, etc). Baseline:  Goal status: MET - 01/02/23   LONG TERM GOALS: Target date: 02/01/23  Pt will be independent with advanced HEP. Baseline:  Goal status: IN PROGRESS  2.  Pt will demonstrate improved UE functional  use for ADLs as evidenced by increasing box/ blocks score by 6 blocks with LUE Baseline: Right 44 blocks, Left 32 blocks Goal status: MET - 41 blocks on 01/09/23  3.  Pt will demonstrate improved fine motor coordination for  ADLs as evidenced by being able to complete 9 hole peg test (place and remove) in 2 min time limit Baseline: able to place 5 in 2 min time limit Goal status: MET - 1:06.84 on 12/31/22  4.  Pt will demonstrate increased L shoulder ROM and strength to tolerate holding phone to ear for 5 mins phone conversation. Baseline:  Goal status: IN PROGRESS  5.  Pt will report improved LUE functional use as evidenced by increased score on UEFS to > 60%. Baseline: 41.25%  Goal status: IN PROGRESS    ASSESSMENT:  CLINICAL IMPRESSION: Pt demonstrating good carryover of prayer stretch and finger exercises with rubber band. OT reiterated importance of continued stretching to open up movement to allow for increased focus on ROM and strengthening.  Pt demonstrating good understanding of isolated finger movements and expressing desire to continue OT services for further functional return.  Pt will continue to benefit from OT services to address LUE ROM, strengthening, and coordination for ADLs, IADLs, and work skills   PLAN:  OT FREQUENCY: 2x/week  OT DURATION: 8 weeks  PLANNED INTERVENTIONS: self care/ADL training, therapeutic exercise, therapeutic activity, neuromuscular re-education, manual therapy, passive range of motion, balance training, functional mobility training, electrical stimulation, ultrasound, moist heat, cryotherapy, patient/family education, psychosocial skills training, energy conservation, coping strategies training, and DME and/or AE instructions  RECOMMENDED OTHER SERVICES: NA  CONSULTED AND AGREED WITH PLAN OF CARE: Patient  PLAN FOR NEXT SESSION:  Keep emphasizing stretching into internal and external rotation of the shoulder as well as strengthening the external rotators (teres minor and infraspinatus groups).  Review fine motor skills as needed keep on ulnar nerve gliding.    Rosalio Loud, OTR/L 01/29/2023, 11:55 AM

## 2023-01-31 ENCOUNTER — Ambulatory Visit: Payer: Commercial Managed Care - PPO | Admitting: Occupational Therapy

## 2023-01-31 DIAGNOSIS — M79602 Pain in left arm: Secondary | ICD-10-CM | POA: Diagnosis not present

## 2023-01-31 DIAGNOSIS — M6281 Muscle weakness (generalized): Secondary | ICD-10-CM | POA: Diagnosis not present

## 2023-01-31 DIAGNOSIS — R2689 Other abnormalities of gait and mobility: Secondary | ICD-10-CM | POA: Diagnosis not present

## 2023-01-31 DIAGNOSIS — R208 Other disturbances of skin sensation: Secondary | ICD-10-CM | POA: Diagnosis not present

## 2023-01-31 DIAGNOSIS — R2681 Unsteadiness on feet: Secondary | ICD-10-CM | POA: Diagnosis not present

## 2023-01-31 DIAGNOSIS — R278 Other lack of coordination: Secondary | ICD-10-CM | POA: Diagnosis not present

## 2023-01-31 DIAGNOSIS — M79642 Pain in left hand: Secondary | ICD-10-CM | POA: Diagnosis not present

## 2023-01-31 NOTE — Therapy (Signed)
OUTPATIENT OCCUPATIONAL THERAPY NEURO  Treatment Session  Patient Name: Johnny Robinson MRN: [id number] DOB:[birth year], 57 y.o., male Today's Date: 01/31/2023  PCP: Marguarite Arbour, MD REFERRING PROVIDER: Jacquelynn Cree, PA-C    END OF SESSION:  OT End of Session - 01/31/23 1152     Visit Number 14    Number of Visits 22    Date for OT Re-Evaluation 03/14/23    Authorization Type [employer] employee - [employer] P[employer]    OT Start Time 1150    OT Stop Time 1230    OT Time Calculation (min) 40 min    Activity Tolerance Patient tolerated treatment well;No increased pain;Patient limited by fatigue    Behavior During Therapy [facility] for tasks assessed/performed               Past Medical History:  Diagnosis Date   Arthritis    Chronic kidney disease 2016   RENAL INFARCT   GERD (gastroesophageal reflux disease)    Hypertension    Reflux    Subdural hematoma (HCC) 1991   Past Surgical History:  Procedure Laterality Date   BACK SURGERY  11/2017   burr hole  1992   to evacuate SDH   COLONOSCOPY WITH PROPOFOL N/A 09/02/2018   Procedure: COLONOSCOPY WITH PROPOFOL;  Surgeon: Midge Minium, MD;  Location: [facility] ENDOSCOPY;  Service: Endoscopy;  Laterality: N/A;   EXCISION MASS ABDOMINAL Left 05/28/2018   Procedure: EXCISIONAL BIOPSY MASS ABDOMINAL WALL;  Surgeon: Carolan Shiver, MD;  Location: [facility] ORS;  Service: General;  Laterality: Left;   PERIPHERAL VASCULAR CATHETERIZATION Right 05/18/2015   Procedure: Renal Angiography;  Surgeon: Annice Needy, MD;  Location: [facility] INVASIVE CV LAB;  Service: Cardiovascular;  Laterality: Right;   PERIPHERAL VASCULAR CATHETERIZATION Bilateral 05/18/2015   Procedure: Renal Intervention;  Surgeon: Annice Needy, MD;  Location: [facility] INVASIVE CV LAB;  Service: Cardiovascular;  Laterality: Bilateral;   TOTAL HIP ARTHROPLASTY Left 03/07/2022   Procedure: TOTAL HIP ARTHROPLASTY ANTERIOR APPROACH;  Surgeon: Ollen Gross, MD;  Location: [facility] ORS;  Service:  Orthopedics;  Laterality: Left;   Patient Active Problem List   Diagnosis Date Noted   Nerve pain 12/31/2022   Left shoulder pain 11/30/2022   Urinary hesitancy 11/30/2022   Constipation 11/30/2022   Biceps tendinitis of left shoulder 11/30/2022   Multifocal pneumonia 11/30/2022   Chronic pain syndrome 11/21/2022   HNP (herniated nucleus pulposus) with myelopathy, cervical 11/15/2022   Cervical myelopathy (HCC) 11/13/2022   OA (osteoarthritis) of hip 03/07/2022   Primary osteoarthritis of left hip 03/07/2022   GERD (gastroesophageal reflux disease) 11/27/2021   Encounter for screening colonoscopy    Benign neoplasm of rectosigmoid junction    Hyperlipidemia 12/18/2017   Essential hypertension 12/17/2016   Chest pain 05/30/2015   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    ONSET DATE: 11/13/22  REFERRING DIAG: G95.9 (ICD-10-CM) - Cervical myelopathy  THERAPY DIAG:  Muscle weakness (generalized)  Other lack of coordination  Pain in left arm  Rationale for Evaluation and Treatment: Rehabilitation  PERTINENT HISTORY: 57 year old R handed male with 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 Dr. Jordan Likes expressed concerns of cord contusion due to worsening of  myelopathy and MRI spine done which showed post op changes form ACDF C3-C6 and patchy signal abnormality involving the central and left cord at C5/6 concerning for contusion/injury.  PRECAUTIONS: Cervical and Other: lifting precautions not > 5#  WEIGHT BEARING RESTRICTIONS: No  SUBJECTIVE:   SUBJECTIVE STATEMENT: Pt reports having to work this morning and then heading to the [place]  house this afternoon.  Pt accompanied by: self and significant other   PAIN:  Are you having pain?  Not at rest today  FALLS: Has patient fallen in last 6 months? No  LIVING ENVIRONMENT: Lives with: lives with their spouse and 20 yo Lives in: House/apartment Stairs: Yes: Internal: full flight steps; on right going up and External: 4 steps; none Has following equipment at home: Quad cane small base, Walker - 2 wheeled, shower chair, bed side commode, and hand held shower head  PLOF: Independent, Independent with basic ADLs, and Vocation/Vocational requirements: practices law  PATIENT GOALS: pt would like to get arm progressing like leg    OBJECTIVE: (All objective assessments below are from initial evaluation on: 12/06/22 unless otherwise specified.)   HAND DOMINANCE: Right  ADLs: Overall ADLs: requires increased time and thought to complete Transfers/ambulation related to ADLs: Mod I with SBQC Eating: requires assistance to cut food Grooming: Mod I UB Dressing: requiring assistance with buttons LB Dressing: requiring assistance with socks/tying shoes Toileting: Mod I Bathing: Mod I Tub Shower transfers: Mod I, has built in shower seat Equipment: Walk in shower  IADLs: Light housekeeping: has attempted vacuuming without issue Meal Prep: has attempted cooking, spouse or children assisting with retrieving items if >5# Medication management: spouse is filling pillbox and is reminding him to take  MOBILITY STATUS: Needs Assist: Mod I - Supervision with SBQC  POSTURE COMMENTS:  rounded shoulders and forward head  FUNCTIONAL OUTCOME MEASURES: Upper Extremity Functional Scale (UEFS): 33/80 =  41.25% function  UPPER EXTREMITY ROM:    Active ROM Left eval Left 01/16/23  Shoulder flexion 88 (requiring abduction to achieve) 110  Shoulder abduction 85 90  Shoulder adduction    Shoulder extension    Shoulder internal rotation 90% WNL  Shoulder external rotation 30% 30%   Elbow flexion 100 WNL  Elbow extension WNL WNL  Wrist flexion WNL WNL  Wrist extension To neutral 10*  Wrist ulnar deviation trace 20*  Wrist radial deviation trace 18*  Wrist pronation    Wrist supination To neutral To neutral (without bracing, does have full range when bracing at elbow)  (Blank rows = not tested)  UPPER EXTREMITY MMT:     MMT Left eval Left 01/16/23  Shoulder flexion 2-/5 3-/5  Shoulder abduction    Shoulder adduction    Shoulder extension    Shoulder internal rotation    Shoulder external rotation    Middle trapezius    Lower trapezius    Elbow flexion 4-/5   Elbow extension 4-/5   Wrist flexion 3-/5 3/5  Wrist extension 3-/5 3/5  Wrist ulnar deviation    Wrist radial deviation    Wrist pronation    Wrist supination    (Blank rows = not tested)  HAND FUNCTION: Loose gross grasp, unable to assess strength on dynamometer  COORDINATION: Finger Nose Finger test: able to reach chin to finger with LUE, decreased shoulder ROM limiting movement 9 Hole Peg test: Right: 23.03 sec; Left: able to place 5 pegs in 2 min time limit (pt needing to use side of container/multiple pegs to leverage  to pick up one peg, also with difficulty with manipulation/translation in finger tips to orient to place in peg hole) Box and Blocks:  Right 44 blocks, Left 32 blocks  SENSATION: Reports duller sensation on L compared to R  COGNITION: Overall cognitive status: Within functional limits for tasks assessed  VISION: Subjective report: no changes Baseline vision: Wears glasses for reading only   TODAY'S TREATMENT:                                                                                       01/31/23 UE ROM: engaged in exercises to target external rotation and strengthening of teres minor and infraspinatus groups.  Pt continues to demonstrate decreased external rotation in all planes, complicating complete participation in ulnar nerve flossing.  Pt able to complete  tasks with use of door frame, wall, and or pushing up from chair and in gravity eliminated and/or with hand over hand assist.   - Ulnar nerve flossing - Standing  Push Up with Scapular Retraction at Wall   - Standing Shoulder External Rotation Stretch in Doorway   - Shoulder External Rotation and Scapular Retraction   - Seated Chair Push Ups  Coordination: w/ LUE including: rotation of 2 golf balls in hand (clockwise and counter clockwise), in-hand manipulation with discussion of use of slightly larger items to increase control (stones, checkers, poker chips) completed with stones during session, and reiteration of "pen tricks" to continues to address in-hand manipulation throughout the day.   01/29/23 UEFS: reviewed results from most recent administration of UEFS with discussion of areas of most concern functionally to identify functional goals.  Pt reports difficulty with tying a neck tie, sustained grasp on cup of coffee or briefcase in L hand, and concerns with coordination with typing. Typing: engaged in 1 min typing activity with 22 WPM with 5 errors.  Discussed ulnar distribution impacting typing and hitting shift key with L hand.   UE exercise: engaged in isolated finger extension with tapping on table top with flat fingers progressing to curved fingers as need for typing.  OT instructed in thumb opposition and extension with focus on motor control and precision.  Reiterated composite finger flexion with sustained hold ~15 seconds to facilitate increased stretch and ROM.  Reviewed shift and translation with marker and then pen for increased challenge.   01/23/23: He has signs of significant ulnar nerve impingement or problems in the left arm.  OT educates on the importance of decreasing entrapment through stretches and nerve gliding.  Also being consistent with fine motor skills coordination activities to make improvements was also emphasized on.  Because he has significant wasting of his  scapular muscles he also lacks proximal stability including almost all external rotation (infraspinatus is very atrophied).  Today he was given a large and comprehensive home exercise program to try to work on nerve gliding, stretching out the tight internal rotators and shoulder, strengthening his entire left arm from his shoulder and scapula down to his hand.  We were only able to do the portions listed below today and very quickly, so they will need reviewed in upcoming sessions.  Additionally, we only had  time to discuss strengthening in 2 planes of motion at the shoulder but we will need to add strengthening for biceps triceps forearm wrist and hand (ulnar nerve muscles mainly) as well.  He states understanding education given today and will do his best to work on it until seen again.  Exercises - Ulnar Nerve Flossing  - 3-4 x daily - 5-10 reps - Seated Scapular Retraction  - 3-4 x daily - 10-15 reps - 5 sec hold - Seated Shoulder Flexion Towel Slide at Table Top  - 4 x daily - 3-5 reps - 15 hold - Seated Shoulder Abduction Towel Slide at Table Top  - 3-4 x daily - 3-5 reps - 15 hold - Seated Shoulder External Rotation PROM on Table  - 3-4 x daily - 3-5 reps - 15 sec hold - Sleeper Stretch  - 3-4 x daily - 3-5 reps - 15 hold - Standing neck/upper traps stretch  - 4-6 x daily - 3-5 reps - 15 sec hold - Wrist Prayer Stretch  - 4 x daily - 3-5 reps - 15 sec hold - Seated Wrist Flexion Stretch  - 3-4 x daily - 3 reps - 10 second  hold - PUSH KNUCKLES DOWN  - 4 x daily - 3-5 reps - 15 seconds hold - HOOK Stretch  - 4 x daily - 3-5 reps - 15-20 sec hold - BACK KNUCKLE STRETCHES   - 4 x daily - 3-5 reps - 15 sec hold - Seated Finger Composite Flexion Stretch  - 4 x daily - 3-5 reps - 15 hold - Thumb stretch  - 4 x daily - 3-5 reps - 15-20 sec hold - Standing Shoulder Row with Anchored Resistance  - 2-4 x daily - 1-2 sets - 10-15 reps - Sidelying Shoulder External Rotation with Dumbbell  - 3-4 x daily  - 3-4 x weekly - 1-2 sets - 10-15 reps  In-Hand Manipulation Skills Rotation:  Hold pen, try to "twirl" like a baton, keeping flat with surface of table. Try going BOTH directions 10x  Flip:  Hold pen in writing position, flip in an arch to "erase" position, then back to "write" position. Do not lift hand off table.  10x  Translation:  Open hand palm up,  put an object in your palm and then use your fingers and thumb to move it to the tips of your fingers, pinched against your thumb.  10x (bigger is easier (fat marker), smaller is harder (penny)) Shift:  Hold pen like a dart, start "shifting" it forward until you are holding it at the base, then shift it backwards until you are holding it at the tip again. 10x (like putting a key in a key hole)    PATIENT EDUCATION: Educion details: ongoing condition specific education and UE ROM and strengatthening exercises Person educated: Patient Education method: Explanation, Demonstration, Verbal cues, and Handouts Education comprehension: verbalized understanding and needs further education  HOME EXERCISE PROGRAM: Access Code: [email or web address] URL: [email or web address] Date: 01/31/2023 Prepared by: [facility] - Outpatient  Rehab - [facility]  01/23/23: Access Code: [email or web address] URL: [email or web address] Date: 01/23/2023 Prepared by: Fannie Knee  01/16/23: Access Code: [email or web address] URL: [email or web address] Date: 01/16/2023 Prepared by: [facility] - Outpatient  Rehab - [facility]  01/09/23: Access Code: [email or web address] URL: [email or web address] Date: 01/09/2023 Prepared by: [facility] - Outpatient  Rehab - [facility]   GOALS: Goals reviewed with patient? Yes  SHORT TERM GOALS: Target date: 01/04/23  Pt will be independent with coordination and  ROM HEP. Baseline: Goal status: MET - 12/31/22  2.  Pt will demonstrate increased coordination to be able to place all 9 pegs into peg  board during 9 hole peg test in 2 min time limit. Baseline: able to place 5 in 2 min time limit Goal status: MET - 1:06.84 on 12/31/22  3.  Pt will demonstrate increased L shoulder ROM to allow for ability to retreive light weight object from shoulder height cabinet. Baseline: 88* shoulder flexion with abduction Goal status: IN PROGRESS  4.  Pt will demonstrate improved pinch and coordination as needed to complete clothing fasteners (buttons, tying shoes, etc). Baseline:  Goal status: MET - 01/02/23   LONG TERM GOALS: Target date: 02/01/23  Pt will be independent with advanced HEP. Baseline:  Goal status: IN PROGRESS  2.  Pt will demonstrate improved UE functional use for ADLs as evidenced by increasing box/ blocks score by 6 blocks with LUE Baseline: Right 44 blocks, Left 32 blocks Goal status: MET - 41 blocks on 01/09/23  3.  Pt will demonstrate improved fine motor coordination for ADLs as evidenced by being able to complete 9 hole peg test (place and remove) in 2 min time limit Baseline: able to place 5 in 2 min time limit Goal status: MET - 1:06.84 on 12/31/22  4.  Pt will demonstrate increased L shoulder ROM and strength to tolerate holding phone to ear for 5 mins phone conversation. Baseline:  Goal status: IN PROGRESS  5.  Pt will report improved LUE functional use as evidenced by increased score on UEFS to > 60%. Baseline: 41.25%  Goal status: IN PROGRESS  NEW LONG TERM GOALS: Target date: 03/14/23  Pt will be independent with advanced HEP. Baseline:  Goal status: INITIAL  2.  Pt will demonstrate improved fine motor coordination for ADLs as evidenced by being able to complete 9 hole peg test in 45 seconds or less Baseline: 1:06.84 on 12/31/22 Goal status: INITIAL  3.  Pt will demonstrate increased L shoulder ROM and strength to tolerate holding phone to ear for 5 mins phone conversation. Baseline:  Goal status: IN PROGRESS  4.  Pt will report improved LUE functional  use as evidenced by increased score on UEFS to > 70%. Baseline: 57.5% on 01/21/23 Goal status: Revised  5. Pt will report improved shoulder ROM and strength to be able to tie a neck tie.  Baseline:  Goal status: INITIAL  6.  Pt will demonstrate improved sustained grasp in L hand to carry a coffee cup 50' without spilling.  Baseline:  Goal status: INITIAL  7.  Pt will demonstrate and/or report improved coordination to demonstrate improved ease/speed of typing by increased WPM >/= to 10 WPM  Baseline: 22  Goal status: INITIAL    ASSESSMENT:  CLINICAL IMPRESSION: Pt demonstrating difficulty with ulnar nerve flossing therefore, engaged in exercises this session to facilitate increased external rotation and strengthening through teres minor and infraspinatus groups.  OT reiterated importance of continued stretching to open up movement to allow for increased focus on ROM and strengthening. Pt demonstrating wall pushups and stretches in door frame to allow for increased strengthening and stretch without gravity.  Pt continues to demonstrate weakness and instability in L shoulder and upper back and impaired coordination and motor control against gravity, therefore pt will continue to benefit from OT services to address LUE ROM, strengthening, and coordination for ADLs, IADLs, and work skills   PLAN:  OT FREQUENCY: 2x/week  OT DURATION: 6 weeks (  plan to only be seen for 4 weeks but asking for 6 due to scheduling conflicts)  PLANNED INTERVENTIONS: self care/ADL training, therapeutic exercise, therapeutic activity, neuromuscular re-education, manual therapy, passive range of motion, balance training, functional mobility training, electrical stimulation, ultrasound, moist heat, cryotherapy, patient/family education, psychosocial skills training, energy conservation, coping strategies training, and DME and/or AE instructions  RECOMMENDED OTHER SERVICES: NA  CONSULTED AND AGREED WITH PLAN OF CARE:  Patient  PLAN FOR NEXT SESSION:  Keep emphasizing stretching into internal and external rotation of the shoulder as well as strengthening the external rotators (teres minor and infraspinatus groups).  Review fine motor skills as needed keep on ulnar nerve gliding.  Need to consolidate HEP into one.  Rosalio Loud, OTR/L 01/31/2023, 12:49 PM

## 2023-02-04 ENCOUNTER — Ambulatory Visit: Payer: Commercial Managed Care - PPO | Admitting: Occupational Therapy

## 2023-02-04 DIAGNOSIS — M79602 Pain in left arm: Secondary | ICD-10-CM | POA: Diagnosis not present

## 2023-02-04 DIAGNOSIS — M6281 Muscle weakness (generalized): Secondary | ICD-10-CM | POA: Diagnosis not present

## 2023-02-04 DIAGNOSIS — R208 Other disturbances of skin sensation: Secondary | ICD-10-CM

## 2023-02-04 DIAGNOSIS — M79642 Pain in left hand: Secondary | ICD-10-CM | POA: Diagnosis not present

## 2023-02-04 DIAGNOSIS — R2681 Unsteadiness on feet: Secondary | ICD-10-CM | POA: Diagnosis not present

## 2023-02-04 DIAGNOSIS — R2689 Other abnormalities of gait and mobility: Secondary | ICD-10-CM | POA: Diagnosis not present

## 2023-02-04 DIAGNOSIS — R278 Other lack of coordination: Secondary | ICD-10-CM | POA: Diagnosis not present

## 2023-02-04 NOTE — Therapy (Signed)
OUTPATIENT OCCUPATIONAL THERAPY NEURO  Treatment Session  Patient Name: Johnny Robinson MRN: [id number] DOB:[birth year], 57 y.o., male Today's Date: 02/04/2023  PCP: Marguarite Arbour, MD REFERRING PROVIDER: Jacquelynn Cree, PA-C    END OF SESSION:  OT End of Session - 02/04/23 1108     Visit Number 15    Number of Visits 22    Date for OT Re-Evaluation 03/14/23    Authorization Type [employer] employee - [employer]    OT Start Time 1103    OT Stop Time 1145    OT Time Calculation (min) 42 min    Activity Tolerance Patient tolerated treatment well;No increased pain;Patient limited by fatigue    Behavior During Therapy [facility] for tasks assessed/performed               Past Medical History:  Diagnosis Date   Arthritis    Chronic kidney disease 2016   RENAL INFARCT   GERD (gastroesophageal reflux disease)    Hypertension    Reflux    Subdural hematoma (HCC) 1991   Past Surgical History:  Procedure Laterality Date   BACK SURGERY  11/2017   burr hole  1992   to evacuate SDH   COLONOSCOPY WITH PROPOFOL N/A 09/02/2018   Procedure: COLONOSCOPY WITH PROPOFOL;  Surgeon: Midge Minium, MD;  Location: [facility];  Service: Endoscopy;  Laterality: N/A;   EXCISION MASS ABDOMINAL Left 05/28/2018   Procedure: EXCISIONAL BIOPSY MASS ABDOMINAL WALL;  Surgeon: Carolan Shiver, MD;  Location: [facility] ORS;  Service: General;  Laterality: Left;   PERIPHERAL VASCULAR CATHETERIZATION Right 05/18/2015   Procedure: Renal Angiography;  Surgeon: Annice Needy, MD;  Location: [facility] INVASIVE CV LAB;  Service: Cardiovascular;  Laterality: Right;   PERIPHERAL VASCULAR CATHETERIZATION Bilateral 05/18/2015   Procedure: Renal Intervention;  Surgeon: Annice Needy, MD;  Location: [facility] INVASIVE CV LAB;  Service: Cardiovascular;  Laterality: Bilateral;   TOTAL HIP ARTHROPLASTY Left 03/07/2022   Procedure: TOTAL HIP ARTHROPLASTY ANTERIOR APPROACH;  Surgeon: Ollen Gross, MD;  Location: [facility] OR[facility];  Service:  Orthopedics;  Laterality: Left;   Patient Active Problem List   Diagnosis Date Noted   Nerve pain 12/31/2022   Left shoulder pain 11/30/2022   Urinary hesitancy 11/30/2022   Constipation 11/30/2022   Biceps tendinitis of left shoulder 11/30/2022   Multifocal pneumonia 11/30/2022   Chronic pain syndrome 11/21/2022   HNP (herniated nucleus pulposus) with myelopathy, cervical 11/15/2022   Cervical myelopathy (HCC) 11/13/2022   OA (osteoarthritis) of hip 03/07/2022   Primary osteoarthritis of left hip 03/07/2022   GERD (gastroesophageal reflux disease) 11/27/2021   Encounter for screening colonoscopy    Benign neoplasm of rectosigmoid junction    Hyperlipidemia 12/18/2017   Essential hypertension 12/17/2016   Chest pain 05/30/2015   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    ONSET DATE: 11/13/22  REFERRING DIAG: G95.9 (ICD-10-CM) - Cervical myelopathy  THERAPY DIAG:  Other lack of coordination  Muscle weakness (generalized)  Pain in left arm  Other disturbances of skin sensation  Rationale for Evaluation and Treatment: Rehabilitation  PERTINENT HISTORY: 57 year old R handed male with 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 Dr. Jordan Likes expressed concerns of  cord contusion due to worsening of myelopathy and MRI spine done which showed post op changes form ACDF C3-C6 and patchy signal abnormality involving the central and left cord at C5/6 concerning for contusion/injury.  PRECAUTIONS: Cervical and Other: lifting precautions not > 5#  WEIGHT BEARING RESTRICTIONS: No  SUBJECTIVE:   SUBJECTIVE STATEMENT: Pt reports doing some gardening work  over the weekend.  Pt accompanied by: self   PAIN:  Are you having pain?  Not at rest today  FALLS: Has patient fallen in last 6 months? No  LIVING ENVIRONMENT: Lives with: lives with their spouse and 62 yo Lives in: House/apartment Stairs: Yes: Internal: full flight steps; on right going up and External: 4 steps; none Has following equipment at home: Quad cane small base, Walker - 2 wheeled, shower chair, bed side commode, and hand held shower head  PLOF: Independent, Independent with basic ADLs, and Vocation/Vocational requirements: practices law  PATIENT GOALS: pt would like to get arm progressing like leg    OBJECTIVE: (All objective assessments below are from initial evaluation on: 12/06/22 unless otherwise specified.)   HAND DOMINANCE: Right  ADLs: Overall ADLs: requires increased time and thought to complete Transfers/ambulation related to ADLs: Mod I with SBQC Eating: requires assistance to cut food Grooming: Mod I UB Dressing: requiring assistance with buttons LB Dressing: requiring assistance with socks/tying shoes Toileting: Mod I Bathing: Mod I Tub Shower transfers: Mod I, has built in shower seat Equipment: Walk in shower  IADLs: Light housekeeping: has attempted vacuuming without issue Meal Prep: has attempted cooking, spouse or children assisting with retrieving items if >5# Medication management: spouse is filling pillbox and is reminding him to take  MOBILITY STATUS: Needs Assist: Mod I - Supervision with SBQC  POSTURE COMMENTS:  rounded shoulders and forward head  FUNCTIONAL OUTCOME MEASURES: Upper Extremity Functional Scale (UEFS): 33/80 =  41.25% function  UPPER EXTREMITY ROM:    Active ROM Left eval Left 01/16/23  Shoulder flexion 88 (requiring abduction to achieve) 110  Shoulder abduction 85 90  Shoulder adduction    Shoulder extension    Shoulder internal rotation 90% WNL  Shoulder external rotation 30% 30%  Elbow flexion 100 WNL   Elbow extension WNL WNL  Wrist flexion WNL WNL  Wrist extension To neutral 10*  Wrist ulnar deviation trace 20*  Wrist radial deviation trace 18*  Wrist pronation    Wrist supination To neutral To neutral (without bracing, does have full range when bracing at elbow)  (Blank rows = not tested)  UPPER EXTREMITY MMT:     MMT Left eval Left 01/16/23  Shoulder flexion 2-/5 3-/5  Shoulder abduction    Shoulder adduction    Shoulder extension    Shoulder internal rotation    Shoulder external rotation    Middle trapezius    Lower trapezius    Elbow flexion 4-/5   Elbow extension 4-/5   Wrist flexion 3-/5 3/5  Wrist extension 3-/5 3/5  Wrist ulnar deviation    Wrist radial deviation    Wrist pronation    Wrist supination    (Blank rows = not tested)  HAND FUNCTION: Loose gross grasp, unable to assess strength on dynamometer  COORDINATION: Finger Nose Finger test: able to reach chin to finger with LUE, decreased shoulder ROM limiting movement 9 Hole Peg test: Right: 23.03 sec; Left: able to place 5 pegs in 2 min time limit (pt needing to use side of container/multiple pegs to leverage to pick up one peg,  also with difficulty with manipulation/translation in finger tips to orient to place in peg hole) Box and Blocks:  Right 44 blocks, Left 32 blocks  SENSATION: Reports duller sensation on L compared to R  COGNITION: Overall cognitive status: Within functional limits for tasks assessed  VISION: Subjective report: no changes Baseline vision: Wears glasses for reading only   TODAY'S TREATMENT:                                                                                       02/04/23 Coordination: placing pegs of various sizes into peg board challenging coordination by completing rotation before placing into peg holes.  Pt demonstrating increased difficulty with smaller pegs but able to complete with increased time.  Therapist modified positioning of peg board to further  facilitate increased shoulder flexion and abduction with ability to maintain UE in that position.  Engaged in picking up coins with focus on forearm supination to place coins into coin slot.  Removed pegs from peg board and placing into cup at high range to facilitate increased shoulder flexion and functional reach. Engaged in in-hand manipulation and translation from finger tips to palm with larger pegs to facilitate increased control and closure of hand. 9 hole peg test: 54.38 sec     01/31/23 UE ROM: engaged in exercises to target external rotation and strengthening of teres minor and infraspinatus groups.  Pt continues to demonstrate decreased external rotation in all planes, complicating complete participation in ulnar nerve flossing.  Pt able to complete tasks with use of door frame, wall, and or pushing up from chair and in gravity eliminated and/or with hand over hand assist.   - Ulnar nerve flossing - Standing  Push Up with Scapular Retraction at Wall   - Standing Shoulder External Rotation Stretch in Doorway   - Shoulder External Rotation and Scapular Retraction   - Seated Chair Push Ups  Coordination: w/ LUE including: rotation of 2 golf balls in hand (clockwise and counter clockwise), in-hand manipulation with discussion of use of slightly larger items to increase control (stones, checkers, poker chips) completed with stones during session, and reiteration of "pen tricks" to continues to address in-hand manipulation throughout the day.   01/29/23 UEFS: reviewed results from most recent administration of UEFS with discussion of areas of most concern functionally to identify functional goals.  Pt reports difficulty with tying a neck tie, sustained grasp on cup of coffee or briefcase in L hand, and concerns with coordination with typing. Typing: engaged in 1 min typing activity with 22 WPM with 5 errors.  Discussed ulnar distribution impacting typing and hitting shift key with L hand.   UE  exercise: engaged in isolated finger extension with tapping on table top with flat fingers progressing to curved fingers as need for typing.  OT instructed in thumb opposition and extension with focus on motor control and precision.  Reiterated composite finger flexion with sustained hold ~15 seconds to facilitate increased stretch and ROM.  Reviewed shift and translation with marker and then pen for increased challenge.   PATIENT EDUCATION: Educion details: ongoing condition specific education and UE ROM and strengatthening exercises Person educated:  Patient Education method: Explanation, Demonstration, Verbal cues, and Handouts Education comprehension: verbalized understanding and needs further education  HOME EXERCISE PROGRAM: Access Code: [email or web address] URL: [email or web address] Date: 01/31/2023 Prepared by: [facility] - Outpatient  Rehab - [facility]  01/23/23: Access Code: [email or web address] URL: [email or web address] Date: 01/23/2023 Prepared by: Fannie Knee  01/16/23: Access Code: [email or web address] URL: [email or web address] Date: 01/16/2023 Prepared by: [facility] - Outpatient  Rehab - [facility]  01/09/23: Access Code: [email or web address] URL: [email or web address] Date: 01/09/2023 Prepared by: [facility] - Outpatient  Rehab - [facility]   GOALS: Goals reviewed with patient? Yes  SHORT TERM GOALS: Target date: 01/04/23  Pt will be independent with coordination and ROM HEP. Baseline: Goal status: MET - 12/31/22  2.  Pt will demonstrate increased coordination to be able to place all 9 pegs into peg board during 9 hole peg test in 2 min time limit. Baseline: able to place 5 in 2 min time limit Goal status: MET - 1:06.84 on 12/31/22  3.  Pt will demonstrate increased L shoulder ROM to allow for ability to retreive light weight object from shoulder height cabinet. Baseline: 88* shoulder flexion with abduction Goal status: IN  PROGRESS  4.  Pt will demonstrate improved pinch and coordination as needed to complete clothing fasteners (buttons, tying shoes, etc). Baseline:  Goal status: MET - 01/02/23   LONG TERM GOALS:  Target date: 03/14/23  Pt will be independent with advanced HEP. Baseline:  Goal status: IN PROGRESS  2.  Pt will demonstrate improved fine motor coordination for ADLs as evidenced by being able to complete 9 hole peg test in 45 seconds or less Baseline: 1:06.84 on 12/31/22 Goal status: IN PROGRESS  3.  Pt will demonstrate increased L shoulder ROM and strength to tolerate holding phone to ear for 5 mins phone conversation. Baseline:  Goal status: IN PROGRESS  4.  Pt will report improved LUE functional use as evidenced by increased score on UEFS to > 70%. Baseline: 57.5% on 01/21/23 Goal status: IN PROGRESS  5. Pt will report improved shoulder ROM and strength to be able to tie a neck tie.  Baseline:  Goal status: IN PROGRESS  6.  Pt will demonstrate improved sustained grasp in L hand to carry a coffee cup 50' without spilling.  Baseline:  Goal status: IN PROGRESS  7.  Pt will demonstrate and/or report improved coordination to demonstrate improved ease/speed of typing by increased WPM >/= to 10 WPM  Baseline: 22  Goal status: IN PROGRESS    ASSESSMENT:  CLINICAL IMPRESSION: Pt tolerating coordination tasks this session, reporting min pain with repetition with overhead reaching but able to complete with rest break. Pt demonstrating ability to manipulate large and medium pegs in hand with rotation and translation to incorporate increased shoulder flexion.  Pt continues to demonstrate weakness and instability in L shoulder and upper back and impaired coordination and motor control against gravity, therefore plan to attempt tasks in supine and/or prone next session.   PLAN:  OT FREQUENCY: 2x/week  OT DURATION: 6 weeks (plan to only be seen for 4 weeks but asking for 6 due to scheduling  conflicts)  PLANNED INTERVENTIONS: self care/ADL training, therapeutic exercise, therapeutic activity, neuromuscular re-education, manual therapy, passive range of motion, balance training, functional mobility training, electrical stimulation, ultrasound, moist heat, cryotherapy, patient/family education, psychosocial skills training, energy conservation, coping strategies training, and DME and/or AE instructions  RECOMMENDED OTHER SERVICES: NA  CONSULTED AND AGREED WITH PLAN OF CARE: Patient  PLAN FOR  NEXT SESSION:  Keep emphasizing stretching into internal and external rotation of the shoulder as well as strengthening the external rotators (teres minor and infraspinatus groups).  Review fine motor skills as needed keep on ulnar nerve gliding.  Need to consolidate HEP into one.  Plan to complete exercises in supine and/or prone next session.  Rosalio Loud, OTR/L 02/04/2023, 11:58 AM

## 2023-02-05 NOTE — Progress Notes (Signed)
Midge Minium, MD [address]  [address]  [place], [place] [address]  Main: [phone]  Fax: [phone]    Gastroenterology Virtual/Video Visit  Referring Provider:     Marguarite Arbour, MD Primary Care Physician:  Marguarite Arbour, MD Primary Gastroenterologist:  Dr.Yudith Norlander Servando Snare Reason for Consultation:     GERD        HPI:    Virtual Visit via Video Note Location of the patient: Home Location of provider: Home Office Participating persons: The patient and myself.  I connected with Johnny Robinson on 02/05/23 at  8:15 AM EDT by a video enabled telemedicine application and verified that I am speaking with the correct person using two identifiers.   I discussed the limitations of evaluation and management by telemedicine and the availability of in person appointments. The patient expressed understanding and agreed to proceed.  Verbal consent to proceed obtained.  History of Present Illness: Johnny Robinson is a 57 y.o. male referred by Dr. Judithann Sheen, Duane Lope, MD  for consultation & management of GERD.  This patient had a colonoscopy by me back in 2019.  At that time the patient was found to have 5 polyps in the colon that were removed.  The polyps were found to be hyperplastic.  It was recommended that the patient have a repeat colonoscopy in 10 years due to the patient's colonoscopy indications being for screening purposes.  The patient's wife had called to set up a repeat colonoscopy and was informed that a referral has been put in for the patient's GERD symptoms.  It appears that the patient's medications include Prilosec 40 mg a day. He had neck surgery and had fusion and since then has had dysphagia. He reports that was told my the surgeon that it was due to the surgery. The patient reports that his symptoms are more with solids and they are liquids.  He denies any unexplained weight loss.  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  Past Medical History:  Diagnosis Date   Arthritis    Chronic kidney disease 2016   RENAL INFARCT   GERD (gastroesophageal reflux disease)    Hypertension    Reflux    Subdural hematoma (HCC) 1991    Past Surgical History:  Procedure Laterality Date   BACK SURGERY  11/2017   burr hole  1992   to evacuate SDH   COLONOSCOPY WITH PROPOFOL N/A 09/02/2018   Procedure: COLONOSCOPY WITH PROPOFOL;  Surgeon: Midge Minium, MD;  Location: [facility] ENDOSCOPY;  Service: Endoscopy;  Laterality: N/A;   EXCISION MASS ABDOMINAL Left 05/28/2018   Procedure: EXCISIONAL BIOPSY MASS ABDOMINAL WALL;  Surgeon: Carolan Shiver, MD;  Location: [facility] ORS;  Service: General;  Laterality: Left;   PERIPHERAL VASCULAR CATHETERIZATION Right 05/18/2015   Procedure: Renal Angiography;  Surgeon: Annice Needy, MD;  Location: [facility] INVASIVE CV LAB;  Service: Cardiovascular;  Laterality: Right;   PERIPHERAL VASCULAR CATHETERIZATION Bilateral 05/18/2015   Procedure: Renal Intervention;  Surgeon: Annice Needy, MD;  Location: [facility] INVASIVE CV LAB;  Service: Cardiovascular;  Laterality: Bilateral;   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 Authorizing Provider  aspirin EC 81 MG tablet Take 81 mg  by mouth daily.    [provider]  atorvastatin (LIPITOR) 20 MG tablet Take 1 tablet (20 mg total) by mouth daily. 01/04/23     cyclobenzaprine (FLEXERIL) 5 MG tablet Take 1 tablet (5 mg total) by mouth 3 (three) times daily as needed for muscle spasms. 11/29/22   Love, Evlyn Kanner, PA-C  diclofenac Sodium (VOLTAREN) 1 % GEL Apply 4 g topically 4 (four) times daily. 11/29/22   Love, Evlyn Kanner, PA-C  DULoxetine (CYMBALTA) 20 MG capsule Take 1 capsule by mouth daily. 12/06/22   [provider]   DULoxetine (CYMBALTA) 20 MG capsule Take 1 capsule (20 mg total) by mouth daily. 01/07/23   Judi Saa, DO  gabapentin (NEURONTIN) 100 MG capsule Take by mouth. Patient not taking: Reported on 12/31/2022    [provider]  gabapentin (NEURONTIN) 300 MG capsule Take 1 capsule (300 mg total) by mouth 3 (three) times daily. 12/31/22   Lovorn, Aundra Millet, MD  HYDROcodone-acetaminophen (NORCO) 10-325 MG tablet Take 1 tablet by mouth every 6 (six) hours as needed. 12/10/22   [provider]  metoprolol succinate (TOPROL-XL) 25 MG 24 hr tablet Take 1 tablet (25 mg total) by mouth once daily 02/27/21     Multiple Vitamin (MULTIVITAMIN WITH MINERALS) TABS tablet Take 1 tablet by mouth in the morning. 11/29/22   Love, Evlyn Kanner, PA-C  omeprazole (PRILOSEC) 40 MG capsule TAKE ONE CAPSULE BY MOUTH EVERY MORNING Patient taking differently: Take 40 mg by mouth daily. 09/05/15   Erin Fulling, MD  omeprazole (PRILOSEC) 40 MG capsule Take 1 capsule (40 mg total) by mouth once daily 01/18/23     senna-docusate (SENEXON-S) 8.6-50 MG tablet Take 2 tablets by mouth daily with supper. 01/02/23   Lovorn, Aundra Millet, MD  terbinafine (LAMISIL) 250 MG tablet Take 1 tablet (250 mg total) by mouth daily. 07/31/22   Ignacia Bayley, PA-C    Family History  Problem Relation Age of Onset   Nephrolithiasis Mother    Cancer Father      Social History   Tobacco Use   Smoking status: Never   Smokeless tobacco: Never  Vaping Use   Vaping Use: Never used  Substance Use Topics   Alcohol use: Yes    Comment: occasional   Drug use: No    Allergies as of 02/06/2023   (No Known Allergies)    Review of Systems:    All systems reviewed and negative except where noted in HPI.   Observations/Objective:  Labs: CBC    Component Value Date/Time   WBC 6.8 11/26/2022 0614   RBC 4.51 11/26/2022 0614   HGB 14.4 11/26/2022 0614   HCT 42.4 11/26/2022 0614   PLT 306 11/26/2022 0614   MCV 94.0 11/26/2022 0614   MCH  31.9 11/26/2022 0614   MCHC 34.0 11/26/2022 0614   RDW 11.5 11/26/2022 0614   LYMPHSABS 1.8 11/26/2022 0614   MONOABS 0.7 11/26/2022 0614   EOSABS 0.2 11/26/2022 0614   BASOSABS 0.0 11/26/2022 0614   CMP     Component Value Date/Time   NA 137 11/26/2022 0614   K 3.9 11/26/2022 0614   CL 101 11/26/2022 0614   CO2 26 11/26/2022 0614   GLUCOSE 110 (H) 11/26/2022 0614   BUN 20 11/26/2022 0614   CREATININE 1.14 11/26/2022 0614   CALCIUM 9.0 11/26/2022 0614   PROT 5.7 (L) 11/16/2022 0840   ALBUMIN 2.9 (L) 11/16/2022 0840   AST 17 11/16/2022 0840   ALT 20 11/16/2022 0840  ALKPHOS 61 11/16/2022 0840   BILITOT 0.8 11/16/2022 0840   GFRNONAA >60 11/26/2022 0614   GFRAA >60 06/01/2015 0431    Imaging Studies: [place] LIMITED JOINT SPACE STRUCTURES UP LEFT(NO LINKED CHARGES)  Result Date: 01/25/2023 No images saved   VAS [place] ABI WITH/WO TBI  Result Date: 01/08/2023  LOWER EXTREMITY DOPPLER STUDY Patient Name:  Johnny Robinson  Date of Exam:   01/08/2023 Medical Rec #: [id number]   Accession #:    [id number] Date of Birth: [birth year]   Patient Gender: M Patient Age:   44 years Exam Location:  [facility] Procedure:      VAS [place] ABI WITH/WO TBI Referring Phys: Levora Dredge --------------------------------------------------------------------------------  Indications: Cold Extremities  Performing Technologist: Debbe Bales RVS  Examination Guidelines: A complete evaluation includes at minimum, Doppler waveform signals and systolic blood pressure reading at the level of bilateral brachial, anterior tibial, and posterior tibial arteries, when vessel segments are accessible. Bilateral testing is considered an integral part of a complete examination. Photoelectric Plethysmograph (PPG) waveforms and toe systolic pressure readings are included as required and additional duplex testing as needed. Limited examinations for reoccurring indications may be performed as noted.  ABI Findings:  +---------+------------------+-----+---------+--------+ Right    Rt Pressure (mmHg)IndexWaveform Comment  +---------+------------------+-----+---------+--------+ Brachial 127                                      +---------+------------------+-----+---------+--------+ ATA      134               1.05 triphasic         +---------+------------------+-----+---------+--------+ PTA      161               1.26 triphasic         +---------+------------------+-----+---------+--------+ Great Toe113               0.88 Normal            +---------+------------------+-----+---------+--------+ +---------+------------------+-----+---------+-------+ Left     Lt Pressure (mmHg)IndexWaveform Comment +---------+------------------+-----+---------+-------+ Brachial 128                                     +---------+------------------+-----+---------+-------+ ATA      150               1.17 triphasic        +---------+------------------+-----+---------+-------+ PTA      147               1.15 triphasic        +---------+------------------+-----+---------+-------+ Lynelle Smoke               1.09 Normal           +---------+------------------+-----+---------+-------+ +-------+-----------+-----------+------------+------------+ ABI/TBIToday's ABIToday's TBIPrevious ABIPrevious TBI +-------+-----------+-----------+------------+------------+ Right  1.26       .88                                 +-------+-----------+-----------+------------+------------+ Left   1.17       1.09                                +-------+-----------+-----------+------------+------------+  Summary: Right: Resting right ankle-brachial index is within normal range.  The right toe-brachial index is normal. Left: Resting left ankle-brachial index is within normal range. The left toe-brachial index is normal. *See table(s) above for measurements and observations.   Electronically signed by Levora Dredge MD on 01/08/2023 at 5:24:22 PM.    Final     Assessment and Plan:   Johnny Robinson is a 57 y.o. y/o male has been referred for GERD with a report of dysphagia after having cervical spine surgery.  The patient was having difficulty swallowing after that surgery.  He was told by the surgeon that it was likely due to the surgery and he also reports that his omeprazole is working to control his heartburn.  The patient had a colonoscopy with 5 polyps which were all hyperplastic requiring a repeat colonoscopy in 10 years which would be in 2029.  The patient has been told that since his symptoms are worse with solids and liquids at like him to take get a barium swallow to make sure that there is no strictures or narrowings that have developed.  If this is negative then the patient will continue to monitor his swallowing issues likely from a result of his surgery.  If the barium swallow shows a narrowing then he may need to be set up for an upper endoscopy with dilation.  The patient has been explained the plan and agrees with it.  Follow Up Instructions:  I discussed the assessment and treatment plan with the patient. The patient was provided an opportunity to ask questions and all were answered. The patient agreed with the plan and demonstrated an understanding of the instructions.   The patient was advised to call back or seek an in-person evaluation if the symptoms worsen or if the condition fails to improve as anticipated.  I provided 25 minutes of non-face-to-face time during this encounter including chart review In preparation for the encounter.   Midge Minium, MD  Speech recognition software was used to dictate the above note.

## 2023-02-06 ENCOUNTER — Telehealth: Payer: Commercial Managed Care - PPO | Admitting: Gastroenterology

## 2023-02-06 ENCOUNTER — Encounter: Payer: Self-pay | Admitting: Gastroenterology

## 2023-02-06 ENCOUNTER — Ambulatory Visit: Payer: Commercial Managed Care - PPO | Admitting: Occupational Therapy

## 2023-02-06 VITALS — Ht 73.0 in | Wt 183.0 lb

## 2023-02-06 DIAGNOSIS — R1319 Other dysphagia: Secondary | ICD-10-CM | POA: Diagnosis not present

## 2023-02-06 DIAGNOSIS — M6281 Muscle weakness (generalized): Secondary | ICD-10-CM

## 2023-02-06 DIAGNOSIS — M79642 Pain in left hand: Secondary | ICD-10-CM | POA: Diagnosis not present

## 2023-02-06 DIAGNOSIS — R208 Other disturbances of skin sensation: Secondary | ICD-10-CM

## 2023-02-06 DIAGNOSIS — M79602 Pain in left arm: Secondary | ICD-10-CM | POA: Diagnosis not present

## 2023-02-06 DIAGNOSIS — R278 Other lack of coordination: Secondary | ICD-10-CM | POA: Diagnosis not present

## 2023-02-06 DIAGNOSIS — R2681 Unsteadiness on feet: Secondary | ICD-10-CM | POA: Diagnosis not present

## 2023-02-06 DIAGNOSIS — R2689 Other abnormalities of gait and mobility: Secondary | ICD-10-CM | POA: Diagnosis not present

## 2023-02-06 NOTE — Addendum Note (Signed)
Addended by: Roena Malady on: 02/06/2023 08:38 AM   Modules accepted: Orders

## 2023-02-06 NOTE — Therapy (Signed)
OUTPATIENT OCCUPATIONAL THERAPY NEURO  Treatment Session  Patient Name: Johnny Robinson MRN: [id number] DOB:Jul 14, 1966, 57 y.o., male Today's Date: 02/06/2023  PCP: Marguarite Arbour, MD REFERRING PROVIDER: Jacquelynn Cree, PA-C    END OF SESSION:  OT End of Session - 02/06/23 1319     Visit Number 16    Number of Visits 22    Date for OT Re-Evaluation 03/14/23    Authorization Type [employer] employee - [employer] P[employer]    OT Start Time 1317    OT Stop Time 1400    OT Time Calculation (min) 43 min    Activity Tolerance Patient tolerated treatment well;No increased pain;Patient limited by fatigue    Behavior During Therapy [facility] for tasks assessed/performed                Past Medical History:  Diagnosis Date   Arthritis    Chronic kidney disease 2016   RENAL INFARCT   GERD (gastroesophageal reflux disease)    Hypertension    Reflux    Subdural hematoma (HCC) 1991   Past Surgical History:  Procedure Laterality Date   BACK SURGERY  11/2017   burr hole  1992   to evacuate SDH   COLONOSCOPY WITH PROPOFOL N/A 09/02/2018   Procedure: COLONOSCOPY WITH PROPOFOL;  Surgeon: Midge Minium, MD;  Location: [facility] ENDOSCOPY;  Service: Endoscopy;  Laterality: N/A;   EXCISION MASS ABDOMINAL Left 05/28/2018   Procedure: EXCISIONAL BIOPSY MASS ABDOMINAL WALL;  Surgeon: Carolan Shiver, MD;  Location: [facility] ORS;  Service: General;  Laterality: Left;   PERIPHERAL VASCULAR CATHETERIZATION Right 05/18/2015   Procedure: Renal Angiography;  Surgeon: Annice Needy, MD;  Location: [facility] INVASIVE CV LAB;  Service: Cardiovascular;  Laterality: Right;   PERIPHERAL VASCULAR CATHETERIZATION Bilateral 05/18/2015   Procedure: Renal Intervention;  Surgeon: Annice Needy, MD;  Location: [facility] INVASIVE CV LAB;  Service: Cardiovascular;  Laterality: Bilateral;   TOTAL HIP ARTHROPLASTY Left 03/07/2022   Procedure: TOTAL HIP ARTHROPLASTY ANTERIOR APPROACH;  Surgeon: Ollen Gross, MD;  Location: [facility] ORS;  Service:  Orthopedics;  Laterality: Left;   Patient Active Problem List   Diagnosis Date Noted   Nerve pain 12/31/2022   Left shoulder pain 11/30/2022   Urinary hesitancy 11/30/2022   Constipation 11/30/2022   Biceps tendinitis of left shoulder 11/30/2022   Multifocal pneumonia 11/30/2022   Chronic pain syndrome 11/21/2022   HNP (herniated nucleus pulposus) with myelopathy, cervical 11/15/2022   Cervical myelopathy (HCC) 11/13/2022   OA (osteoarthritis) of hip 03/07/2022   Primary osteoarthritis of left hip 03/07/2022   GERD (gastroesophageal reflux disease) 11/27/2021   Encounter for screening colonoscopy    Benign neoplasm of rectosigmoid junction    Hyperlipidemia 12/18/2017   Essential hypertension 12/17/2016   Chest pain 05/30/2015   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    ONSET DATE: 11/13/22  REFERRING DIAG: G95.9 (ICD-10-CM) - Cervical myelopathy  THERAPY DIAG:  Other lack of coordination  Muscle weakness (generalized)  Pain in left arm  Other disturbances of skin sensation  Rationale for Evaluation and Treatment: Rehabilitation  PERTINENT HISTORY: 57 year old R handed male with 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 Dr. Jordan Likes expressed concerns  of cord contusion due to worsening of myelopathy and MRI spine done which showed post op changes form ACDF C3-C6 and patchy signal abnormality involving the central and left cord at C5/6 concerning for contusion/injury.  PRECAUTIONS: Cervical and Other: lifting precautions not > 5#  WEIGHT BEARING RESTRICTIONS: No  SUBJECTIVE:   SUBJECTIVE STATEMENT: Pt reports doing some gardening work  over the weekend.  Pt accompanied by: self   PAIN:  Are you having pain?  Not at rest today  FALLS: Has patient fallen in last 6 months? No  LIVING ENVIRONMENT: Lives with: lives with their spouse and 67 yo Lives in: House/apartment Stairs: Yes: Internal: full flight steps; on right going up and External: 4 steps; none Has following equipment at home: Quad cane small base, Walker - 2 wheeled, shower chair, bed side commode, and hand held shower head  PLOF: Independent, Independent with basic ADLs, and Vocation/Vocational requirements: practices law  PATIENT GOALS: pt would like to get arm progressing like leg    OBJECTIVE: (All objective assessments below are from initial evaluation on: 12/06/22 unless otherwise specified.)   HAND DOMINANCE: Right  ADLs: Overall ADLs: requires increased time and thought to complete Transfers/ambulation related to ADLs: Mod I with SBQC Eating: requires assistance to cut food Grooming: Mod I UB Dressing: requiring assistance with buttons LB Dressing: requiring assistance with socks/tying shoes Toileting: Mod I Bathing: Mod I Tub Shower transfers: Mod I, has built in shower seat Equipment: Walk in shower  IADLs: Light housekeeping: has attempted vacuuming without issue Meal Prep: has attempted cooking, spouse or children assisting with retrieving items if >5# Medication management: spouse is filling pillbox and is reminding him to take  MOBILITY STATUS: Needs Assist: Mod I - Supervision with SBQC  POSTURE COMMENTS:  rounded shoulders and forward head  FUNCTIONAL OUTCOME MEASURES: Upper Extremity Functional Scale (UEFS): 33/80 =  41.25% function  UPPER EXTREMITY ROM:    Active ROM Left eval Left 01/16/23  Shoulder flexion 88 (requiring abduction to achieve) 110  Shoulder abduction 85 90  Shoulder adduction    Shoulder extension    Shoulder internal rotation 90% WNL  Shoulder external rotation 30% 30%  Elbow flexion 100 WNL   Elbow extension WNL WNL  Wrist flexion WNL WNL  Wrist extension To neutral 10*  Wrist ulnar deviation trace 20*  Wrist radial deviation trace 18*  Wrist pronation    Wrist supination To neutral To neutral (without bracing, does have full range when bracing at elbow)  (Blank rows = not tested)  UPPER EXTREMITY MMT:     MMT Left eval Left 01/16/23  Shoulder flexion 2-/5 3-/5  Shoulder abduction    Shoulder adduction    Shoulder extension    Shoulder internal rotation    Shoulder external rotation    Middle trapezius    Lower trapezius    Elbow flexion 4-/5   Elbow extension 4-/5   Wrist flexion 3-/5 3/5  Wrist extension 3-/5 3/5  Wrist ulnar deviation    Wrist radial deviation    Wrist pronation    Wrist supination    (Blank rows = not tested)  HAND FUNCTION: Loose gross grasp, unable to assess strength on dynamometer  COORDINATION: Finger Nose Finger test: able to reach chin to finger with LUE, decreased shoulder ROM limiting movement 9 Hole Peg test: Right: 23.03 sec; Left: able to place 5 pegs in 2 min time limit (pt needing to use side of container/multiple pegs to leverage to pick up one  peg, also with difficulty with manipulation/translation in finger tips to orient to place in peg hole) Box and Blocks:  Right 44 blocks, Left 32 blocks  SENSATION: Reports duller sensation on L compared to R  COGNITION: Overall cognitive status: Within functional limits for tasks assessed  VISION: Subjective report: no changes Baseline vision: Wears glasses for reading only   TODAY'S TREATMENT:                                                                                       02/06/23 Supine therex: Engaged in shoulder internal/external rotation with pt demonstrating ability to complete 50% of external rotation in supine. Transitioned to supine chest press with lightweight dowel with BUE to facilitate movement.  Pt attempted with LUE only, however unable to elicit movement  without assistance of dowel.  Attempted with less stable dowel, with pt requiring sturdiness of dowel to facilitate movement.  Pt was able to achieve modified chest press without dowel, however requiring compensation of shoulder abduction to 90* and then able to push up.  Horizontal abduction: attempted horizontal abduction with pt able to complete ~45* but unable to achieve greater ROM in sidelying.  Pt requested to attempt in supine with pt able to complete horizontal abduction/adduction to midline and out to table top with good motor control.  Discussed stability of scapulae when in supine vs sidelying.  Pt transitioned to completing horizontal abduction in sitting with 2# weights, demonstrating min scapular winging but ability to complete with some onset of fatigue with repetition.  Exercises - Supine Shoulder External and Internal Rotation in Abduction  - 2 x daily - 2 sets - 10 reps - Supine Shoulder Press AAROM in Abduction with Dowel  - 2 x daily - 2 sets - 10 reps - Sidelying Shoulder Horizontal Abduction  - 2 x daily - 3 sets - 10 reps    02/04/23 Coordination: placing pegs of various sizes into peg board challenging coordination by completing rotation before placing into peg holes.  Pt demonstrating increased difficulty with smaller pegs but able to complete with increased time.  Therapist modified positioning of peg board to further facilitate increased shoulder flexion and abduction with ability to maintain UE in that position.  Engaged in picking up coins with focus on forearm supination to place coins into coin slot.  Removed pegs from peg board and placing into cup at high range to facilitate increased shoulder flexion and functional reach. Engaged in in-hand manipulation and translation from finger tips to palm with larger pegs to facilitate increased control and closure of hand. 9 hole peg test: 54.38 sec     01/31/23 UE ROM: engaged in exercises to target external rotation and  strengthening of teres minor and infraspinatus groups.  Pt continues to demonstrate decreased external rotation in all planes, complicating complete participation in ulnar nerve flossing.  Pt able to complete tasks with use of door frame, wall, and or pushing up from chair and in gravity eliminated and/or with hand over hand assist.   - Ulnar nerve flossing - Standing  Push Up with Scapular Retraction at [facility]   - Standing Shoulder External Rotation Stretch in  Doorway   - Shoulder External Rotation and Scapular Retraction   - Seated Chair Push Ups  Coordination: w/ LUE including: rotation of 2 golf balls in hand (clockwise and counter clockwise), in-hand manipulation with discussion of use of slightly larger items to increase control (stones, checkers, poker chips) completed with stones during session, and reiteration of "pen tricks" to continues to address in-hand manipulation throughout the day.   PATIENT EDUCATION: Educion details: ongoing condition specific education and UE ROM and strengatthening exercises Person educated: Patient Education method: Explanation, Demonstration, Verbal cues, and Handouts Education comprehension: verbalized understanding and needs further education  HOME EXERCISE PROGRAM: Access Code: [email or web address] URL: [email or web address] Date: 01/31/2023 Prepared by: [facility] - Outpatient  Rehab - [facility]  01/23/23: Access Code: [email or web address] URL: [email or web address] Date: 01/23/2023 Prepared by: Fannie Knee  01/16/23: Access Code: [email or web address] URL: [email or web address] Date: 01/16/2023 Prepared by: [facility] - Outpatient  Rehab - [facility]  01/09/23: Access Code: [email or web address] URL: [email or web address] Date: 01/09/2023 Prepared by: [facility] - Outpatient  Rehab - [facility]   GOALS: Goals reviewed with patient? Yes  SHORT TERM GOALS: Target date: 01/04/23  Pt will be independent with  coordination and ROM HEP. Baseline: Goal status: MET - 12/31/22  2.  Pt will demonstrate increased coordination to be able to place all 9 pegs into peg board during 9 hole peg test in 2 min time limit. Baseline: able to place 5 in 2 min time limit Goal status: MET - 1:06.84 on 12/31/22  3.  Pt will demonstrate increased L shoulder ROM to allow for ability to retreive light weight object from shoulder height cabinet. Baseline: 88* shoulder flexion with abduction Goal status: IN PROGRESS  4.  Pt will demonstrate improved pinch and coordination as needed to complete clothing fasteners (buttons, tying shoes, etc). Baseline:  Goal status: MET - 01/02/23   LONG TERM GOALS:  Target date: 03/14/23  Pt will be independent with advanced HEP. Baseline:  Goal status: IN PROGRESS  2.  Pt will demonstrate improved fine motor coordination for ADLs as evidenced by being able to complete 9 hole peg test in 45 seconds or less Baseline: 1:06.84 on 12/31/22 Goal status: IN PROGRESS  3.  Pt will demonstrate increased L shoulder ROM and strength to tolerate holding phone to ear for 5 mins phone conversation. Baseline:  Goal status: IN PROGRESS  4.  Pt will report improved LUE functional use as evidenced by increased score on UEFS to > 70%. Baseline: 57.5% on 01/21/23 Goal status: IN PROGRESS  5. Pt will report improved shoulder ROM and strength to be able to tie a neck tie.  Baseline:  Goal status: IN PROGRESS  6.  Pt will demonstrate improved sustained grasp in L hand to carry a coffee cup 50' without spilling.  Baseline:  Goal status: IN PROGRESS  7.  Pt will demonstrate and/or report improved coordination to demonstrate improved ease/speed of typing by increased WPM >/= to 10 WPM  Baseline: 22  Goal status: IN PROGRESS    ASSESSMENT:  CLINICAL IMPRESSION: Reviewed anatomy of C5/C6 and difficulties with shoulder flexion vs incorporating abduction to achieve further reach of LUE.  Pt asking  questions about likelihood of functional recovery with OT answering within scope of practice and encouraging further questions to MD and recommending continued focus on stretching and strengthening in various planes to continue to facilitate motor recovery.  Pt frequently utilizing bracing or shoulder abduction to achieve functional use and ROM.   PLAN:  OT  FREQUENCY: 2x/week  OT DURATION: 6 weeks (plan to only be seen for 4 weeks but asking for 6 due to scheduling conflicts)  PLANNED INTERVENTIONS: self care/ADL training, therapeutic exercise, therapeutic activity, neuromuscular re-education, manual therapy, passive range of motion, balance training, functional mobility training, electrical stimulation, ultrasound, moist heat, cryotherapy, patient/family education, psychosocial skills training, energy conservation, coping strategies training, and DME and/or AE instructions  RECOMMENDED OTHER SERVICES: NA  CONSULTED AND AGREED WITH PLAN OF CARE: Patient  PLAN FOR NEXT SESSION:  Keep emphasizing stretching into internal and external rotation of the shoulder as well as strengthening the external rotators (teres minor and infraspinatus groups).  Review fine motor skills as needed keep on ulnar nerve gliding.  Need to consolidate HEP into one. Nerve glides  Jamir Rone, OTR/L 02/06/2023, 1:20 PM

## 2023-02-12 ENCOUNTER — Ambulatory Visit: Payer: Commercial Managed Care - PPO | Admitting: Occupational Therapy

## 2023-02-12 DIAGNOSIS — M79602 Pain in left arm: Secondary | ICD-10-CM

## 2023-02-12 DIAGNOSIS — R208 Other disturbances of skin sensation: Secondary | ICD-10-CM | POA: Diagnosis not present

## 2023-02-12 DIAGNOSIS — M6281 Muscle weakness (generalized): Secondary | ICD-10-CM

## 2023-02-12 DIAGNOSIS — R278 Other lack of coordination: Secondary | ICD-10-CM | POA: Diagnosis not present

## 2023-02-12 DIAGNOSIS — R2689 Other abnormalities of gait and mobility: Secondary | ICD-10-CM | POA: Diagnosis not present

## 2023-02-12 DIAGNOSIS — R2681 Unsteadiness on feet: Secondary | ICD-10-CM | POA: Diagnosis not present

## 2023-02-12 DIAGNOSIS — M79642 Pain in left hand: Secondary | ICD-10-CM | POA: Diagnosis not present

## 2023-02-12 NOTE — Therapy (Signed)
OUTPATIENT OCCUPATIONAL THERAPY NEURO  Treatment Session  Patient Name: Johnny Robinson MRN: [id number] DOB:Aug 01, 1966, 57 y.o., male Today's Date: 02/12/2023  PCP: Marguarite Arbour, MD REFERRING PROVIDER: Jacquelynn Cree, PA-C    END OF SESSION:  OT End of Session - 02/12/23 0853     Visit Number 17    Number of Visits 22    Date for OT Re-Evaluation 03/14/23    Authorization Type [employer] employee - [employer] P[employer]    OT Start Time [phone]    OT Stop Time 0930    OT Time Calculation (min) 42 min    Activity Tolerance Patient tolerated treatment well;No increased pain;Patient limited by fatigue    Behavior During Therapy [facility] for tasks assessed/performed                 Past Medical History:  Diagnosis Date   Arthritis    Chronic kidney disease 2016   RENAL INFARCT   GERD (gastroesophageal reflux disease)    Hypertension    Reflux    Subdural hematoma (HCC) 1991   Past Surgical History:  Procedure Laterality Date   BACK SURGERY  11/2017   burr hole  1992   to evacuate SDH   COLONOSCOPY WITH PROPOFOL N/A 09/02/2018   Procedure: COLONOSCOPY WITH PROPOFOL;  Surgeon: Midge Minium, MD;  Location: [facility] ENDOSCOPY;  Service: Endoscopy;  Laterality: N/A;   EXCISION MASS ABDOMINAL Left 05/28/2018   Procedure: EXCISIONAL BIOPSY MASS ABDOMINAL WALL;  Surgeon: Carolan Shiver, MD;  Location: [facility] ORS;  Service: General;  Laterality: Left;   PERIPHERAL VASCULAR CATHETERIZATION Right 05/18/2015   Procedure: Renal Angiography;  Surgeon: Annice Needy, MD;  Location: [facility] INVASIVE CV LAB;  Service: Cardiovascular;  Laterality: Right;   PERIPHERAL VASCULAR CATHETERIZATION Bilateral 05/18/2015   Procedure: Renal Intervention;  Surgeon: Annice Needy, MD;  Location: [facility] INVASIVE CV LAB;  Service: Cardiovascular;  Laterality: Bilateral;   TOTAL HIP ARTHROPLASTY Left 03/07/2022   Procedure: TOTAL HIP ARTHROPLASTY ANTERIOR APPROACH;  Surgeon: Ollen Gross, MD;  Location: [facility] ORS;   Service: Orthopedics;  Laterality: Left;   Patient Active Problem List   Diagnosis Date Noted   Nerve pain 12/31/2022   Left shoulder pain 11/30/2022   Urinary hesitancy 11/30/2022   Constipation 11/30/2022   Biceps tendinitis of left shoulder 11/30/2022   Multifocal pneumonia 11/30/2022   Chronic pain syndrome 11/21/2022   HNP (herniated nucleus pulposus) with myelopathy, cervical 11/15/2022   Cervical myelopathy (HCC) 11/13/2022   OA (osteoarthritis) of hip 03/07/2022   Primary osteoarthritis of left hip 03/07/2022   GERD (gastroesophageal reflux disease) 11/27/2021   Encounter for screening colonoscopy    Benign neoplasm of rectosigmoid junction    Hyperlipidemia 12/18/2017   Essential hypertension 12/17/2016   Chest pain 05/30/2015   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    ONSET DATE: 11/13/22  REFERRING DIAG: G95.9 (ICD-10-CM) - Cervical myelopathy  THERAPY DIAG:  Other lack of coordination  Muscle weakness (generalized)  Pain in left arm  Other disturbances of skin sensation  Rationale for Evaluation and Treatment: Rehabilitation  PERTINENT HISTORY: 57 year old R handed male with 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 Dr. Jordan Likes expressed  concerns of cord contusion due to worsening of myelopathy and MRI spine done which showed post op changes form ACDF C3-C6 and patchy signal abnormality involving the central and left cord at C5/6 concerning for contusion/injury.  PRECAUTIONS: Cervical and Other: lifting precautions not > 5#  WEIGHT BEARING RESTRICTIONS: No  SUBJECTIVE:   SUBJECTIVE STATEMENT: Pt reports doing some white  water rafting on Friday, requiring a lot of pushing/pulling with L hand and hiked [place] over the weekend.   Pt accompanied by: self   PAIN:  Are you having pain?  Not at rest today  FALLS: Has patient fallen in last 6 months? No  LIVING ENVIRONMENT: Lives with: lives with their spouse and 53 yo Lives in: House/apartment Stairs: Yes: Internal: full flight steps; on right going up and External: 4 steps; none Has following equipment at home: Quad cane small base, Walker - 2 wheeled, shower chair, bed side commode, and hand held shower head  PLOF: Independent, Independent with basic ADLs, and Vocation/Vocational requirements: practices law  PATIENT GOALS: pt would like to get arm progressing like leg    OBJECTIVE: (All objective assessments below are from initial evaluation on: 12/06/22 unless otherwise specified.)   HAND DOMINANCE: Right  ADLs: Overall ADLs: requires increased time and thought to complete Transfers/ambulation related to ADLs: Mod I with SBQC Eating: requires assistance to cut food Grooming: Mod I UB Dressing: requiring assistance with buttons LB Dressing: requiring assistance with socks/tying shoes Toileting: Mod I Bathing: Mod I Tub Shower transfers: Mod I, has built in shower seat Equipment: Walk in shower  IADLs: Light housekeeping: has attempted vacuuming without issue Meal Prep: has attempted cooking, spouse or children assisting with retrieving items if >5# Medication management: spouse is filling pillbox and is reminding him to take  MOBILITY STATUS: Needs Assist: Mod I - Supervision with SBQC  POSTURE COMMENTS:  rounded shoulders and forward head  FUNCTIONAL OUTCOME MEASURES: Upper Extremity Functional Scale (UEFS): 33/80 =  41.25% function  UPPER EXTREMITY ROM:    Active ROM Left eval Left 01/16/23  Shoulder flexion 88 (requiring abduction to achieve) 110  Shoulder abduction 85 90  Shoulder adduction    Shoulder extension    Shoulder  internal rotation 90% WNL  Shoulder external rotation 30% 30%  Elbow flexion 100 WNL  Elbow extension WNL WNL  Wrist flexion WNL WNL  Wrist extension To neutral 10*  Wrist ulnar deviation trace 20*  Wrist radial deviation trace 18*  Wrist pronation    Wrist supination To neutral To neutral (without bracing, does have full range when bracing at elbow)  (Blank rows = not tested)  UPPER EXTREMITY MMT:     MMT Left eval Left 01/16/23  Shoulder flexion 2-/5 3-/5  Shoulder abduction    Shoulder adduction    Shoulder extension    Shoulder internal rotation    Shoulder external rotation    Middle trapezius    Lower trapezius    Elbow flexion 4-/5   Elbow extension 4-/5   Wrist flexion 3-/5 3/5  Wrist extension 3-/5 3/5  Wrist ulnar deviation    Wrist radial deviation    Wrist pronation    Wrist supination    (Blank rows = not tested)  HAND FUNCTION: Loose gross grasp, unable to assess strength on dynamometer  COORDINATION: Finger Nose Finger test: able to reach chin to finger with LUE, decreased shoulder ROM limiting movement 9 Hole Peg test: Right: 23.03 sec; Left: able to place 5 pegs in 2  min time limit (pt needing to use side of container/multiple pegs to leverage to pick up one peg, also with difficulty with manipulation/translation in finger tips to orient to place in peg hole) Box and Blocks:  Right 44 blocks, Left 32 blocks  SENSATION: Reports duller sensation on L compared to R  COGNITION: Overall cognitive status: Within functional limits for tasks assessed  VISION: Subjective report: no changes Baseline vision: Wears glasses for reading only   TODAY'S TREATMENT:                                                                                       02/12/23 Nerve glides: attempted ulnar nerve glides and median glides with and without support of elevated table to allow for increased support.  Pt still with difficulty with maintaining LUE against gravity > 90*  without support from table top or other hand.  Pt benefited from use of BUE during ulnar nerve flossing with hands in prayer stretch position, incorporating movements in horizontal and vertical pattern.  Pt completed median nerve flossing with ability to complete 80-90% of task demonstrating deceased internal rotation.   Grip strength: 76# on R, L: 8# in neutral and 11# with wrist and elbow flexion.  Engaged in grip and finger extension with use of hand master.  Reiterated use of rubber band at home/work to facilitate similar movement/strengthening for finger extension.   Coordination: engaged in small peg board pattern replication with pt demonstrating increased shoulder elevation and abduction to increase success, compensating for forearm supination/pronation and wrist movement.      02/06/23 Supine therex: Engaged in shoulder internal/external rotation with pt demonstrating ability to complete 50% of external rotation in supine. Transitioned to supine chest press with lightweight dowel with BUE to facilitate movement.  Pt attempted with LUE only, however unable to elicit movement without assistance of dowel.  Attempted with less stable dowel, with pt requiring sturdiness of dowel to facilitate movement.  Pt was able to achieve modified chest press without dowel, however requiring compensation of shoulder abduction to 90* and then able to push up.  Horizontal abduction: attempted horizontal abduction with pt able to complete ~45* but unable to achieve greater ROM in sidelying.  Pt requested to attempt in supine with pt able to complete horizontal abduction/adduction to midline and out to table top with good motor control.  Discussed stability of scapulae when in supine vs sidelying.  Pt transitioned to completing horizontal abduction in sitting with 2# weights, demonstrating min scapular winging but ability to complete with some onset of fatigue with repetition.  Exercises - Supine Shoulder External and  Internal Rotation in Abduction  - 2 x daily - 2 sets - 10 reps - Supine Shoulder Press AAROM in Abduction with Dowel  - 2 x daily - 2 sets - 10 reps - Sidelying Shoulder Horizontal Abduction  - 2 x daily - 3 sets - 10 reps    02/04/23 Coordination: placing pegs of various sizes into peg board challenging coordination by completing rotation before placing into peg holes.  Pt demonstrating increased difficulty with smaller pegs but able to complete with increased time.  Therapist modified positioning  of peg board to further facilitate increased shoulder flexion and abduction with ability to maintain UE in that position.  Engaged in picking up coins with focus on forearm supination to place coins into coin slot.  Removed pegs from peg board and placing into cup at high range to facilitate increased shoulder flexion and functional reach. Engaged in in-hand manipulation and translation from finger tips to palm with larger pegs to facilitate increased control and closure of hand. 9 hole peg test: 54.38 sec     PATIENT EDUCATION: Educion details: ongoing condition specific education and UE ROM and strengatthening exercises Person educated: Patient Education method: Explanation, Demonstration, Verbal cues, and Handouts Education comprehension: verbalized understanding and needs further education  HOME EXERCISE PROGRAM: Access Code: [email or web address] URL: [email or web address] Date: 01/31/2023 Prepared by: [facility] - Outpatient  Rehab - [facility]  01/23/23: Access Code: [email or web address] URL: [email or web address] Date: 01/23/2023 Prepared by: Fannie Knee  01/16/23: Access Code: [email or web address] URL: [email or web address] Date: 01/16/2023 Prepared by: [facility] - Outpatient  Rehab - [facility]  01/09/23: Access Code: [email or web address] URL: [email or web address] Date: 01/09/2023 Prepared by: [facility] - Outpatient  Rehab - [facility]   GOALS: Goals reviewed with patient? Yes  SHORT TERM GOALS: Target date: 01/04/23  Pt will be independent with coordination and ROM HEP. Baseline: Goal status: MET - 12/31/22  2.  Pt will demonstrate increased coordination to be able to place all 9 pegs into peg board during 9 hole peg test in 2 min time limit. Baseline: able to place 5 in 2 min time limit Goal status: MET - 1:06.84 on 12/31/22  3.  Pt will demonstrate increased L shoulder ROM to allow for ability to retreive light weight object from shoulder height cabinet. Baseline: 88* shoulder flexion with abduction Goal status: IN PROGRESS  4.  Pt will demonstrate improved pinch and coordination as needed to complete clothing fasteners (buttons, tying shoes, etc). Baseline:  Goal status: MET - 01/02/23   LONG TERM GOALS:  Target date: 03/14/23  Pt will be independent with advanced HEP. Baseline:  Goal status: IN PROGRESS  2.  Pt will demonstrate improved fine motor coordination for ADLs as evidenced by being able to complete 9 hole peg test in 45 seconds or less Baseline: 1:06.84 on 12/31/22 Goal status: IN PROGRESS  3.  Pt will demonstrate increased L shoulder ROM and strength to tolerate holding phone to ear for 5 mins phone conversation. Baseline:  Goal status: IN PROGRESS  4.  Pt will report improved LUE functional use as evidenced by increased score on UEFS to > 70%. Baseline: 57.5% on 01/21/23 Goal status: IN PROGRESS  5. Pt will report improved shoulder ROM and strength to be able to tie a neck tie.  Baseline:  Goal status: IN PROGRESS  6.  Pt will demonstrate improved sustained grasp in L hand to carry a coffee cup 50' without spilling.  Baseline:  Goal status: IN PROGRESS  7.  Pt will demonstrate and/or report improved coordination to demonstrate improved ease/speed of typing by increased WPM >/= to 10 WPM  Baseline: 22  Goal status: IN PROGRESS    ASSESSMENT:  CLINICAL IMPRESSION: Pt continues  to demonstrate decreased shoulder flexion > 90* and recruiting abduction and support to facilitate increased ROM and functional use.  Pt demonstrating improvements in [facility] tasks with use of compensatory shoulder positioning.  Plan to further address grip strengthening as appropriate.   PLAN:  OT FREQUENCY: 2x/week  OT DURATION: 6 weeks (plan to only  be seen for 4 weeks but asking for 6 due to scheduling conflicts)  PLANNED INTERVENTIONS: self care/ADL training, therapeutic exercise, therapeutic activity, neuromuscular re-education, manual therapy, passive range of motion, balance training, functional mobility training, electrical stimulation, ultrasound, moist heat, cryotherapy, patient/family education, psychosocial skills training, energy conservation, coping strategies training, and DME and/or AE instructions  RECOMMENDED OTHER SERVICES: NA  CONSULTED AND AGREED WITH PLAN OF CARE: Patient  PLAN FOR NEXT SESSION:  Keep emphasizing stretching into internal and external rotation of the shoulder as well as strengthening the external rotators (teres minor and infraspinatus groups).  Review fine motor skills as needed keep on ulnar nerve gliding.  Need to consolidate HEP into one. Address grip strengthening as appropriate.   Rosalio Loud, OTR/L 02/12/2023, 8:53 AM

## 2023-02-13 ENCOUNTER — Ambulatory Visit
Admission: RE | Admit: 2023-02-13 | Discharge: 2023-02-13 | Disposition: A | Discharge status: Home / Self Care | Payer: Commercial Managed Care - PPO | Source: Ambulatory Visit | Attending: Gastroenterology | Admitting: Gastroenterology

## 2023-02-13 DIAGNOSIS — R1319 Other dysphagia: Secondary | ICD-10-CM | POA: Insufficient documentation

## 2023-02-13 DIAGNOSIS — K449 Diaphragmatic hernia without obstruction or gangrene: Secondary | ICD-10-CM | POA: Diagnosis not present

## 2023-02-13 DIAGNOSIS — R1310 Dysphagia, unspecified: Secondary | ICD-10-CM | POA: Diagnosis not present

## 2023-02-19 ENCOUNTER — Ambulatory Visit: Payer: Commercial Managed Care - PPO | Attending: Physical Medicine and Rehabilitation | Admitting: Occupational Therapy

## 2023-02-19 DIAGNOSIS — R278 Other lack of coordination: Secondary | ICD-10-CM | POA: Diagnosis not present

## 2023-02-19 DIAGNOSIS — R208 Other disturbances of skin sensation: Secondary | ICD-10-CM | POA: Diagnosis not present

## 2023-02-19 DIAGNOSIS — M6281 Muscle weakness (generalized): Secondary | ICD-10-CM | POA: Diagnosis not present

## 2023-02-19 DIAGNOSIS — M79602 Pain in left arm: Secondary | ICD-10-CM | POA: Insufficient documentation

## 2023-02-19 DIAGNOSIS — M79642 Pain in left hand: Secondary | ICD-10-CM | POA: Insufficient documentation

## 2023-02-19 NOTE — Therapy (Signed)
OUTPATIENT OCCUPATIONAL THERAPY NEURO  Treatment Session  Patient Name: Johnny Robinson MRN: [id number] DOB:1966-09-17, 57 y.o., male Today's Date: 02/19/2023  PCP: Marguarite Arbour, MD REFERRING PROVIDER: Jacquelynn Cree, PA-C    END OF SESSION:  OT End of Session - 02/19/23 1215     Visit Number 18    Number of Visits 22    Date for OT Re-Evaluation 03/14/23    Authorization Type [employer] employee - [employer] P[employer]    OT Start Time 1104    OT Stop Time 1148    OT Time Calculation (min) 44 min    Activity Tolerance Patient tolerated treatment well;No increased pain;Patient limited by fatigue    Behavior During Therapy [facility] for tasks assessed/performed                  Past Medical History:  Diagnosis Date   Arthritis    Chronic kidney disease 2016   RENAL INFARCT   GERD (gastroesophageal reflux disease)    Hypertension    Reflux    Subdural hematoma (HCC) 1991   Past Surgical History:  Procedure Laterality Date   BACK SURGERY  11/2017   burr hole  1992   to evacuate SDH   COLONOSCOPY WITH PROPOFOL N/A 09/02/2018   Procedure: COLONOSCOPY WITH PROPOFOL;  Surgeon: Midge Minium, MD;  Location: [facility] ENDOSCOPY;  Service: Endoscopy;  Laterality: N/A;   EXCISION MASS ABDOMINAL Left 05/28/2018   Procedure: EXCISIONAL BIOPSY MASS ABDOMINAL WALL;  Surgeon: Carolan Shiver, MD;  Location: [facility] ORS;  Service: General;  Laterality: Left;   PERIPHERAL VASCULAR CATHETERIZATION Right 05/18/2015   Procedure: Renal Angiography;  Surgeon: Annice Needy, MD;  Location: [facility] INVASIVE CV LAB;  Service: Cardiovascular;  Laterality: Right;   PERIPHERAL VASCULAR CATHETERIZATION Bilateral 05/18/2015   Procedure: Renal Intervention;  Surgeon: Annice Needy, MD;  Location: [facility] INVASIVE CV LAB;  Service: Cardiovascular;  Laterality: Bilateral;   TOTAL HIP ARTHROPLASTY Left 03/07/2022   Procedure: TOTAL HIP ARTHROPLASTY ANTERIOR APPROACH;  Surgeon: Ollen Gross, MD;  Location: [facility] ORS;   Service: Orthopedics;  Laterality: Left;   Patient Active Problem List   Diagnosis Date Noted   Nerve pain 12/31/2022   Left shoulder pain 11/30/2022   Urinary hesitancy 11/30/2022   Constipation 11/30/2022   Biceps tendinitis of left shoulder 11/30/2022   Multifocal pneumonia 11/30/2022   Chronic pain syndrome 11/21/2022   HNP (herniated nucleus pulposus) with myelopathy, cervical 11/15/2022   Cervical myelopathy (HCC) 11/13/2022   OA (osteoarthritis) of hip 03/07/2022   Primary osteoarthritis of left hip 03/07/2022   GERD (gastroesophageal reflux disease) 11/27/2021   Encounter for screening colonoscopy    Benign neoplasm of rectosigmoid junction    Hyperlipidemia 12/18/2017   Essential hypertension 12/17/2016   Chest pain 05/30/2015   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    ONSET DATE: 11/13/22  REFERRING DIAG: G95.9 (ICD-10-CM) - Cervical myelopathy  THERAPY DIAG:  Muscle weakness (generalized)  Other lack of coordination  Pain in left arm  Other disturbances of skin sensation  Rationale for Evaluation and Treatment: Rehabilitation  PERTINENT HISTORY: 57 year old R handed male with 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 Dr. Jordan Likes  expressed concerns of cord contusion due to worsening of myelopathy and MRI spine done which showed post op changes form ACDF C3-C6 and patchy signal abnormality involving the central and left cord at C5/6 concerning for contusion/injury.  PRECAUTIONS: Cervical and Other: lifting precautions not > 5#  WEIGHT BEARING RESTRICTIONS: No  SUBJECTIVE:   SUBJECTIVE STATEMENT: Pt reports doing some white  water rafting on Friday, requiring a lot of pushing/pulling with L hand and hiked [place] over the weekend.   Pt accompanied by: self   PAIN:  Are you having pain?  Not at rest today  FALLS: Has patient fallen in last 6 months? No  LIVING ENVIRONMENT: Lives with: lives with their spouse and 25 yo Lives in: House/apartment Stairs: Yes: Internal: full flight steps; on right going up and External: 4 steps; none Has following equipment at home: Quad cane small base, Walker - 2 wheeled, shower chair, bed side commode, and hand held shower head  PLOF: Independent, Independent with basic ADLs, and Vocation/Vocational requirements: practices law  PATIENT GOALS: pt would like to get arm progressing like leg    OBJECTIVE: (All objective assessments below are from initial evaluation on: 12/06/22 unless otherwise specified.)   HAND DOMINANCE: Right  ADLs: Overall ADLs: requires increased time and thought to complete Transfers/ambulation related to ADLs: Mod I with SBQC Eating: requires assistance to cut food Grooming: Mod I UB Dressing: requiring assistance with buttons LB Dressing: requiring assistance with socks/tying shoes Toileting: Mod I Bathing: Mod I Tub Shower transfers: Mod I, has built in shower seat Equipment: Walk in shower  IADLs: Light housekeeping: has attempted vacuuming without issue Meal Prep: has attempted cooking, spouse or children assisting with retrieving items if >5# Medication management: spouse is filling pillbox and is reminding him to take  MOBILITY STATUS: Needs Assist: Mod I - Supervision with SBQC  POSTURE COMMENTS:  rounded shoulders and forward head  FUNCTIONAL OUTCOME MEASURES: Upper Extremity Functional Scale (UEFS): 33/80 =  41.25% function  UPPER EXTREMITY ROM:    Active ROM Left eval Left 01/16/23  Shoulder flexion 88 (requiring abduction to achieve) 110  Shoulder abduction 85 90  Shoulder adduction    Shoulder extension    Shoulder  internal rotation 90% WNL  Shoulder external rotation 30% 30%  Elbow flexion 100 WNL  Elbow extension WNL WNL  Wrist flexion WNL WNL  Wrist extension To neutral 10*  Wrist ulnar deviation trace 20*  Wrist radial deviation trace 18*  Wrist pronation    Wrist supination To neutral To neutral (without bracing, does have full range when bracing at elbow)  (Blank rows = not tested)  UPPER EXTREMITY MMT:     MMT Left eval Left 01/16/23  Shoulder flexion 2-/5 3-/5  Shoulder abduction    Shoulder adduction    Shoulder extension    Shoulder internal rotation    Shoulder external rotation    Middle trapezius    Lower trapezius    Elbow flexion 4-/5   Elbow extension 4-/5   Wrist flexion 3-/5 3/5  Wrist extension 3-/5 3/5  Wrist ulnar deviation    Wrist radial deviation    Wrist pronation    Wrist supination    (Blank rows = not tested)  HAND FUNCTION: Loose gross grasp, unable to assess strength on dynamometer  COORDINATION: Finger Nose Finger test: able to reach chin to finger with LUE, decreased shoulder ROM limiting movement 9 Hole Peg test: Right: 23.03 sec; Left: able to place 5 pegs in  2 min time limit (pt needing to use side of container/multiple pegs to leverage to pick up one peg, also with difficulty with manipulation/translation in finger tips to orient to place in peg hole) Box and Blocks:  Right 44 blocks, Left 32 blocks  SENSATION: Reports duller sensation on L compared to R  COGNITION: Overall cognitive status: Within functional limits for tasks assessed  VISION: Subjective report: no changes Baseline vision: Wears glasses for reading only   TODAY'S TREATMENT:                                                                                       02/19/23 Cup stacking: engaged in picking up cups from L side of body and placing at midline with LUE.  Progressed from pyramid stacking and removal to rotation of cups to challenge functional reach and motor control.   Pt continues to demonstrate decreased supination and pronation when UE not supported. Flip cards:  engaged in card flipping with focus on supination with OT providing cues to facilitate increased ROM.  Transitioned to rotation with cards in both directions.  Pt requiring stabilization of elbow along torso as he fatigued with increased reach. UE therex: engaged in wall slide in door frame to facilitate increased abduction and shoulder flexion, pt demonstrating fatigue with repetition but able to complete nearly full shoulder flexion with support of door frame.  Engaged in standing shoulder row and horizontal abduction in standing with red theraband anchored in doorframe.  OT providing initial demonstration and cue for improved technique.  Pt demonstrating difficulty with shoulder horizontal abduction, but able to complete in lower range at torso height.  Exercises:  - Standing Shoulder Row with Anchored Resistance  - 2 x daily - 2 sets - 10 reps - Shoulder Horizontal Abduction with Anchored Resistance  - 2 x daily - 2 sets - 10 reps   02/12/23 Nerve glides: attempted ulnar nerve glides and median glides with and without support of elevated table to allow for increased support.  Pt still with difficulty with maintaining LUE against gravity > 90* without support from table top or other hand.  Pt benefited from use of BUE during ulnar nerve flossing with hands in prayer stretch position, incorporating movements in horizontal and vertical pattern.  Pt completed median nerve flossing with ability to complete 80-90% of task demonstrating deceased internal rotation.   Grip strength: 76# on R, L: 8# in neutral and 11# with wrist and elbow flexion.  Engaged in grip and finger extension with use of hand master.  Reiterated use of rubber band at home/work to facilitate similar movement/strengthening for finger extension.   Coordination: engaged in small peg board pattern replication with pt demonstrating increased  shoulder elevation and abduction to increase success, compensating for forearm supination/pronation and wrist movement.      02/06/23 Supine therex: Engaged in shoulder internal/external rotation with pt demonstrating ability to complete 50% of external rotation in supine. Transitioned to supine chest press with lightweight dowel with BUE to facilitate movement.  Pt attempted with LUE only, however unable to elicit movement without assistance of dowel.  Attempted with less stable dowel, with pt requiring  sturdiness of dowel to facilitate movement.  Pt was able to achieve modified chest press without dowel, however requiring compensation of shoulder abduction to 90* and then able to push up.  Horizontal abduction: attempted horizontal abduction with pt able to complete ~45* but unable to achieve greater ROM in sidelying.  Pt requested to attempt in supine with pt able to complete horizontal abduction/adduction to midline and out to table top with good motor control.  Discussed stability of scapulae when in supine vs sidelying.  Pt transitioned to completing horizontal abduction in sitting with 2# weights, demonstrating min scapular winging but ability to complete with some onset of fatigue with repetition.  Exercises - Supine Shoulder External and Internal Rotation in Abduction  - 2 x daily - 2 sets - 10 reps - Supine Shoulder Press AAROM in Abduction with Dowel  - 2 x daily - 2 sets - 10 reps - Sidelying Shoulder Horizontal Abduction  - 2 x daily - 3 sets - 10 reps    PATIENT EDUCATION: Educion details: ongoing condition specific education and UE ROM and strengatthening exercises Person educated: Patient Education method: Explanation, Demonstration, Verbal cues, and Handouts Education comprehension: verbalized understanding and needs further education  HOME EXERCISE PROGRAM: Access Code: [email or web address] URL: [email or web address] Date: 01/31/2023 Prepared by: [facility] - Outpatient  Rehab -  [facility]  01/23/23: Access Code: [email or web address] URL: [email or web address] Date: 01/23/2023 Prepared by: Fannie Knee  01/16/23: Access Code: [email or web address] URL: [email or web address] Date: 01/16/2023 Prepared by: [facility] - Outpatient  Rehab - [facility]  01/09/23: Access Code: [email or web address] URL: [email or web address] Date: 01/09/2023 Prepared by: [facility] - Outpatient  Rehab - [facility]   GOALS: Goals reviewed with patient? Yes  SHORT TERM GOALS: Target date: 01/04/23  Pt will be independent with coordination and ROM HEP. Baseline: Goal status: MET - 12/31/22  2.  Pt will demonstrate increased coordination to be able to place all 9 pegs into peg board during 9 hole peg test in 2 min time limit. Baseline: able to place 5 in 2 min time limit Goal status: MET - 1:06.84 on 12/31/22  3.  Pt will demonstrate increased L shoulder ROM to allow for ability to retreive light weight object from shoulder height cabinet. Baseline: 88* shoulder flexion with abduction Goal status: IN PROGRESS  4.  Pt will demonstrate improved pinch and coordination as needed to complete clothing fasteners (buttons, tying shoes, etc). Baseline:  Goal status: MET - 01/02/23   LONG TERM GOALS:  Target date: 03/14/23  Pt will be independent with advanced HEP. Baseline:  Goal status: IN PROGRESS  2.  Pt will demonstrate improved fine motor coordination for ADLs as evidenced by being able to complete 9 hole peg test in 45 seconds or less Baseline: 1:06.84 on 12/31/22 Goal status: IN PROGRESS  3.  Pt will demonstrate increased L shoulder ROM and strength to tolerate holding phone to ear for 5 mins phone conversation. Baseline:  Goal status: IN PROGRESS  4.  Pt will report improved LUE functional use as evidenced by increased score on UEFS to > 70%. Baseline: 57.5% on 01/21/23 Goal status: IN PROGRESS  5. Pt will report improved shoulder ROM and  strength to be able to tie a neck tie.  Baseline:  Goal status: IN PROGRESS  6.  Pt will demonstrate improved sustained grasp in L hand to carry a coffee cup 50' without spilling.  Baseline:  Goal status: IN PROGRESS  7.  Pt will demonstrate and/or report improved coordination to  demonstrate improved ease/speed of typing by increased WPM >/= to 10 WPM  Baseline: 22  Goal status: IN PROGRESS    ASSESSMENT:  CLINICAL IMPRESSION: Pt continues to benefit from stabilization or support of forearm along torso with supination/pronation and rotation tasks.  Pt demonstrating good rotation and stacking with cups, min cues for change of hand placement to further facilitate grasp and rotation.  Pt demonstrating ability to tolerate UE strengthening with red theraband, however continues to benefit from bimanual use. Plan to further address grip strengthening as appropriate.   PLAN:  OT FREQUENCY: 2x/week  OT DURATION: 6 weeks (plan to only be seen for 4 weeks but asking for 6 due to scheduling conflicts)  PLANNED INTERVENTIONS: self care/ADL training, therapeutic exercise, therapeutic activity, neuromuscular re-education, manual therapy, passive range of motion, balance training, functional mobility training, electrical stimulation, ultrasound, moist heat, cryotherapy, patient/family education, psychosocial skills training, energy conservation, coping strategies training, and DME and/or AE instructions  RECOMMENDED OTHER SERVICES: NA  CONSULTED AND AGREED WITH PLAN OF CARE: Patient  PLAN FOR NEXT SESSION:  Keep emphasizing stretching into internal and external rotation of the shoulder as well as strengthening the external rotators (teres minor and infraspinatus groups).  Review fine motor skills as needed keep on ulnar nerve gliding.  Need to consolidate HEP into one. Address grip strengthening as appropriate.   Rosalio Loud, OTR/L 02/19/2023, 12:16 PM

## 2023-02-21 ENCOUNTER — Ambulatory Visit: Payer: Commercial Managed Care - PPO | Admitting: Occupational Therapy

## 2023-02-21 DIAGNOSIS — R208 Other disturbances of skin sensation: Secondary | ICD-10-CM

## 2023-02-21 DIAGNOSIS — R278 Other lack of coordination: Secondary | ICD-10-CM | POA: Diagnosis not present

## 2023-02-21 DIAGNOSIS — M6281 Muscle weakness (generalized): Secondary | ICD-10-CM

## 2023-02-21 DIAGNOSIS — M79602 Pain in left arm: Secondary | ICD-10-CM | POA: Diagnosis not present

## 2023-02-21 DIAGNOSIS — M79642 Pain in left hand: Secondary | ICD-10-CM | POA: Diagnosis not present

## 2023-02-21 NOTE — Therapy (Signed)
OUTPATIENT OCCUPATIONAL THERAPY NEURO  Treatment Session  Patient Name: Johnny Robinson MRN: [id number] DOB:21-Jun-1966, 57 y.o., male Today's Date: 02/21/2023  PCP: Marguarite Arbour, MD REFERRING PROVIDER: Jacquelynn Cree, PA-C    END OF SESSION:  OT End of Session - 02/21/23 1513     Visit Number 19    Number of Visits 22    Date for OT Re-Evaluation 03/14/23    Authorization Type [employer] employee - [employer] PPO    OT Start Time 1319    OT Stop Time 1402    OT Time Calculation (min) 43 min    Activity Tolerance Patient tolerated treatment well;No increased pain;Patient limited by fatigue    Behavior During Therapy [facility] for tasks assessed/performed                   Past Medical History:  Diagnosis Date   Arthritis    Chronic kidney disease 2016   RENAL INFARCT   GERD (gastroesophageal reflux disease)    Hypertension    Reflux    Subdural hematoma (HCC) 1991   Past Surgical History:  Procedure Laterality Date   BACK SURGERY  11/2017   burr hole  1992   to evacuate SDH   COLONOSCOPY WITH PROPOFOL N/A 09/02/2018   Procedure: COLONOSCOPY WITH PROPOFOL;  Surgeon: Midge Minium, MD;  Location: [facility] ENDOSCOPY;  Service: Endoscopy;  Laterality: N/A;   EXCISION MASS ABDOMINAL Left 05/28/2018   Procedure: EXCISIONAL BIOPSY MASS ABDOMINAL WALL;  Surgeon: Carolan Shiver, MD;  Location: [facility] ORS;  Service: General;  Laterality: Left;   PERIPHERAL VASCULAR CATHETERIZATION Right 05/18/2015   Procedure: Renal Angiography;  Surgeon: Annice Needy, MD;  Location: [facility] INVASIVE CV LAB;  Service: Cardiovascular;  Laterality: Right;   PERIPHERAL VASCULAR CATHETERIZATION Bilateral 05/18/2015   Procedure: Renal Intervention;  Surgeon: Annice Needy, MD;  Location: [facility] INVASIVE CV LAB;  Service: Cardiovascular;  Laterality: Bilateral;   TOTAL HIP ARTHROPLASTY Left 03/07/2022   Procedure: TOTAL HIP ARTHROPLASTY ANTERIOR APPROACH;  Surgeon: Ollen Gross, MD;  Location: [facility] ORS;   Service: Orthopedics;  Laterality: Left;   Patient Active Problem List   Diagnosis Date Noted   Nerve pain 12/31/2022   Left shoulder pain 11/30/2022   Urinary hesitancy 11/30/2022   Constipation 11/30/2022   Biceps tendinitis of left shoulder 11/30/2022   Multifocal pneumonia 11/30/2022   Chronic pain syndrome 11/21/2022   HNP (herniated nucleus pulposus) with myelopathy, cervical 11/15/2022   Cervical myelopathy (HCC) 11/13/2022   OA (osteoarthritis) of hip 03/07/2022   Primary osteoarthritis of left hip 03/07/2022   GERD (gastroesophageal reflux disease) 11/27/2021   Encounter for screening colonoscopy    Benign neoplasm of rectosigmoid junction    Hyperlipidemia 12/18/2017   Essential hypertension 12/17/2016   Chest pain 05/30/2015   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    ONSET DATE: 11/13/22  REFERRING DIAG: G95.9 (ICD-10-CM) - Cervical myelopathy  THERAPY DIAG:  Muscle weakness (generalized)  Other lack of coordination  Pain in left arm  Other disturbances of skin sensation  Rationale for Evaluation and Treatment: Rehabilitation  PERTINENT HISTORY: 57 year old R handed male with 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 Dr.  Pool expressed concerns of cord contusion due to worsening of myelopathy and MRI spine done which showed post op changes form ACDF C3-C6 and patchy signal abnormality involving the central and left cord at C5/6 concerning for contusion/injury.  PRECAUTIONS: Cervical and Other: lifting precautions not > 5#  WEIGHT BEARING RESTRICTIONS: No  SUBJECTIVE:   SUBJECTIVE STATEMENT: Pt reports doing some white  water rafting on Friday, requiring a lot of pushing/pulling with L hand and hiked [place] over the weekend.   Pt accompanied by: self   PAIN:  Are you having pain?  Not at rest today  FALLS: Has patient fallen in last 6 months? No  LIVING ENVIRONMENT: Lives with: lives with their spouse and 42 yo Lives in: House/apartment Stairs: Yes: Internal: full flight steps; on right going up and External: 4 steps; none Has following equipment at home: Quad cane small base, Walker - 2 wheeled, shower chair, bed side commode, and hand held shower head  PLOF: Independent, Independent with basic ADLs, and Vocation/Vocational requirements: practices law  PATIENT GOALS: pt would like to get arm progressing like leg    OBJECTIVE: (All objective assessments below are from initial evaluation on: 12/06/22 unless otherwise specified.)   HAND DOMINANCE: Right  ADLs: Overall ADLs: requires increased time and thought to complete Transfers/ambulation related to ADLs: Mod I with SBQC Eating: requires assistance to cut food Grooming: Mod I UB Dressing: requiring assistance with buttons LB Dressing: requiring assistance with socks/tying shoes Toileting: Mod I Bathing: Mod I Tub Shower transfers: Mod I, has built in shower seat Equipment: Walk in shower  IADLs: Light housekeeping: has attempted vacuuming without issue Meal Prep: has attempted cooking, spouse or children assisting with retrieving items if >5# Medication management: spouse is filling pillbox and is reminding him to take  MOBILITY STATUS: Needs Assist: Mod I - Supervision with SBQC  POSTURE COMMENTS:  rounded shoulders and forward head  FUNCTIONAL OUTCOME MEASURES: Upper Extremity Functional Scale (UEFS): 33/80 =  41.25% function  UPPER EXTREMITY ROM:    Active ROM Left eval Left 01/16/23  Shoulder flexion 88 (requiring abduction to achieve) 110  Shoulder abduction 85 90  Shoulder adduction    Shoulder extension    Shoulder  internal rotation 90% WNL  Shoulder external rotation 30% 30%  Elbow flexion 100 WNL  Elbow extension WNL WNL  Wrist flexion WNL WNL  Wrist extension To neutral 10*  Wrist ulnar deviation trace 20*  Wrist radial deviation trace 18*  Wrist pronation    Wrist supination To neutral To neutral (without bracing, does have full range when bracing at elbow)  (Blank rows = not tested)  UPPER EXTREMITY MMT:     MMT Left eval Left 01/16/23  Shoulder flexion 2-/5 3-/5  Shoulder abduction    Shoulder adduction    Shoulder extension    Shoulder internal rotation    Shoulder external rotation    Middle trapezius    Lower trapezius    Elbow flexion 4-/5   Elbow extension 4-/5   Wrist flexion 3-/5 3/5  Wrist extension 3-/5 3/5  Wrist ulnar deviation    Wrist radial deviation    Wrist pronation    Wrist supination    (Blank rows = not tested)  HAND FUNCTION: Loose gross grasp, unable to assess strength on dynamometer  COORDINATION: Finger Nose Finger test: able to reach chin to finger with LUE, decreased shoulder ROM limiting movement 9 Hole Peg test: Right: 23.03 sec; Left: able to place 5 pegs  in 2 min time limit (pt needing to use side of container/multiple pegs to leverage to pick up one peg, also with difficulty with manipulation/translation in finger tips to orient to place in peg hole) Box and Blocks:  Right 44 blocks, Left 32 blocks  SENSATION: Reports duller sensation on L compared to R  COGNITION: Overall cognitive status: Within functional limits for tasks assessed  VISION: Subjective report: no changes Baseline vision: Wears glasses for reading only   TODAY'S TREATMENT:                                                                                       02/21/23 Finger ROM: engaged in finger extension on table top with sliding fingers from partially flexed into extension, isolated finger extension from table top, and increased strengthening with use of rubber band to  further facilitate stretching/strengthening of finger extensors. UE ROM: OT instructed pt in tricep stretch on table top with LUE and then BUE with focus on positioning and hold to facilitate increased stretch.  Progressed to table slides into abduction with sustained hold at end range to facilitate increased stretch. ROM/strengthening: engaged in shoulder flexion and abduction with 1# dumbbells to challenge ROM and assess strength. OT reiterated importance of proper ROM and not to increase weight too much and provoking compensatory strategies/movements.  Engaged in bicep curls in supinated, neutral, and pronated position with pt able tc complete in supinated position and significant difficulty in neutral - recruiting internal rotation to achieve positioning, and pt unable to complete in pronated position.  New Exercises:  - Child's Pose Tricep Stretch and Knees Apart   - 2 x daily - 2 sets - 10 reps - Seated Shoulder Abduction Towel Slide at Table Top  - 2 x daily - 2 sets - 10 reps - Shoulder Abduction with Dumbbells - Thumbs Up  - 2 x daily - 2 sets - 10 reps    02/19/23 Cup stacking: engaged in picking up cups from L side of body and placing at midline with LUE.  Progressed from pyramid stacking and removal to rotation of cups to challenge functional reach and motor control.  Pt continues to demonstrate decreased supination and pronation when UE not supported. Flip cards:  engaged in card flipping with focus on supination with OT providing cues to facilitate increased ROM.  Transitioned to rotation with cards in both directions.  Pt requiring stabilization of elbow along torso as he fatigued with increased reach. UE therex: engaged in wall slide in door frame to facilitate increased abduction and shoulder flexion, pt demonstrating fatigue with repetition but able to complete nearly full shoulder flexion with support of door frame.  Engaged in standing shoulder row and horizontal abduction in standing  with red theraband anchored in doorframe.  OT providing initial demonstration and cue for improved technique.  Pt demonstrating difficulty with shoulder horizontal abduction, but able to complete in lower range at torso height.  Exercises:  - Standing Shoulder Row with Anchored Resistance  - 2 x daily - 2 sets - 10 reps - Shoulder Horizontal Abduction with Anchored Resistance  - 2 x daily - 2 sets - 10  reps   02/12/23 Nerve glides: attempted ulnar nerve glides and median glides with and without support of elevated table to allow for increased support.  Pt still with difficulty with maintaining LUE against gravity > 90* without support from table top or other hand.  Pt benefited from use of BUE during ulnar nerve flossing with hands in prayer stretch position, incorporating movements in horizontal and vertical pattern.  Pt completed median nerve flossing with ability to complete 80-90% of task demonstrating deceased internal rotation.   Grip strength: 76# on R, L: 8# in neutral and 11# with wrist and elbow flexion.  Engaged in grip and finger extension with use of hand master.  Reiterated use of rubber band at home/work to facilitate similar movement/strengthening for finger extension.   Coordination: engaged in small peg board pattern replication with pt demonstrating increased shoulder elevation and abduction to increase success, compensating for forearm supination/pronation and wrist movement.      PATIENT EDUCATION: Educion details: ongoing condition specific education and UE ROM and strengatthening exercises Person educated: Patient Education method: Explanation, Demonstration, Verbal cues, and Handouts Education comprehension: verbalized understanding and needs further education  HOME EXERCISE PROGRAM: Access Code: [email or web address] URL: [email or web address] Date: 01/31/2023 Prepared by: [facility] - Outpatient  Rehab - [facility]  01/23/23: Access Code: [email or web address] URL:  [email or web address] Date: 01/23/2023 Prepared by: Fannie Knee  01/16/23: Access Code: [email or web address] URL: [email or web address] Date: 01/16/2023 Prepared by: [facility] - Outpatient  Rehab - [facility]  01/09/23: Access Code: [email or web address] URL: [email or web address] Date: 01/09/2023 Prepared by: [facility] - Outpatient  Rehab - [facility]   GOALS: Goals reviewed with patient? Yes  SHORT TERM GOALS: Target date: 01/04/23  Pt will be independent with coordination and ROM HEP. Baseline: Goal status: MET - 12/31/22  2.  Pt will demonstrate increased coordination to be able to place all 9 pegs into peg board during 9 hole peg test in 2 min time limit. Baseline: able to place 5 in 2 min time limit Goal status: MET - 1:06.84 on 12/31/22  3.  Pt will demonstrate increased L shoulder ROM to allow for ability to retreive light weight object from shoulder height cabinet. Baseline: 88* shoulder flexion with abduction Goal status: IN PROGRESS  4.  Pt will demonstrate improved pinch and coordination as needed to complete clothing fasteners (buttons, tying shoes, etc). Baseline:  Goal status: MET - 01/02/23   LONG TERM GOALS:  Target date: 03/14/23  Pt will be independent with advanced HEP. Baseline:  Goal status: IN PROGRESS  2.  Pt will demonstrate improved fine motor coordination for ADLs as evidenced by being able to complete 9 hole peg test in 45 seconds or less Baseline: 1:06.84 on 12/31/22 Goal status: IN PROGRESS  3.  Pt will demonstrate increased L shoulder ROM and strength to tolerate holding phone to ear for 5 mins phone conversation. Baseline:  Goal status: IN PROGRESS  4.  Pt will report improved LUE functional use as evidenced by increased score on UEFS to > 70%. Baseline: 57.5% on 01/21/23 Goal status: IN PROGRESS  5. Pt will report improved shoulder ROM and strength to be able to tie a neck tie.  Baseline:  Goal status: IN  PROGRESS  6.  Pt will demonstrate improved sustained grasp in L hand to carry a coffee cup 50' without spilling.  Baseline:  Goal status: IN PROGRESS  7.  Pt will demonstrate and/or report improved coordination to demonstrate improved ease/speed of typing by increased WPM >/= to  10 WPM  Baseline: 22  Goal status: IN PROGRESS    ASSESSMENT:  CLINICAL IMPRESSION: Pt is demonstrating good compensatory strategies to compensate for decreased shoulder ROM, especially >90* and in abduction.  Pt continues to benefit from stabilization or support of forearm along torso with supination/pronation and rotation tasks.  Pt demonstrating good finger extension, would benefit from additional strengthening activities. Plan to further address grip strengthening as appropriate.  Encouraged pt to assess what tasks continue to present a challenge from ADLs, IADLs, to work related tasks and report at next session.   PLAN:  OT FREQUENCY: 2x/week  OT DURATION: 6 weeks (plan to only be seen for 4 weeks but asking for 6 due to scheduling conflicts)  PLANNED INTERVENTIONS: self care/ADL training, therapeutic exercise, therapeutic activity, neuromuscular re-education, manual therapy, passive range of motion, balance training, functional mobility training, electrical stimulation, ultrasound, moist heat, cryotherapy, patient/family education, psychosocial skills training, energy conservation, coping strategies training, and DME and/or AE instructions  RECOMMENDED OTHER SERVICES: NA  CONSULTED AND AGREED WITH PLAN OF CARE: Patient  PLAN FOR NEXT SESSION:  Keep emphasizing stretching into internal and external rotation of the shoulder as well as strengthening the external rotators (teres minor and infraspinatus groups).  Review fine motor skills as needed keep on ulnar nerve gliding.  Need to consolidate HEP into one. Address grip strengthening as appropriate.   Rosalio Loud, OTR/L 02/21/2023, 3:14 PM

## 2023-03-04 ENCOUNTER — Ambulatory Visit: Payer: Commercial Managed Care - PPO | Admitting: Occupational Therapy

## 2023-03-04 DIAGNOSIS — M79602 Pain in left arm: Secondary | ICD-10-CM | POA: Diagnosis not present

## 2023-03-04 DIAGNOSIS — R208 Other disturbances of skin sensation: Secondary | ICD-10-CM | POA: Diagnosis not present

## 2023-03-04 DIAGNOSIS — M79642 Pain in left hand: Secondary | ICD-10-CM

## 2023-03-04 DIAGNOSIS — M6281 Muscle weakness (generalized): Secondary | ICD-10-CM

## 2023-03-04 DIAGNOSIS — R278 Other lack of coordination: Secondary | ICD-10-CM

## 2023-03-04 NOTE — Therapy (Signed)
OUTPATIENT OCCUPATIONAL THERAPY NEURO  Treatment Session  Patient Name: Johnny Robinson MRN: [id number] DOB:[birth year], 57 y.o., male Today's Date: 03/04/2023  PCP: Marguarite Arbour, MD REFERRING PROVIDER: Jacquelynn Cree, PA-C    END OF SESSION:  OT End of Session - 03/04/23 1150     Visit Number 20    Number of Visits 22    Date for OT Re-Evaluation 03/14/23    Authorization Type [employer] employee - [employer] P[employer]    OT Start Time 1148    OT Stop Time 1230    OT Time Calculation (min) 42 min    Activity Tolerance Patient tolerated treatment well;No increased pain;Patient limited by fatigue    Behavior During Therapy [facility] for tasks assessed/performed                    Past Medical History:  Diagnosis Date   Arthritis    Chronic kidney disease 2016   RENAL INFARCT   GERD (gastroesophageal reflux disease)    Hypertension    Reflux    Subdural hematoma (HCC) 1991   Past Surgical History:  Procedure Laterality Date   BACK SURGERY  11/2017   burr hole  1992   to evacuate SDH   COLONOSCOPY WITH PROPOFOL N/A 09/02/2018   Procedure: COLONOSCOPY WITH PROPOFOL;  Surgeon: Midge Minium, MD;  Location: [facility] ENDOSCOPY;  Service: Endoscopy;  Laterality: N/A;   EXCISION MASS ABDOMINAL Left 05/28/2018   Procedure: EXCISIONAL BIOPSY MASS ABDOMINAL WALL;  Surgeon: Carolan Shiver, MD;  Location: [facility] ORS;  Service: General;  Laterality: Left;   PERIPHERAL VASCULAR CATHETERIZATION Right 05/18/2015   Procedure: Renal Angiography;  Surgeon: Annice Needy, MD;  Location: [facility] INVASIVE CV LAB;  Service: Cardiovascular;  Laterality: Right;   PERIPHERAL VASCULAR CATHETERIZATION Bilateral 05/18/2015   Procedure: Renal Intervention;  Surgeon: Annice Needy, MD;  Location: [facility] INVASIVE CV LAB;  Service: Cardiovascular;  Laterality: Bilateral;   TOTAL HIP ARTHROPLASTY Left 03/07/2022   Procedure: TOTAL HIP ARTHROPLASTY ANTERIOR APPROACH;  Surgeon: Ollen Gross, MD;  Location: [facility] ORS;   Service: Orthopedics;  Laterality: Left;   Patient Active Problem List   Diagnosis Date Noted   Nerve pain 12/31/2022   Left shoulder pain 11/30/2022   Urinary hesitancy 11/30/2022   Constipation 11/30/2022   Biceps tendinitis of left shoulder 11/30/2022   Multifocal pneumonia 11/30/2022   Chronic pain syndrome 11/21/2022   HNP (herniated nucleus pulposus) with myelopathy, cervical 11/15/2022   Cervical myelopathy (HCC) 11/13/2022   OA (osteoarthritis) of hip 03/07/2022   Primary osteoarthritis of left hip 03/07/2022   GERD (gastroesophageal reflux disease) 11/27/2021   Encounter for screening colonoscopy    Benign neoplasm of rectosigmoid junction    Hyperlipidemia 12/18/2017   Essential hypertension 12/17/2016   Chest pain 05/30/2015   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    ONSET DATE: 11/13/22  REFERRING DIAG: G95.9 (ICD-10-CM) - Cervical myelopathy  THERAPY DIAG:  Muscle weakness (generalized)  Other lack of coordination  Pain in left arm  Other disturbances of skin sensation  Pain in left hand  Rationale for Evaluation and Treatment: Rehabilitation  PERTINENT HISTORY: 57 year old R handed male with 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 Dr. Jordan Likes expressed concerns of cord contusion due to worsening of myelopathy and MRI spine done which showed post op changes form ACDF C3-C6 and patchy signal abnormality involving the central and left cord at C5/6 concerning for contusion/injury.  PRECAUTIONS: Cervical and Other: lifting precautions not > 5#  WEIGHT BEARING RESTRICTIONS: No  SUBJECTIVE:   SUBJECTIVE STATEMENT: Pt reports  doing some white water rafting on Friday, requiring a lot of pushing/pulling with L hand and hiked [place] over the weekend.   Pt accompanied by: self   PAIN:  Are you having pain?  Not at rest today  FALLS: Has patient fallen in last 6 months? No  LIVING ENVIRONMENT: Lives with: lives with their spouse and 23 yo Lives in: House/apartment Stairs: Yes: Internal: full flight steps; on right going up and External: 4 steps; none Has following equipment at home: Quad cane small base, Walker - 2 wheeled, shower chair, bed side commode, and hand held shower head  PLOF: Independent, Independent with basic ADLs, and Vocation/Vocational requirements: practices law  PATIENT GOALS: pt would like to get arm progressing like leg    OBJECTIVE: (All objective assessments below are from initial evaluation on: 12/06/22 unless otherwise specified.)   HAND DOMINANCE: Right  ADLs: Overall ADLs: requires increased time and thought to complete Transfers/ambulation related to ADLs: Mod I with SBQC Eating: requires assistance to cut food Grooming: Mod I UB Dressing: requiring assistance with buttons LB Dressing: requiring assistance with socks/tying shoes Toileting: Mod I Bathing: Mod I Tub Shower transfers: Mod I, has built in shower seat Equipment: Walk in shower  IADLs: Light housekeeping: has attempted vacuuming without issue Meal Prep: has attempted cooking, spouse or children assisting with retrieving items if >5# Medication management: spouse is filling pillbox and is reminding him to take  MOBILITY STATUS: Needs Assist: Mod I - Supervision with SBQC  POSTURE COMMENTS:  rounded shoulders and forward head  FUNCTIONAL OUTCOME MEASURES: Upper Extremity Functional Scale (UEFS): 33/80 =  41.25% function  UPPER EXTREMITY ROM:    Active ROM Left eval Left 01/16/23 Left 03/04/23  Shoulder flexion 88 (requiring abduction to achieve) 110 110  Shoulder abduction 85 90 90  Shoulder adduction      Shoulder extension     Shoulder internal rotation 90% WNL 90%  Shoulder external rotation 30% 30% 30%  Elbow flexion 100 WNL   Elbow extension WNL WNL   Wrist flexion WNL WNL   Wrist extension To neutral 10* 40  Wrist ulnar deviation trace 20* 20  Wrist radial deviation trace 18* 25  Wrist pronation     Wrist supination To neutral To neutral (without bracing, does have full range when bracing at elbow) To neutral (without bracing, does have full range when bracing at elbow)  (Blank rows = not tested)  UPPER EXTREMITY MMT:     MMT Left eval Left 01/16/23 Left 03/04/23  Shoulder flexion 2-/5 3-/5   Shoulder abduction     Shoulder adduction     Shoulder extension     Shoulder internal rotation     Shoulder external rotation     Middle trapezius     Lower trapezius     Elbow flexion 4-/5    Elbow extension 4-/5    Wrist flexion 3-/5 3/5   Wrist extension 3-/5 3/5   Wrist ulnar deviation     Wrist radial deviation     Wrist pronation     Wrist supination     (  Blank rows = not tested)  HAND FUNCTION: 03/04/23: Grip strength: R: 75# and L: 12#  COORDINATION: Finger Nose Finger test: able to reach chin to finger with LUE, decreased shoulder ROM limiting movement 9 Hole Peg test: Right: 23.03 sec; Left: able to place 5 pegs in 2 min time limit (pt needing to use side of container/multiple pegs to leverage to pick up one peg, also with difficulty with manipulation/translation in finger tips to orient to place in peg hole) 03/04/23: L: 39.63 sec Box and Blocks:  Right 44 blocks, Left 32 blocks  SENSATION: Reports duller sensation on L compared to R  COGNITION: Overall cognitive status: Within functional limits for tasks assessed  VISION: Subjective report: no changes Baseline vision: Wears glasses for reading only   TODAY'S TREATMENT:                                                                                       03/04/23 Reviewed previous exercises to further  facilitate shoulder flexion and abduction to aid in tying necktie and holding phone to L ear.  Engaged in child's pose tricep stretch on table top as well as wall slides while addressing sustained stretch within pain tolerance.  OT provided demonstration on open book stretch with LUE stabilized on wall to facilitate increased abduction and external rotation.  Pt demonstrating improved chest opening with UE stabilized on wall with more dynamic movement when compared to attempts to complete with bracing at doorframe. Coordination: Pt completed 9 hole peg test in 39 seconds with L hand, demonstrating continued improvements in [facility]. Grip strength: pt continues to demonstrate significantly impaired grip strength in L hand, largely due to decreased flexion of 4th and 5th digits.  Pt able to achieve 10-12# with thumb and first 2 fingers and with attempts at full fist.  Reiterated use of theraputty for grip strength as well as use of rubber band for finger extension.   02/21/23 Finger ROM: engaged in finger extension on table top with sliding fingers from partially flexed into extension, isolated finger extension from table top, and increased strengthening with use of rubber band to further facilitate stretching/strengthening of finger extensors. UE ROM: OT instructed pt in tricep stretch on table top with LUE and then BUE with focus on positioning and hold to facilitate increased stretch.  Progressed to table slides into abduction with sustained hold at end range to facilitate increased stretch. ROM/strengthening: engaged in shoulder flexion and abduction with 1# dumbbells to challenge ROM and assess strength. OT reiterated importance of proper ROM and not to increase weight too much and provoking compensatory strategies/movements.  Engaged in bicep curls in supinated, neutral, and pronated position with pt able tc complete in supinated position and significant difficulty in neutral - recruiting internal rotation to  achieve positioning, and pt unable to complete in pronated position.  New Exercises:  - Child's Pose Tricep Stretch and Knees Apart   - 2 x daily - 2 sets - 10 reps - Seated Shoulder Abduction Towel Slide at Table Top  - 2 x daily - 2 sets - 10 reps - Shoulder Abduction with Dumbbells - Thumbs Up  - 2  x daily - 2 sets - 10 reps    02/19/23 Cup stacking: engaged in picking up cups from L side of body and placing at midline with LUE.  Progressed from pyramid stacking and removal to rotation of cups to challenge functional reach and motor control.  Pt continues to demonstrate decreased supination and pronation when UE not supported. Flip cards:  engaged in card flipping with focus on supination with OT providing cues to facilitate increased ROM.  Transitioned to rotation with cards in both directions.  Pt requiring stabilization of elbow along torso as he fatigued with increased reach. UE therex: engaged in wall slide in door frame to facilitate increased abduction and shoulder flexion, pt demonstrating fatigue with repetition but able to complete nearly full shoulder flexion with support of door frame.  Engaged in standing shoulder row and horizontal abduction in standing with red theraband anchored in doorframe.  OT providing initial demonstration and cue for improved technique.  Pt demonstrating difficulty with shoulder horizontal abduction, but able to complete in lower range at torso height.  Exercises:  - Standing Shoulder Row with Anchored Resistance  - 2 x daily - 2 sets - 10 reps - Shoulder Horizontal Abduction with Anchored Resistance  - 2 x daily - 2 sets - 10 reps    PATIENT EDUCATION: Educion details: ongoing condition specific education and UE ROM and strengatthening exercises Person educated: Patient Education method: Explanation, Demonstration, Verbal cues, and Handouts Education comprehension: verbalized understanding and needs further education  HOME EXERCISE PROGRAM: Access  Code: [email or web address] URL: [email or web address] Date: 01/31/2023 Prepared by: [facility] - Outpatient  Rehab - [facility]  01/23/23: Access Code: [email or web address] URL: [email or web address] Date: 01/23/2023 Prepared by: Fannie Knee  01/16/23: Access Code: [email or web address] URL: [email or web address] Date: 01/16/2023 Prepared by: [facility] - Outpatient  Rehab - [facility]  01/09/23: Access Code: [email or web address] URL: [email or web address] Date: 01/09/2023 Prepared by: [facility] - Outpatient  Rehab - [facility]   GOALS: Goals reviewed with patient? Yes  SHORT TERM GOALS: Target date: 01/04/23  Pt will be independent with coordination and ROM HEP. Baseline: Goal status: MET - 12/31/22  2.  Pt will demonstrate increased coordination to be able to place all 9 pegs into peg board during 9 hole peg test in 2 min time limit. Baseline: able to place 5 in 2 min time limit Goal status: MET - 1:06.84 on 12/31/22  3.  Pt will demonstrate increased L shoulder ROM to allow for ability to retreive light weight object from shoulder height cabinet. Baseline: 88* shoulder flexion with abduction Goal status: IN PROGRESS  4.  Pt will demonstrate improved pinch and coordination as needed to complete clothing fasteners (buttons, tying shoes, etc). Baseline:  Goal status: MET - 01/02/23   LONG TERM GOALS:  Target date: 03/14/23  Pt will be independent with advanced HEP. Baseline:  Goal status: IN PROGRESS  2.  Pt will demonstrate improved fine motor coordination for ADLs as evidenced by being able to complete 9 hole peg test in 45 seconds or less Baseline: 1:06.84 on 12/31/22 Goal status: MET - 39.63 sec on 03/04/23  3.  Pt will demonstrate increased L shoulder ROM and strength to tolerate holding phone to ear for 5 mins phone conversation. Baseline:  Goal status: IN PROGRESS   4.  Pt will report improved LUE functional use as evidenced by increased  score on UEFS to > 70%. Baseline: 57.5% on 01/21/23 Goal status: IN PROGRESS  5. Pt will report improved shoulder ROM  and strength to be able to tie a neck tie.  Baseline:  Goal status: IN PROGRESS  6.  Pt will demonstrate improved sustained grasp in L hand to carry a coffee cup 50' without spilling.  Baseline:  Goal status: IN PROGRESS  7.  Pt will demonstrate and/or report improved coordination to demonstrate improved ease/speed of typing by increased WPM >/= to 10 WPM  Baseline: 22  Goal status: IN PROGRESS    ASSESSMENT:  CLINICAL IMPRESSION: Pt is demonstrating good compensatory strategies to compensate for decreased shoulder ROM, especially >90* and in abduction.  Pt demonstrating improvements in [facility] tasks and would benefit from additional strengthening activities for grip and pinch strength.  Discussed limited improvements from a measurement standpoint and limitations due to area of injury/surgery.  Plan to f/u with PM&R mid July and discussed possible hold on therapy at end of cert vs continued therapy based on progress.  Encouraged pt to assess what tasks continue to present a challenge from ADLs, IADLs, to work related tasks and report at next session.   PLAN:  OT FREQUENCY: 2x/week  OT DURATION: 6 weeks (plan to only be seen for 4 weeks but asking for 6 due to scheduling conflicts)  PLANNED INTERVENTIONS: self care/ADL training, therapeutic exercise, therapeutic activity, neuromuscular re-education, manual therapy, passive range of motion, balance training, functional mobility training, electrical stimulation, ultrasound, moist heat, cryotherapy, patient/family education, psychosocial skills training, energy conservation, coping strategies training, and DME and/or AE instructions  RECOMMENDED OTHER SERVICES: NA  CONSULTED AND AGREED WITH PLAN OF CARE: Patient  PLAN FOR NEXT SESSION:  Keep emphasizing stretching into internal and external rotation of the shoulder as well  as strengthening the external rotators (teres minor and infraspinatus groups).  Review fine motor skills as needed keep on ulnar nerve gliding.  Need to consolidate HEP into one. Address grip strengthening as appropriate - resistive clothespins vs hand gripper as able.  Rosalio Loud, OTR/L 03/04/2023, 11:51 AM

## 2023-03-06 ENCOUNTER — Ambulatory Visit: Payer: Commercial Managed Care - PPO | Admitting: Occupational Therapy

## 2023-03-11 ENCOUNTER — Ambulatory Visit: Payer: Commercial Managed Care - PPO | Admitting: Occupational Therapy

## 2023-03-11 DIAGNOSIS — M6281 Muscle weakness (generalized): Secondary | ICD-10-CM

## 2023-03-11 DIAGNOSIS — R278 Other lack of coordination: Secondary | ICD-10-CM | POA: Diagnosis not present

## 2023-03-11 DIAGNOSIS — M79602 Pain in left arm: Secondary | ICD-10-CM | POA: Diagnosis not present

## 2023-03-11 DIAGNOSIS — R208 Other disturbances of skin sensation: Secondary | ICD-10-CM

## 2023-03-11 DIAGNOSIS — M79642 Pain in left hand: Secondary | ICD-10-CM | POA: Diagnosis not present

## 2023-03-11 NOTE — Therapy (Signed)
OUTPATIENT OCCUPATIONAL THERAPY NEURO  Treatment Session  Patient Name: Johnny Robinson MRN: [id number] DOB:03-30-1966, 57 y.o., male Today's Date: 03/11/2023  PCP: Marguarite Arbour, MD REFERRING PROVIDER: Jacquelynn Cree, PA-C    END OF SESSION:  OT End of Session - 03/11/23 1202     Visit Number 21    Number of Visits 22    Date for OT Re-Evaluation 03/14/23    Authorization Type [employer] employee - [employer] PPO    OT Start Time 1148    OT Stop Time 1230    OT Time Calculation (min) 42 min    Activity Tolerance Patient tolerated treatment well;No increased pain;Patient limited by fatigue    Behavior During Therapy [facility] for tasks assessed/performed                     Past Medical History:  Diagnosis Date   Arthritis    Chronic kidney disease 2016   RENAL INFARCT   GERD (gastroesophageal reflux disease)    Hypertension    Reflux    Subdural hematoma (HCC) 1991   Past Surgical History:  Procedure Laterality Date   BACK SURGERY  11/2017   burr hole  1992   to evacuate SDH   COLONOSCOPY WITH PROPOFOL N/A 09/02/2018   Procedure: COLONOSCOPY WITH PROPOFOL;  Surgeon: Midge Minium, MD;  Location: [facility] ENDOSCOPY;  Service: Endoscopy;  Laterality: N/A;   EXCISION MASS ABDOMINAL Left 05/28/2018   Procedure: EXCISIONAL BIOPSY MASS ABDOMINAL WALL;  Surgeon: Carolan Shiver, MD;  Location: [facility] ORS;  Service: General;  Laterality: Left;   PERIPHERAL VASCULAR CATHETERIZATION Right 05/18/2015   Procedure: Renal Angiography;  Surgeon: Annice Needy, MD;  Location: [facility] INVASIVE CV LAB;  Service: Cardiovascular;  Laterality: Right;   PERIPHERAL VASCULAR CATHETERIZATION Bilateral 05/18/2015   Procedure: Renal Intervention;  Surgeon: Annice Needy, MD;  Location: [facility] INVASIVE CV LAB;  Service: Cardiovascular;  Laterality: Bilateral;   TOTAL HIP ARTHROPLASTY Left 03/07/2022   Procedure: TOTAL HIP ARTHROPLASTY ANTERIOR APPROACH;  Surgeon: Ollen Gross, MD;  Location: [facility] ORS;   Service: Orthopedics;  Laterality: Left;   Patient Active Problem List   Diagnosis Date Noted   Nerve pain 12/31/2022   Left shoulder pain 11/30/2022   Urinary hesitancy 11/30/2022   Constipation 11/30/2022   Biceps tendinitis of left shoulder 11/30/2022   Multifocal pneumonia 11/30/2022   Chronic pain syndrome 11/21/2022   HNP (herniated nucleus pulposus) with myelopathy, cervical 11/15/2022   Cervical myelopathy (HCC) 11/13/2022   OA (osteoarthritis) of hip 03/07/2022   Primary osteoarthritis of left hip 03/07/2022   GERD (gastroesophageal reflux disease) 11/27/2021   Encounter for screening colonoscopy    Benign neoplasm of rectosigmoid junction    Hyperlipidemia 12/18/2017   Essential hypertension 12/17/2016   Chest pain 05/30/2015   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    ONSET DATE: 11/13/22  REFERRING DIAG: G95.9 (ICD-10-CM) - Cervical myelopathy  THERAPY DIAG:  Muscle weakness (generalized)  Other lack of coordination  Pain in left arm  Other disturbances of skin sensation  Pain in left hand  Rationale for Evaluation and Treatment: Rehabilitation  PERTINENT HISTORY: 57 year old R handed male with 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 Dr. Jordan Likes expressed concerns of cord contusion due to worsening of myelopathy and MRI spine done which showed post op changes form ACDF C3-C6 and patchy signal abnormality involving the central and left cord at C5/6 concerning for contusion/injury.  PRECAUTIONS: Cervical and Other: lifting precautions not > 5#  WEIGHT BEARING RESTRICTIONS: No  SUBJECTIVE:   SUBJECTIVE STATEMENT: Pt reports  doing some driving in the [place] but feeling that it went pretty well.  "I realized again today that I cannot drink coffee when holding it in my L hand."  Still having difficulty with deadbolt. Pt accompanied by: self   PAIN:  Are you having pain?  Not at rest today  FALLS: Has patient fallen in last 6 months? No  LIVING ENVIRONMENT: Lives with: lives with their spouse and 57 yo Lives in: House/apartment Stairs: Yes: Internal: full flight steps; on right going up and External: 4 steps; none Has following equipment at home: Quad cane small base, Walker - 2 wheeled, shower chair, bed side commode, and hand held shower head  PLOF: Independent, Independent with basic ADLs, and Vocation/Vocational requirements: practices law  PATIENT GOALS: pt would like to get arm progressing like leg    OBJECTIVE: (All objective assessments below are from initial evaluation on: 12/06/22 unless otherwise specified.)   HAND DOMINANCE: Right  ADLs: Overall ADLs: requires increased time and thought to complete Transfers/ambulation related to ADLs: Mod I with SBQC Eating: requires assistance to cut food Grooming: Mod I UB Dressing: requiring assistance with buttons LB Dressing: requiring assistance with socks/tying shoes Toileting: Mod I Bathing: Mod I Tub Shower transfers: Mod I, has built in shower seat Equipment: Walk in shower  IADLs: Light housekeeping: has attempted vacuuming without issue Meal Prep: has attempted cooking, spouse or children assisting with retrieving items if >5# Medication management: spouse is filling pillbox and is reminding him to take  MOBILITY STATUS: Needs Assist: Mod I - Supervision with SBQC  POSTURE COMMENTS:  rounded shoulders and forward head  FUNCTIONAL OUTCOME MEASURES: Upper Extremity Functional Scale (UEFS): 33/80 =  41.25% function  UPPER EXTREMITY ROM:    Active ROM Left eval Left 01/16/23 Left 03/04/23  Shoulder flexion 88 (requiring abduction to  achieve) 110 110  Shoulder abduction 85 90 90  Shoulder adduction     Shoulder extension     Shoulder internal rotation 90% WNL 90%  Shoulder external rotation 30% 30% 30%  Elbow flexion 100 WNL   Elbow extension WNL WNL   Wrist flexion WNL WNL   Wrist extension To neutral 10* 40  Wrist ulnar deviation trace 20* 20  Wrist radial deviation trace 18* 25  Wrist pronation     Wrist supination To neutral To neutral (without bracing, does have full range when bracing at elbow) To neutral (without bracing, does have full range when bracing at elbow)  (Blank rows = not tested)  UPPER EXTREMITY MMT:     MMT Left eval Left 01/16/23 Left 03/04/23  Shoulder flexion 2-/5 3-/5   Shoulder abduction     Shoulder adduction     Shoulder extension     Shoulder internal rotation     Shoulder external rotation     Middle trapezius     Lower trapezius     Elbow flexion 4-/5    Elbow extension 4-/5    Wrist flexion 3-/5 3/5   Wrist extension 3-/5 3/5   Wrist ulnar deviation     Wrist radial deviation  Wrist pronation     Wrist supination     (Blank rows = not tested)  HAND FUNCTION: 03/04/23: Grip strength: R: 75# and L: 12#  COORDINATION: Finger Nose Finger test: able to reach chin to finger with LUE, decreased shoulder ROM limiting movement 9 Hole Peg test: Right: 23.03 sec; Left: able to place 5 pegs in 2 min time limit (pt needing to use side of container/multiple pegs to leverage to pick up one peg, also with difficulty with manipulation/translation in finger tips to orient to place in peg hole) 03/04/23: L: 39.63 sec Box and Blocks:  Right 44 blocks, Left 32 blocks  SENSATION: Reports duller sensation on L compared to R  COGNITION: Overall cognitive status: Within functional limits for tasks assessed  VISION: Subjective report: no changes Baseline vision: Wears glasses for reading only   TODAY'S TREATMENT:                                                                                        03/11/23 Resistance Clothespins: 4,6,8# with LUE for mid functional reaching and sustained pinch to place on horizontal dowel at chest height.  Pt able to complete with good effort with 4,6# clothespins, but only able to complete x1 with 8# clothespin.    Functional grasp: engaged in opening of various containers, screw/unscrew nuts and bolts, and pouring cup.  Pt utilizing BUE to open pill bottle and jar lid, attempting to focus on stabilization with RUE to facilitate increased functional grasp/use with LUE.  Pt reports difficulty opening coffee creamer lid with L hand.  Discussed modifications and cues to attempt with hand placement.  Attempted bringing cup to mouth with pt continuing to get "stuck" at chest height with inability to rotate to reach towards mouth with both empty and half full cup.  OT recommended use of straw to accommodate decreased shoulder flexion and internal rotation to reach to mouth. Nerve glides: Attempted ulnar nerve flossing, however pt able to achieve position but unable to move arm once in 90* abduction.  Pt able to complete movement forward, but unable to complete per instructions due to decreased tricep activation.  Reiterated modified tricep stretch at table top with pt completing with reports of good stretch.    03/04/23 Reviewed previous exercises to further facilitate shoulder flexion and abduction to aid in tying necktie and holding phone to L ear.  Engaged in child's pose tricep stretch on table top as well as wall slides while addressing sustained stretch within pain tolerance.  OT provided demonstration on open book stretch with LUE stabilized on wall to facilitate increased abduction and external rotation.  Pt demonstrating improved chest opening with UE stabilized on wall with more dynamic movement when compared to attempts to complete with bracing at doorframe. Coordination: Pt completed 9 hole peg test in 39 seconds with L hand, demonstrating  continued improvements in [facility]. Grip strength: pt continues to demonstrate significantly impaired grip strength in L hand, largely due to decreased flexion of 4th and 5th digits.  Pt able to achieve 10-12# with thumb and first 2 fingers and with attempts at full fist.  Reiterated use of theraputty for  grip strength as well as use of rubber band for finger extension.   02/21/23 Finger ROM: engaged in finger extension on table top with sliding fingers from partially flexed into extension, isolated finger extension from table top, and increased strengthening with use of rubber band to further facilitate stretching/strengthening of finger extensors. UE ROM: OT instructed pt in tricep stretch on table top with LUE and then BUE with focus on positioning and hold to facilitate increased stretch.  Progressed to table slides into abduction with sustained hold at end range to facilitate increased stretch. ROM/strengthening: engaged in shoulder flexion and abduction with 1# dumbbells to challenge ROM and assess strength. OT reiterated importance of proper ROM and not to increase weight too much and provoking compensatory strategies/movements.  Engaged in bicep curls in supinated, neutral, and pronated position with pt able tc complete in supinated position and significant difficulty in neutral - recruiting internal rotation to achieve positioning, and pt unable to complete in pronated position.  New Exercises:  - Child's Pose Tricep Stretch and Knees Apart   - 2 x daily - 2 sets - 10 reps - Seated Shoulder Abduction Towel Slide at Table Top  - 2 x daily - 2 sets - 10 reps - Shoulder Abduction with Dumbbells - Thumbs Up  - 2 x daily - 2 sets - 10 reps     PATIENT EDUCATION: Educion details: ongoing condition specific education and UE ROM and strengatthening exercises Person educated: Patient Education method: Explanation, Demonstration, Verbal cues, and Handouts Education comprehension: verbalized  understanding and needs further education  HOME EXERCISE PROGRAM: Access Code: [email or web address] URL: [email or web address] Date: 01/31/2023 Prepared by: [facility] - Outpatient  Rehab - [facility]  01/23/23: Access Code: [email or web address] URL: [email or web address] Date: 01/23/2023 Prepared by: Fannie Knee  01/16/23: Access Code: [email or web address] URL: [email or web address] Date: 01/16/2023 Prepared by: [facility] - Outpatient  Rehab - [facility]  01/09/23: Access Code: [email or web address] URL: [email or web address] Date: 01/09/2023 Prepared by: [facility] - Outpatient  Rehab - [facility]   GOALS: Goals reviewed with patient? Yes  SHORT TERM GOALS: Target date: 01/04/23  Pt will be independent with coordination and ROM HEP. Baseline: Goal status: MET - 12/31/22  2.  Pt will demonstrate increased coordination to be able to place all 9 pegs into peg board during 9 hole peg test in 2 min time limit. Baseline: able to place 5 in 2 min time limit Goal status: MET - 1:06.84 on 12/31/22  3.  Pt will demonstrate increased L shoulder ROM to allow for ability to retreive light weight object from shoulder height cabinet. Baseline: 88* shoulder flexion with abduction Goal status: IN PROGRESS  4.  Pt will demonstrate improved pinch and coordination as needed to complete clothing fasteners (buttons, tying shoes, etc). Baseline:  Goal status: MET - 01/02/23   LONG TERM GOALS:  Target date: 03/14/23  Pt will be independent with advanced HEP. Baseline:  Goal status: IN PROGRESS  2.  Pt will demonstrate improved fine motor coordination for ADLs as evidenced by being able to complete 9 hole peg test in 45 seconds or less Baseline: 1:06.84 on 12/31/22 Goal status: MET - 39.63 sec on 03/04/23  3.  Pt will demonstrate increased L shoulder ROM and strength to tolerate holding phone to ear for 5 mins phone conversation. Baseline:  Goal status: IN PROGRESS    4.  Pt will report improved LUE functional use as evidenced by increased score on UEFS to > 70%. Baseline: 57.5% on 01/21/23 Goal  status: IN PROGRESS  5. Pt will report improved shoulder ROM and strength to be able to tie a neck tie.  Baseline:  Goal status: IN PROGRESS  6.  Pt will demonstrate improved sustained grasp in L hand to carry a coffee cup 50' without spilling.  Baseline:  Goal status: IN PROGRESS  7.  Pt will demonstrate and/or report improved coordination to demonstrate improved ease/speed of typing by increased WPM >/= to 10 WPM  Baseline: 22  Goal status: IN PROGRESS    ASSESSMENT:  CLINICAL IMPRESSION: Pt is demonstrating good compensatory strategies to compensate for decreased shoulder ROM, reviewed additional strategies to increase success with opening food containers and to drink out of cup in L hand. Pt continues to demonstrate decreased pinch and grip strength, but able to open all containers provided during today's session.  Discussed limited improvements from a measurement standpoint and limitations due to area of injury/surgery.  Plan to f/u with PM&R mid July and discussed possible hold on therapy at end of cert.     PLAN:  OT FREQUENCY: 2x/week  OT DURATION: 6 weeks (plan to only be seen for 4 weeks but asking for 6 due to scheduling conflicts)  PLANNED INTERVENTIONS: self care/ADL training, therapeutic exercise, therapeutic activity, neuromuscular re-education, manual therapy, passive range of motion, balance training, functional mobility training, electrical stimulation, ultrasound, moist heat, cryotherapy, patient/family education, psychosocial skills training, energy conservation, coping strategies training, and DME and/or AE instructions  RECOMMENDED OTHER SERVICES: NA  CONSULTED AND AGREED WITH PLAN OF CARE: Patient  PLAN FOR NEXT SESSION:  Keep emphasizing stretching into internal and external rotation of the shoulder as well as strengthening  the external rotators (teres minor and infraspinatus groups).  Review fine motor skills as needed keep on ulnar nerve gliding.  Need to consolidate HEP into one. Address grip strength with functional reach to open coffee creamer, tie neck tie, etc.  Brielynn Sekula, OTR/L 03/11/2023, 12:51 PM

## 2023-03-13 ENCOUNTER — Other Ambulatory Visit (HOSPITAL_COMMUNITY): Payer: Self-pay

## 2023-03-13 ENCOUNTER — Ambulatory Visit: Payer: Commercial Managed Care - PPO | Admitting: Occupational Therapy

## 2023-03-13 DIAGNOSIS — M6281 Muscle weakness (generalized): Secondary | ICD-10-CM

## 2023-03-13 DIAGNOSIS — M79642 Pain in left hand: Secondary | ICD-10-CM | POA: Diagnosis not present

## 2023-03-13 DIAGNOSIS — M79602 Pain in left arm: Secondary | ICD-10-CM | POA: Diagnosis not present

## 2023-03-13 DIAGNOSIS — R278 Other lack of coordination: Secondary | ICD-10-CM

## 2023-03-13 DIAGNOSIS — R208 Other disturbances of skin sensation: Secondary | ICD-10-CM | POA: Diagnosis not present

## 2023-03-13 MED ORDER — METOPROLOL SUCCINATE ER 25 MG PO TB24
25.0000 mg | ORAL_TABLET | Freq: Every day | ORAL | 3 refills | Status: DC
  Filled 2023-03-13: qty 90, 90d supply, fill #0
  Filled 2023-06-15: qty 30, 30d supply, fill #1

## 2023-03-13 NOTE — Therapy (Addendum)
OUTPATIENT OCCUPATIONAL THERAPY NEURO  Treatment Session & Discharge  Patient Name: Johnny Robinson MRN: [id number] DOB:[birth year], 57 y.o., male Today's Date: 03/13/2023  PCP: Marguarite Arbour, MD REFERRING PROVIDER: Jacquelynn Cree, PA-C    END OF SESSION:  OT End of Session - 03/13/23 1315     Visit Number 22    Number of Visits 22    Date for OT Re-Evaluation 03/14/23    Authorization Type [employer] employee - [employer] P[employer]    OT Start Time 1151    OT Stop Time 1234    OT Time Calculation (min) 43 min    Activity Tolerance Patient tolerated treatment well;No increased pain;Patient limited by fatigue    Behavior During Therapy [facility] for tasks assessed/performed                      Past Medical History:  Diagnosis Date   Arthritis    Chronic kidney disease 2016   RENAL INFARCT   GERD (gastroesophageal reflux disease)    Hypertension    Reflux    Subdural hematoma (HCC) 1991   Past Surgical History:  Procedure Laterality Date   BACK SURGERY  11/2017   burr hole  1992   to evacuate SDH   COLONOSCOPY WITH PROPOFOL N/A 09/02/2018   Procedure: COLONOSCOPY WITH PROPOFOL;  Surgeon: Midge Minium, MD;  Location: [facility] ENDOSCOPY;  Service: Endoscopy;  Laterality: N/A;   EXCISION MASS ABDOMINAL Left 05/28/2018   Procedure: EXCISIONAL BIOPSY MASS ABDOMINAL WALL;  Surgeon: Carolan Shiver, MD;  Location: [facility] ORS;  Service: General;  Laterality: Left;   PERIPHERAL VASCULAR CATHETERIZATION Right 05/18/2015   Procedure: Renal Angiography;  Surgeon: Annice Needy, MD;  Location: [facility] INVASIVE CV LAB;  Service: Cardiovascular;  Laterality: Right;   PERIPHERAL VASCULAR CATHETERIZATION Bilateral 05/18/2015   Procedure: Renal Intervention;  Surgeon: Annice Needy, MD;  Location: [facility] INVASIVE CV LAB;  Service: Cardiovascular;  Laterality: Bilateral;   TOTAL HIP ARTHROPLASTY Left 03/07/2022   Procedure: TOTAL HIP ARTHROPLASTY ANTERIOR APPROACH;  Surgeon: Ollen Gross, MD;   Location: [facility] ORS;  Service: Orthopedics;  Laterality: Left;   Patient Active Problem List   Diagnosis Date Noted   Nerve pain 12/31/2022   Left shoulder pain 11/30/2022   Urinary hesitancy 11/30/2022   Constipation 11/30/2022   Biceps tendinitis of left shoulder 11/30/2022   Multifocal pneumonia 11/30/2022   Chronic pain syndrome 11/21/2022   HNP (herniated nucleus pulposus) with myelopathy, cervical 11/15/2022   Cervical myelopathy (HCC) 11/13/2022   OA (osteoarthritis) of hip 03/07/2022   Primary osteoarthritis of left hip 03/07/2022   GERD (gastroesophageal reflux disease) 11/27/2021   Encounter for screening colonoscopy    Benign neoplasm of rectosigmoid junction    Hyperlipidemia 12/18/2017   Essential hypertension 12/17/2016   Chest pain 05/30/2015   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    ONSET DATE: 11/13/22  REFERRING DIAG: G95.9 (ICD-10-CM) - Cervical myelopathy  THERAPY DIAG:  Muscle weakness (generalized)  Other lack of coordination  Pain in left arm  Other disturbances of skin sensation  Pain in left hand  Rationale for Evaluation and Treatment: Rehabilitation  PERTINENT HISTORY: 57 year old R handed male with 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 Dr. Jordan Likes expressed concerns of cord contusion due to worsening of myelopathy and MRI spine done which showed post op changes form ACDF C3-C6 and patchy signal abnormality involving the central and left cord at C5/6 concerning for contusion/injury.  PRECAUTIONS: Cervical and Other: lifting precautions not > 5#  WEIGHT BEARING RESTRICTIONS: No  SUBJECTIVE:   SUBJECTIVE  STATEMENT: Pt reports carrying around a stress ball and rubber band in his pocket to use during court.  Pt did report tilting creamer bottle with improved ease with opening it. Pt accompanied by: self   PAIN:  Are you having pain?  Not at rest today  FALLS: Has patient fallen in last 6 months? No  LIVING ENVIRONMENT: Lives with: lives with their spouse and 2 yo Lives in: House/apartment Stairs: Yes: Internal: full flight steps; on right going up and External: 4 steps; none Has following equipment at home: Quad cane small base, Walker - 2 wheeled, shower chair, bed side commode, and hand held shower head  PLOF: Independent, Independent with basic ADLs, and Vocation/Vocational requirements: practices law  PATIENT GOALS: pt would like to get arm progressing like leg    OBJECTIVE: (All objective assessments below are from initial evaluation on: 12/06/22 unless otherwise specified.)   HAND DOMINANCE: Right  ADLs: Overall ADLs: requires increased time and thought to complete Transfers/ambulation related to ADLs: Mod I with SBQC Eating: requires assistance to cut food Grooming: Mod I UB Dressing: requiring assistance with buttons LB Dressing: requiring assistance with socks/tying shoes Toileting: Mod I Bathing: Mod I Tub Shower transfers: Mod I, has built in shower seat Equipment: Walk in shower  IADLs: Light housekeeping: has attempted vacuuming without issue Meal Prep: has attempted cooking, spouse or children assisting with retrieving items if >5# Medication management: spouse is filling pillbox and is reminding him to take  MOBILITY STATUS: Needs Assist: Mod I - Supervision with SBQC  POSTURE COMMENTS:  rounded shoulders and forward head  FUNCTIONAL OUTCOME MEASURES: Upper Extremity Functional Scale (UEFS): 33/80 =  41.25% function  UPPER EXTREMITY ROM:    Active ROM Left eval Left 01/16/23 Left 03/04/23  Shoulder flexion 88 (requiring abduction to achieve) 110 110   Shoulder abduction 85 90 90  Shoulder adduction     Shoulder extension     Shoulder internal rotation 90% WNL 90%  Shoulder external rotation 30% 30% 30%  Elbow flexion 100 WNL   Elbow extension WNL WNL   Wrist flexion WNL WNL   Wrist extension To neutral 10* 40  Wrist ulnar deviation trace 20* 20  Wrist radial deviation trace 18* 25  Wrist pronation     Wrist supination To neutral To neutral (without bracing, does have full range when bracing at elbow) To neutral (without bracing, does have full range when bracing at elbow)  (Blank rows = not tested)  UPPER EXTREMITY MMT:     MMT Left eval Left 01/16/23 Left 03/04/23  Shoulder flexion 2-/5 3-/5   Shoulder abduction     Shoulder adduction     Shoulder extension     Shoulder internal rotation     Shoulder external rotation     Middle trapezius     Lower trapezius     Elbow flexion 4-/5    Elbow extension 4-/5    Wrist flexion 3-/5 3/5   Wrist extension 3-/5 3/5   Wrist ulnar deviation     Wrist radial deviation     Wrist pronation  Wrist supination     (Blank rows = not tested)  HAND FUNCTION: 03/04/23: Grip strength: R: 75# and L: 12#  COORDINATION: Finger Nose Finger test: able to reach chin to finger with LUE, decreased shoulder ROM limiting movement 9 Hole Peg test: Right: 23.03 sec; Left: able to place 5 pegs in 2 min time limit (pt needing to use side of container/multiple pegs to leverage to pick up one peg, also with difficulty with manipulation/translation in finger tips to orient to place in peg hole) 03/04/23: L: 39.63 sec Box and Blocks:  Right 44 blocks, Left 32 blocks  SENSATION: Reports duller sensation on L compared to R  COGNITION: Overall cognitive status: Within functional limits for tasks assessed  VISION: Subjective report: no changes Baseline vision: Wears glasses for reading only   TODAY'S TREATMENT:                                                                                        03/13/23 NMR: engaged in ulnar nerve flossing, with modification to attempt in prayer stretch and the "floss" from R to L to facilitate increased movement, challenging pt to increase height of hands to challenge more ulnar facilitation.  Therex: engaged in shoulder flexion and abduction with large therapy ball with focus on forward lean to facilitate increased ROM/stretch.  Pt tolerating increased stretch in both directions.  Reorganized and reviewed HEP to facilitate increased carryover and fluidity of exercises.  Encouraged pt to continue on with HEP as pt recognizing ongoing slow progressions. Typing: pt completing 1 min typing program with min improvements noted up from 22 WPM to 24 WPM.  Pt reports that he is not much of a typist and frequently uses voice to text programs.   UEFS:  49/80 = 61.25%.  Pt reporting improvements overall with many of the activities, however also reports increased awareness of deficits and may have been more optimistic on some activities during previous assessment.  Pt also reports increasing participation in tasks than previously and therefore noticing difficulties as pt increases engagement in tasks.    03/11/23 Resistance Clothespins: 4,6,8# with LUE for mid functional reaching and sustained pinch to place on horizontal dowel at chest height.  Pt able to complete with good effort with 4,6# clothespins, but only able to complete x1 with 8# clothespin.    Functional grasp: engaged in opening of various containers, screw/unscrew nuts and bolts, and pouring cup.  Pt utilizing BUE to open pill bottle and jar lid, attempting to focus on stabilization with RUE to facilitate increased functional grasp/use with LUE.  Pt reports difficulty opening coffee creamer lid with L hand.  Discussed modifications and cues to attempt with hand placement.  Attempted bringing cup to mouth with pt continuing to get "stuck" at chest height with inability to rotate to reach towards mouth with both  empty and half full cup.  OT recommended use of straw to accommodate decreased shoulder flexion and internal rotation to reach to mouth. Nerve glides: Attempted ulnar nerve flossing, however pt able to achieve position but unable to move arm once in 90* abduction.  Pt able to complete movement forward, but unable  to complete per instructions due to decreased tricep activation.  Reiterated modified tricep stretch at table top with pt completing with reports of good stretch.    03/04/23 Reviewed previous exercises to further facilitate shoulder flexion and abduction to aid in tying necktie and holding phone to L ear.  Engaged in child's pose tricep stretch on table top as well as wall slides while addressing sustained stretch within pain tolerance.  OT provided demonstration on open book stretch with LUE stabilized on wall to facilitate increased abduction and external rotation.  Pt demonstrating improved chest opening with UE stabilized on wall with more dynamic movement when compared to attempts to complete with bracing at doorframe. Coordination: Pt completed 9 hole peg test in 39 seconds with L hand, demonstrating continued improvements in [facility]. Grip strength: pt continues to demonstrate significantly impaired grip strength in L hand, largely due to decreased flexion of 4th and 5th digits.  Pt able to achieve 10-12# with thumb and first 2 fingers and with attempts at full fist.  Reiterated use of theraputty for grip strength as well as use of rubber band for finger extension.    PATIENT EDUCATION: Educion details: ongoing condition specific education and UE ROM and strengatthening exercises Person educated: Patient Education method: Explanation, Demonstration, Verbal cues, and Handouts Education comprehension: verbalized understanding and needs further education  HOME EXERCISE PROGRAM: Access Code: [email or web address] URL: [email or web address] Date: 01/31/2023 Prepared by: [facility] - Outpatient   Rehab - [facility]   GOALS: Goals reviewed with patient? Yes  SHORT TERM GOALS: Target date: 01/04/23  Pt will be independent with coordination and ROM HEP. Baseline: Goal status: MET - 12/31/22  2.  Pt will demonstrate increased coordination to be able to place all 9 pegs into peg board during 9 hole peg test in 2 min time limit. Baseline: able to place 5 in 2 min time limit Goal status: MET - 1:06.84 on 12/31/22  3.  Pt will demonstrate increased L shoulder ROM to allow for ability to retreive light weight object from shoulder height cabinet. Baseline: 88* shoulder flexion with abduction Goal status: IN PROGRESS  4.  Pt will demonstrate improved pinch and coordination as needed to complete clothing fasteners (buttons, tying shoes, etc). Baseline:  Goal status: MET - 01/02/23   LONG TERM GOALS:  Target date: 03/14/23  Pt will be independent with advanced HEP. Baseline:  Goal status: MET - 03/13/23  2.  Pt will demonstrate improved fine motor coordination for ADLs as evidenced by being able to complete 9 hole peg test in 45 seconds or less Baseline: 1:06.84 on 12/31/22 Goal status: MET - 39.63 sec on 03/04/23  3.  Pt will demonstrate increased L shoulder ROM and strength to tolerate holding phone to ear for 5 mins phone conversation. Baseline:  Goal status: Not met - however pt utilizing speaker phone on personal phone and/or headset for work phone calls  4.  Pt will report improved LUE functional use as evidenced by increased score on UEFS to > 70%. Baseline: 57.5% on 01/21/23 Goal status: Not met - 61.25% on 03/13/23  5. Pt will report improved shoulder ROM and strength to be able to tie a neck tie.  Baseline:  Goal status: MET - 03/13/23  6.  Pt will demonstrate improved sustained grasp in L hand to carry a coffee cup 50' without spilling.  Baseline:  Goal status: MET - 03/13/23  7.  Pt will demonstrate and/or report improved coordination to demonstrate improved  ease/speed of  typing by increased WPM >/= to 10 WPM  Baseline: 22  Goal status: Not met - 24 WPM on 03/13/23    ASSESSMENT:  CLINICAL IMPRESSION: Pt is demonstrating good compensatory strategies to compensate for decreased shoulder ROM with ADLs, work tasks, and IADLs.  Pt reports utilizing modified positioning of coffee creamer with improved ability to open cap since Monday's session.  Pt continues to demonstrate decreased ROM and tightness especially movements requiring tricep innervation.  Pt pleased with current status, expressing desire to continue to regain function but understanding need to continue with HEP (ROM and strengthening).  At this time, skilled occupational therapy services are no longer indicated. Pt requests to defer?for f/u with  surgeon and PM&R MD and attempt HEP on own.  Pt encouraged to call back with any changes or if any relevant functional deficits develop/occur. Pt to be discharged from OP OT if no further therapy is warranted.  Pt in agreement with plan to take a break and f/u if needs arise.  PLAN:  OT FREQUENCY: 2x/week  OT DURATION: 6 weeks (plan to only be seen for 4 weeks but asking for 6 due to scheduling conflicts)  PLANNED INTERVENTIONS: self care/ADL training, therapeutic exercise, therapeutic activity, neuromuscular re-education, manual therapy, passive range of motion, balance training, functional mobility training, electrical stimulation, ultrasound, moist heat, cryotherapy, patient/family education, psychosocial skills training, energy conservation, coping strategies training, and DME and/or AE instructions  RECOMMENDED OTHER SERVICES: NA  CONSULTED AND AGREED WITH PLAN OF CARE: Patient  PLAN FOR NEXT SESSION:  Reassess if pt returns for further sessions.  Rosalio Loud, OTR/L 03/13/2023, 1:17 PM   OCCUPATIONAL THERAPY DISCHARGE SUMMARY  Visits from Start of Care: 22  Current functional level related to goals / functional outcomes: Pt did not  return or call to set up additional appointments.  Pt met 4/7 goals and partially met (utilizing adaptations) remaining LTGs.  Per note from 03/13/23 - Pt is demonstrating good compensatory strategies to compensate for decreased shoulder ROM with ADLs, work tasks, and IADLs.  Pt reports utilizing modified positioning of coffee creamer with improved ability to open cap since Monday's session.  Pt pleased with current status, expressing desire to continue to regain function but understanding need to continue with HEP (ROM and strengthening).  At this time, skilled occupational therapy services are no longer indicated. Pt requests to defer?for f/u with  surgeon and PM&R MD and attempt HEP on own.  Pt encouraged to call back with any changes or if any relevant functional deficits develop/occur. Pt to be discharged from OP OT if no further therapy is warranted.  Pt in agreement with plan to take a break and f/u if needs arise.   Remaining deficits: Pt continues to demonstrate decreased ROM and tightness especially movements requiring tricep innervation.    Education / Equipment: ROM and strengthening HEP, AE and adaptive techniques to increase ease/independence  Patient agrees to discharge. Patient goals were partially met. Patient is being discharged due to being pleased with the current functional level.Marland Kitchen  Rosalio Loud, OT 07/30/23

## 2023-03-14 ENCOUNTER — Other Ambulatory Visit (HOSPITAL_COMMUNITY): Payer: Self-pay

## 2023-03-14 ENCOUNTER — Other Ambulatory Visit: Payer: Self-pay

## 2023-03-14 DIAGNOSIS — K219 Gastro-esophageal reflux disease without esophagitis: Secondary | ICD-10-CM | POA: Diagnosis not present

## 2023-03-14 DIAGNOSIS — I10 Essential (primary) hypertension: Secondary | ICD-10-CM | POA: Diagnosis not present

## 2023-03-14 DIAGNOSIS — R7309 Other abnormal glucose: Secondary | ICD-10-CM | POA: Diagnosis not present

## 2023-03-14 DIAGNOSIS — E782 Mixed hyperlipidemia: Secondary | ICD-10-CM | POA: Diagnosis not present

## 2023-03-14 DIAGNOSIS — Z79899 Other long term (current) drug therapy: Secondary | ICD-10-CM | POA: Diagnosis not present

## 2023-03-14 MED ORDER — METOPROLOL SUCCINATE ER 25 MG PO TB24
25.0000 mg | ORAL_TABLET | Freq: Every day | ORAL | 3 refills | Status: DC
  Filled 2023-03-14: qty 90, 90d supply, fill #0
  Filled 2023-08-07: qty 30, 30d supply, fill #0
  Filled 2023-09-09: qty 30, 30d supply, fill #1
  Filled 2023-10-09: qty 30, 30d supply, fill #2
  Filled 2023-11-11: qty 30, 30d supply, fill #3
  Filled 2023-12-17: qty 30, 30d supply, fill #4
  Filled 2024-01-13: qty 30, 30d supply, fill #5
  Filled 2024-02-12: qty 30, 30d supply, fill #6

## 2023-03-15 ENCOUNTER — Other Ambulatory Visit (HOSPITAL_COMMUNITY): Payer: Self-pay

## 2023-03-18 ENCOUNTER — Encounter: Payer: Commercial Managed Care - PPO | Admitting: Occupational Therapy

## 2023-04-01 ENCOUNTER — Encounter: Payer: 59 | Attending: Physical Medicine and Rehabilitation | Admitting: Physical Medicine and Rehabilitation

## 2023-04-01 ENCOUNTER — Encounter: Payer: Self-pay | Admitting: Physical Medicine and Rehabilitation

## 2023-04-01 ENCOUNTER — Other Ambulatory Visit (HOSPITAL_COMMUNITY): Payer: Self-pay

## 2023-04-01 VITALS — BP 122/78 | HR 73 | Ht 73.0 in | Wt 190.0 lb

## 2023-04-01 DIAGNOSIS — R1310 Dysphagia, unspecified: Secondary | ICD-10-CM | POA: Diagnosis present

## 2023-04-01 DIAGNOSIS — R252 Cramp and spasm: Secondary | ICD-10-CM | POA: Insufficient documentation

## 2023-04-01 DIAGNOSIS — G959 Disease of spinal cord, unspecified: Secondary | ICD-10-CM | POA: Diagnosis present

## 2023-04-01 MED ORDER — BACLOFEN 5 MG PO TABS
5.0000 mg | ORAL_TABLET | Freq: Three times a day (TID) | ORAL | 5 refills | Status: DC | PRN
  Filled 2023-04-01: qty 60, 20d supply, fill #0
  Filled 2023-05-18: qty 60, 20d supply, fill #1

## 2023-04-01 NOTE — Progress Notes (Signed)
Subjective:    Patient ID: Johnny Robinson, male    DOB: [birth year], 57 y.o.   MRN: [id number]  HPI  Pt is a 57 year old male with Cervical myelopathy and L sided weakness s/p C3-6 ACDF; myofascial pain s/p trigger point injections, L shoulder pain- prior multifocal pneumonia-  Here for  f/u on Cervical myelopathy with LUE weakness and L shoulder pain.     LUE weakness- still pretty weak- pain is more dull now-  Off opiates for 2-3 months- which is great.   Stopped PT; then stopped OT in June 2024-  Due to lack of progress.   Wasn't progressively better, just plateau'd.  Did esitm, etc, and exercises.  Hasn't used [employer] lately.   Certain movements- just couldn't do-     Has noticed weird lines on fingernails- on L hand only.   L hand has gotten "more stiff" he thinks, but in beginning couldn't move it so hard to know.  Mainly in triceps, in LUE And shoulder maybe a little tight.   Not enough wants to take medicine for it.   When stretches, LUE goes crazy.   Notices when LLE weak when gets fatigued.    Had barium swallow- got a pill stuck- peanut butter gets stuck most.    Pain Inventory Average Pain 2 Pain Right Now 2 My pain is constant and dull  In the last 24 hours, has pain interfered with the following? General activity 1 Relation with others 0 Enjoyment of life 3 What TIME of day is your pain at its worst? night Sleep (in general) Fair  Pain is worse with: some activites Pain improves with: rest Relief from Meds: 4  Family History  Problem Relation Age of Onset   Nephrolithiasis Mother    Cancer Father    Social History   Socioeconomic History   Marital status: Married    Spouse name: Carollee Herter   Number of children: 3   Years of education: JD   Highest education level: Professional school degree (e.g., MD, DDS, DVM, JD)  Occupational History   Occupation: Clinical research associate  Tobacco Use   Smoking status: Never   Smokeless tobacco: Never  Vaping Use    Vaping status: Never Used  Substance and Sexual Activity   Alcohol use: Yes    Comment: occasional   Drug use: No   Sexual activity: Yes    Birth control/protection: None  Other Topics Concern   Not on file  Social History Narrative   Not on file   Social Determinants of Health   Financial Resource Strain: Not on file  Food Insecurity: No Food Insecurity (11/13/2022)   Hunger Vital Sign    Worried About Running Out of Food in the Last Year: Never true    Ran Out of Food in the Last Year: Never true  Transportation Needs: No Transportation Needs (11/13/2022)   PRAPARE - Administrator, Civil Service (Medical): No    Lack of Transportation (Non-Medical): No  Physical Activity: Not on file  Stress: Not on file  Social Connections: Not on file   Past Surgical History:  Procedure Laterality Date   BACK SURGERY  11/2017   burr hole  1992   to evacuate SDH   COLONOSCOPY WITH PROPOFOL N/A 09/02/2018   Procedure: COLONOSCOPY WITH PROPOFOL;  Surgeon: Midge Minium, MD;  Location[facility] ENDOSCOPY;  Service: Endoscopy;  Laterality: N/A;   EXCISION MASS ABDOMINAL Left 05/28/2018   Procedure: EXCISIONAL BIOPSY MASS  ABDOMINAL WALL;  Surgeon: Carolan Shiver, MD;  Location: [facility] ORS;  Service: General;  Laterality: Left;   PERIPHERAL VASCULAR CATHETERIZATION Right 05/18/2015   Procedure: Renal Angiography;  Surgeon: Annice Needy, MD;  Location: [facility] INVASIVE CV LAB;  Service: Cardiovascular;  Laterality: Right;   PERIPHERAL VASCULAR CATHETERIZATION Bilateral 05/18/2015   Procedure: Renal Intervention;  Surgeon: Annice Needy, MD;  Location: [facility] INVASIVE CV LAB;  Service: Cardiovascular;  Laterality: Bilateral;   TOTAL HIP ARTHROPLASTY Left 03/07/2022   Procedure: TOTAL HIP ARTHROPLASTY ANTERIOR APPROACH;  Surgeon: Ollen Gross, MD;  Location: [facility] ORS;  Service: Orthopedics;  Laterality: Left;   Past Surgical History:  Procedure Laterality Date   BACK SURGERY  11/2017    burr hole  1992   to evacuate SDH   COLONOSCOPY WITH PROPOFOL N/A 09/02/2018   Procedure: COLONOSCOPY WITH PROPOFOL;  Surgeon: Midge Minium, MD;  Location: [facility] ENDOSCOPY;  Service: Endoscopy;  Laterality: N/A;   EXCISION MASS ABDOMINAL Left 05/28/2018   Procedure: EXCISIONAL BIOPSY MASS ABDOMINAL WALL;  Surgeon: Carolan Shiver, MD;  Location: [facility] ORS;  Service: General;  Laterality: Left;   PERIPHERAL VASCULAR CATHETERIZATION Right 05/18/2015   Procedure: Renal Angiography;  Surgeon: Annice Needy, MD;  Location: [facility] INVASIVE CV LAB;  Service: Cardiovascular;  Laterality: Right;   PERIPHERAL VASCULAR CATHETERIZATION Bilateral 05/18/2015   Procedure: Renal Intervention;  Surgeon: Annice Needy, MD;  Location: [facility] INVASIVE CV LAB;  Service: Cardiovascular;  Laterality: Bilateral;   TOTAL HIP ARTHROPLASTY Left 03/07/2022   Procedure: TOTAL HIP ARTHROPLASTY ANTERIOR APPROACH;  Surgeon: Ollen Gross, MD;  Location: [facility] ORS;  Service: Orthopedics;  Laterality: Left;   Past Medical History:  Diagnosis Date   Arthritis    Chronic kidney disease 2016   RENAL INFARCT   GERD (gastroesophageal reflux disease)    Hypertension    Reflux    Subdural hematoma (HCC) 1991   There were no vitals taken for this visit.  Opioid Risk Score:   Fall Risk Score:  `1  Depression screen [facility] 2/9     12/31/2022   10:43 AM  Depression screen PHQ 2/9  Decreased Interest 0  Down, Depressed, Hopeless 0  PHQ - 2 Score 0  Altered sleeping 0  Tired, decreased energy 0  Change in appetite 0  Feeling bad or failure about yourself  0  Trouble concentrating 0  Moving slowly or fidgety/restless 0  Suicidal thoughts 0  PHQ-9 Score 0      Review of Systems  Musculoskeletal:        LT shoulder  All other systems reviewed and are negative.      Objective:   Physical Exam Awake, alert, appropriate, accompanied by wife; dressed for work; NAD  Neuro:  Slightly increased DTR"s in LUE-  brachioradialis and biceps as well as triceps-  1+ in RUE Hoffman's LUE very brisk Slightly increased tone in LUE- esp in elbow extension and flexion-  No wrist clonus but feels like might be in future?  MS:  LUE-  deltoids 4+/5; biceps  5-/5; Triceps 4+/5; WE 4+/5; grip 4/5; and FA 4 to 4+/5 RUE- 5/5 LLE- HF  4+/5; KE/KF 5-/5; DF 5-/5 and PF 5-/5   Cannot get arm to head without assistance-  Cannot open a soda bottle      Assessment & Plan:   Pt is a 57 year old male with Cervical myelopathy and L sided weakness s/p C3-6 ACDF; myofascial pain s/p trigger point injections, L shoulder pain- prior  multifocal pneumonia-  Here for  f/u on Cervical myelopathy with LUE weakness and L shoulder pain.    Spasticity can get  progressively worse to 1-2 years.  Can progress- so need to do therapy/ROM to help treat- let's wait on meds.   2.  Baclofen- as needed-    up to 3x/day - sedation and constipation are really only side effect- for spasticity-   3. Educated on ROM- need to do 3x/day-   4.  Be careful when walks a far distance- take 5-10 minutes breaks wen necessary.    5. Went over prognosis- and will take ~ 1 year to hit maximal improvement in strength- new skills go on forever, strength hit maximal improvement at the 1 year mark.    6.  Think about therapy without walls- at next appointment.   7. Sees Dr Jordan Likes 8/1- and Dr Katrinka Blazing 8/6 in next 1-2 months.   8. F/U in 3 months- call me if any issues.   9. SLP-  has difficulty swallowing every day- more related to food-   I spent a total of  43   minutes on total care today- >50% coordination of care- due to  D/w about dysphagia, spasticity and more therapy.

## 2023-04-01 NOTE — Patient Instructions (Addendum)
Pt is a 57 year old male with Cervical myelopathy and L sided weakness s/p C3-6 ACDF; myofascial pain s/p trigger point injections, L shoulder pain- prior multifocal pneumonia-  Here for  f/u on Cervical myelopathy with LUE weakness and L shoulder pain.    Spasticity can get  progressively worse to 1-2 years.  Can progress- so need to do therapy/ROM to help treat- let's wait on meds.   2.  Baclofen- as needed-    up to 3x/day - sedation and constipation are really only side effect- for spasticity-   3. Educated on ROM- need to do 3x/day-   4.  Be careful when walks a far distance- take 5-10 minutes breaks wen necessary.    5. Went over prognosis- and will take ~ 1 year to hit maximal improvement in strength- new skills go on forever, strength hit maximal improvement at the 1 year mark.    6.  Think about therapy without walls- at next appointment.   7. Sees Dr Jordan Likes 8/1- and Dr Katrinka Blazing 8/6 in next 1-2 months.   8. F/U in 3 months- call me if any issues.   9. Speech therapy eval for swallowing- issues-

## 2023-04-03 ENCOUNTER — Other Ambulatory Visit (HOSPITAL_COMMUNITY): Payer: Self-pay

## 2023-04-03 ENCOUNTER — Other Ambulatory Visit: Payer: Self-pay

## 2023-04-11 ENCOUNTER — Ambulatory Visit: Payer: 59 | Attending: Physical Medicine and Rehabilitation

## 2023-04-11 ENCOUNTER — Other Ambulatory Visit: Payer: Self-pay

## 2023-04-11 DIAGNOSIS — R1310 Dysphagia, unspecified: Secondary | ICD-10-CM | POA: Insufficient documentation

## 2023-04-11 DIAGNOSIS — G959 Disease of spinal cord, unspecified: Secondary | ICD-10-CM | POA: Diagnosis not present

## 2023-04-11 NOTE — Therapy (Signed)
OUTPATIENT SPEECH LANGUAGE PATHOLOGY SWALLOW EVALUATION   Patient Name: Johnny Robinson MRN: [id number] DOB:1966-06-01, 57 y.o., male Today's Date: 04/11/2023  PCP: Aram Beecham, MD REFERRING PROVIDER: Genice Rouge, MD  END OF SESSION:  End of Session - 04/11/23 2153     Visit Number 1    Number of Visits 8    Date for SLP Re-Evaluation 06/10/23    SLP Start Time 1623    SLP Stop Time  1704    SLP Time Calculation (min) 41 min    Activity Tolerance Patient tolerated treatment well             Past Medical History:  Diagnosis Date   Arthritis    Chronic kidney disease 2016   RENAL INFARCT   GERD (gastroesophageal reflux disease)    Hypertension    Reflux    Subdural hematoma (HCC) 1991   Past Surgical History:  Procedure Laterality Date   BACK SURGERY  11/2017   burr hole  1992   to evacuate SDH   COLONOSCOPY WITH PROPOFOL N/A 09/02/2018   Procedure: COLONOSCOPY WITH PROPOFOL;  Surgeon: Midge Minium, MD;  Location: [facility] ENDOSCOPY;  Service: Endoscopy;  Laterality: N/A;   EXCISION MASS ABDOMINAL Left 05/28/2018   Procedure: EXCISIONAL BIOPSY MASS ABDOMINAL WALL;  Surgeon: Carolan Shiver, MD;  Location: [facility] ORS;  Service: General;  Laterality: Left;   PERIPHERAL VASCULAR CATHETERIZATION Right 05/18/2015   Procedure: Renal Angiography;  Surgeon: Annice Needy, MD;  Location: [facility] INVASIVE CV LAB;  Service: Cardiovascular;  Laterality: Right;   PERIPHERAL VASCULAR CATHETERIZATION Bilateral 05/18/2015   Procedure: Renal Intervention;  Surgeon: Annice Needy, MD;  Location: [facility] INVASIVE CV LAB;  Service: Cardiovascular;  Laterality: Bilateral;   TOTAL HIP ARTHROPLASTY Left 03/07/2022   Procedure: TOTAL HIP ARTHROPLASTY ANTERIOR APPROACH;  Surgeon: Ollen Gross, MD;  Location: [facility] ORS;  Service: Orthopedics;  Laterality: Left;   Patient Active Problem List   Diagnosis Date Noted   Spasticity 04/01/2023   Nerve pain 12/31/2022   Left shoulder pain 11/30/2022    Urinary hesitancy 11/30/2022   Constipation 11/30/2022   Biceps tendinitis of left shoulder 11/30/2022   Multifocal pneumonia 11/30/2022   Chronic pain syndrome 11/21/2022   HNP (herniated nucleus pulposus) with myelopathy, cervical 11/15/2022   Cervical myelopathy (HCC) 11/13/2022   OA (osteoarthritis) of hip 03/07/2022   Primary osteoarthritis of left hip 03/07/2022   GERD (gastroesophageal reflux disease) 11/27/2021   Encounter for screening colonoscopy    Benign neoplasm of rectosigmoid junction    Hyperlipidemia 12/18/2017   Essential hypertension 12/17/2016   Chest pain 05/30/2015   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    ONSET DATE: February 2024   REFERRING DIAG: R13.10 (ICD-10-CM) - Dysphagia, unspecified type  THERAPY DIAG:  Dysphagia, unspecified type  Rationale for Evaluation and Treatment: Rehabilitation  SUBJECTIVE:   SUBJECTIVE STATEMENT: "When I have peanut butter crackers at work they seem to get stuck right here." (Points to thyroid area) Pt accompanied by: self  PERTINENT HISTORY: 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 Dr. Jordan Likes expressed concerns of cord contusion due to worsening of myelopathy and MRI spine done which showed post op changes form ACDF C3-C6  and patchy signal abnormality involving the central and left cord at C5/6 concerning for contusion/injury.  PAIN:  Are you having pain? No  FALLS: Has patient fallen in last 6 months?  See PT evaluation for details  LIVING ENVIRONMENT: Lives with: lives with their spouse Lives in: House/apartment  PLOF:  Level of assistance: Independent with ADLs, Independent  with IADLs Employment: Full-time employment  PATIENT GOALS: Inquire whether something is wrong with swallowing, after learning SLP treats swallowing.  OBJECTIVE:   DIAGNOSTIC FINDINGS:  02/13/23- ESOPHAGUS/BARIUM SWALLOW/TABLET STUDY TECHNIQUE: Combined double and single contrast examination was performed using effervescent crystals, high-density barium, and thin liquid barium. This exam was performed by Alwyn Ren, NP, and was supervised and interpreted by Dr. Ernestina Penna. FLUOROSCOPY: Radiation Exposure Index (as provided by the fluoroscopic device): 23.40 mGy reference air Kerma Fluoroscopy time: 3.1 minutes COMPARISON:  None Available FINDINGS: Swallowing: Appears normal. No vestibular penetration or aspiration seen. Pharynx: Unremarkable. Esophagus: Normal appearance. Esophageal motility: Within normal limits. Hiatal Hernia: Small sliding-type hiatal hernia. Gastroesophageal reflux: None visualized. Ingested 13mm barium tablet: Became stuck mid esophagus but passed with subsequent barium administration. Other: None. IMPRESSION: No esophageal mass or stricture.  Small sliding-type hiatal hernia.  11/13/22 cervical MRI:   Postoperative changes from prior ACDF at C3 through C6 without significant residual spinal stenosis.Patchy signal abnormality involving the central and left cord at the level of C5-6, concerning for contusion/injury.   11/23/22 cervical xray:  Prior ACDF from C3-C6. Intact hardware without evidence of loosening. Persistent prevertebral soft tissue swelling.Mild degenerative disc disease at C6-C7. Mild to moderate multilevel facet arthropathy.  COGNITION: Overall cognitive status: Within functional limits for tasks assessed  ORAL MOTOR EXAMINATION: Overall status: WFL Comments: extremely mild difference in strength with lt lingual musculature compared to rt  CLINICAL SWALLOW ASSESSMENT:   Current diet: regular and thin liquids ; pt limiting bite size  with dense foods and taking smaller sips liquids Dentition: adequate natural dentition Patient directly observed with POs: Yes: dysphagia 3 (soft), dysphagia 1 (puree), and thin liquids  Feeding: able to feed self Liquids provided by: cup Oral phase signs and symptoms:  nothing noted today Pharyngeal phase signs and symptoms: complaints of residue which cleared within 3-4 seconds, with fig brain in 3/4 boluses. With 1/4 boluses pt was instructed to take smaller bite, and reported much less feeling of pharyngeal residue, which cleared more quickly than with a smaller bite. No difference in feeling of residue with head turn lt.   PATIENT REPORTED OUTCOME MEASURES (PROM): EAT-10 if clinically indicated after MBS   TODAY'S TREATMENT:                                                                                                                                         DATE: n/a  PATIENT EDUCATION: Education details: rationale for MBS, typical swallowing issues following ACDF surgery, needs to take smaller bites and  smaller sips Person educated: Patient Education method: Explanation Education comprehension: verbalized understanding and returned demonstration   ASSESSMENT:  CLINICAL IMPRESSION: Patient is a 57 y.o. M who was seen today for bedside swallow assessment due to pt report of pharyngeal residue with heavier and more sticky/creamy foods like peanut butter crackers. Velton tells SLP that he coughs with thin liquids x2/week and has begun to reduce his sip size to compensate for this. This difficulty has occurred since his ACDF sx 11/13/22. Please see "Education" for more details.  OBJECTIVE IMPAIRMENTS: include dysphagia. These impairments are limiting patient from safety when swallowing. Factors affecting potential to achieve goals and functional outcome are  possible structural etiology for dysphagia . Patient will benefit from skilled SLP services to address above impairments and improve  overall function.  REHAB POTENTIAL: Good   GOALS: Goals reviewed with patient? No  SHORT TERM GOALS: Target date: 05/24/23 (pt beginning ST, if indicated, week of 04/29/23)  Pt will follow safe swallow precautions from MBSS with rare min A over two sessions Baseline: Goal status: INITIAL  2.  Pt will complete swallow HEP with rare min A over 3 sessions Baseline:  Goal status: INITIAL  3.  Pt will undergo modified barium swallow exam to ID strategies to improve pt safety with POs and to ID potential for therapeutic intervention Baseline:  Goal status: INITIAL   LONG TERM GOALS: Target date: 06/10/23  Pt will follow safe swallow strategies from MBSS with modified independence over two sessions Baseline:  Goal status: INITIAL  2.  Pt will complete swallow HEP with modified independene over 2 sessions Baseline:  Goal status: INITIAL  3.  Pt will improve score on EAT-10 the last 1-2 ST visits, compared to initial score  Baseline:  Goal status: INITIAL    PLAN:  SLP FREQUENCY: 1-2x/week  SLP DURATION: 8 weeks  PLANNED INTERVENTIONS: Aspiration precaution training, Pharyngeal strengthening exercises, Diet toleration management , Environmental controls, Trials of upgraded texture/liquids, Cueing hierachy, Internal/external aids, SLP instruction and feedback, Compensatory strategies, and Patient/family education    [facility], CCC-SLP 04/11/2023, 9:54 PM

## 2023-04-12 ENCOUNTER — Telehealth (HOSPITAL_COMMUNITY): Payer: Self-pay | Admitting: *Deleted

## 2023-04-12 NOTE — Telephone Encounter (Signed)
Attempted to contact patient to schedule OP MBS. Left VM. RKEEL 

## 2023-04-16 ENCOUNTER — Other Ambulatory Visit (HOSPITAL_COMMUNITY): Payer: Self-pay

## 2023-04-16 ENCOUNTER — Other Ambulatory Visit: Payer: Self-pay

## 2023-04-18 ENCOUNTER — Telehealth (HOSPITAL_COMMUNITY): Payer: Self-pay | Admitting: *Deleted

## 2023-04-18 NOTE — Progress Notes (Unsigned)
[name] [address] [place] [address] Phone: [phone] Subjective:   INadine Counts, am serving as a scribe for Dr. Antoine Primas.  I'm seeing this patient by the request  of:  Marguarite Arbour, MD  CC: neck pain and arm pain   [name]  01/17/2023 Still believe that this is secondary to the injury of the musculature in the area nerve impingement.  Discussed with patient that I do feel that he is making some progress.  Discontinued the Cymbalta secondary to side effects and continue just the gabapentin 300 mg twice a day.  May be able to increase to 300 mg 3 times a day if needed.  Continue to increase activity slowly otherwise.  Follow-up with me again in 3 months otherwise.  Total time with patient discussing different treatment options as well as the prognosis 31 minutes   Patient had the weakness previously but seems to be doing significantly better at the moment.  Still think that this is more neurologic in nature and do feel that the atrophy should improve though.  Increase activity slowly.  Discussed posture and ergonomic exercises.  Patient did have side effects to the Cymbalta so we will discontinue and start gabapentin.  If needed we can increase to 3 times a day.  Follow-up again in 6 to 8 weeks otherwise.      Update 04/23/2023 Johnny Robinson is a 57 y.o. male coming in with complaint of neck and L shoulder pain. Patient states worse since the last visit. Increase in intensity of pain. Nothing is helping. Seen pain management- on bacolofen started PT      Past Medical History:  Diagnosis Date   Arthritis    Chronic kidney disease 2016   RENAL INFARCT   GERD (gastroesophageal reflux disease)    Hypertension    Reflux    Subdural hematoma (HCC) 1991   Past Surgical History:  Procedure Laterality Date   BACK SURGERY  11/2017   burr hole  1992   to evacuate SDH   COLONOSCOPY WITH PROPOFOL N/A 09/02/2018   Procedure:  COLONOSCOPY WITH PROPOFOL;  Surgeon: Midge Minium, MD;  Location: [facility] ENDOSCOPY;  Service: Endoscopy;  Laterality: N/A;   EXCISION MASS ABDOMINAL Left 05/28/2018   Procedure: EXCISIONAL BIOPSY MASS ABDOMINAL WALL;  Surgeon: Carolan Shiver, MD;  Location: [facility] ORS;  Service: General;  Laterality: Left;   PERIPHERAL VASCULAR CATHETERIZATION Right 05/18/2015   Procedure: Renal Angiography;  Surgeon: Annice Needy, MD;  Location: [facility] INVASIVE CV LAB;  Service: Cardiovascular;  Laterality: Right;   PERIPHERAL VASCULAR CATHETERIZATION Bilateral 05/18/2015   Procedure: Renal Intervention;  Surgeon: Annice Needy, MD;  Location: [facility] INVASIVE CV LAB;  Service: Cardiovascular;  Laterality: Bilateral;   TOTAL HIP ARTHROPLASTY Left 03/07/2022   Procedure: TOTAL HIP ARTHROPLASTY ANTERIOR APPROACH;  Surgeon: Ollen Gross, MD;  Location: [facility] ORS;  Service: Orthopedics;  Laterality: Left;   Social History   Socioeconomic History   Marital status: Married    Spouse name: Carollee Herter   Number of children: 3   Years of education: JD   Highest education level: Professional school degree (e.g., MD, DDS, DVM, JD)  Occupational History   Occupation: Lawyer  Tobacco Use   Smoking status: Never   Smokeless tobacco: Never  Vaping Use   Vaping status: Never Used  Substance and Sexual Activity   Alcohol use: Yes    Comment: occasional   Drug use: No   Sexual activity:  Yes    Birth control/protection: None  Other Topics Concern   Not on file  Social History Narrative   Not on file   Social Determinants of Health   Financial Resource Strain: Not on file  Food Insecurity: No Food Insecurity (11/13/2022)   Hunger Vital Sign    Worried About Running Out of Food in the Last Year: Never true    Ran Out of Food in the Last Year: Never true  Transportation Needs: No Transportation Needs (11/13/2022)   PRAPARE - Administrator, Civil Service (Medical): No    Lack of Transportation (Non-Medical):  No  Physical Activity: Not on file  Stress: Not on file  Social Connections: Not on file   No Known Allergies Family History  Problem Relation Age of Onset   Nephrolithiasis Mother    Cancer Father      Current Outpatient Medications (Cardiovascular):    atorvastatin (LIPITOR) 20 MG tablet, Take 1 tablet (20 mg total) by mouth daily.   metoprolol succinate (TOPROL-XL) 25 MG 24 hr tablet, Take 1 tablet (25 mg) by mouth once daily   metoprolol succinate (TOPROL-XL) 25 MG 24 hr tablet, Take 1 tablet (25 mg total) by mouth once daily   Current Outpatient Medications (Analgesics):    aspirin EC 81 MG tablet, Take 81 mg by mouth daily.   Current Outpatient Medications (Other):    Baclofen 5 MG TABS, Take 1 tablet (5 mg total) by mouth 3 (three) times daily as needed for muscle spasms.   cyclobenzaprine (FLEXERIL) 5 MG tablet, Take 1 tablet (5 mg total) by mouth 3 (three) times daily as needed for muscle spasms.   diclofenac Sodium (VOLTAREN) 1 % GEL, Apply 4 g topically 4 (four) times daily. (Patient not taking: Reported on 04/01/2023)   gabapentin (NEURONTIN) 300 MG capsule, Take 1 capsule (300 mg total) by mouth 3 (three) times daily.   Multiple Vitamin (MULTIVITAMIN WITH MINERALS) TABS tablet, Take 1 tablet by mouth in the morning.   omeprazole (PRILOSEC) 40 MG capsule, Take 1 capsule (40 mg total) by mouth once daily   Reviewed prior external information including notes and imaging from  primary care provider As well as notes that were available from care everywhere and other healthcare systems.  Past medical history, social, surgical and family history all reviewed in electronic medical record.  No pertanent information unless stated regarding to the chief complaint.   Review of Systems:  No headache, visual changes, nausea, vomiting, diarrhea, constipation, dizziness, abdominal pain, skin rash, fevers, chills, night sweats, weight loss, swollen lymph nodes, body aches, joint  swelling, chest pain, shortness of breath, mood changes. POSITIVE muscle aches  Objective  Blood pressure 106/66, pulse 72, height 6\' 1"  (1.854 m), weight 191 lb (86.6 kg), SpO2 97%.   General: No apparent distress alert and oriented x3 mood and affect normal, dressed appropriately.  HEENT: Pupils equal, extraocular movements intact  Respiratory: Patient's speak in full sentences and does not appear short of breath  Cardiovascular: No lower extremity edema, non tender, no erythema  Left shoulder exam does have some limited range of motion that does seem to be more anatomical.  And still has a significant weakness noted below.  Seems to be more on the shoulder overall.  Limited internal and external range of motion.   Procedure: Real-time Ultrasound Guided Injection of left glenohumeral joint Device: GE Logiq E  Ultrasound guided injection is preferred based studies that show increased duration, increased effect, greater  accuracy, decreased procedural pain, increased response rate with ultrasound guided versus blind injection.  Verbal informed consent obtained.  Time-out conducted.  Noted no overlying erythema, induration, or other signs of local infection.  Skin prepped in a sterile fashion.  Local anesthesia: Topical Ethyl chloride.  With sterile technique and under real time ultrasound guidance:  Joint visualized.  21g 2 inch needle inserted posterior approach. Pictures taken for needle placement. Patient did have injection of 2 cc of 0.5% Marcaine, and 1cc of Kenalog 40 mg/dL. Completed without difficulty  Pain immediately resolved suggesting accurate placement of the medication.  Advised to call if fevers/chills, erythema, induration, drainage, or persistent bleeding.  Impression: Technically successful ultrasound guided injection.  Procedure: Real-time Ultrasound Guided Injection of left acromioclavicular joint Device: GE Logiq Q7 Ultrasound guided injection is preferred based  studies that show increased duration, increased effect, greater accuracy, decreased procedural pain, increased response rate, and decreased cost with ultrasound guided versus blind injection.  Verbal informed consent obtained.  Time-out conducted.  Noted no overlying erythema, induration, or other signs of local infection.  Skin prepped in a sterile fashion.  Local anesthesia: Topical Ethyl chloride.  With sterile technique and under real time ultrasound guidance: With a 25-gauge half inch needle injected with 0.5 cc of 0.5% Marcaine and 0.5 cc of Kenalog 40 mg per Completed without difficulty  Pain immediately resolved suggesting accurate placement of the medication.  Advised to call if fevers/chills, erythema, induration, drainage, or persistent bleeding.  Impression: Technically successful ultrasound guided injection.   Impression and Recommendations:     The above documentation has been reviewed and is accurate and complete Judi Saa, DO

## 2023-04-18 NOTE — Telephone Encounter (Signed)
Attempted to contact patient to schedule OP MBS. Left VM. RKEEL 

## 2023-04-23 ENCOUNTER — Telehealth (HOSPITAL_COMMUNITY): Payer: Self-pay | Admitting: *Deleted

## 2023-04-23 ENCOUNTER — Ambulatory Visit (INDEPENDENT_AMBULATORY_CARE_PROVIDER_SITE_OTHER): Payer: 59

## 2023-04-23 ENCOUNTER — Other Ambulatory Visit: Payer: Self-pay

## 2023-04-23 ENCOUNTER — Ambulatory Visit: Payer: 59 | Admitting: Family Medicine

## 2023-04-23 VITALS — BP 106/66 | HR 72 | Ht 73.0 in | Wt 191.0 lb

## 2023-04-23 DIAGNOSIS — M25412 Effusion, left shoulder: Secondary | ICD-10-CM

## 2023-04-23 DIAGNOSIS — M25512 Pain in left shoulder: Secondary | ICD-10-CM

## 2023-04-23 DIAGNOSIS — G8929 Other chronic pain: Secondary | ICD-10-CM

## 2023-04-23 DIAGNOSIS — G959 Disease of spinal cord, unspecified: Secondary | ICD-10-CM | POA: Diagnosis not present

## 2023-04-23 NOTE — Telephone Encounter (Signed)
Attempted to contact patient to schedule OP MBS. Left VM. RKEEL 

## 2023-04-23 NOTE — Patient Instructions (Addendum)
Injection in shoulder today Write [place] in MyChart in 2 weeks to update [place] on progress and see if we need to order MRI Xray today on way out  See you again in 7-8 weeks otherwise

## 2023-04-23 NOTE — Assessment & Plan Note (Signed)
Patient does have weakness still noted.  Still feels little more neurologic in nature but given injection to see how patient responds.  Could be some underlying arthritic changes as well and will get further imaging with x-ray.  Continue to have weakness and limited range of motion which is significantly worse.  We do need to consider the possibility of advanced imaging.  Follow-up with me again in 8 weeks otherwise.

## 2023-04-23 NOTE — Assessment & Plan Note (Signed)
Patient given injection and tolerated the procedure well, discussed icing regimen and home exercises, discussed with patient that this could be some underlying arthritis and will get x-rays to further evaluate.  Follow-up again in 6 to 8 weeks otherwise.

## 2023-04-26 ENCOUNTER — Other Ambulatory Visit (HOSPITAL_COMMUNITY): Payer: Self-pay

## 2023-04-27 ENCOUNTER — Other Ambulatory Visit (HOSPITAL_COMMUNITY): Payer: Self-pay

## 2023-04-29 ENCOUNTER — Ambulatory Visit: Payer: 59

## 2023-04-29 ENCOUNTER — Encounter: Payer: Self-pay | Admitting: Family Medicine

## 2023-04-29 ENCOUNTER — Other Ambulatory Visit: Payer: Self-pay

## 2023-04-29 ENCOUNTER — Other Ambulatory Visit (HOSPITAL_COMMUNITY): Payer: Self-pay | Admitting: *Deleted

## 2023-04-29 DIAGNOSIS — G8929 Other chronic pain: Secondary | ICD-10-CM

## 2023-04-29 DIAGNOSIS — R1310 Dysphagia, unspecified: Secondary | ICD-10-CM

## 2023-04-30 ENCOUNTER — Other Ambulatory Visit (HOSPITAL_COMMUNITY): Payer: Self-pay

## 2023-05-01 ENCOUNTER — Ambulatory Visit: Payer: 59 | Admitting: Speech Pathology

## 2023-05-06 ENCOUNTER — Ambulatory Visit: Payer: 59

## 2023-05-06 ENCOUNTER — Other Ambulatory Visit (HOSPITAL_COMMUNITY): Payer: Self-pay

## 2023-05-08 ENCOUNTER — Ambulatory Visit: Payer: 59

## 2023-05-09 ENCOUNTER — Ambulatory Visit (HOSPITAL_COMMUNITY)
Admission: RE | Admit: 2023-05-09 | Discharge: 2023-05-09 | Disposition: A | Discharge status: Home / Self Care | Payer: 59 | Source: Ambulatory Visit | Attending: Internal Medicine | Admitting: Internal Medicine

## 2023-05-09 DIAGNOSIS — R1310 Dysphagia, unspecified: Secondary | ICD-10-CM

## 2023-05-09 DIAGNOSIS — R059 Cough, unspecified: Secondary | ICD-10-CM | POA: Diagnosis not present

## 2023-05-09 DIAGNOSIS — R1313 Dysphagia, pharyngeal phase: Secondary | ICD-10-CM | POA: Diagnosis present

## 2023-05-09 NOTE — Progress Notes (Signed)
Modified Barium Swallow Study  Patient Details  Name: Johnny Robinson MRN: [id number] Date of Birth: 1966/07/28  Today's Date: 05/09/2023  Modified Barium Swallow completed.  Full report located under Chart Review in the Imaging Section.  History of Present Illness 57 yr old s/p ACDF C3-6 in 10/2022 with residual left sided weakness and was in physical therapy. Other PMH: cervical myelopathy, CKD, GERD. Had barium esophagram 01/2023 revealing hiatal hernia with pill stuck mid esophagus transiting with additional barium. Pt reports coughing with liquids "if not paying attention to how I am swallowing" and pharyngeal globus sensation greater with solids than liquids.   Clinical Impression Pt exhibited mild pharyngeal phase dysphagia marked by mildly decreased tongue base retraction resulting in pharyngeal residue. Vallecular residue was mild with thinner consistencies and increased to moderate with puree and solid. Pyriform sinus residue was trace to mild with thin and nectar. Pt has history of left sided weakness following surgery and a head turn to the left was not significant in decreasing residue nor did a chin tuck with puree. Effective strategies that significantly decreased residue were 2-3 subsequent swallows and liquid trials following solids. No aspiration present throughout study or when challenged with sequential sips thin. One trace flash penetration of thin while taking pill. Pill stopped in proximal esophagus with liquid wash being ineffective to advance but puree texture transited pill to stomach. Recommend he continue regular texture, thin liquids, swallow additional times as needed (2) and alternate liquids and solids. Pt may use applesauce/pudding/yogurt to assist in pill transit if needed. Factors that may increase risk of adverse event in presence of aspiration Rubye Oaks & Clearance Coots 2021):    Swallow Evaluation Recommendations Recommendations: PO diet PO Diet Recommendation: Regular;Thin  liquids (Level 0) Liquid Administration via: Cup;Straw Medication Administration: Whole meds with liquid Supervision: Patient able to self-feed Swallowing strategies  : Slow rate;Small bites/sips;Multiple dry swallows after each bite/sip;Follow solids with liquids Postural changes: Position pt fully upright for meals Oral care recommendations: Oral care BID (2x/day)      Royce Macadamia 05/09/2023,2:43 PM

## 2023-05-10 ENCOUNTER — Ambulatory Visit: Payer: 59 | Attending: Physical Medicine and Rehabilitation

## 2023-05-10 DIAGNOSIS — R1310 Dysphagia, unspecified: Secondary | ICD-10-CM | POA: Diagnosis present

## 2023-05-10 NOTE — Patient Instructions (Signed)
   SWALLOWING EXERCISES Do 6/7 days per week for 8 weeks, then 2-3 times per week afterwards  Effortful Swallows - Press your tongue against the roof of your mouth for 3 seconds, then squeeze the muscles in your neck while you swallow your saliva or a sip of water - Repeat 10-15 times, 2 times a day, and use whenever you eat or drink  Masako Swallow - swallow with your tongue sticking out - Stick tongue out past your lips and gently bite tongue with your teeth - Swallow, while holding your tongue with your teeth - Repeat 10-15 times, 2 times a day *use a wet spoon if your mouth gets dry*  Loud Pitch Raise - Say "he" loudly, from as low of a pitch to as high of a pitch as you can - Repeat 20 times, 2-3 times a day   When eating: - Alternate bites and sips - swallow 2-3 additional times - use puree with pills as needed

## 2023-05-10 NOTE — Therapy (Signed)
OUTPATIENT SPEECH LANGUAGE PATHOLOGY SWALLOW TREATMENT   Patient Name: Johnny Robinson MRN: [id number] DOB:1966-05-08, 57 y.o., male Today's Date: 05/10/2023  PCP: Aram Beecham, MD REFERRING PROVIDER: Genice Rouge, MD  END OF SESSION:  End of Session - 05/10/23 0851     Visit Number 2    Number of Visits 8    Date for SLP Re-Evaluation 06/10/23    SLP Start Time 0804    SLP Stop Time  0837    SLP Time Calculation (min) 33 min    Activity Tolerance Patient tolerated treatment well              Past Medical History:  Diagnosis Date   Arthritis    Chronic kidney disease 2016   RENAL INFARCT   GERD (gastroesophageal reflux disease)    Hypertension    Reflux    Subdural hematoma (HCC) 1991   Past Surgical History:  Procedure Laterality Date   BACK SURGERY  11/2017   burr hole  1992   to evacuate SDH   COLONOSCOPY WITH PROPOFOL N/A 09/02/2018   Procedure: COLONOSCOPY WITH PROPOFOL;  Surgeon: Midge Minium, MD;  Location: [facility] ENDOSCOPY;  Service: Endoscopy;  Laterality: N/A;   EXCISION MASS ABDOMINAL Left 05/28/2018   Procedure: EXCISIONAL BIOPSY MASS ABDOMINAL WALL;  Surgeon: Carolan Shiver, MD;  Location: [facility] ORS;  Service: General;  Laterality: Left;   PERIPHERAL VASCULAR CATHETERIZATION Right 05/18/2015   Procedure: Renal Angiography;  Surgeon: Annice Needy, MD;  Location: [facility] INVASIVE CV LAB;  Service: Cardiovascular;  Laterality: Right;   PERIPHERAL VASCULAR CATHETERIZATION Bilateral 05/18/2015   Procedure: Renal Intervention;  Surgeon: Annice Needy, MD;  Location: [facility] INVASIVE CV LAB;  Service: Cardiovascular;  Laterality: Bilateral;   TOTAL HIP ARTHROPLASTY Left 03/07/2022   Procedure: TOTAL HIP ARTHROPLASTY ANTERIOR APPROACH;  Surgeon: Ollen Gross, MD;  Location: [facility] ORS;  Service: Orthopedics;  Laterality: Left;   Patient Active Problem List   Diagnosis Date Noted   Effusion of acromioclavicular joint, left 04/23/2023   Spasticity 04/01/2023    Nerve pain 12/31/2022   Left shoulder pain 11/30/2022   Urinary hesitancy 11/30/2022   Constipation 11/30/2022   Biceps tendinitis of left shoulder 11/30/2022   Multifocal pneumonia 11/30/2022   Chronic pain syndrome 11/21/2022   HNP (herniated nucleus pulposus) with myelopathy, cervical 11/15/2022   Cervical myelopathy (HCC) 11/13/2022   OA (osteoarthritis) of hip 03/07/2022   Primary osteoarthritis of left hip 03/07/2022   GERD (gastroesophageal reflux disease) 11/27/2021   Encounter for screening colonoscopy    Benign neoplasm of rectosigmoid junction    Hyperlipidemia 12/18/2017   Essential hypertension 12/17/2016   Chest pain 05/30/2015   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    ONSET DATE: February 2024   REFERRING DIAG: R13.10 (ICD-10-CM) - Dysphagia, unspecified type  THERAPY DIAG:  Dysphagia, unspecified type  Rationale for Evaluation and Treatment: Rehabilitation  SUBJECTIVE:   SUBJECTIVE STATEMENT: "When I have peanut butter crackers at work they seem to get stuck right here." (Points to thyroid area) Pt accompanied by: self  PERTINENT HISTORY: 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 Dr. Jordan Likes expressed concerns of cord contusion due to worsening of myelopathy and MRI spine  done which showed post op changes form ACDF C3-C6 and patchy signal abnormality involving the central and left cord at C5/6 concerning for contusion/injury.  PAIN:  Are you having pain? No  FALLS: Has patient fallen in last 6 months?  See PT evaluation for details  LIVING ENVIRONMENT: Lives with: lives with their spouse Lives in: House/apartment  PLOF:   Level of assistance: Independent with ADLs, Independent with IADLs Employment: Full-time employment  PATIENT GOALS: Inquire whether something is wrong with swallowing, after learning SLP treats swallowing.  OBJECTIVE:   DIAGNOSTIC FINDINGS:  02/13/23- ESOPHAGUS/BARIUM SWALLOW/TABLET STUDY TECHNIQUE: Combined double and single contrast examination was performed using effervescent crystals, high-density barium, and thin liquid barium. This exam was performed by Alwyn Ren, NP, and was supervised and interpreted by Dr. Ernestina Penna. FLUOROSCOPY: Radiation Exposure Index (as provided by the fluoroscopic device): 23.40 mGy reference air Kerma Fluoroscopy time: 3.1 minutes COMPARISON:  None Available FINDINGS: Swallowing: Appears normal. No vestibular penetration or aspiration seen. Pharynx: Unremarkable. Esophagus: Normal appearance. Esophageal motility: Within normal limits. Hiatal Hernia: Small sliding-type hiatal hernia. Gastroesophageal reflux: None visualized. Ingested 13mm barium tablet: Became stuck mid esophagus but passed with subsequent barium administration. Other: None. IMPRESSION: No esophageal mass or stricture.  Small sliding-type hiatal hernia.  11/13/22 cervical MRI:   Postoperative changes from prior ACDF at C3 through C6 without significant residual spinal stenosis.Patchy signal abnormality involving the central and left cord at the level of C5-6, concerning for contusion/injury.   11/23/22 cervical xray:  Prior ACDF from C3-C6. Intact hardware without evidence of loosening. Persistent prevertebral soft tissue swelling.Mild degenerative disc disease at C6-C7. Mild to moderate multilevel facet arthropathy.  RESULTS FROM MBS (05/09/23) Clinical Impression: Clinical Impression: Pt exhibited mild pharyngeal phase dysphagia marked by mildly decreased tongue base retraction resulting in pharyngeal residue. Vallecular residue was mild with thinner consistencies and  increased to moderate with puree and solid. Pyriform sinus residue was trace to mild with thin and nectar. Pt has history of left sided weakness following surgery and a head turn to the left was not significant in decreasing residue nor did a chin tuck with puree. Effective strategies that significantly decreased residue were 2-3 subsequent swallows and liquid trials following solids. No aspiration present throughout study or when challenged with sequential sips thin. One trace flash penetration of thin while taking pill. Pill stopped in proximal esophagus with liquid wash being ineffective to advance but puree texture transited pill to stomach. Recommend he continue regular texture, thin liquids, swallow additional times as needed (2) and alternate liquids and solids. Pt may use applesauce/pudding/yogurt to assist in pill transit if needed.    PATIENT REPORTED OUTCOME MEASURES (PROM): EAT-10 not provided due to pt comfort level with current status.    TODAY'S TREATMENT:  DATE: 05/10/23: SLP reviewed results of MBS yesterday with pt, including safe swallow precautions. Pt completed HEP for oropharyngeal strengthening with rare min A faded to modified independence. He ate fig bar and drank water while  independently following safe swallow precautions.  PATIENT EDUCATION: Education details: see "today's treatment" Person educated: Patient Education method: Explanation, Demonstration, and Verbal cues Education comprehension: verbalized understanding, returned demonstration, and verbal cues required   ASSESSMENT:  CLINICAL IMPRESSION: Patient is a 57 y.o. M who was seen today following MBS yesterday due to pt report of pharyngeal residue with heavier and more sticky/creamy foods like peanut butter crackers. Please see "today's therapy" for more details. Johnny Robinson will be  followed again in 4 weeks to ensure accuracy with HEP and to assess if safe swallow techniques are still helpful for him. He agreed with this plan. STGs and LTGs were modified today to match this plan.  OBJECTIVE IMPAIRMENTS: include dysphagia. These impairments are limiting patient from safety when swallowing. Factors affecting potential to achieve goals and functional outcome are  possible structural etiology for dysphagia . Patient will benefit from skilled SLP services to address above impairments and improve overall function.  REHAB POTENTIAL: Good   GOALS: Goals reviewed with patient? No  SHORT TERM GOALS: omitted 05/10/23, due to pt's plan of care    LONG TERM GOALS: Target date: 06/10/23  Pt will follow safe swallow strategies from MBSS with modified independence over two sessions Baseline:  Goal status: INITIAL  2.  Pt will complete swallow HEP with modified independene over 2 sessions Baseline:  Goal status: INITIAL  3.  Pt will improve score on EAT-10 the last 1-2 ST visits, compared to initial score  Baseline:  Goal status: DEFERRED due to pt's comfort level with current deficits    PLAN:  SLP FREQUENCY:  once every approx 4 weeks  SLP DURATION: 8 weeks  PLANNED INTERVENTIONS: Aspiration precaution training, Pharyngeal strengthening exercises, Diet toleration management , Environmental controls, Trials of upgraded texture/liquids, Cueing hierachy, Internal/external aids, SLP instruction and feedback, Compensatory strategies, and Patient/family education    [facility], CCC-SLP 05/10/2023, 8:51 AM

## 2023-05-13 ENCOUNTER — Ambulatory Visit: Payer: 59

## 2023-05-14 ENCOUNTER — Other Ambulatory Visit (HOSPITAL_COMMUNITY): Payer: Self-pay

## 2023-05-15 ENCOUNTER — Ambulatory Visit: Payer: 59

## 2023-05-16 ENCOUNTER — Other Ambulatory Visit (HOSPITAL_COMMUNITY): Payer: Self-pay

## 2023-05-22 ENCOUNTER — Ambulatory Visit: Payer: 59

## 2023-05-28 ENCOUNTER — Ambulatory Visit: Payer: 59

## 2023-05-30 ENCOUNTER — Encounter: Payer: Self-pay | Admitting: Family Medicine

## 2023-06-07 ENCOUNTER — Ambulatory Visit
Admission: RE | Admit: 2023-06-07 | Discharge: 2023-06-07 | Disposition: A | Discharge status: Home / Self Care | Payer: 59 | Source: Ambulatory Visit | Attending: Family Medicine | Admitting: Family Medicine

## 2023-06-07 DIAGNOSIS — G8929 Other chronic pain: Secondary | ICD-10-CM

## 2023-06-07 MED ORDER — IOPAMIDOL (ISOVUE-M 200) INJECTION 41%
10.0000 mL | Freq: Once | INTRAMUSCULAR | Status: AC
  Administered 2023-06-07: 10 mL via INTRA_ARTICULAR

## 2023-06-14 ENCOUNTER — Ambulatory Visit: Payer: 59 | Attending: Physical Medicine and Rehabilitation

## 2023-06-15 ENCOUNTER — Other Ambulatory Visit (HOSPITAL_COMMUNITY): Payer: Self-pay

## 2023-06-16 ENCOUNTER — Encounter: Payer: Self-pay | Admitting: Family Medicine

## 2023-06-19 ENCOUNTER — Other Ambulatory Visit (HOSPITAL_COMMUNITY): Payer: Self-pay

## 2023-06-19 MED ORDER — TAMSULOSIN HCL 0.4 MG PO CAPS
0.4000 mg | ORAL_CAPSULE | Freq: Every day | ORAL | 3 refills | Status: DC
  Filled 2023-06-19 (×2): qty 30, 30d supply, fill #0

## 2023-07-01 ENCOUNTER — Other Ambulatory Visit (HOSPITAL_COMMUNITY): Payer: Self-pay

## 2023-07-01 ENCOUNTER — Ambulatory Visit: Payer: 59 | Admitting: Neurosurgery

## 2023-07-01 ENCOUNTER — Encounter: Payer: Self-pay | Admitting: Neurosurgery

## 2023-07-01 ENCOUNTER — Encounter: Payer: Self-pay | Admitting: Physical Medicine and Rehabilitation

## 2023-07-01 ENCOUNTER — Encounter: Payer: 59 | Attending: Physical Medicine and Rehabilitation | Admitting: Physical Medicine and Rehabilitation

## 2023-07-01 VITALS — BP 100/72 | Ht 73.0 in | Wt 185.6 lb

## 2023-07-01 VITALS — BP 104/74 | HR 85 | Ht 73.0 in | Wt 185.0 lb

## 2023-07-01 DIAGNOSIS — N319 Neuromuscular dysfunction of bladder, unspecified: Secondary | ICD-10-CM | POA: Insufficient documentation

## 2023-07-01 DIAGNOSIS — M792 Neuralgia and neuritis, unspecified: Secondary | ICD-10-CM

## 2023-07-01 DIAGNOSIS — M25512 Pain in left shoulder: Secondary | ICD-10-CM

## 2023-07-01 DIAGNOSIS — M62519 Muscle wasting and atrophy, not elsewhere classified, unspecified shoulder: Secondary | ICD-10-CM

## 2023-07-01 DIAGNOSIS — R252 Cramp and spasm: Secondary | ICD-10-CM | POA: Insufficient documentation

## 2023-07-01 DIAGNOSIS — G959 Disease of spinal cord, unspecified: Secondary | ICD-10-CM | POA: Insufficient documentation

## 2023-07-01 MED ORDER — BACLOFEN 10 MG PO TABS
10.0000 mg | ORAL_TABLET | Freq: Three times a day (TID) | ORAL | 5 refills | Status: DC
Start: 2023-07-01 — End: 2024-02-05
  Filled 2023-07-01: qty 90, 30d supply, fill #0
  Filled 2023-08-07: qty 90, 30d supply, fill #1
  Filled 2023-11-22: qty 90, 30d supply, fill #2
  Filled 2024-01-23: qty 90, 30d supply, fill #3

## 2023-07-01 NOTE — Patient Instructions (Signed)
Pt is a 57 year old male with Cervical myelopathy and L sided weakness s/p C3-6 ACDF; myofascial pain s/p trigger point injections, L shoulder pain- prior multifocal pneumonia-  Here for  f/u on Cervical myelopathy with LUE weakness and L shoulder pain.   Has BP on low side all the time.   - which can affect Spasticity meds.   2. Went over Baclofen vs Zanaflex- decided to do Baclofen.    3. Baclofen 5 mg 3x/day x 1 week, then increase to 10 mg 3x/day.  Might need a higher dose in the long run-  Spasticity progresses up to 1-2 years.   4. Call me in 2-3 weeks- to let me know where you're at.    5.  Referral to Dr Ernestine Mcmurray- for CAUSE of spasticity- and see if there's anything else that can be done for shoulder.  D/w Dr Katrinka Blazing- will see him at 10:30 am.    6.  Wait on nerve pain meds- for now- since think spasticity is the main issue   7. Has appointment with Urology- make sure they know have Cervical myelopathy, since affects bladder sometimes.    8. Caffeine can irritate neurogenic bladder.    9. F/U in 3 months- but clal me to let me know how things working.    10. Of note, distal arm is STRONGER than before- shoulder is much weaker, but hard to test, because spasticity,

## 2023-07-01 NOTE — Progress Notes (Signed)
Referring Physician:  Genice Rouge, MD [phone] [address] [place],  [place] [address]  Primary Physician:  Marguarite Arbour, MD  History of Present Illness: 07/01/2023 Johnny Robinson is here today with a chief complaint of left upper extremity weakness and pain.  He has a complicated past medical history.  Previous had multiple spinal operations.  Underwent a anterior cervical discectomy and fusion and had a spinal contusion causing left-sided hemibody weakness.  Fortunately during his recovery he had a significant improvement in his hand left upper extremity and left lower extremity.  He regained the ability to ambulate.  He regained good function in his left upper extremity and was exercising in the gym, he still had notable deficits on the left side however felt like he was at least antigravity with some resistance.  Unfortunately he had a worsening of his symptoms that was correlated with a severe onset of left-sided shoulder pain and periscapular pain.  He feels like he was having some wasting of his left upper extremity in the proximal region, however is unsure whether or not this worsened significantly after the onset of pain.  His pain in the shoulder region is mostly with significant manipulation, however he does have some tenderness to light palpation as well representing possible dysesthesias.  Review of Systems:  A 10 point review of systems is negative, except for the pertinent positives and negatives detailed in the HPI.  Past Medical History: Past Medical History:  Diagnosis Date   Arthritis    Chronic kidney disease 2016   RENAL INFARCT   GERD (gastroesophageal reflux disease)    Hypertension    Reflux    Subdural hematoma (HCC) 1991    Past Surgical History: Past Surgical History:  Procedure Laterality Date   BACK SURGERY  11/2017   burr hole  1992   to evacuate SDH   COLONOSCOPY WITH PROPOFOL N/A 09/02/2018   Procedure: COLONOSCOPY WITH PROPOFOL;  Surgeon:  Midge Minium, MD;  Location: [facility] ENDOSCOPY;  Service: Endoscopy;  Laterality: N/A;   EXCISION MASS ABDOMINAL Left 05/28/2018   Procedure: EXCISIONAL BIOPSY MASS ABDOMINAL WALL;  Surgeon: Carolan Shiver, MD;  Location: [facility] ORS;  Service: General;  Laterality: Left;   PERIPHERAL VASCULAR CATHETERIZATION Right 05/18/2015   Procedure: Renal Angiography;  Surgeon: Annice Needy, MD;  Location: [facility] INVASIVE CV LAB;  Service: Cardiovascular;  Laterality: Right;   PERIPHERAL VASCULAR CATHETERIZATION Bilateral 05/18/2015   Procedure: Renal Intervention;  Surgeon: Annice Needy, MD;  Location: [facility] INVASIVE CV LAB;  Service: Cardiovascular;  Laterality: Bilateral;   TOTAL HIP ARTHROPLASTY Left 03/07/2022   Procedure: TOTAL HIP ARTHROPLASTY ANTERIOR APPROACH;  Surgeon: Ollen Gross, MD;  Location: [facility] ORS;  Service: Orthopedics;  Laterality: Left;    Allergies: Allergies as of 07/01/2023   (No Known Allergies)    Medications:  Current Outpatient Medications:    aspirin EC 81 MG tablet, Take 81 mg by mouth daily., Disp: , Rfl:    atorvastatin (LIPITOR) 20 MG tablet, Take 1 tablet (20 mg total) by mouth daily., Disp: 90 tablet, Rfl: 3   baclofen (LIORESAL) 10 MG tablet, Take 1 tablet (10 mg total) by mouth 3 (three) times daily., Disp: 90 each, Rfl: 5   cyclobenzaprine (FLEXERIL) 5 MG tablet, Take 1 tablet (5 mg total) by mouth 3 (three) times daily as needed for muscle spasms., Disp: 30 tablet, Rfl: 0   diclofenac Sodium (VOLTAREN) 1 % GEL, Apply 4 g topically 4 (four) times  daily., Disp: 400 g, Rfl: 0   gabapentin (NEURONTIN) 300 MG capsule, Take 1 capsule (300 mg total) by mouth 3 (three) times daily., Disp: 90 capsule, Rfl: 5   metoprolol succinate (TOPROL-XL) 25 MG 24 hr tablet, Take 1 tablet (25 mg) by mouth once daily, Disp: 90 tablet, Rfl: 3   metoprolol succinate (TOPROL-XL) 25 MG 24 hr tablet, Take 1 tablet (25 mg total) by mouth once daily, Disp: 90 tablet, Rfl: 3   Multiple Vitamin  (MULTIVITAMIN WITH MINERALS) TABS tablet, Take 1 tablet by mouth in the morning., Disp: , Rfl:    omeprazole (PRILOSEC) 40 MG capsule, Take 1 capsule (40 mg total) by mouth once daily, Disp: 90 capsule, Rfl: 3   tamsulosin (FLOMAX) 0.4 MG CAPS capsule, Take 1 capsule (0.4 mg total) by mouth daily. Take 30 minutes after the same meal each day., Disp: 30 capsule, Rfl: 3  Social History: Social History   Tobacco Use   Smoking status: Never   Smokeless tobacco: Never  Vaping Use   Vaping status: Never Used  Substance Use Topics   Alcohol use: Yes    Comment: occasional   Drug use: No    Family Medical History: Family History  Problem Relation Age of Onset   Nephrolithiasis Mother    Cancer Father     Physical Examination: Vitals:   07/01/23 1041  BP: 100/72    General: Patient is in no apparent distress. Attention to examination is appropriate.  Neck:   Supple.  Incision CDI  Respiratory: Patient is breathing without any difficulty.   NEUROLOGICAL:     Awake, alert, oriented to person, place, and time.  Speech is clear and fluent.   Cranial Nerves: Pupils equal round and reactive to light.  Facial tone is symmetric. Shoulder shrug is symmetric. Tongue protrusion is midline.  There is no pronator drift.  Motor Exam:  Focused examination of the left upper extremity demonstrates some slight weakness in his finger abduction and grip.  Also has weakness in wrist extension.  Biceps triceps demonstrate at least 4+ strength.  Deltoid strength is severely limited approximately 2 out of 5.  He has activation of his external rotation noted, but does have severe wasting of his supra and infraspinatus fossa bilaterally.  Notable hyper flaccid in the left upper extremity with pathologic reflexes.  Decreased proprioception and light touch sensation on the left upper extremity, decreased pain and temperature sensation in the right upper extremity.  Gait is normal.   Medical Decision  Making  Imaging: Narrative & Impression  CLINICAL DATA:  Chronic shoulder pain and weakness with decreased range of motion   EXAM: MRI OF THE LEFT SHOULDER WITH CONTRAST   TECHNIQUE: Multiplanar, multisequence MR imaging of the left shoulder was performed following the administration of intra-articular contrast.   CONTRAST:  See Injection Documentation.   COMPARISON:  None Available.   FINDINGS: Rotator cuff: Mild tendinosis of the supraspinatus tendon. Infraspinatus tendon is intact. Teres minor tendon is intact. Subscapularis tendon is intact.   Muscles: No significant muscle atrophy. Muscle edema throughout the supraspinatus and infraspinatus muscles which may be secondary to an inflammatory or neurogenic etiology versus muscle strain.   Biceps Long Head: Intraarticular and extraarticular portions of the biceps tendon are intact.   Acromioclavicular Joint: Mild arthropathy of the acromioclavicular joint. No subacromial/subdeltoid bursal fluid.   Glenohumeral Joint: Intraarticular contrast distending the joint capsule. Normal glenohumeral ligaments. No chondral defect.   Labrum: Intact.   Bones: No fracture or  dislocation. No aggressive osseous lesion.   Other: No fluid collection or hematoma.   IMPRESSION: 1. Mild tendinosis of the supraspinatus tendon. 2. Muscle edema throughout the supraspinatus and infraspinatus muscles which may be secondary to an inflammatory or neurogenic etiology versus muscle strain.     Electronically Signed   By: Elige Ko M.D.   On: 06/16/2023 08:05   Narrative & Impression  CLINICAL DATA:  Neck pain   EXAM: CERVICAL SPINE - 2-3 VIEW   COMPARISON:  MRI cervical spine 11/14/2022.   FINDINGS: Prior C3-C6 ACDF. Hardware is intact without evidence of loosening. There is mild degenerative disc disease at C6-C7. There is mild to moderate multilevel facet arthropathy. Persistent prevertebral soft tissue swelling.    IMPRESSION: Prior ACDF from C3-C6. Intact hardware without evidence of loosening.   Persistent prevertebral soft tissue swelling.   Mild degenerative disc disease at C6-C7. Mild to moderate multilevel facet arthropathy.     Electronically Signed   By: Caprice Renshaw M.D.   On: 11/23/2022 12:39   Narrative & Impression  CLINICAL DATA:  Initial evaluation for left-sided weakness status post cervical ACDF.   EXAM: MRI CERVICAL SPINE WITHOUT AND WITH CONTRAST   TECHNIQUE: Multiplanar and multiecho pulse sequences of the cervical spine, to include the craniocervical junction and cervicothoracic junction, were obtained without and with intravenous contrast.   CONTRAST:  10mL GADAVIST GADOBUTROL 1 MMOL/ML IV SOLN   COMPARISON:  Comparison made with prior MRI from 10/24/2022.   FINDINGS: Alignment: Straightening of the normal cervical lordosis. No listhesis.   Vertebrae: Postoperative changes from prior ACDF at C3 through C6. Hardware appears appropriately positioned. Vertebral body height maintained with no visible acute or chronic fracture. Bone marrow signal intensity within normal limits. No worrisome osseous lesions. No abnormal marrow edema or enhancement.   Cord: Patchy signal abnormality involving the central and left cord at the level of C5-6, concerning for contusion/injury (series 9, images 25-28). Cord is somewhat swollen at this level (series 5, image 9). No abnormal enhancement. Remainder of the visualized cord otherwise demonstrates a normal signal and morphology. No epidural hematoma or other collection.   Posterior Fossa, vertebral arteries, paraspinal tissues: Visualized brain and posterior fossa within normal limits. Craniocervical junction normal. Diffuse prevertebral edema, felt to be within normal limits for normal expected postoperative changes. No visible loculated collections. Normal flow voids seen within the vertebral arteries bilaterally.    Disc levels:   C2-C3: Normal interspace. Mild left-sided facet hypertrophy. No canal or foraminal stenosis.   C3-C4: Prior ACDF. No significant residual spinal stenosis. Foramina remain patent.   C4-C5: Prior ACDF. No significant residual spinal stenosis. Foramina remain patent.   C5-C6: Prior ACDF. Residual mild flattening of the ventral thecal sac, asymmetric to the left, but no significant spinal stenosis. Foramina remain patent.   C6-C7: Mild diffuse disc bulge with endplate and uncovertebral spurring. Flattening of the ventral thecal sac without significant spinal stenosis. Moderate bilateral C7 foraminal stenosis, slightly worse on the left.   C7-T1: Normal interspace. Left greater than right facet hypertrophy. No spinal stenosis. Foramina remain patent.   IMPRESSION: 1. Postoperative changes from prior ACDF at C3 through C6 without significant residual spinal stenosis. 2. Patchy signal abnormality involving the central and left cord at the level of C5-6, concerning for contusion/injury. 3. Disc bulge with uncovertebral spurring at C6-7 with resultant moderate bilateral C7 foraminal stenosis.     Electronically Signed   By: Rise Mu M.D.   On: 11/14/2022 00:54  Electrodiagnostics: No electrodiagnostic testing available  I have personally reviewed the images and electrodiagnostics and agree with the above interpretation.  Assessment and Plan: Johnny Robinson is a pleasant 57 y.o. male with a rather complex history.  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  He states that immediately after his surgery he was having profound left-sided weakness consistent with the hemicord, thankfully he had significant improvement in his left lower extremity function as well as his left upper extremity function.  He noted that he was getting  multiple gains back and was able to do some resistance training in the gym.  He felt like he was antigravity in his shoulder but then developed a severe onset of left upper extremity pain mostly in the periscapular and shoulder region followed by worsening weakness.  He notes that most of his progressive symptoms were noted in the proximal left upper extremity and he felt like he continued to have improvements in his hand.  On physical examination he does show the sequela of a left-sided hemicord with decreased light touch sensation on the left and decreased pain and temperature sensation on the right.  He has some long track signs including hyperreflexia.  Notably however he does have some severe scapular muscle wasting as well as deltoid Muscle wasting.  Given his presentation I do feel like he would benefit from a set of back electrodiagnostic testing such as an EMG/nerve conduction study to evaluate for brachial plexopathy as he does have some signs of lower motor neuron damage including the significant muscle wasting on physical exam and MRI.  He does have spasticity as noted and has hyperreflexia as well from the spinal cord contusion, however we do feel it would be important to rule out etiologies such as an adhesive capsulitis given that a majority of his pain is with forced motion of the shoulder.  Once his EMG has been completed I like to follow-up with him.  I also urged him to keep his appointment with his orthopedic surgeon for evaluation of the shoulder.  Lovenia Kim MD/MSCR Neurosurgery - Peripheral Nerve Surgery

## 2023-07-01 NOTE — Progress Notes (Signed)
Subjective:    Patient ID: Johnny Robinson, male    DOB: December 26, 1965, 57 y.o.   MRN: [id number]  HPI  Pt is a 57 year old male with Cervical myelopathy and L sided weakness s/p C3-6 ACDF; myofascial pain s/p trigger point injections, L shoulder pain- prior multifocal pneumonia-  Here for  f/u on Cervical myelopathy with LUE weakness and L shoulder pain.     Dr Katrinka Blazing wanted to do EMG/NCS Shoulder MRI was negative  Movement still really compromised and just doesn't use LUE anymore.  If bumps it or moves the wrong way- can almost knock him out with pain.   When left hospital L hand was using estim Wasn't until end of July- couldn't arm/shoulder lift ~ 70 degrees was able to do full ROM PRIOR to July 1st.   Doesn't know if takes Baclofen- wife puts everything out.  Not taking 3x/day for sure.    Has Urology appointment coming up- lasted for 1 weke- needed to pee "all the time".  Voiding small amounts.   Pain Inventory Average Pain 4 Pain Right Now 4 My pain is constant and sharp  In the last 24 hours, has pain interfered with the following? General activity 5 Relation with others 5 Enjoyment of life 5 What TIME of day is your pain at its worst? morning , daytime, evening, night, and varies Sleep (in general) Fair  Pain is worse with: some activites Pain improves with: medication Relief from Meds: 5  Family History  Problem Relation Age of Onset   Nephrolithiasis Mother    Cancer Father    Social History   Socioeconomic History   Marital status: Married    Spouse name: Carollee Herter   Number of children: 3   Years of education: JD   Highest education level: Professional school degree (e.g., MD, DDS, DVM, JD)  Occupational History   Occupation: Clinical research associate  Tobacco Use   Smoking status: Never   Smokeless tobacco: Never  Vaping Use   Vaping status: Never Used  Substance and Sexual Activity   Alcohol use: Yes    Comment: occasional   Drug use: No   Sexual activity: Yes     Birth control/protection: None  Other Topics Concern   Not on file  Social History Narrative   Not on file   Social Determinants of Health   Financial Resource Strain: Not on file  Food Insecurity: No Food Insecurity (11/13/2022)   Hunger Vital Sign    Worried About Running Out of Food in the Last Year: Never true    Ran Out of Food in the Last Year: Never true  Transportation Needs: No Transportation Needs (11/13/2022)   PRAPARE - Administrator, Civil Service (Medical): No    Lack of Transportation (Non-Medical): No  Physical Activity: Not on file  Stress: Not on file  Social Connections: Not on file   Past Surgical History:  Procedure Laterality Date   BACK SURGERY  11/2017   burr hole  1992   to evacuate SDH   COLONOSCOPY WITH PROPOFOL N/A 09/02/2018   Procedure: COLONOSCOPY WITH PROPOFOL;  Surgeon: Midge Minium, MD;  Location: [facility] ENDOSCOPY;  Service: Endoscopy;  Laterality: N/A;   EXCISION MASS ABDOMINAL Left 05/28/2018   Procedure: EXCISIONAL BIOPSY MASS ABDOMINAL WALL;  Surgeon: Carolan Shiver, MD;  Location: [facility] ORS;  Service: General;  Laterality: Left;   PERIPHERAL VASCULAR CATHETERIZATION Right 05/18/2015   Procedure: Renal Angiography;  Surgeon: Annice Needy, MD;  Location: [facility] INV[facility] CV LAB;  Service: Cardiovascular;  Laterality: Right;   PERIPHERAL VASCULAR CATHETERIZATION Bilateral 05/18/2015   Procedure: Renal Intervention;  Surgeon: Annice Needy, MD;  Location: [facility];  Service: Cardiovascular;  Laterality: Bilateral;   TOTAL HIP ARTHROPLASTY Left 03/07/2022   Procedure: TOTAL HIP ARTHROPLASTY ANTERIOR APPROACH;  Surgeon: Ollen Gross, MD;  Location: [facility] ORS;  Service: Orthopedics;  Laterality: Left;   Past Surgical History:  Procedure Laterality Date   BACK SURGERY  11/2017   burr hole  1992   to evacuate SDH   COLONOSCOPY WITH PROPOFOL N/A 09/02/2018   Procedure: COLONOSCOPY WITH PROPOFOL;  Surgeon: Midge Minium, MD;   Location: [facility] ENDOSCOPY;  Service: Endoscopy;  Laterality: N/A;   EXCISION MASS ABDOMINAL Left 05/28/2018   Procedure: EXCISIONAL BIOPSY MASS ABDOMINAL WALL;  Surgeon: Carolan Shiver, MD;  Location: [facility] ORS;  Service: General;  Laterality: Left;   PERIPHERAL VASCULAR CATHETERIZATION Right 05/18/2015   Procedure: Renal Angiography;  Surgeon: Annice Needy, MD;  Location: [facility] IN[facility];  Service: Cardiovascular;  Laterality: Right;   PERIPHERAL VASCULAR CATHETERIZATION Bilateral 05/18/2015   Procedure: Renal Intervention;  Surgeon: Annice Needy, MD;  Location: [facility];  Service: Cardiovascular;  Laterality: Bilateral;   TOTAL HIP ARTHROPLASTY Left 03/07/2022   Procedure: TOTAL HIP ARTHROPLASTY ANTERIOR APPROACH;  Surgeon: Ollen Gross, MD;  Location: [facility] ORS;  Service: Orthopedics;  Laterality: Left;   Past Medical History:  Diagnosis Date   Arthritis    Chronic kidney disease 2016   RENAL INFARCT   GERD (gastroesophageal reflux disease)    Hypertension    Reflux    Subdural hematoma (HCC) 1991   BP 104/74   Pulse 85   Ht 6\' 1"  (1.854 m)   Wt 185 lb (83.9 kg)   SpO2 95%   BMI 24.41 kg/m   Opioid Risk Score:   Fall Risk Score:  `1  Depression screen [facility] 2/9     07/01/2023    9:26 AM 04/01/2023   10:30 AM 12/31/2022   10:43 AM  Depression screen PHQ 2/9  Decreased Interest 0 0 0  Down, Depressed, Hopeless 0 0 0  PHQ - 2 Score 0 0 0  Altered sleeping   0  Tired, decreased energy   0  Change in appetite   0  Feeling bad or failure about yourself    0  Trouble concentrating   0  Moving slowly or fidgety/restless   0  Suicidal thoughts   0  PHQ-9 Score   0    Review of Systems  Gastrointestinal:  Positive for vomiting.  Musculoskeletal:        Left shoulder pain  All other systems reviewed and are negative.      Objective:   Physical Exam  LUE: FA 5-/5; Grip 4-/5; WE 4+/5; Triceps and biceps 4+ to 5-/5; deltoid- 2/5- cannot lift arm  above 70 degrees and wincing from even 70 degrees abduction and flexion.  Neuro: MAS of 3 in L shoulder- due to increased tone- esp with shoulder external rotation- really bad.  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       Assessment & Plan:    Pt is a 57 year old male with Cervical myelopathy and L sided weakness s/p C3-6 ACDF; myofascial pain s/p trigger point injections, L shoulder pain- prior multifocal pneumonia-  Here for  f/u on Cervical myelopathy with LUE weakness and L shoulder pain.   Has BP on low  side all the time.   - which can affect Spasticity meds.   2. Went over Baclofen vs Zanaflex- decided to do Baclofen.    3. Baclofen 5 mg 3x/day x 1 week, then increase to 10 mg 3x/day.  Might need a higher dose in the long run-  Spasticity progresses up to 1-2 years.   4. Call me in 2-3 weeks- to let me know where you're at.    5.  Referral to Dr Ernestine Mcmurray- for CAUSE of spasticity- and see if there's anything else that can be done for shoulder.  D/w Dr Katrinka Blazing- will see him at 10:30 am.  [address]- [place], [place]- Dr Ernestine Mcmurray-   6.  Wait on nerve pain meds- for now- since think spasticity is the main issue   7. Has appointment with Urology- make sure they know have Cervical myelopathy, since affects bladder sometimes.    8. Caffeine can irritate neurogenic bladder.    9. F/U in 3 months- but call me to let me know how things working.    10. 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 But will let Dr Katrinka Blazing weigh in.

## 2023-07-02 ENCOUNTER — Other Ambulatory Visit (HOSPITAL_COMMUNITY): Payer: Self-pay

## 2023-07-02 ENCOUNTER — Other Ambulatory Visit: Payer: Self-pay | Admitting: Neurosurgery

## 2023-07-02 DIAGNOSIS — M792 Neuralgia and neuritis, unspecified: Secondary | ICD-10-CM

## 2023-07-02 DIAGNOSIS — M25512 Pain in left shoulder: Secondary | ICD-10-CM

## 2023-07-03 ENCOUNTER — Other Ambulatory Visit: Payer: Self-pay

## 2023-07-03 DIAGNOSIS — R202 Paresthesia of skin: Secondary | ICD-10-CM

## 2023-07-04 ENCOUNTER — Other Ambulatory Visit (HOSPITAL_COMMUNITY): Payer: Self-pay

## 2023-07-04 MED ORDER — TERBINAFINE HCL 250 MG PO TABS
250.0000 mg | ORAL_TABLET | Freq: Every day | ORAL | 3 refills | Status: AC
  Filled 2023-07-04: qty 30, 30d supply, fill #0
  Filled 2023-08-07: qty 30, 30d supply, fill #1
  Filled 2023-09-06: qty 30, 30d supply, fill #2

## 2023-07-05 ENCOUNTER — Ambulatory Visit: Payer: 59 | Admitting: Neurology

## 2023-07-05 DIAGNOSIS — M5412 Radiculopathy, cervical region: Secondary | ICD-10-CM

## 2023-07-05 DIAGNOSIS — R202 Paresthesia of skin: Secondary | ICD-10-CM

## 2023-07-05 DIAGNOSIS — G5622 Lesion of ulnar nerve, left upper limb: Secondary | ICD-10-CM

## 2023-07-05 NOTE — Procedures (Signed)
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  NCV & EMG Findings: Extensive electrodiagnostic testing of the left upper extremity and additional studies of the right shows:  Bilateral median, right ulnar, and bilateral radial sensory responses are within normal limits.  Left ulnar motor response shows long latency (L3.4 ms). Medial and lateral antebrachial cutaneous sensory responses are absent bilaterally. Bilateral median and right ulnar motor responses are within normal limits.  Left ulnar motor response shows prolonged latency (3.4 ms) and decreased conduction velocity (A Elbow-B Elbow, 37 m/s).  Chronic motor axonal loss changes are seen affecting the left ulnar innervated muscles as well as the pronator teres and triceps muscles.  There is no evidence of accompanying active denervation.  Of note, there is marked atrophy of the left infraspinatus and supraspinatus muscles with there is a variable pattern of motor unit recruitment and normal motor units; these findings may be due to central disorder of motor unit control, less likely pain or poor effort.   Impression: Left ulnar neuropathy with slowing across the elbow, demyelinating, moderate. Chronic C7 radiculopathy affecting the left upper extremity, mild. There is marked atrophy of the left infraspinatus and supraspinatus muscles along with incomplete motor unit recruitment and normal motor units; these findings may be due to central disorder of motor unit control, less likely pain or poor effort.  In particular, there is no evidence of a  left brachial plexopathy.     ___________________________ Nita Sickle, DO    Nerve Conduction Studies   Stim Site NR Peak (ms) Norm Peak (ms) O-P Amp (V) Norm O-P Amp  Left Lat Ante Brach Cutan Anti Sensory (Lat Forearm)  32 C  Lat Biceps *NR  <2.9  >12  Right Lat Ante Brach Cutan Anti Sensory (Lat Forearm)  32 C  Lat Biceps *NR  <2.9  >12  Left Med Ante Brach Cutan Anti Sensory (Med Forearm)  32 C  Elbow *NR      Right Med Ante Brach Cutan Anti Sensory (Med Forearm)  32 C  Elbow *NR      Left Median Anti Sensory (2nd Digit)  32 C  Wrist    3.2 <3.6 34.7 >15  Right Median Anti Sensory (2nd Digit)  32 C  Wrist    3.3 <3.6 28.8 >15  Left Radial Anti Sensory (Base 1st Digit)  32 C  Wrist    2.3 <2.7 30.1 >14  Right Radial Anti Sensory (Base 1st Digit)  32 C  Wrist    2.0 <2.7 32.2 >14  Left Ulnar Anti Sensory (5th Digit)  32 C  Wrist    *3.4 <3.1 27.4 >10  Right Ulnar Anti Sensory (5th Digit)  32 C  Wrist    3.1 <3.1 24.3 >10     Stim Site NR Onset (ms) Norm Onset (ms) O-P Amp (mV) Norm O-P Amp Site1 Site2 Delta-0 (ms) Dist (cm) Vel (m/s) Norm Vel (m/s)  Left Median Motor (Abd Poll Brev)  32 C  Wrist    3.2 <4.0 8.8 >6 Elbow Wrist 5.4 28.0 52 >50  Elbow    8.6  8.4         Right Median Motor (Abd Poll Brev)  32 C  Wrist    2.9 <4.0 7.0 >6 Elbow Wrist 5.9 33.0 56 >50  Elbow    8.8  6.4         Left Ulnar Motor (Abd Dig Minimi)  32 C  Wrist    *3.4 <3.1 10.2 >7 B Elbow Wrist 3.5 22.0 63 >50  B Elbow    6.9  9.6  A Elbow B Elbow 2.7 10.0 *37 >50  A Elbow    9.6  9.5         Right Ulnar Motor (Abd Dig Minimi)  32 C  Wrist    2.8 <3.1 10.3 >7 B Elbow Wrist 4.2 23.0 55 >50  B Elbow    7.0  10.1  A Elbow B Elbow 1.9 10.0 53 >50  A Elbow    8.9  9.3          Electromyography   Side Muscle Ins.Act Fibs Fasc Recrt Amp Dur Poly Activation Comment  Left 1stDorInt Nml Nml Nml *1- *1+ *1+ *1+ Nml N/A  Left Abd Poll Brev Nml Nml Nml Nml Nml Nml Nml Nml N/A  Left  ABD Dig Min Nml Nml Nml *1- *1+ *1+ *1+ Nml N/A  Left FlexPolLong Nml Nml Nml Nml Nml Nml Nml Nml N/A  Left Ext Indicis Nml Nml Nml Nml Nml Nml Nml Nml N/A  Left PronatorTeres Nml Nml Nml *1- *1+ *1+ *1+ Nml N/A  Left Biceps Nml Nml Nml Nml Nml Nml Nml Nml N/A  Left Triceps Nml Nml Nml *1- *1+ *1+ *1+ Nml N/A  Left Deltoid Nml Nml Nml Nml Nml Nml Nml Nml N/A  Left Infraspinatus Nml Nml Nml *2- Nml Nml Nml *Variable *ATR  Left FlexCarpiUln Nml Nml Nml *1- *1+ *1+ *1+ Nml N/A  Left Supraspinatus Nml Nml Nml *2- Nml Nml Nml *Variable *ATR      Waveforms:

## 2023-07-09 ENCOUNTER — Ambulatory Visit (INDEPENDENT_AMBULATORY_CARE_PROVIDER_SITE_OTHER): Payer: 59 | Admitting: Urology

## 2023-07-09 ENCOUNTER — Encounter: Payer: Self-pay | Admitting: Urology

## 2023-07-09 VITALS — BP 108/78 | HR 80 | Ht 72.0 in | Wt 186.0 lb

## 2023-07-09 DIAGNOSIS — R3915 Urgency of urination: Secondary | ICD-10-CM | POA: Diagnosis not present

## 2023-07-09 DIAGNOSIS — Z125 Encounter for screening for malignant neoplasm of prostate: Secondary | ICD-10-CM | POA: Diagnosis not present

## 2023-07-09 DIAGNOSIS — R350 Frequency of micturition: Secondary | ICD-10-CM

## 2023-07-09 DIAGNOSIS — R339 Retention of urine, unspecified: Secondary | ICD-10-CM

## 2023-07-09 DIAGNOSIS — R399 Unspecified symptoms and signs involving the genitourinary system: Secondary | ICD-10-CM | POA: Diagnosis not present

## 2023-07-09 LAB — BLADDER SCAN AMB NON-IMAGING

## 2023-07-09 NOTE — Progress Notes (Signed)
07/09/23 1:47 PM   Johnny Robinson [birth year] [id number]  CC: Urinary symptoms, PSA screening  HPI: 57 year old male referred for urinary symptoms and possible urinary retention.  He was seen by PCP in October 2020 for urinary frequency and sensation of incomplete emptying.  Urinalysis was benign, renal function normal, and he was referred to urology.  He was also trialed on Flomax at that time.  His symptoms resolved after a few weeks, he also had some lower abdominal pain at that time that he felt could be related to diverticulitis.  He denies any urinary complaints today and is off the Flomax.  PVR today is normal at 0 mL.  PSA from March 2024 normal at 0.73.  No recent cross-sectional imaging to review.  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   PMH: Past Medical History:  Diagnosis Date   Arthritis    Chronic kidney disease 2016   RENAL INFARCT   GERD (gastroesophageal reflux disease)    Hypertension    Reflux    Subdural hematoma (HCC) 1991    Surgical History: Past Surgical History:  Procedure Laterality Date   BACK SURGERY  11/2017   burr hole  1992   to evacuate SDH   COLONOSCOPY WITH PROPOFOL N/A 09/02/2018   Procedure: COLONOSCOPY WITH PROPOFOL;  Surgeon: Midge Minium, MD;  Location: [facility] ENDOSCOPY;  Service: Endoscopy;  Laterality: N/A;   EXCISION MASS ABDOMINAL Left 05/28/2018   Procedure: EXCISIONAL BIOPSY MASS ABDOMINAL WALL;  Surgeon: Carolan Shiver, MD;  Location: [facility] ORS;  Service: General;  Laterality: Left;   PERIPHERAL VASCULAR CATHETERIZATION Right 05/18/2015   Procedure: Renal Angiography;  Surgeon: Annice Needy, MD;  Location: [facility] INVASIVE CV LAB;  Service: Cardiovascular;  Laterality: Right;   PERIPHERAL VASCULAR CATHETERIZATION Bilateral 05/18/2015   Procedure: Renal Intervention;  Surgeon: Annice Needy, MD;  Location: [facility] INVASIVE CV LAB;  Service: Cardiovascular;  Laterality: Bilateral;   TOTAL HIP ARTHROPLASTY Left  03/07/2022   Procedure: TOTAL HIP ARTHROPLASTY ANTERIOR APPROACH;  Surgeon: Ollen Gross, MD;  Location: [facility] ORS;  Service: Orthopedics;  Laterality: Left;    Family History: Family History  Problem Relation Age of Onset   Nephrolithiasis Mother    Cancer Father     Social History:  reports that he has never smoked. He has never been exposed to tobacco smoke. He has never used smokeless tobacco. He reports current alcohol use. He reports that he does not use drugs.  Physical Exam: BP 108/78   Pulse 80   Ht 6' (1.829 m)   Wt 186 lb (84.4 kg)   BMI 25.23 kg/m    Constitutional:  Alert and oriented, No acute distress. Cardiovascular: No clubbing, cyanosis, or edema. Respiratory: Normal respiratory effort, no increased work of breathing. GI: Abdomen is soft, nontender, nondistended, no abdominal masses   Laboratory Data: Reviewed, see HPI   Assessment & Plan:   57 year old male with 1 to 2 weeks of temporary urinary symptoms with urgency and frequency that resolved spontaneously.  Urinalysis benign, PVR normal at 0 mL, PSA normal at 0.73.  We reviewed possible etiologies including cystitis, diverticulitis, IC, prostatitis,, BPH.  Reassurance provided regarding benign workup, and he prefers follow-up as needed.  Follow-up with urology as needed, return precautions were discussed  Legrand Rams, MD 07/09/2023  [facility] [address], [address] [place], [place] [address] [phone]

## 2023-07-15 ENCOUNTER — Other Ambulatory Visit (HOSPITAL_COMMUNITY): Payer: Self-pay

## 2023-07-31 ENCOUNTER — Ambulatory Visit: Payer: 59 | Admitting: Neurosurgery

## 2023-08-07 ENCOUNTER — Ambulatory Visit: Payer: 59 | Admitting: Neurosurgery

## 2023-08-07 ENCOUNTER — Ambulatory Visit
Admission: RE | Admit: 2023-08-07 | Discharge: 2023-08-07 | Disposition: A | Discharge status: Home / Self Care | Payer: 59 | Attending: Neurosurgery | Admitting: Neurosurgery

## 2023-08-07 ENCOUNTER — Other Ambulatory Visit (HOSPITAL_COMMUNITY): Payer: Self-pay

## 2023-08-07 ENCOUNTER — Ambulatory Visit
Admission: RE | Admit: 2023-08-07 | Discharge: 2023-08-07 | Disposition: A | Discharge status: Home / Self Care | Payer: 59 | Source: Ambulatory Visit | Attending: Neurosurgery | Admitting: Neurosurgery

## 2023-08-07 ENCOUNTER — Encounter: Payer: Self-pay | Admitting: Neurosurgery

## 2023-08-07 VITALS — BP 120/82 | Ht 72.0 in | Wt 184.0 lb

## 2023-08-07 DIAGNOSIS — M7502 Adhesive capsulitis of left shoulder: Secondary | ICD-10-CM

## 2023-08-07 DIAGNOSIS — Z981 Arthrodesis status: Secondary | ICD-10-CM | POA: Diagnosis present

## 2023-08-07 DIAGNOSIS — R29898 Other symptoms and signs involving the musculoskeletal system: Secondary | ICD-10-CM

## 2023-08-08 ENCOUNTER — Other Ambulatory Visit (HOSPITAL_COMMUNITY): Payer: Self-pay

## 2023-08-09 NOTE — Progress Notes (Signed)
Referring Physician:  Marguarite Arbour, MD [address] [facility] [place],  [place] [address]  Primary Physician:  Marguarite Arbour, MD  History of Present Illness: 08/07/2023 [name] is here today with a chief complaint of left upper extremity weakness and pain.  He has a complicated past medical history.  Previous had multiple spinal operations.  Underwent a anterior cervical discectomy and fusion and had a spinal contusion causing left-sided hemibody weakness.  Fortunately during his recovery he had a significant improvement in his hand left upper extremity and left lower extremity.  He regained the ability to ambulate.  He regained good function in his left upper extremity and was exercising in the gym, he still had notable deficits on the left side however felt like he was at least antigravity with some resistance.  Unfortunately he had a worsening of his symptoms that was correlated with a severe onset of left-sided shoulder pain and periscapular pain.  He feels like he was having some wasting of his left upper extremity in the proximal region, however is unsure whether or not this worsened significantly after the onset of pain.  His pain in the shoulder region is mostly with significant manipulation, however he does have some tenderness to light palpation as well representing possible dysesthesias.  INTERVAL HISTORY:  When we saw him last we were concerned about possible frozen shoulder syndrome so we made sure that he had a orthopedic follow-up, we also wanted him to have an updated EMG nerve conduction study to evaluate for any potential brachial plexopathy given his muscle wasting.  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  States that he has noticed somewhat of an improvement in his range of motion after the injections.  Continues to have severe weakness with external rotation.  He is gotten  the EMG which will be outlined below but showed mostly cord level injury and no clear evidence of a brachial plexopathy or worsening radiculopathy.   Review of Systems:  A 10 point review of systems is negative, except for the pertinent positives and negatives detailed in the HPI.  Past Medical History: Past Medical History:  Diagnosis Date   Arthritis    Chronic kidney disease 2016   RENAL INFARCT   GERD (gastroesophageal reflux disease)    Hypertension    Reflux    Subdural hematoma (HCC) 1991    Past Surgical History: Past Surgical History:  Procedure Laterality Date   BACK SURGERY  11/2017   burr hole  1992   to evacuate SDH   COLONOSCOPY WITH PROPOFOL N/A 09/02/2018   Procedure: COLONOSCOPY WITH PROPOFOL;  Surgeon: Midge Minium, MD;  Location[facility] ENDOSCOPY;  Service: Endoscopy;  Laterality: N/A;   EXCISION MASS ABDOMINAL Left 05/28/2018   Procedure: EXCISIONAL BIOPSY MASS ABDOMINAL WALL;  Surgeon: Carolan Shiver, MD;  Location: [facility] ORS;  Service: General;  Laterality: Left;   PERIPHERAL VASCULAR CATHETERIZATION Right 05/18/2015   Procedure: Renal Angiography;  Surgeon: Annice Needy, MD;  Location: [facility] INVASIVE CV LAB;  Service: Cardiovascular;  Laterality: Right;   PERIPHERAL VASCULAR CATHETERIZATION Bilateral 05/18/2015   Procedure: Renal Intervention;  Surgeon: Annice Needy, MD;  Location: [facility] INVASIVE CV LAB;  Service: Cardiovascular;  Laterality: Bilateral;   TOTAL HIP ARTHROPLASTY Left 03/07/2022   Procedure: TOTAL HIP ARTHROPLASTY ANTERIOR APPROACH;  Surgeon: Ollen Gross, MD;  Location: [facility] ORS;  Service: Orthopedics;  Laterality: Left;    Allergies: Allergies as of 08/07/2023   (No Known Allergies)    Medications:  Current Outpatient Medications:    aspirin EC 81 MG tablet, Take 81 mg by mouth daily., Disp: , Rfl:    atorvastatin (LIPITOR) 20 MG tablet, Take 1 tablet (20 mg total) by mouth daily., Disp: 90 tablet, Rfl: 3   baclofen (LIORESAL) 10  MG tablet, Take 1 tablet (10 mg total) by mouth 3 (three) times daily., Disp: 90 each, Rfl: 5   cyclobenzaprine (FLEXERIL) 5 MG tablet, Take 1 tablet (5 mg total) by mouth 3 (three) times daily as needed for muscle spasms., Disp: 30 tablet, Rfl: 0   diclofenac Sodium (VOLTAREN) 1 % GEL, Apply 4 g topically 4 (four) times daily., Disp: 400 g, Rfl: 0   gabapentin (NEURONTIN) 300 MG capsule, Take 1 capsule (300 mg total) by mouth 3 (three) times daily., Disp: 90 capsule, Rfl: 5   metoprolol succinate (TOPROL-XL) 25 MG 24 hr tablet, Take 1 tablet (25 mg total) by mouth once daily, Disp: 90 tablet, Rfl: 3   Multiple Vitamin (MULTIVITAMIN WITH MINERALS) TABS tablet, Take 1 tablet by mouth in the morning., Disp: , Rfl:    omeprazole (PRILOSEC) 40 MG capsule, Take 1 capsule (40 mg total) by mouth once daily, Disp: 90 capsule, Rfl: 3   terbinafine (LAMISIL) 250 MG tablet, Take 1 tablet (250 mg total) by mouth daily., Disp: 30 tablet, Rfl: 3  Social History: Social History   Tobacco Use   Smoking status: Never    Passive exposure: Never   Smokeless tobacco: Never  Vaping Use   Vaping status: Never Used  Substance Use Topics   Alcohol use: Yes    Comment: occasional   Drug use: No    Family Medical History: Family History  Problem Relation Age of Onset   Nephrolithiasis Mother    Cancer Father     Physical Examination: Vitals:   08/07/23 1316  BP: 120/82    General: Patient is in no apparent distress. Attention to examination is appropriate.  Neck:   Supple.  Incision CDI  Respiratory: Patient is breathing without any difficulty.   NEUROLOGICAL:     Awake, alert, oriented to person, place, and time.  Speech is clear and fluent.   Cranial Nerves: Pupils equal round and reactive to light.  Facial tone is symmetric. Shoulder shrug is symmetric. Tongue protrusion is midline.  There is no pronator drift.  Motor Exam:  Subjectively and observationally his shoulder function does  appear to be somewhat improved, he still lacks range of motion, however appears to have slightly improved range of motion when compared to previously.  He continues to have almost no external rotation noted.  Notable hyperreflexia in the left upper extremity with pathologic reflexes.  Decreased proprioception and light touch sensation on the left upper extremity, decreased pain and temperature sensation in the right upper extremity.  Gait is normal.   Medical Decision Making  Imaging:  No new imaging on this visit    Electrodiagnostics:  [facility]  [address] 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   NCV & EMG Findings: Extensive electrodiagnostic testing of the left upper extremity and additional studies of the right shows:  Bilateral median, right ulnar, and bilateral radial sensory responses are within normal limits.  Left ulnar motor response shows long latency (L3.4 ms). Medial and lateral antebrachial cutaneous sensory responses are absent bilaterally. Bilateral median and right ulnar motor responses are within normal limits.  Left ulnar motor response shows prolonged latency (3.4 ms) and decreased conduction velocity (A Elbow-B Elbow, 37 m/s).  Chronic motor axonal loss changes are seen affecting the left ulnar innervated muscles as well as the pronator teres and triceps muscles.  There is no evidence of accompanying active denervation.  Of note, there is marked atrophy of the left infraspinatus and supraspinatus muscles with there is a variable pattern of motor unit recruitment and normal  motor units; these findings may be due to central disorder of motor unit control, less likely pain or poor effort.    Impression: Left ulnar neuropathy with slowing across the elbow, demyelinating, moderate. Chronic C7 radiculopathy affecting the left upper extremity, mild. There is marked atrophy of the left infraspinatus and supraspinatus muscles along with incomplete motor unit recruitment and normal motor units; these findings may be due to central disorder of motor unit control, less likely pain or poor effort.  In particular, there is no evidence of a left brachial plexopathy.       ___________________________ Nita Sickle, DO    I have personally reviewed the images and electrodiagnostics and agree with the above interpretation.  Assessment and Plan: Mr. Noto is a pleasant 57 y.o. male with a rather complex history.  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  He states that immediately after his surgery he was having profound left-sided weakness consistent with the hemicord, thankfully he had significant improvement in his left lower extremity function as well as his left upper extremity function.  He noted that he was getting multiple gains back and was able to do some resistance training in the gym.  He felt like he was antigravity in his shoulder but then developed a severe onset of left upper extremity pain mostly in the periscapular and shoulder region followed by worsening weakness.    Given his history we are concerned for possible brachial plexopathy versus frozen shoulder syndrome.  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  He is also being set up for frozen shoulder physical therapy.  We are also worried about possible brachial plexopathy so had him evaluated by our neurology team who noticed mostly that  these symptoms were likely coming from his cervical cord syndrome which is previously known and diagnosed.  The combination of his cervical cord injury and frozen shoulder are likely the main causes of his weakness at this point.  At this point there is no indication for nerve transfer surgery or brachial plexopathy surgery.  Will continue to follow.  He like to follow-up with [place] for his cervical care.  Will plan on seeing him again.  We spent 30 minutes in direct patient care, going over his imaging and electrodiagnostics as outlined above, and discussing his plan moving forward including physical therapy, observation, and continued observation for his neck pain.  Given his previous cervical decompression there is chance that he is having some adjacent  segment disease causing some posterior headaches or cervicogenic headaches.  These can sometimes improve with cervical injections.  Will plan on evaluating his neck with flexion-extension x-rays to evaluate whether or not there is any instability noted.  Lovenia Kim MD/MSCR Neurosurgery - Peripheral Nerve Surgery

## 2023-08-11 ENCOUNTER — Encounter: Payer: Self-pay | Admitting: Physical Medicine and Rehabilitation

## 2023-08-12 MED ORDER — SILDENAFIL CITRATE 100 MG PO TABS: 100.0000 mg | ORAL_TABLET | Freq: Every day | ORAL | 5 refills | Status: AC | PRN

## 2023-08-12 NOTE — Addendum Note (Signed)
Addended by: Genice Rouge on: 08/12/2023 03:26 PM   Modules accepted: Orders

## 2023-08-23 ENCOUNTER — Other Ambulatory Visit (HOSPITAL_COMMUNITY): Payer: Self-pay

## 2023-09-04 ENCOUNTER — Encounter: Payer: Self-pay | Admitting: Neurosurgery

## 2023-09-04 DIAGNOSIS — Z981 Arthrodesis status: Secondary | ICD-10-CM

## 2023-09-09 ENCOUNTER — Other Ambulatory Visit (HOSPITAL_COMMUNITY): Payer: Self-pay

## 2023-09-10 ENCOUNTER — Other Ambulatory Visit (HOSPITAL_COMMUNITY): Payer: Self-pay

## 2023-09-30 ENCOUNTER — Other Ambulatory Visit (HOSPITAL_COMMUNITY): Payer: Self-pay

## 2023-10-02 ENCOUNTER — Encounter: Payer: 59 | Attending: Physical Medicine and Rehabilitation | Admitting: Physical Medicine and Rehabilitation

## 2023-10-02 DIAGNOSIS — R252 Cramp and spasm: Secondary | ICD-10-CM | POA: Insufficient documentation

## 2023-10-02 DIAGNOSIS — G959 Disease of spinal cord, unspecified: Secondary | ICD-10-CM | POA: Insufficient documentation

## 2023-10-02 DIAGNOSIS — N319 Neuromuscular dysfunction of bladder, unspecified: Secondary | ICD-10-CM | POA: Insufficient documentation

## 2023-10-09 ENCOUNTER — Other Ambulatory Visit: Payer: Self-pay | Admitting: Physical Medicine and Rehabilitation

## 2023-10-11 ENCOUNTER — Other Ambulatory Visit (HOSPITAL_COMMUNITY): Payer: Self-pay

## 2023-10-11 MED ORDER — TERBINAFINE HCL 250 MG PO TABS
250.0000 mg | ORAL_TABLET | Freq: Every day | ORAL | 3 refills | Status: DC
  Filled 2023-10-11: qty 30, 30d supply, fill #0
  Filled 2023-11-11: qty 30, 30d supply, fill #1
  Filled 2023-12-17: qty 30, 30d supply, fill #2
  Filled 2024-01-13: qty 30, 30d supply, fill #3

## 2023-10-15 ENCOUNTER — Other Ambulatory Visit (HOSPITAL_COMMUNITY): Payer: Self-pay

## 2023-10-15 MED ORDER — GABAPENTIN 300 MG PO CAPS
300.0000 mg | ORAL_CAPSULE | Freq: Three times a day (TID) | ORAL | 5 refills | Status: DC
  Filled 2023-10-15: qty 90, 30d supply, fill #0
  Filled 2023-11-22: qty 90, 30d supply, fill #1
  Filled 2023-12-17: qty 90, 30d supply, fill #2
  Filled 2024-01-23: qty 90, 30d supply, fill #3
  Filled 2024-03-01: qty 90, 30d supply, fill #4
  Filled 2024-03-29: qty 90, 30d supply, fill #5

## 2023-10-29 ENCOUNTER — Other Ambulatory Visit (HOSPITAL_COMMUNITY): Payer: Self-pay

## 2023-11-08 ENCOUNTER — Encounter: Payer: 59 | Attending: Physical Medicine and Rehabilitation | Admitting: Physical Medicine and Rehabilitation

## 2023-11-08 ENCOUNTER — Encounter: Payer: Self-pay | Admitting: Physical Medicine and Rehabilitation

## 2023-11-08 ENCOUNTER — Other Ambulatory Visit (HOSPITAL_COMMUNITY): Payer: Self-pay

## 2023-11-08 VITALS — BP 121/78 | HR 58 | Ht 72.0 in | Wt 188.0 lb

## 2023-11-08 DIAGNOSIS — M25512 Pain in left shoulder: Secondary | ICD-10-CM | POA: Insufficient documentation

## 2023-11-08 DIAGNOSIS — G959 Disease of spinal cord, unspecified: Secondary | ICD-10-CM | POA: Diagnosis not present

## 2023-11-08 DIAGNOSIS — R252 Cramp and spasm: Secondary | ICD-10-CM | POA: Insufficient documentation

## 2023-11-08 DIAGNOSIS — M7502 Adhesive capsulitis of left shoulder: Secondary | ICD-10-CM | POA: Diagnosis present

## 2023-11-08 DIAGNOSIS — G894 Chronic pain syndrome: Secondary | ICD-10-CM | POA: Insufficient documentation

## 2023-11-08 MED ORDER — TRAMADOL HCL 50 MG PO TABS
50.0000 mg | ORAL_TABLET | Freq: Two times a day (BID) | ORAL | 0 refills | Status: DC | PRN
  Filled 2023-11-08: qty 14, 7d supply, fill #0

## 2023-11-08 NOTE — Progress Notes (Signed)
Subjective:    Patient ID: Johnny Robinson, male    DOB: 1966-05-18, 58 y.o.   MRN: [id number]  HPI Pt is a 58 year old male with Cervical myelopathy and L sided weakness s/p C3-6 ACDF; myofascial pain s/p trigger point injections, L shoulder pain- prior multifocal pneumonia-  Here for  f/u on Cervical myelopathy with LUE weakness and L shoulder pain.   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   HA's have gone away.  Still having L shoulder pain- was in PT this AM.   Doesn't have time for PT- scheduling PT- to not interfere with work.    Brother has frozen shoulder and underwent  anesthesia and Was successful.   Can get shoulder to abduct past 75- 80 degrees Doesn't use much, since not helpful and so that gets worse.     A little sore today, because doing PT more- won't get bette ron it's own .  And did PT this AM.   Is taking baclofen- 1x/day- has tried 3x/day- doesn't work- only in AM. Couldn't take at night- because routine isn't predictable.   Not taking Flomax- back to normal.  Labs normal        Pain Inventory Average Pain 6 Pain Right Now 5 My pain is intermittent, constant, sharp, burning, dull, stabbing, tingling, and aching  In the last 24 hours, has pain interfered with the following? General activity 3 Relation with others 0 Enjoyment of life 0 What TIME of day is your pain at its worst? night Sleep (in general) Fair  Pain is worse with: inactivity Pain improves with:  moving Relief from Meds:  none taken  Family History  Problem Relation Age of Onset   Nephrolithiasis Mother    Cancer Father    Social History   Socioeconomic History   Marital status: Married    Spouse name: Carollee Herter   Number of children: 3   Years of education: JD   Highest education level: Professional school degree (e.g., MD, DDS, DVM, JD)  Occupational History   Occupation: Clinical research associate   Tobacco Use   Smoking status: Never    Passive exposure: Never   Smokeless tobacco: Never  Vaping Use   Vaping status: Never Used  Substance and Sexual Activity   Alcohol use: Yes    Comment: occasional   Drug use: No   Sexual activity: Yes    Birth control/protection: None  Other Topics Concern   Not on file  Social History Narrative   Not on file   Social Drivers of Health   Financial Resource Strain: Low Risk  (07/04/2023)   Received from [facility]   Overall Financial Resource Strain (CARDIA)    Difficulty of Paying Living Expenses: Not hard at all  Food Insecurity: No Food Insecurity (07/04/2023)   Received from [facility]   Hunger Vital Sign    Worried About Running Out of Food in the Last Year: Never true    Ran Out of Food in the Last Year: Never true  Transportation Needs: No Transportation Needs (07/04/2023)   Received from [facility] - Transportation    In the past 12 months, has lack of transportation kept you from medical appointments or from getting medications?: No    Lack of Transportation (Non-Medical): No  Physical Activity: Not on file  Stress: Not on file  Social Connections:  Not on file   Past Surgical History:  Procedure Laterality Date   BACK SURGERY  11/2017   burr hole  1992   to evacuate SDH   COLONOSCOPY WITH PROPOFOL N/A 09/02/2018   Procedure: COLONOSCOPY WITH PROPOFOL;  Surgeon: Midge Minium, MD;  Location: [facility] ENDOSCOPY;  Service: Endoscopy;  Laterality: N/A;   EXCISION MASS ABDOMINAL Left 05/28/2018   Procedure: EXCISIONAL BIOPSY MASS ABDOMINAL WALL;  Surgeon: Carolan Shiver, MD;  Location: [facility] ORS;  Service: General;  Laterality: Left;   PERIPHERAL VASCULAR CATHETERIZATION Right 05/18/2015   Procedure: Renal Angiography;  Surgeon: Annice Needy, MD;  Location: [facility] INVASIVE CV LAB;  Service: Cardiovascular;  Laterality: Right;   PERIPHERAL VASCULAR  CATHETERIZATION Bilateral 05/18/2015   Procedure: Renal Intervention;  Surgeon: Annice Needy, MD;  Location: [facility] INVASIVE CV LAB;  Service: Cardiovascular;  Laterality: Bilateral;   TOTAL HIP ARTHROPLASTY Left 03/07/2022   Procedure: TOTAL HIP ARTHROPLASTY ANTERIOR APPROACH;  Surgeon: Ollen Gross, MD;  Location: [facility] ORS;  Service: Orthopedics;  Laterality: Left;   Past Surgical History:  Procedure Laterality Date   BACK SURGERY  11/2017   burr hole  1992   to evacuate [facility]   COLONOSCOPY WITH PROPOFOL N/A 09/02/2018   Procedure: COLONOSCOPY WITH PROPOFOL;  Surgeon: Midge Minium, MD;  Location: [facility] ENDOSCOPY;  Service: Endoscopy;  Laterality: N/A;   EXCISION MASS ABDOMINAL Left 05/28/2018   Procedure: EXCISIONAL BIOPSY MASS ABDOMINAL WALL;  Surgeon: Carolan Shiver, MD;  Location: [facility] ORS;  Service: General;  Laterality: Left;   PERIPHERAL VASCULAR CATHETERIZATION Right 05/18/2015   Procedure: Renal Angiography;  Surgeon: Annice Needy, MD;  Location: [facility] INVASIVE CV LAB;  Service: Cardiovascular;  Laterality: Right;   PERIPHERAL VASCULAR CATHETERIZATION Bilateral 05/18/2015   Procedure: Renal Intervention;  Surgeon: Annice Needy, MD;  Location: [facility] INVASIVE CV LAB;  Service: Cardiovascular;  Laterality: Bilateral;   TOTAL HIP ARTHROPLASTY Left 03/07/2022   Procedure: TOTAL HIP ARTHROPLASTY ANTERIOR APPROACH;  Surgeon: Ollen Gross, MD;  Location: [facility] ORS;  Service: Orthopedics;  Laterality: Left;   Past Medical History:  Diagnosis Date   Arthritis    Chronic kidney disease 2016   RENAL INFARCT   GERD (gastroesophageal reflux disease)    Hypertension    Reflux    Subdural hematoma (HCC) 1991   There were no vitals taken for this visit.  Opioid Risk Score:   Fall Risk Score:  `1  Depression screen [facility] 2/9     07/01/2023    9:26 AM 04/01/2023   10:30 AM 12/31/2022   10:43 AM  Depression screen PHQ 2/9  Decreased Interest 0 0 0  Down, Depressed, Hopeless 0 0 0  PHQ - 2  Score 0 0 0  Altered sleeping   0  Tired, decreased energy   0  Change in appetite   0  Feeling bad or failure about yourself    0  Trouble concentrating   0  Moving slowly or fidgety/restless   0  Suicidal thoughts   0  PHQ-9 Score   0    Review of Systems  Musculoskeletal:  Positive for neck pain.       Left shoulder pain  All other systems reviewed and are negative.      Objective:   Physical Exam  Awake, alert, appropriate, no assistive device, NAD  Has some give in shoulder on L Definite Hoffman's on LUE and MAS of 1 to 1+ in L wrist and L elbow.  Lacking a  LOT of external rotation- has ~ 30 degrees at most.  MSK:  LUE- deltoid 3+/5; biceps, triceps 4/5- WE 4+/5 and grip 4/5 and FA 4+/5      Assessment & Plan:   Pt is a 58 year old male with Cervical myelopathy and L sided weakness s/p C3-6 ACDF; myofascial pain s/p trigger point injections, L shoulder pain- prior multifocal pneumonia-  Here for  f/u on Cervical myelopathy with LUE weakness and L shoulder pain.    Interested in manipulation under anesthesia-  we discussed some risks and benefits.    2.  Try Baclofen 15 mg for 1-2 days and can increase to 20 mg for daily- to see if first 8 hours of the day is better than rest of day.     3. Strain- counterstrain- for range of motion-  1-2x/day- try with wife-    4. Will try tramadol-  50 mg up to 2x/day as needed for therapies and ROM- # 14-   5. Need to call in 1 week to let me how it works for PT- and ROM and then we can decide if we will continue it.    6.  Doesn't need refill on gabapentin- last filled 10/15/23. Only taking 1x/day-  might change to night time.    7. Discussed gating theory and more signals get to brain at night.    8. F/U in 3 months- check with Ortho-    I spent a total of 34   minutes on total care today- >50% coordination of care- due to pt about spasticity and nerve pain and baclofen and frozen shoulder-

## 2023-11-08 NOTE — Patient Instructions (Signed)
Pt is a 57 year old male with Cervical myelopathy and L sided weakness s/p C3-6 ACDF; myofascial pain s/p trigger point injections, L shoulder pain- prior multifocal pneumonia-  Here for  f/u on Cervical myelopathy with LUE weakness and L shoulder pain.    Interested in manipulation under anesthesia-  we discussed some risks and benefits.    2.  Try Baclofen 15 mg for 1-2 days and can increase to 20 mg for daily- to see if first 8 hours of the day is better than rest of day.     3. Strain- counterstrain- for range of motion-  1-2x/day- try with wife-    4. Will try tramadol-  50 mg up to 2x/day as needed for therapies and ROM- # 14-   5. Need to call in 1 week to let me how it works for PT- and ROM and then we can decide if we will continue it.    6.  Doesn't need refill on gabapentin- last filled 10/15/23. Only taking 1x/day-  might change to night time.    7. Discussed gating theory and more signals get to brain at night.    8. F/U in 3 months- check with Ortho-

## 2023-11-11 ENCOUNTER — Other Ambulatory Visit (HOSPITAL_COMMUNITY): Payer: Self-pay

## 2023-11-22 ENCOUNTER — Other Ambulatory Visit (HOSPITAL_COMMUNITY): Payer: Self-pay

## 2023-11-22 ENCOUNTER — Encounter: Payer: Self-pay | Admitting: Physical Medicine and Rehabilitation

## 2023-12-17 ENCOUNTER — Other Ambulatory Visit: Payer: Self-pay | Admitting: Physical Medicine and Rehabilitation

## 2023-12-18 ENCOUNTER — Other Ambulatory Visit: Payer: Self-pay

## 2023-12-19 ENCOUNTER — Other Ambulatory Visit (HOSPITAL_COMMUNITY): Payer: Self-pay

## 2023-12-19 MED ORDER — TRAMADOL HCL 50 MG PO TABS
50.0000 mg | ORAL_TABLET | Freq: Two times a day (BID) | ORAL | 1 refills | Status: DC | PRN
Start: 2023-12-19 — End: 2024-05-20
  Filled 2023-12-19: qty 60, 30d supply, fill #0

## 2023-12-28 ENCOUNTER — Other Ambulatory Visit (HOSPITAL_COMMUNITY): Payer: Self-pay

## 2024-01-13 ENCOUNTER — Other Ambulatory Visit (HOSPITAL_COMMUNITY): Payer: Self-pay

## 2024-01-13 MED ORDER — ATORVASTATIN CALCIUM 20 MG PO TABS
20.0000 mg | ORAL_TABLET | Freq: Every day | ORAL | 3 refills | Status: AC
  Filled 2024-01-13: qty 30, 30d supply, fill #0
  Filled 2024-02-12: qty 30, 30d supply, fill #1
  Filled 2024-03-22: qty 30, 30d supply, fill #2
  Filled 2024-04-20: qty 30, 30d supply, fill #3

## 2024-01-17 ENCOUNTER — Other Ambulatory Visit (HOSPITAL_COMMUNITY): Payer: Self-pay

## 2024-01-17 MED ORDER — METOPROLOL SUCCINATE ER 25 MG PO TB24
25.0000 mg | ORAL_TABLET | Freq: Every day | ORAL | 3 refills | Status: AC
  Filled 2024-01-17 – 2024-04-20 (×2): qty 30, 30d supply, fill #0
  Filled 2024-05-25: qty 30, 30d supply, fill #1
  Filled 2024-06-24: qty 30, 30d supply, fill #2

## 2024-01-17 MED ORDER — OMEPRAZOLE 40 MG PO CPDR
40.0000 mg | DELAYED_RELEASE_CAPSULE | Freq: Every day | ORAL | 3 refills | Status: AC
  Filled 2024-01-17 – 2024-01-23 (×2): qty 30, 30d supply, fill #0

## 2024-01-23 ENCOUNTER — Other Ambulatory Visit (HOSPITAL_COMMUNITY): Payer: Self-pay

## 2024-01-23 ENCOUNTER — Other Ambulatory Visit: Payer: Self-pay

## 2024-02-05 ENCOUNTER — Encounter: Payer: 59 | Attending: Physical Medicine and Rehabilitation | Admitting: Physical Medicine and Rehabilitation

## 2024-02-05 ENCOUNTER — Encounter: Payer: Self-pay | Admitting: Physical Medicine and Rehabilitation

## 2024-02-05 ENCOUNTER — Other Ambulatory Visit (HOSPITAL_COMMUNITY): Payer: Self-pay

## 2024-02-05 VITALS — BP 107/70 | HR 78 | Ht 72.0 in | Wt 185.0 lb

## 2024-02-05 DIAGNOSIS — R252 Cramp and spasm: Secondary | ICD-10-CM | POA: Insufficient documentation

## 2024-02-05 DIAGNOSIS — M7502 Adhesive capsulitis of left shoulder: Secondary | ICD-10-CM | POA: Insufficient documentation

## 2024-02-05 DIAGNOSIS — M792 Neuralgia and neuritis, unspecified: Secondary | ICD-10-CM | POA: Diagnosis present

## 2024-02-05 DIAGNOSIS — G959 Disease of spinal cord, unspecified: Secondary | ICD-10-CM | POA: Insufficient documentation

## 2024-02-05 MED ORDER — BACLOFEN 20 MG PO TABS
20.0000 mg | ORAL_TABLET | Freq: Two times a day (BID) | ORAL | 1 refills | Status: AC
  Filled 2024-02-05: qty 60, 30d supply, fill #0
  Filled 2024-03-29: qty 60, 30d supply, fill #1
  Filled 2024-06-05: qty 60, 30d supply, fill #2
  Filled 2024-08-11: qty 60, 30d supply, fill #3
  Filled 2024-10-03: qty 60, 30d supply, fill #4

## 2024-02-05 NOTE — Patient Instructions (Signed)
 Pt is a 58 year old male with Cervical myelopathy and L sided weakness s/p C3-6 ACDF; myofascial pain s/p trigger point injections, L shoulder pain- prior multifocal pneumonia-  Here for  f/u on Cervical myelopathy with LUE weakness and L shoulder pain.  Call Dr Alfredo Ano- and tell then you are interested in doing surgical intervention to relax Frozen shoulder.   2. Can change Rx for Baclofen  to to take 20 mg 2x/day- - changed from 10 to 20 mg tab-    3. Con't Tramadol  as needed- but has plenty- since only takes max 2x/week Doesn't need refills right now   4. Con't Gabapentin - in AM- 300 mg-  working well- doesn't need refills- has 2 refills left   5.  Fatigue easier, and then drags more- on L side- if oyu go on hike, take a hiking pole.    6.  F/U in 3 months- have to do q3 months- since getting tramadol .

## 2024-02-05 NOTE — Progress Notes (Signed)
 Subjective:    Patient ID: Johnny Robinson, male    DOB: 08-29-1966, 58 y.o.   MRN: [id number]  HPI Pt is a 58 year old male with Cervical myelopathy and L sided weakness s/p C3-6 ACDF; myofascial pain s/p trigger point injections, L shoulder pain- prior multifocal pneumonia-  Here for  f/u on Cervical myelopathy with LUE weakness and L shoulder pain.   Things pretty good- hasn't been to PT in a few weeks- has been so busy.   Clothespin and moves them up the rack-  can put on top rack at the end, but starts out at shoulder height.   More concerning- dragging the L foot-  Fell 3x- since saw me last. - didn't get "hurt" Got xray of R hand- pulled something  3rd digit on R hand- was 1 month ago- has full ROM- firm handshake hurts.   If have things in hands, taking elevator now-  L foot will be caught if turns on carpet- occurs in courtroom- has to be real thoughtful about it.    Having "frog" hopping- of LUE- spasms in LUE- constant at night.  When gets cold, and L hand turns into a claw- and couldn't fix it for a few hours.   Scheduling issue for therapy or even for doctor appointments- - interested in surgery.    Baclofen   20 mg in AM- - but falls asleep in chair, so hard ot take at night.   Gets a lot of swelling in LLE- when stands a lot- L foot gets cold, feels cold- so wears socks to sleep- works well   Was using tramadol - before went to PT-  for more painful therapies.  Felt like it helped.  Still has a lot of Tramadol - since only takes max 2x/week   When gets tired, Foot turns in and drags more.    Pain Inventory Average Pain 2 Pain Right Now 2 My pain is stabbing and tingling  In the last 24 hours, has pain interfered with the following? General activity 1 Relation with others 0 Enjoyment of life 1 What TIME of day is your pain at its worst? night Sleep (in general) Good  Pain is worse with: standing Pain improves with: No pain relief, constant pain per  patient Relief from Meds: 4  Family History  Problem Relation Age of Onset   Nephrolithiasis Mother    Cancer Father    Social History   Socioeconomic History   Marital status: Married    Spouse name: Cathleen Coach   Number of children: 3   Years of education: JD   Highest education level: Professional school degree (e.g., MD, DDS, DVM, JD)  Occupational History   Occupation: Clinical research associate  Tobacco Use   Smoking status: Never    Passive exposure: Never   Smokeless tobacco: Never  Vaping Use   Vaping status: Never Used  Substance and Sexual Activity   Alcohol use: Yes    Comment: occasional   Drug use: No   Sexual activity: Yes    Birth control/protection: None  Other Topics Concern   Not on file  Social History Narrative   Not on file   Social Drivers of Health   Financial Resource Strain: Low Risk  (07/04/2023)   Received from [facility]   Overall Financial Resource Strain (CARDIA)    Difficulty of Paying Living Expenses: Not hard at all  Food Insecurity: No Food Insecurity (07/04/2023)   Received from [facility]  Hunger Vital Sign    Worried About Running Out of Food in the Last Year: Never true    Ran Out of Food in the Last Year: Never true  Transportation Needs: No Transportation Needs (07/04/2023)   Received from [facility] - Transportation    In the past 12 months, has lack of transportation kept you from medical appointments or from getting medications?: No    Lack of Transportation (Non-Medical): No  Physical Activity: Not on file  Stress: Not on file  Social Connections: Not on file   Past Surgical History:  Procedure Laterality Date   BACK SURGERY  11/2017   burr hole  1992   to evacuate SDH   COLONOSCOPY WITH PROPOFOL  N/A 09/02/2018   Procedure: COLONOSCOPY WITH PROPOFOL ;  Surgeon: Marnee Sink, MD;  Location: [facility] ENDOSCOPY;  Service: Endoscopy;  Laterality: N/A;   EXCISION MASS ABDOMINAL  Left 05/28/2018   Procedure: EXCISIONAL BIOPSY MASS ABDOMINAL WALL;  Surgeon: Eldred Grego, MD;  Location: [facility] ORS;  Service: General;  Laterality: Left;   PERIPHERAL VASCULAR CATHETERIZATION Right 05/18/2015   Procedure: Renal Angiography;  Surgeon: Celso College, MD;  Location: [facility] INVASIVE CV LAB;  Service: Cardiovascular;  Laterality: Right;   PERIPHERAL VASCULAR CATHETERIZATION Bilateral 05/18/2015   Procedure: Renal Intervention;  Surgeon: Celso College, MD;  Location: [facility] INVASIVE CV LAB;  Service: Cardiovascular;  Laterality: Bilateral;   TOTAL HIP ARTHROPLASTY Left 03/07/2022   Procedure: TOTAL HIP ARTHROPLASTY ANTERIOR APPROACH;  Surgeon: Liliane Rei, MD;  Location: [facility] ORS;  Service: Orthopedics;  Laterality: Left;   Past Surgical History:  Procedure Laterality Date   BACK SURGERY  11/2017   burr hole  1992   to evacuate SDH   COLONOSCOPY WITH PROPOFOL  N/A 09/02/2018   Procedure: COLONOSCOPY WITH PROPOFOL ;  Surgeon: Marnee Sink, MD;  Location: [facility] ENDOSCOPY;  Service: Endoscopy;  Laterality: N/A;   EXCISION MASS ABDOMINAL Left 05/28/2018   Procedure: EXCISIONAL BIOPSY MASS ABDOMINAL WALL;  Surgeon: Eldred Grego, MD;  Location: [facility] ORS;  Service: General;  Laterality: Left;   PERIPHERAL VASCULAR CATHETERIZATION Right 05/18/2015   Procedure: Renal Angiography;  Surgeon: Celso College, MD;  Location: [facility] INVASIVE CV LAB;  Service: Cardiovascular;  Laterality: Right;   PERIPHERAL VASCULAR CATHETERIZATION Bilateral 05/18/2015   Procedure: Renal Intervention;  Surgeon: Celso College, MD;  Location: [facility] INVASIVE CV LAB;  Service: Cardiovascular;  Laterality: Bilateral;   TOTAL HIP ARTHROPLASTY Left 03/07/2022   Procedure: TOTAL HIP ARTHROPLASTY ANTERIOR APPROACH;  Surgeon: Liliane Rei, MD;  Location: [facility] ORS;  Service: Orthopedics;  Laterality: Left;   Past Medical History:  Diagnosis Date   Arthritis    Chronic kidney disease 2016   RENAL INFARCT   GERD  (gastroesophageal reflux disease)    Hypertension    Reflux    Subdural hematoma (HCC) 1991   BP 107/70 (BP Location: Left Arm, Patient Position: Sitting, Cuff Size: Normal)   Pulse 78   Ht 6' (1.829 m)   Wt 185 lb (83.9 kg)   SpO2 96%   BMI 25.09 kg/m   Opioid Risk Score:   Fall Risk Score:  `1  Depression screen [facility] 2/9     02/05/2024    9:50 AM 11/08/2023    9:45 AM 07/01/2023    9:26 AM 04/01/2023   10:30 AM 12/31/2022   10:43 AM  Depression screen PHQ 2/9  Decreased Interest 0 0 0 0 0  Down, Depressed, Hopeless 0 0 0  0 0  PHQ - 2 Score 0 0 0 0 0  Altered sleeping 0    0  Tired, decreased energy 0    0  Change in appetite 0    0  Feeling bad or failure about yourself  0    0  Trouble concentrating 0    0  Moving slowly or fidgety/restless 0    0  Suicidal thoughts 0    0  PHQ-9 Score 0    0    Review of Systems  Musculoskeletal:  Positive for joint swelling and myalgias.       Left shoulder and left foot pain  All other systems reviewed and are negative.      Objective:   Physical Exam Awake, alert, appropriate, accompanied by wife, NAD  R 3rd digit- Not TTP over joints, but is TTP with ROM-   MSK: LUE-  Deltoid 4/5; Biceps, triceps 5-/5; WE 5-/5; Grip 4+/5; and FA 5-/5 LLE- HF 4+/5; KE/KF 5-/5; DF 4+/5 (and cannot stand on heels as easily on L compared to R); PF 5-/5- slightly decreased on L PF when stands on toes   Can get 90 degrees of abduction and flexion of L shoulder  Neuro  Possible reduction in sensation slightly in L hand compared to face- C6-C8 Hoffman's (+)- very brisk on LUE Possible 2 beats clonus L wrist MAS of 1 in L wrist and elbow- L shoulder hard ot assess for tone due to frozen shoulder       Assessment & Plan:   Pt is a 58 year old male with Cervical myelopathy and L sided weakness s/p C3-6 ACDF; myofascial pain s/p trigger point injections, L shoulder pain- prior multifocal pneumonia-  Here for  f/u on Cervical myelopathy with  LUE weakness and L shoulder pain.  Call Dr Alfredo Ano- and tell then you are interested in doing surgical intervention to relax Frozen shoulder.   2. Can change Rx for Baclofen  to to take 20 mg 2x/day- - changed from 10 to 20 mg tab-    3. Con't Tramadol  as needed- but has plenty- since only takes max 2x/week Doesn't need refills right now   4. Con't Gabapentin - in AM- 300 mg-  working well- doesn't need refills- has 2 refills left   5.  Fatigue easier, and then drags more- on L side- if oyu go on hike, take a hiking pole.    6.  F/U in 3 months- have to do q3 months- since getting tramadol .    7. Discussed intimacy and spasticity  I spent a total of 31   minutes on total care today- >50% coordination of care- due to education on spasticity and d/w pt about options- changed meds- also educated on fall risk

## 2024-03-02 ENCOUNTER — Other Ambulatory Visit (HOSPITAL_COMMUNITY): Payer: Self-pay

## 2024-03-02 ENCOUNTER — Other Ambulatory Visit: Payer: Self-pay

## 2024-03-02 MED ORDER — ATORVASTATIN CALCIUM 20 MG PO TABS
20.0000 mg | ORAL_TABLET | Freq: Every day | ORAL | 1 refills | Status: AC
  Filled 2024-03-02 – 2024-04-20 (×2): qty 30, 30d supply, fill #0
  Filled 2024-05-25: qty 30, 30d supply, fill #1
  Filled 2024-06-24: qty 30, 30d supply, fill #2

## 2024-03-02 MED ORDER — METOPROLOL SUCCINATE ER 25 MG PO TB24
25.0000 mg | ORAL_TABLET | Freq: Every day | ORAL | 1 refills | Status: AC
  Filled 2024-03-02 – 2024-04-20 (×2): qty 30, 30d supply, fill #0

## 2024-03-02 MED ORDER — TERBINAFINE HCL 250 MG PO TABS
250.0000 mg | ORAL_TABLET | Freq: Every day | ORAL | 1 refills | Status: AC
  Filled 2024-03-02: qty 30, 30d supply, fill #0

## 2024-03-02 MED ORDER — OMEPRAZOLE 40 MG PO CPDR
40.0000 mg | DELAYED_RELEASE_CAPSULE | Freq: Every day | ORAL | 1 refills | Status: AC
  Filled 2024-03-02: qty 30, 30d supply, fill #0
  Filled 2024-03-29: qty 30, 30d supply, fill #1
  Filled 2024-05-04: qty 30, 30d supply, fill #2
  Filled 2024-06-05: qty 30, 30d supply, fill #3
  Filled 2024-07-05: qty 30, 30d supply, fill #4

## 2024-03-22 ENCOUNTER — Other Ambulatory Visit (HOSPITAL_COMMUNITY): Payer: Self-pay

## 2024-03-23 ENCOUNTER — Other Ambulatory Visit: Payer: Self-pay

## 2024-03-23 ENCOUNTER — Other Ambulatory Visit (HOSPITAL_COMMUNITY): Payer: Self-pay

## 2024-03-23 MED ORDER — METOPROLOL SUCCINATE ER 25 MG PO TB24
25.0000 mg | ORAL_TABLET | Freq: Every day | ORAL | 3 refills | Status: AC
  Filled 2024-03-23: qty 30, 30d supply, fill #0
  Filled 2024-04-20: qty 30, 30d supply, fill #1

## 2024-03-29 ENCOUNTER — Other Ambulatory Visit (HOSPITAL_COMMUNITY): Payer: Self-pay

## 2024-03-29 MED ORDER — TERBINAFINE HCL 250 MG PO TABS
250.0000 mg | ORAL_TABLET | Freq: Every day | ORAL | 3 refills | Status: AC
  Filled 2024-03-29: qty 30, 30d supply, fill #0
  Filled 2024-04-20 – 2024-05-04 (×2): qty 30, 30d supply, fill #1
  Filled 2024-06-05: qty 30, 30d supply, fill #2
  Filled 2024-07-05: qty 30, 30d supply, fill #3

## 2024-03-30 ENCOUNTER — Other Ambulatory Visit (HOSPITAL_COMMUNITY): Payer: Self-pay

## 2024-03-30 ENCOUNTER — Other Ambulatory Visit: Payer: Self-pay

## 2024-04-20 ENCOUNTER — Other Ambulatory Visit (HOSPITAL_COMMUNITY): Payer: Self-pay

## 2024-05-20 ENCOUNTER — Encounter: Payer: Self-pay | Admitting: Physical Medicine and Rehabilitation

## 2024-05-20 ENCOUNTER — Encounter: Attending: Physical Medicine and Rehabilitation | Admitting: Physical Medicine and Rehabilitation

## 2024-05-20 VITALS — BP 95/61 | HR 76 | Ht 72.0 in | Wt 188.6 lb

## 2024-05-20 DIAGNOSIS — M7502 Adhesive capsulitis of left shoulder: Secondary | ICD-10-CM | POA: Insufficient documentation

## 2024-05-20 DIAGNOSIS — R252 Cramp and spasm: Secondary | ICD-10-CM | POA: Diagnosis not present

## 2024-05-20 DIAGNOSIS — G959 Disease of spinal cord, unspecified: Secondary | ICD-10-CM | POA: Insufficient documentation

## 2024-05-20 NOTE — Patient Instructions (Addendum)
 Pt is a 59 year old male with Cervical myelopathy and L sided weakness s/p C3-6 ACDF; myofascial pain s/p trigger point injections, L shoulder pain- prior multifocal pneumonia-  Here for  f/u on Cervical myelopathy with LUE weakness and L shoulder pain.  LLE gets tired faster so because  2. Suggest hand warmer for pockets to work on hands-   3.  Suggest Foot up brace- esp when doing active tasks- for walking- in tennis shoes- could reduce his falls.   4. Not needing tramadol - so can stop  5. Off Gabapentin -   6. Use hiking pole if hiking.    7. F/U 6 months- on Cervical myelopathy  8. Con't Baclofen  20 mg 2x/day- for spasticity.

## 2024-05-20 NOTE — Progress Notes (Signed)
 Subjective:    Patient ID: Johnny Robinson, male    DOB: [birth year], 58 y.o.   MRN: [id number]  HPI  Pt is a 58 year old male with Cervical myelopathy and L sided weakness s/p C3-6 ACDF; myofascial pain s/p trigger point injections, L shoulder pain- prior multifocal pneumonia-  Here for  f/u on Cervical myelopathy with LUE weakness and L shoulder pain.  If sits in cold room, L hand claws up-  even when cloud came over- almost immediate.   Feet feel cold at night- socks helps a little warmer? Easier to sleep.    Stopped gabapentin - because increased risk of dementia.  Hasn't noticed any change to pain- is more of a numb thing not pain thing- feels 1/2 way asleep.    LLE is still dragging- esp if far distances-  wife walks behind him to make sure he picks up his foot.   Had on flip flops the other day and was dragging LLE-   Has 3 falls this year-  None  since last seen me- no other falls-     Spasticity-  when stretches, feels like a frog hopping- less than 30 seconds, when first wakes up or sits for prolonged in cold location- has tightness in L hand.   Has heated jacket.  Got a heated muff- heated inside.   Only took Tramadol  for PT-  Stopped PT b/c couldn't make the schedule- was so could do more at PT.   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 Limited movement for now.  Brother had surgery for frozen shoulder-   Children are 20/21  Pain Inventory Average Pain 3 Pain Right Now 1 My pain is constant and tingling  In the last 24 hours, has pain interfered with the following? General activity 1 Relation with others 0 Enjoyment of life 2 What TIME of day is your pain at its worst? night Sleep (in general) Fair  Pain is worse with: some activites Pain improves with: medication Relief from Meds: 5  Family History  Problem Relation Age of Onset   Nephrolithiasis Mother    Cancer Father    Social History   Socioeconomic History   Marital status: Married    Spouse  name: Johnny Robinson   Number of children: 3   Years of education: JD   Highest education level: Professional school degree (e.g., MD, DDS, DVM, JD)  Occupational History   Occupation: Clinical research associate  Tobacco Use   Smoking status: Never    Passive exposure: Never   Smokeless tobacco: Never  Vaping Use   Vaping status: Never Used  Substance and Sexual Activity   Alcohol use: Yes    Comment: occasional   Drug use: No   Sexual activity: Yes    Birth control/protection: None  Other Topics Concern   Not on file  Social History Narrative   Not on file   Social Drivers of Health   Financial Resource Strain: Low Risk  (07/04/2023)   Received from [facility]   Overall Financial Resource Strain (CARDIA)    Difficulty of Paying Living Expenses: Not hard at all  Food Insecurity: No Food Insecurity (07/04/2023)   Received from [facility]   Hunger Vital Sign    Worried About Running Out of Food in the Last Year: Never true    Ran Out of Food in the Last Year: Never true  Transportation Needs: No Transportation Needs (07/04/2023)   Received from [facility]  PRAPARE - Transportation    In the past 12 months, has lack of transportation kept you from medical appointments or from getting medications?: No    Lack of Transportation (Non-Medical): No  Physical Activity: Not on file  Stress: Not on file  Social Connections: Not on file   Past Surgical History:  Procedure Laterality Date   BACK SURGERY  11/2017   burr hole  1992   to evacuate [facility]   COLONOSCOPY WITH PROPOFOL  N/A 09/02/2018   Procedure: COLONOSCOPY WITH PROPOFOL ;  Surgeon: Jinny Carmine, MD;  Location: [facility] ENDOSCOPY;  Service: Endoscopy;  Laterality: N/A;   EXCISION MASS ABDOMINAL Left 05/28/2018   Procedure: EXCISIONAL BIOPSY MASS ABDOMINAL WALL;  Surgeon: Rodolph Romano, MD;  Location: [facility] ORS;  Service: General;  Laterality: Left;   PERIPHERAL VASCULAR CATHETERIZATION  Right 05/18/2015   Procedure: Renal Angiography;  Surgeon: Selinda GORMAN Gu, MD;  Location: [facility] INVASIVE CV LAB;  Service: Cardiovascular;  Laterality: Right;   PERIPHERAL VASCULAR CATHETERIZATION Bilateral 05/18/2015   Procedure: Renal Intervention;  Surgeon: Selinda GORMAN Gu, MD;  Location: [facility] INVASIVE CV LAB;  Service: Cardiovascular;  Laterality: Bilateral;   TOTAL HIP ARTHROPLASTY 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/2022   Procedure: TOTAL HIP ARTHROPLASTY ANTERIOR APPROACH;  Surgeon: Melodi Lerner, MD;  Location: [facility] ORS;  Service: Orthopedics;  Laterality: Left;   Past Surgical History:  Procedure Laterality Date   BACK SURGERY  11/2017   burr hole  1992   to evacuate [facility]   COLONOSCOPY WITH PROPOFOL  N/A 09/02/2018   Procedure: COLONOSCOPY WITH PROPOFOL ;  Surgeon: Jinny Carmine, MD;  Location: [facility] ENDOSCOPY;  Service: Endoscopy;  Laterality: N/A;   EXCISION MASS ABDOMINAL Left 05/28/2018   Procedure: EXCISIONAL BIOPSY MASS ABDOMINAL WALL;  Surgeon: Rodolph Romano, MD;  Location: [facility] ORS;  Service: General;  Laterality: Left;   PERIPHERAL VASCULAR CATHETERIZATION Right 05/18/2015   Procedure: Renal Angiography;  Surgeon: Selinda GORMAN Gu, MD;  Location: [facility] INVASIVE CV LAB;  Service: Cardiovascular;  Laterality: Right;   PERIPHERAL VASCULAR CATHETERIZATION Bilateral 05/18/2015   Procedure: Renal Intervention;  Surgeon: Selinda GORMAN Gu, MD;  Location: [facility] INVASIVE CV LAB;  Service: Cardiovascular;  Laterality: Bilateral;   TOTAL HIP ARTHROPLASTY Left 03/07/2022   Procedure: TOTAL HIP ARTHROPLASTY ANTERIOR APPROACH;  Surgeon: Melodi Lerner, MD;  Location: [facility] ORS;  Service: Orthopedics;  Laterality: Left;   Past Medical History:  Diagnosis Date   Arthritis    Chronic kidney disease 2016   RENAL INFARCT   GERD (gastroesophageal reflux disease)    Hypertension    Reflux    Subdural hematoma (HCC) 1991   BP 95/61   Pulse 76   Ht 6' (1.829 m)   Wt 188 lb 9.6 oz (85.5 kg)   SpO2 94%   BMI 25.58 kg/m   Opioid  Risk Score:   Fall Risk Score:  `1  Depression screen [facility] 2/9     02/05/2024    9:50 AM 11/08/2023    9:45 AM 07/01/2023    9:26 AM 04/01/2023   10:30 AM 12/31/2022   10:43 AM  Depression screen PH[facility] 2/9  Decreased Interest 0 0 0 0 0  Down, Depressed, Hopeless 0 0 0 0 0  PHQ - 2 Score 0 0 0 0 0  Altered sleeping 0    0  Tired, decreased energy 0    0  Change in appetite 0    0  Feeling bad or failure about yourself  0    0  Trouble concentrating 0  0  Moving slowly or fidgety/restless 0    0  Suicidal thoughts 0    0  PHQ-9 Score 0    0     Review of Systems  Musculoskeletal:        Left arm  All other systems reviewed and are negative.      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  LLE gets tired faster so because  2. Suggest hand warmer for pockets to work on hands-   3.  Suggest Foot up brace- esp when doing active tasks- for walking- in tennis shoes- could reduce his falls.   4. Not needing tramadol - so can stop  5. Off Gabapentin -   6. Use hiking pole if hiking.    7. F/U 6 months- on Cervical myelopathy  8. Con't Baclofen  20 mg 2x/day- for spasticity.    I spent a total of 32   minutes on total care today- >50% coordination of care- due to d/w pt about spasticity- as well as falls-  and how to avoid falls.

## 2024-06-04 ENCOUNTER — Other Ambulatory Visit (HOSPITAL_COMMUNITY): Payer: Self-pay

## 2024-06-15 ENCOUNTER — Other Ambulatory Visit (HOSPITAL_COMMUNITY): Payer: Self-pay

## 2024-07-24 ENCOUNTER — Other Ambulatory Visit (HOSPITAL_COMMUNITY): Payer: Self-pay

## 2024-07-24 ENCOUNTER — Other Ambulatory Visit: Payer: Self-pay

## 2024-07-24 MED ORDER — TERBINAFINE HCL 250 MG PO TABS
250.0000 mg | ORAL_TABLET | Freq: Every day | ORAL | 1 refills | Status: AC
  Filled 2024-07-24 – 2024-08-10 (×3): qty 30, 30d supply, fill #0
  Filled 2024-09-07: qty 30, 30d supply, fill #1
  Filled 2024-10-03: qty 30, 30d supply, fill #2

## 2024-07-24 MED ORDER — ATORVASTATIN CALCIUM 20 MG PO TABS
20.0000 mg | ORAL_TABLET | Freq: Every day | ORAL | 1 refills | Status: AC
  Filled 2024-07-24: qty 30, 30d supply, fill #0
  Filled 2024-08-25: qty 30, 30d supply, fill #1
  Filled 2024-09-22: qty 30, 30d supply, fill #2

## 2024-07-24 MED ORDER — METOPROLOL SUCCINATE ER 25 MG PO TB24
25.0000 mg | ORAL_TABLET | Freq: Every day | ORAL | 1 refills | Status: AC
  Filled 2024-07-24: qty 30, 30d supply, fill #0
  Filled 2024-08-25: qty 30, 30d supply, fill #1
  Filled 2024-09-22: qty 30, 30d supply, fill #2

## 2024-07-24 MED ORDER — OMEPRAZOLE 40 MG PO CPDR
40.0000 mg | DELAYED_RELEASE_CAPSULE | Freq: Every day | ORAL | 1 refills | Status: AC
  Filled 2024-07-24 – 2024-08-10 (×5): qty 30, 30d supply, fill #0
  Filled 2024-09-07: qty 30, 30d supply, fill #1
  Filled 2024-10-03: qty 30, 30d supply, fill #2

## 2024-07-28 ENCOUNTER — Other Ambulatory Visit: Payer: Self-pay

## 2024-07-28 ENCOUNTER — Other Ambulatory Visit (HOSPITAL_COMMUNITY): Payer: Self-pay

## 2024-08-08 ENCOUNTER — Other Ambulatory Visit (HOSPITAL_COMMUNITY): Payer: Self-pay

## 2024-08-10 ENCOUNTER — Other Ambulatory Visit (HOSPITAL_COMMUNITY): Payer: Self-pay

## 2024-10-05 ENCOUNTER — Other Ambulatory Visit (HOSPITAL_COMMUNITY): Payer: Self-pay

## 2024-11-16 ENCOUNTER — Encounter: Admitting: Physical Medicine and Rehabilitation

## 2024-11-16 ENCOUNTER — Ambulatory Visit: Admitting: Physical Medicine and Rehabilitation
